# Patient Record
Sex: Female | Born: 1945 | Race: White | Hispanic: Yes | Marital: Married | Smoking: Former smoker
Health system: Southern US, Community
[De-identification: ages and names within clinical notes are randomized; demographics above are authoritative.]

## PROBLEM LIST (undated history)

## (undated) DIAGNOSIS — S92902A Unspecified fracture of left foot, initial encounter for closed fracture: Secondary | ICD-10-CM

## (undated) DIAGNOSIS — E785 Hyperlipidemia, unspecified: Secondary | ICD-10-CM

## (undated) DIAGNOSIS — M1990 Unspecified osteoarthritis, unspecified site: Secondary | ICD-10-CM

## (undated) DIAGNOSIS — K900 Celiac disease: Secondary | ICD-10-CM

## (undated) DIAGNOSIS — M8580 Other specified disorders of bone density and structure, unspecified site: Secondary | ICD-10-CM

## (undated) DIAGNOSIS — K219 Gastro-esophageal reflux disease without esophagitis: Secondary | ICD-10-CM

## (undated) DIAGNOSIS — I739 Peripheral vascular disease, unspecified: Secondary | ICD-10-CM

## (undated) DIAGNOSIS — B009 Herpesviral infection, unspecified: Secondary | ICD-10-CM

## (undated) DIAGNOSIS — I10 Essential (primary) hypertension: Secondary | ICD-10-CM

## (undated) DIAGNOSIS — R7303 Prediabetes: Secondary | ICD-10-CM

## (undated) DIAGNOSIS — F419 Anxiety disorder, unspecified: Secondary | ICD-10-CM

## (undated) DIAGNOSIS — I2510 Atherosclerotic heart disease of native coronary artery without angina pectoris: Secondary | ICD-10-CM

## (undated) DIAGNOSIS — B029 Zoster without complications: Secondary | ICD-10-CM

## (undated) HISTORY — DX: Gastro-esophageal reflux disease without esophagitis: K21.9

## (undated) HISTORY — DX: Celiac disease: K90.0

## (undated) HISTORY — DX: Essential (primary) hypertension: I10

## (undated) HISTORY — PX: ESOPHAGOGASTRODUODENOSCOPY: SHX1529

## (undated) HISTORY — DX: Unspecified fracture of left foot, initial encounter for closed fracture: S92.902A

## (undated) HISTORY — DX: Hyperlipidemia, unspecified: E78.5

## (undated) HISTORY — PX: BREAST SURGERY: SHX581

## (undated) HISTORY — DX: Anxiety disorder, unspecified: F41.9

## (undated) HISTORY — DX: Atherosclerotic heart disease of native coronary artery without angina pectoris: I25.10

## (undated) HISTORY — PX: ROTATOR CUFF REPAIR: SHX139

## (undated) HISTORY — PX: APPENDECTOMY: SHX54

## (undated) HISTORY — DX: Herpesviral infection, unspecified: B00.9

## (undated) HISTORY — DX: Peripheral vascular disease, unspecified: I73.9

## (undated) HISTORY — DX: Other specified disorders of bone density and structure, unspecified site: M85.80

## (undated) HISTORY — DX: Zoster without complications: B02.9

---

## 1991-08-30 HISTORY — PX: VAGINAL HYSTERECTOMY: SUR661

## 1998-11-17 ENCOUNTER — Other Ambulatory Visit: Admission: RE | Admit: 1998-11-17 | Discharge: 1998-11-17 | Payer: Self-pay | Admitting: Obstetrics and Gynecology

## 2000-04-18 HISTORY — PX: OTHER SURGICAL HISTORY: SHX169

## 2001-01-23 ENCOUNTER — Encounter: Admission: RE | Admit: 2001-01-23 | Discharge: 2001-01-23 | Payer: Self-pay | Admitting: Orthopedic Surgery

## 2001-01-23 ENCOUNTER — Encounter: Payer: Self-pay | Admitting: Orthopedic Surgery

## 2001-05-21 ENCOUNTER — Ambulatory Visit (HOSPITAL_COMMUNITY): Admission: RE | Admit: 2001-05-21 | Discharge: 2001-05-21 | Payer: Self-pay | Admitting: Internal Medicine

## 2001-07-13 ENCOUNTER — Encounter: Payer: Self-pay | Admitting: Orthopedic Surgery

## 2001-07-20 ENCOUNTER — Observation Stay (HOSPITAL_COMMUNITY): Admission: RE | Admit: 2001-07-20 | Discharge: 2001-07-21 | Payer: Self-pay | Admitting: Orthopedic Surgery

## 2001-11-28 ENCOUNTER — Other Ambulatory Visit: Admission: RE | Admit: 2001-11-28 | Discharge: 2001-11-28 | Payer: Self-pay | Admitting: Obstetrics and Gynecology

## 2002-10-04 ENCOUNTER — Emergency Department (HOSPITAL_COMMUNITY): Admission: EM | Admit: 2002-10-04 | Discharge: 2002-10-04 | Payer: Self-pay | Admitting: Emergency Medicine

## 2002-12-11 ENCOUNTER — Other Ambulatory Visit: Admission: RE | Admit: 2002-12-11 | Discharge: 2002-12-11 | Payer: Self-pay | Admitting: Obstetrics and Gynecology

## 2003-11-03 ENCOUNTER — Other Ambulatory Visit: Admission: RE | Admit: 2003-11-03 | Discharge: 2003-11-03 | Payer: Self-pay | Admitting: Obstetrics and Gynecology

## 2003-12-15 ENCOUNTER — Ambulatory Visit (HOSPITAL_COMMUNITY): Admission: RE | Admit: 2003-12-15 | Discharge: 2003-12-15 | Payer: Self-pay | Admitting: Internal Medicine

## 2004-07-13 ENCOUNTER — Ambulatory Visit: Payer: Self-pay

## 2004-07-19 ENCOUNTER — Ambulatory Visit: Payer: Self-pay | Admitting: Internal Medicine

## 2004-07-26 ENCOUNTER — Ambulatory Visit: Payer: Self-pay | Admitting: Internal Medicine

## 2004-07-30 ENCOUNTER — Ambulatory Visit: Payer: Self-pay | Admitting: Internal Medicine

## 2004-09-01 ENCOUNTER — Ambulatory Visit: Payer: Self-pay | Admitting: Internal Medicine

## 2004-09-22 ENCOUNTER — Ambulatory Visit: Payer: Self-pay | Admitting: Internal Medicine

## 2004-09-23 ENCOUNTER — Ambulatory Visit: Payer: Self-pay | Admitting: Internal Medicine

## 2004-09-27 ENCOUNTER — Ambulatory Visit: Payer: Self-pay | Admitting: Psychology

## 2004-10-07 ENCOUNTER — Ambulatory Visit: Payer: Self-pay | Admitting: Psychology

## 2004-10-14 ENCOUNTER — Ambulatory Visit: Payer: Self-pay | Admitting: Psychology

## 2004-10-28 ENCOUNTER — Ambulatory Visit: Payer: Self-pay | Admitting: Internal Medicine

## 2004-11-04 ENCOUNTER — Ambulatory Visit: Payer: Self-pay | Admitting: Psychology

## 2004-11-09 ENCOUNTER — Other Ambulatory Visit: Admission: RE | Admit: 2004-11-09 | Discharge: 2004-11-09 | Payer: Self-pay | Admitting: Obstetrics and Gynecology

## 2004-11-15 ENCOUNTER — Ambulatory Visit: Payer: Self-pay | Admitting: Psychology

## 2004-11-17 ENCOUNTER — Ambulatory Visit: Payer: Self-pay | Admitting: Internal Medicine

## 2004-11-26 ENCOUNTER — Ambulatory Visit: Payer: Self-pay | Admitting: Internal Medicine

## 2004-12-20 ENCOUNTER — Ambulatory Visit: Payer: Self-pay | Admitting: Psychology

## 2004-12-22 ENCOUNTER — Ambulatory Visit: Payer: Self-pay | Admitting: Internal Medicine

## 2005-01-10 ENCOUNTER — Ambulatory Visit: Payer: Self-pay

## 2005-01-20 ENCOUNTER — Ambulatory Visit: Payer: Self-pay | Admitting: Internal Medicine

## 2005-02-25 ENCOUNTER — Ambulatory Visit: Payer: Self-pay | Admitting: Internal Medicine

## 2005-03-07 ENCOUNTER — Ambulatory Visit: Payer: Self-pay | Admitting: Psychology

## 2005-03-30 ENCOUNTER — Ambulatory Visit: Payer: Self-pay | Admitting: Internal Medicine

## 2005-05-30 ENCOUNTER — Ambulatory Visit: Payer: Self-pay | Admitting: Internal Medicine

## 2005-07-04 ENCOUNTER — Ambulatory Visit: Payer: Self-pay

## 2005-07-26 ENCOUNTER — Ambulatory Visit: Payer: Self-pay | Admitting: Internal Medicine

## 2005-07-28 ENCOUNTER — Ambulatory Visit: Payer: Self-pay | Admitting: Internal Medicine

## 2005-08-01 ENCOUNTER — Ambulatory Visit: Payer: Self-pay | Admitting: Internal Medicine

## 2005-08-29 DIAGNOSIS — K900 Celiac disease: Secondary | ICD-10-CM

## 2005-08-29 HISTORY — DX: Celiac disease: K90.0

## 2005-10-31 ENCOUNTER — Ambulatory Visit: Payer: Self-pay | Admitting: Internal Medicine

## 2005-11-17 ENCOUNTER — Other Ambulatory Visit: Admission: RE | Admit: 2005-11-17 | Discharge: 2005-11-17 | Payer: Self-pay | Admitting: Obstetrics and Gynecology

## 2005-12-08 ENCOUNTER — Ambulatory Visit: Payer: Self-pay | Admitting: Endocrinology

## 2006-01-09 ENCOUNTER — Ambulatory Visit: Payer: Self-pay | Admitting: Internal Medicine

## 2006-01-13 ENCOUNTER — Ambulatory Visit: Payer: Self-pay | Admitting: Internal Medicine

## 2006-01-24 ENCOUNTER — Ambulatory Visit: Payer: Self-pay | Admitting: Internal Medicine

## 2006-01-25 ENCOUNTER — Ambulatory Visit (HOSPITAL_COMMUNITY): Admission: RE | Admit: 2006-01-25 | Discharge: 2006-01-25 | Payer: Self-pay | Admitting: Internal Medicine

## 2006-01-25 ENCOUNTER — Encounter: Payer: Self-pay | Admitting: Internal Medicine

## 2006-01-31 ENCOUNTER — Ambulatory Visit: Payer: Self-pay | Admitting: Internal Medicine

## 2006-02-22 ENCOUNTER — Ambulatory Visit: Payer: Self-pay | Admitting: Internal Medicine

## 2006-03-03 ENCOUNTER — Ambulatory Visit: Payer: Self-pay | Admitting: Endocrinology

## 2006-04-13 ENCOUNTER — Ambulatory Visit: Payer: Self-pay | Admitting: Internal Medicine

## 2006-04-19 ENCOUNTER — Ambulatory Visit: Payer: Self-pay | Admitting: Internal Medicine

## 2006-04-21 ENCOUNTER — Encounter: Payer: Self-pay | Admitting: Internal Medicine

## 2006-04-21 ENCOUNTER — Ambulatory Visit (HOSPITAL_COMMUNITY): Admission: RE | Admit: 2006-04-21 | Discharge: 2006-04-21 | Payer: Self-pay | Admitting: Internal Medicine

## 2006-05-10 ENCOUNTER — Ambulatory Visit: Payer: Self-pay | Admitting: Internal Medicine

## 2006-05-15 ENCOUNTER — Ambulatory Visit: Payer: Self-pay | Admitting: Internal Medicine

## 2006-05-29 ENCOUNTER — Ambulatory Visit: Payer: Self-pay | Admitting: Internal Medicine

## 2006-06-13 ENCOUNTER — Ambulatory Visit: Payer: Self-pay

## 2006-08-02 ENCOUNTER — Ambulatory Visit: Payer: Self-pay | Admitting: Internal Medicine

## 2006-08-02 LAB — CONVERTED CEMR LAB
Alkaline Phosphatase: 79 units/L (ref 39–117)
Amylase: 134 units/L — ABNORMAL HIGH (ref 27–131)
BUN: 13 mg/dL (ref 6–23)
CO2: 30 meq/L (ref 19–32)
Calcium: 9.7 mg/dL (ref 8.4–10.5)
Chloride: 105 meq/L (ref 96–112)
Cholesterol: 253 mg/dL (ref 0–200)
Creatinine, Ser: 0.9 mg/dL (ref 0.4–1.2)
GFR calc non Af Amer: 68 mL/min
HDL: 57.3 mg/dL (ref >39.0)
Hgb A1c MFr Bld: 5.7 % (ref 4.6–6.0)
TSH: 1.48 microintl units/mL (ref 0.35–5.50)
Total Protein: 7 g/dL (ref 6.0–8.3)
Triglyceride fasting, serum: 131 mg/dL (ref 0–149)

## 2006-08-07 ENCOUNTER — Ambulatory Visit: Payer: Self-pay | Admitting: Internal Medicine

## 2006-09-15 ENCOUNTER — Ambulatory Visit: Payer: Self-pay | Admitting: Internal Medicine

## 2006-10-27 ENCOUNTER — Ambulatory Visit: Payer: Self-pay | Admitting: Internal Medicine

## 2006-10-27 LAB — CONVERTED CEMR LAB
BUN: 13 mg/dL (ref 6–23)
Creatinine, Ser: 0.7 mg/dL (ref 0.4–1.2)
Glucose, Bld: 94 mg/dL (ref 70–99)
Potassium: 4.4 meq/L (ref 3.5–5.1)
Total CHOL/HDL Ratio: 3.6

## 2006-11-06 ENCOUNTER — Ambulatory Visit: Payer: Self-pay | Admitting: Internal Medicine

## 2006-11-20 ENCOUNTER — Other Ambulatory Visit: Admission: RE | Admit: 2006-11-20 | Discharge: 2006-11-20 | Payer: Self-pay | Admitting: Obstetrics and Gynecology

## 2006-11-24 ENCOUNTER — Ambulatory Visit: Payer: Self-pay | Admitting: Internal Medicine

## 2006-12-13 ENCOUNTER — Ambulatory Visit: Payer: Self-pay | Admitting: Internal Medicine

## 2007-01-24 ENCOUNTER — Ambulatory Visit: Payer: Self-pay | Admitting: Internal Medicine

## 2007-02-01 ENCOUNTER — Ambulatory Visit: Payer: Self-pay | Admitting: Internal Medicine

## 2007-02-01 LAB — CONVERTED CEMR LAB
AST: 35 units/L (ref 0–37)
Albumin: 3.6 g/dL (ref 3.5–5.2)
Amylase: 128 units/L (ref 27–131)
Glucose, Bld: 97 mg/dL (ref 70–99)
Lipase: 26 units/L (ref 11.0–59.0)
Total CHOL/HDL Ratio: 4.8
Triglycerides: 124 mg/dL (ref 0–149)
VLDL: 25 mg/dL (ref 0–40)
Vit D, 1,25-Dihydroxy: 50 (ref 20–57)

## 2007-02-05 ENCOUNTER — Ambulatory Visit: Payer: Self-pay | Admitting: Internal Medicine

## 2007-02-06 ENCOUNTER — Ambulatory Visit: Payer: Self-pay | Admitting: Internal Medicine

## 2007-02-12 ENCOUNTER — Encounter: Payer: Self-pay | Admitting: Internal Medicine

## 2007-02-12 ENCOUNTER — Ambulatory Visit (HOSPITAL_COMMUNITY): Admission: RE | Admit: 2007-02-12 | Discharge: 2007-02-12 | Payer: Self-pay | Admitting: Internal Medicine

## 2007-02-12 LAB — HM COLONOSCOPY

## 2007-02-19 ENCOUNTER — Ambulatory Visit: Payer: Self-pay | Admitting: Internal Medicine

## 2007-02-23 ENCOUNTER — Ambulatory Visit: Payer: Self-pay | Admitting: Internal Medicine

## 2007-03-24 ENCOUNTER — Encounter: Payer: Self-pay | Admitting: Internal Medicine

## 2007-03-24 DIAGNOSIS — I10 Essential (primary) hypertension: Secondary | ICD-10-CM

## 2007-04-15 ENCOUNTER — Emergency Department (HOSPITAL_COMMUNITY): Admission: EM | Admit: 2007-04-15 | Discharge: 2007-04-15 | Payer: Self-pay | Admitting: Emergency Medicine

## 2007-04-21 ENCOUNTER — Ambulatory Visit: Payer: Self-pay | Admitting: Internal Medicine

## 2007-05-11 ENCOUNTER — Ambulatory Visit: Payer: Self-pay | Admitting: Internal Medicine

## 2007-05-11 LAB — CONVERTED CEMR LAB
ALT: 42 units/L — ABNORMAL HIGH (ref 0–35)
Alkaline Phosphatase: 104 units/L (ref 39–117)
Total Bilirubin: 0.7 mg/dL (ref 0.3–1.2)
Total Protein: 6.9 g/dL (ref 6.0–8.3)

## 2007-05-16 ENCOUNTER — Ambulatory Visit: Payer: Self-pay | Admitting: Internal Medicine

## 2007-07-20 ENCOUNTER — Telehealth: Payer: Self-pay | Admitting: Internal Medicine

## 2007-08-08 ENCOUNTER — Ambulatory Visit: Payer: Self-pay | Admitting: Internal Medicine

## 2007-08-08 LAB — CONVERTED CEMR LAB
ALT: 21 units/L (ref 0–35)
Direct LDL: 136 mg/dL
VLDL: 23 mg/dL (ref 0–40)

## 2007-08-15 ENCOUNTER — Ambulatory Visit: Payer: Self-pay | Admitting: Internal Medicine

## 2007-08-15 DIAGNOSIS — I739 Peripheral vascular disease, unspecified: Secondary | ICD-10-CM

## 2007-08-15 DIAGNOSIS — E559 Vitamin D deficiency, unspecified: Secondary | ICD-10-CM

## 2007-09-26 ENCOUNTER — Encounter: Payer: Self-pay | Admitting: Internal Medicine

## 2007-11-21 ENCOUNTER — Other Ambulatory Visit: Admission: RE | Admit: 2007-11-21 | Discharge: 2007-11-21 | Payer: Self-pay | Admitting: Obstetrics and Gynecology

## 2007-12-06 ENCOUNTER — Ambulatory Visit: Payer: Self-pay | Admitting: Internal Medicine

## 2007-12-06 LAB — CONVERTED CEMR LAB
BUN: 14 mg/dL (ref 6–23)
Chloride: 108 meq/L (ref 96–112)
Cholesterol: 163 mg/dL (ref 0–200)
GFR calc non Af Amer: 78 mL/min
Glucose, Bld: 105 mg/dL — ABNORMAL HIGH (ref 70–99)
LDL Cholesterol: 92 mg/dL (ref 0–99)
Potassium: 4.2 meq/L (ref 3.5–5.1)

## 2007-12-11 ENCOUNTER — Ambulatory Visit: Payer: Self-pay | Admitting: Internal Medicine

## 2007-12-11 DIAGNOSIS — J309 Allergic rhinitis, unspecified: Secondary | ICD-10-CM

## 2008-02-12 ENCOUNTER — Telehealth: Payer: Self-pay | Admitting: Internal Medicine

## 2008-03-28 ENCOUNTER — Ambulatory Visit: Payer: Self-pay | Admitting: Internal Medicine

## 2008-03-28 LAB — CONVERTED CEMR LAB
Albumin: 4.1 g/dL (ref 3.5–5.2)
Basophils Absolute: 0 10*3/uL (ref 0.0–0.1)
Bilirubin Urine: NEGATIVE
Bilirubin, Direct: 0.1 mg/dL (ref 0.0–0.3)
Calcium: 9.8 mg/dL (ref 8.4–10.5)
Creatinine, Ser: 0.9 mg/dL (ref 0.4–1.2)
Eosinophils Absolute: 0.1 10*3/uL (ref 0.0–0.7)
GFR calc Af Amer: 82 mL/min
GFR calc non Af Amer: 68 mL/min
HCT: 40.9 % (ref 36.0–46.0)
HDL: 53.5 mg/dL (ref >39.0)
Hemoglobin: 14 g/dL (ref 12.0–15.0)
Ketones, ur: NEGATIVE mg/dL
Leukocytes, UA: NEGATIVE
Lymphocytes Relative: 42.1 % (ref 12.0–46.0)
MCHC: 34.2 g/dL (ref 30.0–36.0)
MCV: 89.7 fL (ref 78.0–100.0)
Monocytes Absolute: 0.3 10*3/uL (ref 0.1–1.0)
Neutro Abs: 2 10*3/uL (ref 1.4–7.7)
RDW: 12.4 % (ref 11.5–14.6)
Sodium: 141 meq/L (ref 135–145)
Total Bilirubin: 0.8 mg/dL (ref 0.3–1.2)
Total CHOL/HDL Ratio: 3.3
Total Protein: 6.9 g/dL (ref 6.0–8.3)
Triglycerides: 108 mg/dL (ref 0–149)
Urine Glucose: NEGATIVE mg/dL
pH: 7 (ref 5.0–8.0)

## 2008-04-02 ENCOUNTER — Ambulatory Visit: Payer: Self-pay | Admitting: Internal Medicine

## 2008-04-02 DIAGNOSIS — K219 Gastro-esophageal reflux disease without esophagitis: Secondary | ICD-10-CM

## 2008-04-23 ENCOUNTER — Ambulatory Visit: Payer: Self-pay | Admitting: Internal Medicine

## 2008-04-23 DIAGNOSIS — E785 Hyperlipidemia, unspecified: Secondary | ICD-10-CM | POA: Insufficient documentation

## 2008-04-23 DIAGNOSIS — Z860100 Personal history of colon polyps, unspecified: Secondary | ICD-10-CM | POA: Insufficient documentation

## 2008-04-23 DIAGNOSIS — K5730 Diverticulosis of large intestine without perforation or abscess without bleeding: Secondary | ICD-10-CM | POA: Insufficient documentation

## 2008-04-23 DIAGNOSIS — F411 Generalized anxiety disorder: Secondary | ICD-10-CM

## 2008-05-13 ENCOUNTER — Ambulatory Visit: Payer: Self-pay | Admitting: Obstetrics and Gynecology

## 2008-06-02 ENCOUNTER — Ambulatory Visit: Payer: Self-pay | Admitting: Internal Medicine

## 2008-06-02 DIAGNOSIS — R21 Rash and other nonspecific skin eruption: Secondary | ICD-10-CM

## 2008-06-03 ENCOUNTER — Encounter: Payer: Self-pay | Admitting: Internal Medicine

## 2008-06-09 ENCOUNTER — Telehealth: Payer: Self-pay | Admitting: Internal Medicine

## 2008-06-27 DIAGNOSIS — Z8719 Personal history of other diseases of the digestive system: Secondary | ICD-10-CM

## 2008-06-27 DIAGNOSIS — K589 Irritable bowel syndrome without diarrhea: Secondary | ICD-10-CM | POA: Insufficient documentation

## 2008-07-02 ENCOUNTER — Ambulatory Visit: Payer: Self-pay | Admitting: Internal Medicine

## 2008-07-09 ENCOUNTER — Ambulatory Visit: Payer: Self-pay | Admitting: Internal Medicine

## 2008-09-01 ENCOUNTER — Telehealth: Payer: Self-pay | Admitting: Internal Medicine

## 2008-09-11 ENCOUNTER — Telehealth: Payer: Self-pay | Admitting: Internal Medicine

## 2008-09-26 ENCOUNTER — Telehealth: Payer: Self-pay | Admitting: Internal Medicine

## 2008-10-03 ENCOUNTER — Telehealth: Payer: Self-pay | Admitting: Internal Medicine

## 2008-10-14 ENCOUNTER — Encounter: Payer: Self-pay | Admitting: Internal Medicine

## 2008-11-05 ENCOUNTER — Ambulatory Visit: Payer: Self-pay | Admitting: Internal Medicine

## 2008-11-05 DIAGNOSIS — M545 Low back pain: Secondary | ICD-10-CM

## 2008-11-05 LAB — CONVERTED CEMR LAB
Albumin: 4.2 g/dL (ref 3.5–5.2)
BUN: 11 mg/dL (ref 6–23)
Basophils Absolute: 0 10*3/uL (ref 0.0–0.1)
Basophils Relative: 0.7 % (ref 0.0–3.0)
Bilirubin Urine: NEGATIVE
Creatinine, Ser: 0.7 mg/dL (ref 0.4–1.2)
Crystals: NEGATIVE
Eosinophils Absolute: 0.1 10*3/uL (ref 0.0–0.7)
GFR calc Af Amer: 109 mL/min
GFR calc non Af Amer: 90 mL/min
Ketones, ur: NEGATIVE mg/dL
Ketones, urine, test strip: NEGATIVE
Leukocytes, UA: NEGATIVE
MCHC: 34.6 g/dL (ref 30.0–36.0)
MCV: 89 fL (ref 78.0–100.0)
Neutro Abs: 2.4 10*3/uL (ref 1.4–7.7)
Neutrophils Relative %: 47.1 % (ref 43.0–77.0)
Nitrite: NEGATIVE
RBC: 4.51 M/uL (ref 3.87–5.11)
Sed Rate: 11 mm/hr (ref 0–22)
Specific Gravity, Urine: <=1.005 (ref 1.000–1.035)
Urobilinogen, UA: 0.2
Urobilinogen, UA: 0.2 (ref 0.0–1.0)
WBC Urine, dipstick: NEGATIVE

## 2008-11-06 ENCOUNTER — Encounter: Payer: Self-pay | Admitting: Internal Medicine

## 2008-11-11 ENCOUNTER — Telehealth: Payer: Self-pay | Admitting: Internal Medicine

## 2008-11-27 ENCOUNTER — Ambulatory Visit: Payer: Self-pay | Admitting: Internal Medicine

## 2008-12-23 ENCOUNTER — Ambulatory Visit: Payer: Self-pay | Admitting: Obstetrics and Gynecology

## 2008-12-23 ENCOUNTER — Encounter: Payer: Self-pay | Admitting: Obstetrics and Gynecology

## 2008-12-23 ENCOUNTER — Other Ambulatory Visit: Admission: RE | Admit: 2008-12-23 | Discharge: 2008-12-23 | Payer: Self-pay | Admitting: Obstetrics and Gynecology

## 2009-01-14 ENCOUNTER — Telehealth: Payer: Self-pay | Admitting: Internal Medicine

## 2009-01-15 ENCOUNTER — Ambulatory Visit: Payer: Self-pay | Admitting: Endocrinology

## 2009-03-06 ENCOUNTER — Ambulatory Visit: Payer: Self-pay | Admitting: Internal Medicine

## 2009-03-06 LAB — CONVERTED CEMR LAB
Albumin: 4.1 g/dL (ref 3.5–5.2)
BUN: 14 mg/dL (ref 6–23)
Basophils Relative: 0.1 % (ref 0.0–3.0)
Bilirubin Urine: NEGATIVE
Bilirubin, Direct: 0.1 mg/dL (ref 0.0–0.3)
Cholesterol: 170 mg/dL (ref 0–200)
Eosinophils Absolute: 0.1 10*3/uL (ref 0.0–0.7)
GFR calc non Af Amer: 67.35 mL/min (ref >60)
HCT: 40.1 % (ref 36.0–46.0)
HDL: 55.1 mg/dL (ref >39.00)
Leukocytes, UA: NEGATIVE
Lymphs Abs: 2 10*3/uL (ref 0.7–4.0)
MCHC: 33.9 g/dL (ref 30.0–36.0)
MCV: 90.9 fL (ref 78.0–100.0)
Monocytes Absolute: 0.4 10*3/uL (ref 0.1–1.0)
Neutro Abs: 1.9 10*3/uL (ref 1.4–7.7)
Neutrophils Relative %: 43.3 % (ref 43.0–77.0)
Nitrite: NEGATIVE
Potassium: 4.3 meq/L (ref 3.5–5.1)
RBC: 4.41 M/uL (ref 3.87–5.11)
Sodium: 144 meq/L (ref 135–145)
TSH: 1.65 microintl units/mL (ref 0.35–5.50)
Total Protein: 7 g/dL (ref 6.0–8.3)
Urobilinogen, UA: 0.2 (ref 0.0–1.0)
VLDL: 25 mg/dL (ref 0.0–40.0)

## 2009-03-12 ENCOUNTER — Ambulatory Visit: Payer: Self-pay | Admitting: Internal Medicine

## 2009-04-13 ENCOUNTER — Telehealth: Payer: Self-pay | Admitting: Internal Medicine

## 2009-04-27 ENCOUNTER — Telehealth: Payer: Self-pay | Admitting: Internal Medicine

## 2009-04-28 ENCOUNTER — Ambulatory Visit: Payer: Self-pay | Admitting: Internal Medicine

## 2009-05-26 ENCOUNTER — Ambulatory Visit: Payer: Self-pay | Admitting: Internal Medicine

## 2009-05-26 DIAGNOSIS — M2550 Pain in unspecified joint: Secondary | ICD-10-CM

## 2009-07-28 ENCOUNTER — Ambulatory Visit: Payer: Self-pay | Admitting: Internal Medicine

## 2009-07-28 DIAGNOSIS — Z87891 Personal history of nicotine dependence: Secondary | ICD-10-CM

## 2009-07-28 DIAGNOSIS — R42 Dizziness and giddiness: Secondary | ICD-10-CM

## 2009-08-29 DIAGNOSIS — S92902A Unspecified fracture of left foot, initial encounter for closed fracture: Secondary | ICD-10-CM

## 2009-08-29 HISTORY — DX: Unspecified fracture of left foot, initial encounter for closed fracture: S92.902A

## 2009-09-15 ENCOUNTER — Ambulatory Visit: Payer: Self-pay | Admitting: Internal Medicine

## 2009-09-15 LAB — CONVERTED CEMR LAB
Alkaline Phosphatase: 65 units/L (ref 39–117)
Basophils Absolute: 0 10*3/uL (ref 0.0–0.1)
Bilirubin, Direct: 0.1 mg/dL (ref 0.0–0.3)
Calcium: 9.6 mg/dL (ref 8.4–10.5)
Creatinine, Ser: 0.8 mg/dL (ref 0.4–1.2)
Eosinophils Relative: 1.4 % (ref 0.0–5.0)
GFR calc non Af Amer: 77.02 mL/min (ref >60)
HCT: 41.1 % (ref 36.0–46.0)
HDL: 56.4 mg/dL (ref >39.00)
Lymphs Abs: 1.8 10*3/uL (ref 0.7–4.0)
MCV: 91.8 fL (ref 78.0–100.0)
Monocytes Absolute: 0.4 10*3/uL (ref 0.1–1.0)
Monocytes Relative: 9.3 % (ref 3.0–12.0)
Neutrophils Relative %: 41.5 % — ABNORMAL LOW (ref 43.0–77.0)
Platelets: 244 10*3/uL (ref 150.0–400.0)
RDW: 12.2 % (ref 11.5–14.6)
Sodium: 143 meq/L (ref 135–145)
Total CHOL/HDL Ratio: 3
Triglycerides: 125 mg/dL (ref 0.0–149.0)
VLDL: 25 mg/dL (ref 0.0–40.0)
WBC: 3.9 10*3/uL — ABNORMAL LOW (ref 4.5–10.5)

## 2009-09-21 ENCOUNTER — Ambulatory Visit: Payer: Self-pay | Admitting: Internal Medicine

## 2009-09-22 ENCOUNTER — Telehealth: Payer: Self-pay | Admitting: Internal Medicine

## 2009-10-19 ENCOUNTER — Telehealth: Payer: Self-pay | Admitting: Internal Medicine

## 2009-10-21 ENCOUNTER — Encounter: Payer: Self-pay | Admitting: Internal Medicine

## 2009-10-23 ENCOUNTER — Telehealth: Payer: Self-pay | Admitting: Internal Medicine

## 2009-10-26 ENCOUNTER — Telehealth: Payer: Self-pay | Admitting: Internal Medicine

## 2009-10-27 ENCOUNTER — Ambulatory Visit: Payer: Self-pay | Admitting: Internal Medicine

## 2009-10-27 DIAGNOSIS — R197 Diarrhea, unspecified: Secondary | ICD-10-CM

## 2009-10-27 LAB — CONVERTED CEMR LAB
ALT: 22 units/L (ref 0–35)
AST: 23 units/L (ref 0–37)
Albumin: 4.2 g/dL (ref 3.5–5.2)
Alkaline Phosphatase: 74 units/L (ref 39–117)
Basophils Absolute: 0 10*3/uL (ref 0.0–0.1)
Basophils Relative: 0.3 % (ref 0.0–3.0)
Calcium: 9.5 mg/dL (ref 8.4–10.5)
Eosinophils Relative: 0.9 % (ref 0.0–5.0)
GFR calc non Af Amer: 89.82 mL/min (ref >60)
Glucose, Bld: 100 mg/dL — ABNORMAL HIGH (ref 70–99)
HCT: 41 % (ref 36.0–46.0)
Hemoglobin: 13.4 g/dL (ref 12.0–15.0)
Lymphocytes Relative: 29.7 % (ref 12.0–46.0)
Lymphs Abs: 1.2 10*3/uL (ref 0.7–4.0)
Monocytes Relative: 9.9 % (ref 3.0–12.0)
Neutro Abs: 2.6 10*3/uL (ref 1.4–7.7)
Potassium: 3.5 meq/L (ref 3.5–5.1)
RBC: 4.46 M/uL (ref 3.87–5.11)
Sed Rate: 12 mm/hr (ref 0–22)
Sodium: 138 meq/L (ref 135–145)
Total Protein: 7.7 g/dL (ref 6.0–8.3)

## 2009-10-28 ENCOUNTER — Telehealth: Payer: Self-pay | Admitting: Internal Medicine

## 2009-11-20 ENCOUNTER — Telehealth: Payer: Self-pay | Admitting: Internal Medicine

## 2009-11-23 ENCOUNTER — Ambulatory Visit: Payer: Self-pay | Admitting: Internal Medicine

## 2009-12-09 ENCOUNTER — Encounter: Payer: Self-pay | Admitting: Internal Medicine

## 2009-12-16 ENCOUNTER — Telehealth: Payer: Self-pay | Admitting: Internal Medicine

## 2009-12-24 ENCOUNTER — Other Ambulatory Visit: Admission: RE | Admit: 2009-12-24 | Discharge: 2009-12-24 | Payer: Self-pay | Admitting: Obstetrics and Gynecology

## 2009-12-24 ENCOUNTER — Ambulatory Visit: Payer: Self-pay | Admitting: Obstetrics and Gynecology

## 2010-03-16 ENCOUNTER — Ambulatory Visit: Payer: Self-pay | Admitting: Internal Medicine

## 2010-03-16 LAB — CONVERTED CEMR LAB
ALT: 23 units/L (ref 0–35)
Alkaline Phosphatase: 81 units/L (ref 39–117)
BUN: 16 mg/dL (ref 6–23)
Bilirubin, Direct: 0.1 mg/dL (ref 0.0–0.3)
Creatinine, Ser: 0.7 mg/dL (ref 0.4–1.2)
GFR calc non Af Amer: 88.25 mL/min (ref >60)
Glucose, Bld: 96 mg/dL (ref 70–99)
Potassium: 3.8 meq/L (ref 3.5–5.1)
Total Bilirubin: 0.5 mg/dL (ref 0.3–1.2)
VLDL: 26.4 mg/dL (ref 0.0–40.0)

## 2010-03-23 ENCOUNTER — Ambulatory Visit: Payer: Self-pay | Admitting: Internal Medicine

## 2010-03-23 DIAGNOSIS — M79609 Pain in unspecified limb: Secondary | ICD-10-CM | POA: Insufficient documentation

## 2010-04-05 ENCOUNTER — Telehealth: Payer: Self-pay | Admitting: Internal Medicine

## 2010-04-07 ENCOUNTER — Telehealth: Payer: Self-pay | Admitting: Internal Medicine

## 2010-04-08 ENCOUNTER — Encounter: Payer: Self-pay | Admitting: Internal Medicine

## 2010-04-13 ENCOUNTER — Telehealth: Payer: Self-pay | Admitting: Internal Medicine

## 2010-04-28 ENCOUNTER — Telehealth: Payer: Self-pay | Admitting: Internal Medicine

## 2010-05-05 ENCOUNTER — Telehealth: Payer: Self-pay | Admitting: Internal Medicine

## 2010-05-07 ENCOUNTER — Ambulatory Visit: Payer: Self-pay | Admitting: Internal Medicine

## 2010-06-18 ENCOUNTER — Ambulatory Visit: Payer: Self-pay | Admitting: Internal Medicine

## 2010-06-18 LAB — CONVERTED CEMR LAB
BUN: 16 mg/dL (ref 6–23)
Chloride: 107 meq/L (ref 96–112)
Creatinine, Ser: 0.7 mg/dL (ref 0.4–1.2)
GFR calc non Af Amer: 92.69 mL/min (ref >60)
Glucose, Bld: 100 mg/dL — ABNORMAL HIGH (ref 70–99)
LDL Cholesterol: 94 mg/dL (ref 0–99)
Potassium: 4.8 meq/L (ref 3.5–5.1)
VLDL: 22.6 mg/dL (ref 0.0–40.0)

## 2010-06-25 ENCOUNTER — Ambulatory Visit: Payer: Self-pay | Admitting: Internal Medicine

## 2010-06-25 ENCOUNTER — Telehealth: Payer: Self-pay | Admitting: Internal Medicine

## 2010-06-25 DIAGNOSIS — M25539 Pain in unspecified wrist: Secondary | ICD-10-CM

## 2010-08-09 ENCOUNTER — Ambulatory Visit: Payer: Self-pay | Admitting: Internal Medicine

## 2010-08-16 ENCOUNTER — Telehealth: Payer: Self-pay | Admitting: Internal Medicine

## 2010-09-03 ENCOUNTER — Telehealth: Payer: Self-pay | Admitting: Internal Medicine

## 2010-09-07 ENCOUNTER — Encounter: Payer: Self-pay | Admitting: Internal Medicine

## 2010-09-07 DIAGNOSIS — I779 Disorder of arteries and arterioles, unspecified: Secondary | ICD-10-CM | POA: Insufficient documentation

## 2010-09-07 DIAGNOSIS — I6529 Occlusion and stenosis of unspecified carotid artery: Secondary | ICD-10-CM | POA: Insufficient documentation

## 2010-09-08 ENCOUNTER — Ambulatory Visit: Admission: RE | Admit: 2010-09-08 | Discharge: 2010-09-08 | Payer: Self-pay | Source: Home / Self Care

## 2010-09-08 ENCOUNTER — Encounter: Payer: Self-pay | Admitting: Internal Medicine

## 2010-09-21 ENCOUNTER — Ambulatory Visit
Admission: RE | Admit: 2010-09-21 | Discharge: 2010-09-21 | Payer: Self-pay | Source: Home / Self Care | Attending: Internal Medicine | Admitting: Internal Medicine

## 2010-09-21 DIAGNOSIS — R5381 Other malaise: Secondary | ICD-10-CM | POA: Insufficient documentation

## 2010-09-21 DIAGNOSIS — R5383 Other fatigue: Secondary | ICD-10-CM

## 2010-09-24 ENCOUNTER — Telehealth: Payer: Self-pay | Admitting: Internal Medicine

## 2010-09-28 NOTE — Assessment & Plan Note (Signed)
Summary: 3 mo rov /nws  #   Vital Signs:  Patient profile:   65 year old female Height:      59 inches Weight:      132 pounds BMI:     26.76 Temp:     98.2 degrees F oral Pulse rate:   76 / minute Pulse rhythm:   regular Resp:     16 per minute BP sitting:   130 / 76  (left arm) Cuff size:   regular  Vitals Entered By: Lanier Prude, CMA(AAMA) (June 25, 2010 9:08 AM) CC: 3 mo f/u Is Patient Diabetic? No Comments pt is not taking Protonix   Primary Care Provider:  Sula Soda, MD  CC:  3 mo f/u.  History of Present Illness: The patient presents for a follow up of hypertension, hyperlipidemia She fell and broke her R wrist   Current Medications (verified): 1)  Climara 0.025 Mg/24hr  Ptwk (Estradiol) .... Take 1 By Mouth Qd 2)  Levsin/sl 0.125 Mg Subl (Hyoscyamine Sulfate) .... Place 1 Tablet Under Tongue Every Four  Hours 3)  Gnp Safety Coated Aspirin 81 Mg  Tbec (Aspirin) .... Once Daily 4)  Clidinium-Chlordiazepoxide 2.5-5 Mg  Caps (Clidinium-Chlordiazepoxide) .... Take 1 Tablet 3 Times Per Day As Needed. 5)  Vitamin D3 1000 Unit  Tabs (Cholecalciferol) .[name].. 1  By Mouth A Week 6)  Loratadine 10 Mg  Tabs (Loratadine) .... Once Daily As Needed Allergies 7)  Fluticasone Propionate 50 Mcg/act Susp (Fluticasone Propionate) .... Use As Directed 8)  Lomotil 2.5-0.025 Mg Tabs (Diphenoxylate-Atropine) .... As Needed For Diarrhea 9)  Lorazepam 0.5 Mg Tabs (Lorazepam) .[name].. 1 By Mouth Two Times A Day As Needed Anxiety 10)  Coricidin Hbp Congestion/cough 10-200 Mg Caps (Dextromethorphan-Guaifenesin) .[name].. 1 Tab Q 4 Hrs 11)  Meclizine Hcl 12.5 Mg Tabs (Meclizine Hcl) .[name].. 1 By Mouth Two Times A Day As Needed Dizziness 12)  Creon 12000 Unit Cpep (Pancrelipase (Lip-Prot-Amyl)) .... Take 1 Tablet By Mouth Two-Three Times Daily (With Meals) 13)  Triamcinolone Acetonide 0.5 % Crea (Triamcinolone Acetonide) .... Use Two Times A Day Prn 14)  Fexofenadine Hcl 60 Mg Tabs (Fexofenadine  Hcl) .[name].. 1 By Mouth Two Times A Day As Needed For Allergies 15)  Crestor 5 Mg Tabs (Rosuvastatin Calcium) 16)  Voltaren 1 % Gel (Diclofenac Sodium) .... Use Two Times A Day On A Joint(S) 17)  Flector 1.3 % Ptch (Diclofenac Epolamine) .[name].. 1-2 Patches Once Daily For Pain 18)  Benicar 20 Mg Tabs (Olmesartan Medoxomil) .[name].. 1 Once Daily 19)  Protonix 40 Mg Tbec (Pantoprazole Sodium) .... One Tablet By Mouth Once Daily 20)  Benicar 20 Mg Tabs (Olmesartan Medoxomil) .... One Tablet By Mouth Once Daily  Allergies (verified): 1)  ! * Asprin 2)  ! Codeine 3)  ! * Altace 4)  ! * Actonel 5)  ! * Ivp Dye 6)  ! Kapidex 7)  Biaxin 8)  Protonix (Pantoprazole Sodium) 9)  Nexium 10)  Losartan Potassium (Losartan Potassium)  Past History:  Past Medical History: Last updated: 03/23/2010 Hypertension CVD Vitamin D def. 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    Physical Exam  General:  alert, well-developed, well-nourished, well-hydrated, appropriate dress, normal appearance, healthy-appearing, cooperative to examination, and good hygiene.   Nose:  External nasal examination shows inflammation. White mucus Mouth:  Oral mucosa and oropharynx without lesions or exudates.  Teeth in good repair. Lungs:  Normal respiratory effort, chest expands symmetrically. Lungs are clear to auscultation, no crackles or wheezes. Heart:   Normal rate and regular rhythm. S1 and S2 normal without gallop, murmur, click, rub or other extra sounds. Abdomen:  soft, non-tender, normal bowel sounds, no distention, no masses, no guarding, no rigidity, no rebound tenderness, no abdominal hernia, no hepatomegaly, and no splenomegaly.   Msk:  R wrist is in a splint Extremities:  No clubbing, cyanosis, edema, or deformity noted with normal full range of motion of all joints.   Neurologic:  No cranial nerve deficits noted. Station and gait are normal. Plantar reflexes are down-going bilaterally. DTRs are symmetrical throughout. Sensory, motor and coordinative functions appear intact. Skin:  Intact without suspicious lesions or rashes Psych:  Cognition and judgment appear intact. Alert and cooperative with normal attention span and concentration. No apparent delusions, illusions, hallucinations   Impression & Recommendations:  Problem # 1:  WRIST PAIN (ICD-719.43) R fx Assessment New BDS is pending w/Dr Eda Paschal  Problem # 2:  HYPERTENSION (ICD-401.9) Assessment: Unchanged  Her updated medication list for this problem includes:    Benicar 20 Mg Tabs (Olmesartan medoxomil) .[name]... 1 once daily    Benicar 20 Mg Tabs (Olmesartan medoxomil) ..... One tablet by mouth once daily  BP today: 130/76 Prior BP: 124/72 (05/07/2010)  Prior 10 Yr Risk Heart Disease: 15 % (11/05/2008)  Labs Reviewed: K+: 4.8 (06/18/2010) Creat: : 0.7 (06/18/2010)   Chol: 171 (06/18/2010)   HDL: 54.90 (06/18/2010)   LDL: 94 (06/18/2010)   TG: 113.0 (06/18/2010)  Problem # 3:  ANXIETY (ICD-300.00) Assessment: Unchanged  Her updated medication list for this problem includes:    Lorazepam 0.5 Mg Tabs (Lorazepam) .[name]... 1 by mouth two times a day as needed anxiety  Problem # 4:  HYPERLIPIDEMIA (ICD-272.4) Assessment: Unchanged  Her updated medication list for this problem includes:    Crestor 5 Mg Tabs (Rosuvastatin calcium)  Complete Medication List: 1)   Climara 0.025 Mg/24hr Ptwk (Estradiol) .... Take 1 by mouth qd 2)  Levsin/sl 0.125 Mg Subl (Hyoscyamine sulfate) .... Place 1 tablet under tongue every four  hours 3)  Gnp Safety Coated Aspirin 81 Mg Tbec (Aspirin) .... Once daily 4)  Clidinium-chlordiazepoxide 2.5-5 Mg Caps (Clidinium-chlordiazepoxide) .... Take 1 tablet 3 times per day as needed. 5)  Vitamin D3 1000 Unit Tabs (Cholecalciferol) .[name].. 1  by mouth a week 6)  Loratadine 10 Mg Tabs (Loratadine) .... Once daily as needed allergies 7)  Fluticasone Propionate 50 Mcg/act Susp (Fluticasone propionate) .... Use as directed 8)  Lomotil 2.5-0.025 Mg Tabs (Diphenoxylate-atropine) .... As needed for diarrhea 9)  Lorazepam 0.5 Mg Tabs (Lorazepam) .[name].. 1 by mouth two times a day as needed anxiety 10)  Coricidin Hbp Congestion/cough 10-200 Mg Caps (Dextromethorphan-guaifenesin) .[name].. 1 tab q 4 hrs 11)  Meclizine Hcl 12.5 Mg Tabs (Meclizine hcl) .[name].. 1 by mouth two times a day as needed dizziness 12)  Creon 12000 Unit Cpep (Pancrelipase (lip-prot-amyl)) .... Take 1 tablet by mouth two-three times daily (with meals) 13)  Triamcinolone Acetonide 0.5 % Crea (Triamcinolone acetonide) .Marland KitchenMarland KitchenMarland Kitchen  Use two times a day prn 14)  Fexofenadine Hcl 60 Mg Tabs (Fexofenadine hcl) .[name].. 1 by mouth two times a day as needed for allergies 15)  Crestor 5 Mg Tabs (Rosuvastatin calcium) 16)  Voltaren 1 % Gel (Diclofenac sodium) .... Use two times a day on a joint(s) 17)  Flector 1.3 % Ptch (Diclofenac epolamine) .[name].. 1-2 patches once daily for pain 18)  Benicar 20 Mg Tabs (Olmesartan medoxomil) .[name].. 1 once daily 19)  Protonix 40 Mg Tbec (Pantoprazole sodium) .... One tablet by mouth once daily 20)  Benicar 20 Mg Tabs (Olmesartan medoxomil) .... One tablet by mouth once daily  Other Orders: Doppler Referral (Doppler)  Patient Instructions: 1)  Please schedule a follow-up appointment in 4 months well w/labs. Prescriptions: BENICAR 20 MG TABS (OLMESARTAN MEDOXOMIL) 1 once  daily  #90 x 3   Entered and Authorized by:   Tresa Garter MD   Signed by:   Tresa Garter MD on 06/25/2010   Method used:   Print then Give to Patient   RxID:   [id number]    Orders Added: 1)  Doppler Referral [Doppler] 2)  Est. Patient Level IV [42595]    Influenza Vaccine (to be given today)

## 2010-09-28 NOTE — Progress Notes (Signed)
Summary: CALL   Phone Note Call from Patient Call back at Work Phone [phone] Call back at [phone]   Summary of Call: Patient is requesting a call regarding recent BP medciation changes.  Initial call taken by: Lamar Sprinkles, CMA,  April 07, 2010 3:17 PM  Follow-up for Phone Call        Patient is on new generic medication. She c/o h/a that woke her up in middle of night. C/o some dizzyness first thing in the am. H/a and dizzyness are gone now. BP this am 150/75. Should she increase med? Or change? Advised patient to check BP again tonight when she returns to work and call tomorrow w/readings.  Follow-up by: Lamar Sprinkles, CMA,  April 07, 2010 5:36 PM  Additional Follow-up for Phone Call Additional follow up Details #1::        If she is taking 1/2 ok to try whol tab. If on one now - d/c and try Benicar 20 mg once daily samples Additional Follow-up by: Tresa Garter MD,  April 08, 2010 12:53 PM   New Allergies: LOSARTAN POTASSIUM (LOSARTAN POTASSIUM) Additional Follow-up for Phone Call Additional follow up Details #2::    Pt was taking one whole tab. She will pick up samples of benicar and continue to monitor her BP Follow-up by: Lamar Sprinkles, CMA,  April 08, 2010 6:49 PM  New/Updated Medications: BENICAR 20 MG TABS (OLMESARTAN MEDOXOMIL) 1 once daily New Allergies: LOSARTAN POTASSIUM (LOSARTAN POTASSIUM)Prescriptions: BENICAR 20 MG TABS (OLMESARTAN MEDOXOMIL) 1 once daily  #30 x 0   Entered by:   Lamar Sprinkles, CMA   Authorized by:   Tresa Garter MD   Signed by:   Lamar Sprinkles, CMA on 04/08/2010   Method used:   Samples Given   RxID:   [id number]

## 2010-09-28 NOTE — Assessment & Plan Note (Signed)
Summary: follow up to discuss bp rx-lb   Vital Signs:  Patient profile:   65 year old female Weight:      130 pounds Temp:     98.2 degrees F oral Pulse rate:   70 / minute BP sitting:   112 / 66  (left arm)  Vitals Entered By: Tora Perches (November 23, 2009 4:06 PM) CC: f/u Is Patient Diabetic? No   Primary Care Provider:  Sula Soda, MD  CC:  f/u.  History of Present Illness: The patient presents for a follow up of hypertension, hyperlipidemia, GERD   Preventive Screening-Counseling & Management  Alcohol-Tobacco     Smoking Status: quit  Current Medications (verified): 1)  Climara 0.025 Mg/24hr  Ptwk (Estradiol) .... Take 1 By Mouth Qd 2)  Levsin/sl 0.125 Mg Subl (Hyoscyamine Sulfate) .... Place 1 Tablet Under Tongue Every Four  Hours 3)  Gnp Safety Coated Aspirin 81 Mg  Tbec (Aspirin) .... Once Daily 4)  Clidinium-Chlordiazepoxide 2.5-5 Mg  Caps (Clidinium-Chlordiazepoxide) .... Take 1 Tablet 3 Times Per Day As Needed. 5)  Crestor 10 Mg Tabs (Rosuvastatin Calcium) .[name].. 1 Tablet By Mouth Daily 6)  Vitamin D3 1000 Unit  Tabs (Cholecalciferol) .[name].. 1  By Mouth A Week 7)  Loratadine 10 Mg  Tabs (Loratadine) .... Once Daily As Needed Allergies 8)  Fluticasone Propionate 50 Mcg/act Susp (Fluticasone Propionate) .... Use As Directed 9)  Lomotil 2.5-0.025 Mg Tabs (Diphenoxylate-Atropine) .... As Needed For Diarrhea 10)  Prilosec Otc 20 Mg Tbec (Omeprazole Magnesium) .... Use Once Daily As Needed For Break- Through Heartburn. 11)  Avapro 300 Mg Tabs (Irbesartan) .... Take 0.5 Tab Daily 12)  Lorazepam 0.5 Mg Tabs (Lorazepam) .[name].. 1 By Mouth Two Times A Day As Needed Anxiety 13)  Coricidin Hbp Congestion/cough 10-200 Mg Caps (Dextromethorphan-Guaifenesin) .[name].. 1 Tab Q 4 Hrs 14)  Meclizine Hcl 12.5 Mg Tabs (Meclizine Hcl) .[name].. 1 By Mouth Two Times A Day As Needed Dizziness 15)  Creon 12000 Unit Cpep (Pancrelipase (Lip-Prot-Amyl)) .... Take 1 Tablet By Mouth Two-Three Times Daily  (With Meals) 16)  Ranitidine Hcl 150 Mg Caps (Ranitidine Hcl) .[name].. 1 Po Bid 17)  Omeprazole 20 Mg Cpdr (Omeprazole) .... One By Mouth Daily 18)  Triamcinolone Acetonide 0.5 % Crea (Triamcinolone Acetonide) .... Use Two Times A Day Prn 19)  Hydrochlorothiazide 25 Mg Tabs (Hydrochlorothiazide) .... 1/2 Tab Daily With Avapro  Allergies: 1)  ! * Asprin 2)  ! Codeine 3)  ! * Altace 4)  ! * Actonel 5)  ! * Ivp Dye 6)  ! Kapidex 7)  Biaxin 8)  Protonix (Pantoprazole Sodium) 9)  Nexium  Past History:  Past Medical History: Last updated: 07/28/2009 Hypertension CVD Vitamin D def. 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   Nose:  External nasal examination shows inflammation. White mucus Mouth:  Oral mucosa and oropharynx without lesions or exudates.  Teeth in good repair. Lungs:  Normal respiratory effort, chest expands symmetrically. Lungs are clear to auscultation, no crackles or wheezes. Heart:  Normal rate and regular rhythm. S1 and S2 normal without gallop, murmur, click, rub or other extra sounds. Abdomen:  soft, non-tender, normal bowel sounds, no distention, no masses, no guarding, no rigidity, no rebound tenderness, no abdominal hernia, no hepatomegaly, and no splenomegaly.   Msk:  No deformity or scoliosis noted of thoracic or lumbar spine.   Extremities:  No clubbing, cyanosis, edema, or deformity noted with normal full range of motion of all joints.   Neurologic:  No cranial nerve deficits noted. Station  and gait are normal. Plantar reflexes are down-going bilaterally. DTRs are symmetrical throughout. Sensory, motor and coordinative functions appear intact.   Impression & Recommendations:  Problem # 1:  HYPERTENSION (ICD-401.9) Assessment Unchanged  The following medications were removed from the medication list:    Avapro 300 Mg Tabs (Irbesartan) .[name]... Take 0.5 tab daily Her updated medication list for this problem includes:    Hydrochlorothiazide 25 Mg Tabs (Hydrochlorothiazide) .[name]... 1/2 or 1 tab daily with avapro    Avapro 150 Mg Tabs (Irbesartan) .[name]... 1 by mouth qd  Problem # 2:  ANXIETY (ICD-300.00) Assessment: Improved  Her updated medication list for this problem includes:    Lorazepam 0.5 Mg Tabs (Lorazepam) .[name]... 1 by mouth two times a day as needed anxiety  Problem # 3:  HYPERLIPIDEMIA (ICD-272.4) Assessment: Improved  Her updated medication list for this problem includes:    Crestor 10 Mg Tabs (Rosuvastatin calcium) .[name]... 1 tablet by mouth daily  Problem # 4:  GERD (ICD-530.81) Assessment: Improved  Her updated medication list for this problem includes:    Levsin/sl 0.125 Mg Subl (Hyoscyamine sulfate) .[name]... Place 1 tablet under tongue every four  hours    Clidinium-chlordiazepoxide 2.5-5 Mg Caps (Clidinium-chlordiazepoxide) .[name]... Take 1 tablet 3 times per day as needed.    Prilosec Otc 20 Mg Tbec (Omeprazole magnesium) ..... Use once daily as needed for break- through heartburn.    Ranitidine Hcl 150 Mg Caps (Ranitidine hcl) .[name]... 1 po bid    Omeprazole 20 Mg Cpdr (Omeprazole) ..... One by mouth daily  Complete Medication List: 1)  Climara 0.025 Mg/24hr Ptwk (Estradiol) .... Take 1 by mouth qd 2)  Levsin/sl 0.125 Mg Subl (Hyoscyamine sulfate) .... Place 1 tablet under tongue every four  hours 3)  Gnp Safety Coated Aspirin 81 Mg Tbec (Aspirin) .... Once daily 4)  Clidinium-chlordiazepoxide 2.5-5 Mg Caps (Clidinium-chlordiazepoxide) .... Take 1 tablet 3 times per  day as needed. 5)  Crestor 10 Mg Tabs (Rosuvastatin calcium) .[name].. 1 tablet by mouth daily 6)  Vitamin D3 1000 Unit Tabs (Cholecalciferol) .[name].. 1  by mouth a week 7)  Loratadine 10 Mg Tabs (Loratadine) .... Once daily as needed allergies 8)  Fluticasone Propionate 50 Mcg/act Susp (Fluticasone propionate) .... Use as directed 9)  Lomotil 2.5-0.025 Mg Tabs (Diphenoxylate-atropine) .... As needed for diarrhea 10)  Prilosec Otc 20 Mg Tbec (Omeprazole magnesium) .... Use once daily as needed for break- through heartburn. 11)  Lorazepam 0.5 Mg Tabs (Lorazepam) .[name].. 1 by mouth two times a day as needed anxiety 12)  Coricidin Hbp Congestion/cough 10-200 Mg Caps (Dextromethorphan-guaifenesin) .[name].. 1 tab q 4 hrs 13)  Meclizine Hcl 12.5 Mg Tabs (Meclizine hcl) .[name].. 1 by mouth two times a day as needed dizziness 14)  Creon 12000 Unit Cpep (Pancrelipase (lip-prot-amyl)) .... Take 1 tablet by mouth two-three times daily (with meals) 15)  Ranitidine Hcl 150 Mg Caps (Ranitidine hcl) .[name].. 1 po bid 16)  Omeprazole 20 Mg Cpdr (Omeprazole) .... One by mouth daily 17)  Triamcinolone Acetonide 0.5 % Crea (Triamcinolone acetonide) .... Use two times a day prn  18)  Hydrochlorothiazide 25 Mg Tabs (Hydrochlorothiazide) .... 1/2 or 1 tab daily with avapro 19)  Avapro 150 Mg Tabs (Irbesartan) .[name].. 1 by mouth qd  Patient Instructions: 1)  Normal BP <135/85 2)  Take an extra 1/2 of HCTZ if BP is up 3)  Keep return office visit  4)  Try to eat more raw plant food, fresh and dry fruit, raw almonds, leafy vegetables, whole foods and less red meat, less animal fat. Poultry and fish is better for you than pork and beef. Avoid processed foods (canned soups, hot dogs, sausage, bacon , frozen dinners). Avoid corn syrup, high fructose syrup or aspartam and Splenda  containing drinks. Honey, Agave and Stevia are better sweeteners. Make your own  dressing with olive oil, wine vinegar, lemon juce, garlic etc. for your salads.    Prescriptions: AVAPRO 150 MG TABS (IRBESARTAN) 1 by mouth qd  #90 x 3   Entered and Authorized by:   Tresa Garter MD   Signed by:   Tresa Garter MD on 11/23/2009   Method used:   Print then Give to Patient   RxID:   [id number] HYDROCHLOROTHIAZIDE 25 MG TABS (HYDROCHLOROTHIAZIDE) 1/2 or 1 tab daily with avapro  #90 x 3   Entered and Authorized by:   Tresa Garter MD   Signed by:   Tresa Garter MD on 11/23/2009   Method used:   Print then Give to Patient   RxID:   [id number]

## 2010-09-28 NOTE — Letter (Signed)
Summary: [facility]  [facility]   Imported By: Sherian Rein 01/08/2010 08:42:34  _____________________________________________________________________  External Attachment:    Type:   Image     Comment:   External Document

## 2010-09-28 NOTE — Progress Notes (Signed)
Summary: Inj Reaction  Phone Note Call from Patient Call back at Work Phone [phone]   Summary of Call: Pt c/o redness, discomfort and swelling (approx 50 cent piece size) in the area that she had the shingles vaccine. She took advil per pharmacist advice. I advised using a cool compress and alert office if severe changes such as severe pain or swelling, drainage, or fever. She agreed. Any further advice?   Initial call taken by: Lamar Sprinkles, CMA,  September 22, 2009 3:59 PM  Follow-up for Phone Call        Use triamc cream qid Follow-up by: Tresa Garter MD,  September 22, 2009 5:36 PM  Additional Follow-up for Phone Call Additional follow up Details #1::        left mess to call office back....................Marland KitchenLamar Sprinkles, CMA  September 22, 2009 5:55 PM     Additional Follow-up for Phone Call Additional follow up Details #2::    Patient notified, she has some at home and will use that before buying the new prescription. Follow-up by: Lucious Groves,  September 23, 2009 10:11 AM  New/Updated Medications: TRIAMCINOLONE ACETONIDE 0.5 % CREA (TRIAMCINOLONE ACETONIDE) use two times a day prn Prescriptions: TRIAMCINOLONE ACETONIDE 0.5 % CREA (TRIAMCINOLONE ACETONIDE) use two times a day prn  #60 g x 3   Entered and Authorized by:   Tresa Garter MD   Signed by:   Lamar Sprinkles, CMA on 09/22/2009   Method used:   Electronically to        [facility]  [address] [id number] (retail)       [address]       [place], [place]  [address]       Ph: [phone] or [phone]       Fax: [phone]   RxID:   [id number]

## 2010-09-28 NOTE — Medication Information (Signed)
Summary: Clarification for Crestor/[facility]  Clarification for Crestor/[facility]   Imported By: Sherian Rein 04/12/2010 11:38:23  _____________________________________________________________________  External Attachment:    Type:   Image     Comment:   External Document

## 2010-09-28 NOTE — Assessment & Plan Note (Signed)
Summary: 6 MTH FU  $50--STC   Vital Signs:  Patient profile:   65 year old female Weight:      128 pounds Temp:     98.1 degrees F oral Pulse rate:   78 / minute BP sitting:   134 / 70  (left arm)  Vitals Entered By: Tora Perches (September 21, 2009 9:30 AM) CC: f/u Is Patient Diabetic? No   Primary Care Provider:  Sula Soda, MD  CC:  f/u.  History of Present Illness: The patient presents for a follow up of IBS, anxiety, depression and HTN.   Preventive Screening-Counseling & Management  Alcohol-Tobacco     Smoking Status: quit  Current Medications (verified): 1)  Climara 0.025 Mg/24hr  Ptwk (Estradiol) .... Take 1 By Mouth Qd 2)  Levsin/sl 0.125 Mg Subl (Hyoscyamine Sulfate) .... Place 1 Tablet Under Tongue Every Four  Hours 3)  Gnp Safety Coated Aspirin 81 Mg  Tbec (Aspirin) .... Once Daily 4)  Clidinium-Chlordiazepoxide 2.5-5 Mg  Caps (Clidinium-Chlordiazepoxide) .... Take 1 Tablet 3 Times Per Day As Needed. 5)  Crestor 10 Mg Tabs (Rosuvastatin Calcium) .[name].. 1 Tablet By Mouth Daily 6)  Vitamin D3 1000 Unit  Tabs (Cholecalciferol) .[name].. 1  By Mouth A Week 7)  Loratadine 10 Mg  Tabs (Loratadine) .... Once Daily As Needed Allergies 8)  Fluticasone Propionate 50 Mcg/act Susp (Fluticasone Propionate) .... Use As Directed 9)  Lomotil 2.5-0.025 Mg Tabs (Diphenoxylate-Atropine) .... As Needed For Diarrhea 10)  Prilosec Otc 20 Mg Tbec (Omeprazole Magnesium) .... Use Once Daily As Needed For Break- Through Heartburn. 11)  Avalide 150-12.5 Mg Tabs (Irbesartan-Hydrochlorothiazide) .[name].. 1 By Mouth Daily 12)  Lorazepam 0.5 Mg Tabs (Lorazepam) .[name].. 1 By Mouth Two Times A Day As Needed Anxiety 13)  Coricidin Hbp Congestion/cough 10-200 Mg Caps (Dextromethorphan-Guaifenesin) .[name].. 1 Tab Q 4 Hrs 14)  Meclizine Hcl 12.5 Mg Tabs (Meclizine Hcl) .[name].. 1 By Mouth Two Times A Day As Needed Dizziness 15)  Creon 12000 Unit Cpep (Pancrelipase (Lip-Prot-Amyl)) .... Take 1 Tablet By Mouth  Two-Three Times Daily (With Meals) 16)  Ranitidine Hcl 150 Mg Caps (Ranitidine Hcl) .[name].. 1 Po Bid 17)  Omeprazole 20 Mg Cpdr (Omeprazole) .... One By Mouth Daily  Allergies: 1)  ! * Asprin 2)  ! Codeine 3)  ! * Altace 4)  ! * Actonel 5)  ! * Ivp Dye 6)  ! Kapidex 7)  Biaxin 8)  Protonix (Pantoprazole Sodium) 9)  Nexium  Past History:  Past Medical History: Last updated: 07/28/2009 Hypertension CVD Vitamin D def. 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 dysplasia Hyperlipidemia Anxiety Celiac sprue 2007 Colonic polyps, hx of Diverticulosis, colon  Past Surgical History: Last updated: 04/23/2008 EGD (12/26/2002) Stess Cardiolite (04/18/2000) R breast Bx Hysterectomy, partial R rot cuff Appendectomy  Family History: Last updated: 05/26/2009 Family History Hypertension mother with glaucoma No FH of Colon Cancer: Family History of Heart Disease: Mother, Father M died 6 yo  Social History: Last updated: 06/27/2008 Married Former Smoker Alcohol Use - yes-occasional wine Illicit Drug Use - no  Physical Exam  General:  normal appearance.   Head:  head: no deformity eyes: no periorbital swelling, no proptosis external nose and ears are normal mouth: no lesion seen  Nose:  External nasal examination shows inflammation. White mucus Mouth:  Oral mucosa and oropharynx without lesions or exudates.  Teeth in good repair. Lungs:  Clear to auscultation bilaterally. Normal respiratory effort.  Heart:  Normal rate and regular rhythm. S1 and S2 normal without gallop, murmur, click, rub or other extra sounds. Abdomen:  soft, non-tender, normal bowel sounds, no distention, no masses, no guarding, no hepatomegaly, and no splenomegaly.   Msk:  No deformity or scoliosis noted of thoracic or lumbar spine.   Extremities:  no edema, no ulcers  Neurologic:  No cranial nerve deficits noted. Station and gait are  normal. Plantar reflexes are down-going bilaterally. DTRs are symmetrical throughout. Sensory, motor and coordinative functions appear intact. H-P (+) on R Skin:  Extensive mac-pap rash on trunk, and all 4 extr Psych:  Oriented X3, normally interactive, and good eye contact.  Sad   Impression & Recommendations:  Problem # 1:  HYPERLIPIDEMIA (ICD-272.4) Assessment Improved  Her updated medication list for this problem includes:    Crestor 10 Mg Tabs (Rosuvastatin calcium) .[name]... 1 tablet by mouth daily  Problem # 2:  HYPERLIPIDEMIA (ICD-272.4) Assessment: Comment Only  Her updated medication list for this problem includes:    Crestor 10 Mg Tabs (Rosuvastatin calcium) .[name]... 1 tablet by mouth daily  Problem # 3:  GERD (ICD-530.81) Assessment: Comment Only  Her updated medication list for this problem includes:    Levsin/sl 0.125 Mg Subl (Hyoscyamine sulfate) .[name]... Place 1 tablet under tongue every four  hours    Clidinium-chlordiazepoxide 2.5-5 Mg Caps (Clidinium-chlordiazepoxide) .[name]... Take 1 tablet 3 times per day as needed.    Prilosec Otc 20 Mg Tbec (Omeprazole magnesium) ..... Use once daily as needed for break- through heartburn.    Ranitidine Hcl 150 Mg Caps (Ranitidine hcl) .[name]... 1 po bid    Omeprazole 20 Mg Cpdr (Omeprazole) ..... One by mouth daily  Problem # 4:  ANXIETY (ICD-300.00) Assessment: Comment Only  Her updated medication list for this problem includes:    Lorazepam 0.5 Mg Tabs (Lorazepam) .[name]... 1 by mouth two times a day as needed anxiety  Complete Medication List: 1)  Climara 0.025 Mg/24hr Ptwk (Estradiol) .... Take 1 by mouth qd 2)  Levsin/sl 0.125 Mg Subl (Hyoscyamine sulfate) .... Place 1 tablet under tongue every four  hours 3)  Gnp Safety Coated Aspirin 81 Mg Tbec (Aspirin) .... Once daily 4)  Clidinium-chlordiazepoxide 2.5-5 Mg Caps (Clidinium-chlordiazepoxide) .... Take 1 tablet 3 times per day as needed. 5)  Crestor 10 Mg Tabs (Rosuvastatin calcium)  .[name].. 1 tablet by mouth daily 6)  Vitamin D3 1000 Unit Tabs (Cholecalciferol) .[name].. 1  by mouth a week 7)  Loratadine 10 Mg Tabs (Loratadine) .... Once daily as needed allergies 8)  Fluticasone Propionate 50 Mcg/act Susp (Fluticasone propionate) .... Use as directed 9)  Lomotil 2.5-0.025 Mg Tabs (Diphenoxylate-atropine) .... As needed for diarrhea 10)  Prilosec Otc 20 Mg Tbec (Omeprazole magnesium) .... Use once daily as needed for break- through heartburn. 11)  Avalide 150-12.5 Mg Tabs (Irbesartan-hydrochlorothiazide) .[name].. 1 by mouth daily 12)  Lorazepam 0.5 Mg Tabs (Lorazepam) .[name].. 1 by mouth two times a day as needed anxiety 13)  Coricidin Hbp Congestion/cough 10-200 Mg Caps (Dextromethorphan-guaifenesin) .[name].. 1 tab q 4 hrs 14)  Meclizine Hcl 12.5 Mg Tabs (Meclizine hcl) .[name].. 1 by mouth two times a day as needed dizziness 15)  Creon 12000 Unit Cpep (Pancrelipase (lip-prot-amyl)) .... Take 1 tablet by mouth two-three times daily (with meals) 16)  Ranitidine Hcl 150 Mg Caps (Ranitidine hcl) .[name].. 1 po bid 17)  Omeprazole 20 Mg Cpdr (Omeprazole) .... One by mouth daily 18)  Ciprofloxacin Hcl 500 Mg Tabs (Ciprofloxacin hcl) .Marland KitchenMarland KitchenMarland Kitchen  1 by mouth bid  Other Orders: Zoster (Shingles) Vaccine Live [phone]) Admin 1st Vaccine (60454) Admin 1st Vaccine [facility]) [phone])  Patient Instructions: 1)  Please schedule a follow-up appointment in 6 months. 2)  BMP prior to visit, ICD-9: 3)  Lipid Panel prior to visit, ICD-9: 4)  Hepatic Panel prior to visit, ICD-9:272.0 Prescriptions: CRESTOR 10 MG TABS (ROSUVASTATIN CALCIUM) 1 tablet by mouth daily  #90 Tablet x 2   Entered and Authorized by:   Tresa Garter MD   Signed by:   Tresa Garter MD on 09/21/2009   Method used:   Print then Give to Patient   RxID:   [id number] CIPROFLOXACIN HCL 500 MG TABS (CIPROFLOXACIN HCL) 1 by mouth bid  #20 x 1   Entered and Authorized by:   Tresa Garter MD   Signed by:   Tresa Garter MD on  09/21/2009   Method used:   Print then Give to Patient   RxID:   [id number]    Zostavax # 1    Vaccine Type: Zostavax    Site: right deltoid    Mfr: Merck    Dose: 0.5 ml    Route: [place]    Given by: Tora Perches    Exp. Date: 08/26/2011    Lot #: [id number]    VIS given: 06/10/05 given September 21, 2009.

## 2010-09-28 NOTE — Progress Notes (Signed)
Summary: Inflamation of stomach   Phone Note Call from Patient Call back at Work Phone [phone] Call back at OR CELL [phone]   Call For: DR.Shawan Tosh Summary of Call: Inflamation in her stomach & diarrhea for 1wk. Feels like her food is not digesting properly with a lot of gas very uncomfortable. Initial call taken by: Leanor Kail [facility],  May 05, 2010 10:22 AM  Follow-up for Phone Call        patient scheduled to see Dr Juanda Chance 05/07/10 11:30.  She is advised to change to a bland diet until her appointment. Follow-up by: Darcey Nora RN, CGRN,  May 05, 2010 11:00 AM

## 2010-09-28 NOTE — Progress Notes (Signed)
Summary: Test results  Phone Note Call from Patient   Summary of Call: Patient called in regards to diarrhea and test results. Patient is aware that she can take her Lomotil or Imodium to help with the diarrhea. Patient also requesting test results. Please advise. Initial call taken by: Lucious Groves,  October 28, 2009 9:35 AM  Follow-up for Phone Call        labs normal, stool test negative Follow-up by: Etta Grandchild MD,  October 28, 2009 9:40 AM  Additional Follow-up for Phone Call Additional follow up Details #1::        Patient notified. Additional Follow-up by: Lucious Groves,  October 28, 2009 9:47 AM

## 2010-09-28 NOTE — Progress Notes (Signed)
Summary: Rf Lorazepam  Phone Note Refill Request Message from:  Fax from Pharmacy  Refills Requested: Medication #1:  LORAZEPAM 0.5 MG TABS 1 by mouth two times a day as needed anxiety   Dosage confirmed as above?Dosage Confirmed   Supply Requested: 60   Last Refilled: 11/2008  Method Requested: Telephone to Pharmacy Next Appointment Scheduled: 06-25-10 Initial call taken by: Lanier Prude, [facility]),  April 05, 2010 1:40 PM  Follow-up for Phone Call        ok x 3  Follow-up by: Tresa Garter MD,  April 06, 2010 1:30 PM  Additional Follow-up for Phone Call Additional follow up Details #1::        Rx called to pharmacy Additional Follow-up by: Lanier Prude, [facility]),  April 06, 2010 1:38 PM    Prescriptions: LORAZEPAM 0.5 MG TABS (LORAZEPAM) 1 by mouth two times a day as needed anxiety  #60 x 3   Entered by:   Lanier Prude, [facility])   Authorized by:   Tresa Garter MD   Signed by:   Lanier Prude, CMA(AAMA) on 04/06/2010   Method used:   [facility]phoned to ...       [facility]  [address] [id number]       [address]       [place], [place]  [address]       Ph: [phone] or [phone]       Fax: [phone]   RxID:   [id number]

## 2010-09-28 NOTE — Assessment & Plan Note (Signed)
Summary: 6 MTH FU  STC   Vital Signs:  Patient profile:   65 year old female Height:      59 inches Weight:      133 pounds BMI:     26.96 O2 Sat:      98 % on Room air Temp:     98.5 degrees F oral Pulse rate:   86 / minute Pulse rhythm:   regular Resp:     16 per minute BP sitting:   140 / 88  (left arm) Cuff size:   regular  Vitals Entered By: Lanier Prude, CMA(AAMA) (March 23, 2010 9:24 AM)  O2 Flow:  Room air CC: 6 mo f/u Is Patient Diabetic? No Comments pt recently broke left foot.  C/O lingering pain    Primary Care Provider:  Sula Soda, MD  CC:  6 mo f/u.  History of Present Illness: The patient presents for a follow up of hypertension, GERD, hyperlipidemia   Current Medications (verified): 1)  Climara 0.025 Mg/24hr  Ptwk (Estradiol) .... Take 1 By Mouth Qd 2)  Levsin/sl 0.125 Mg Subl (Hyoscyamine Sulfate) .... Place 1 Tablet Under Tongue Every Four  Hours 3)  Gnp Safety Coated Aspirin 81 Mg  Tbec (Aspirin) .... Once Daily 4)  Clidinium-Chlordiazepoxide 2.5-5 Mg  Caps (Clidinium-Chlordiazepoxide) .... Take 1 Tablet 3 Times Per Day As Needed. 5)  Crestor 10 Mg Tabs (Rosuvastatin Calcium) .[name].. 1 Tablet By Mouth Daily 6)  Vitamin D3 1000 Unit  Tabs (Cholecalciferol) .[name].. 1  By Mouth A Week 7)  Loratadine 10 Mg  Tabs (Loratadine) .... Once Daily As Needed Allergies 8)  Fluticasone Propionate 50 Mcg/act Susp (Fluticasone Propionate) .... Use As Directed 9)  Lomotil 2.5-0.025 Mg Tabs (Diphenoxylate-Atropine) .... As Needed For Diarrhea 10)  Prilosec Otc 20 Mg Tbec (Omeprazole Magnesium) .... Use Once Daily As Needed For Break- Through Heartburn. 11)  Lorazepam 0.5 Mg Tabs (Lorazepam) .[name].. 1 By Mouth Two Times A Day As Needed Anxiety 12)  Coricidin Hbp Congestion/cough 10-200 Mg Caps (Dextromethorphan-Guaifenesin) .[name].. 1 Tab Q 4 Hrs 13)  Meclizine Hcl 12.5 Mg Tabs (Meclizine Hcl) .[name].. 1 By Mouth Two Times A Day As Needed Dizziness 14)  Creon 12000 Unit Cpep  (Pancrelipase (Lip-Prot-Amyl)) .... Take 1 Tablet By Mouth Two-Three Times Daily (With Meals) 15)  Ranitidine Hcl 150 Mg Caps (Ranitidine Hcl) .[name].. 1 Po Bid 16)  Omeprazole 20 Mg Cpdr (Omeprazole) .... One By Mouth Daily 17)  Triamcinolone Acetonide 0.5 % Crea (Triamcinolone Acetonide) .... Use Two Times A Day Prn 18)  Hydrochlorothiazide 25 Mg Tabs (Hydrochlorothiazide) .... 1/2 or 1 Tab Daily With Avapro 19)  Avapro 150 Mg Tabs (Irbesartan) .[name].. 1 By Mouth Qd 20)  Fexofenadine Hcl 60 Mg Tabs (Fexofenadine Hcl) .[name].. 1 By Mouth Two Times A Day As Needed For Allergies  Allergies (verified): 1)  ! * Asprin 2)  ! Codeine 3)  ! * Altace 4)  ! * Actonel 5)  ! * Ivp Dye 6)  ! Kapidex 7)  Biaxin 8)  Protonix (Pantoprazole Sodium) 9)  Nexium  Past History:  Past Surgical History: Last updated: 04/23/2008 EGD (12/26/2002) Stess Cardiolite (04/18/2000) R breast Bx Hysterectomy, partial R rot cuff Appendectomy  Social History: Last updated: 10/27/2009 Married Former Smoker Alcohol Use - no Illicit Drug Use - no Regular exercise-yes  Past Medical History: Hypertension CVD Vitamin D def. 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 dysplasia Hyperlipidemia Anxiety Celiac sprue  2007 Colonic polyps, hx of Diverticulosis, colon L foot fracture 2011  Review of Systems  The patient denies fever, chest pain, prolonged cough, and abdominal pain.         L foot pain  Physical Exam  General:  alert, well-developed, well-nourished, well-hydrated, appropriate dress, normal appearance, healthy-appearing, cooperative to examination, and good hygiene.   Nose:  External nasal examination shows inflammation. White mucus Mouth:  Oral mucosa and oropharynx without lesions or exudates.  Teeth in good repair. Neck:  supple, full ROM, no masses, no thyromegaly, no JVD, normal carotid upstroke, no carotid bruits, no cervical  lymphadenopathy, and no neck tenderness.   Lungs:  Normal respiratory effort, chest expands symmetrically. Lungs are clear to auscultation, no crackles or wheezes. Heart:  Normal rate and regular rhythm. S1 and S2 normal without gallop, murmur, click, rub or other extra sounds. Abdomen:  soft, non-tender, normal bowel sounds, no distention, no masses, no guarding, no rigidity, no rebound tenderness, no abdominal hernia, no hepatomegaly, and no splenomegaly.   Msk:  L foot is tender to palp Extremities:  No clubbing, cyanosis, edema, or deformity noted with normal full range of motion of all joints.   Neurologic:  No cranial nerve deficits noted. Station and gait are normal. Plantar reflexes are down-going bilaterally. DTRs are symmetrical throughout. Sensory, motor and coordinative functions appear intact. Skin:  Intact without suspicious lesions or rashes   Impression & Recommendations:  Problem # 1:  FOOT PAIN (ICD-729.5) L s/p fx  Assessment New  Problem # 2:  HYPERLIPIDEMIA (ICD-272.4) Assessment: Improved  The following medications were removed from the medication list:    Crestor 10 Mg Tabs (Rosuvastatin calcium) .[name]... 1 tablet by mouth daily Her updated medication list for this problem includes:    Crestor 5 Mg Tabs (Rosuvastatin calcium)  Labs Reviewed: SGOT: 22 (03/16/2010)   SGPT: 23 (03/16/2010)  Prior 10 Yr Risk Heart Disease: 15 % (11/05/2008)   HDL:59.40 (03/16/2010), 56.40 (09/15/2009)  LDL:101 (03/16/2010), 96 (16/05/9603)  Chol:187 (03/16/2010), 177 (09/15/2009)  Trig:132.0 (03/16/2010), 125.0 (09/15/2009)  Problem # 3:  ANXIETY (ICD-300.00) Assessment: Unchanged  Her updated medication list for this problem includes:    Lorazepam 0.5 Mg Tabs (Lorazepam) .[name]... 1 by mouth two times a day as needed anxiety  Problem # 4:  HYPERTENSION (ICD-401.9) Assessment: Deteriorated  Her updated medication list for this problem includes:    Hydrochlorothiazide 25 Mg Tabs  (Hydrochlorothiazide) .[name]... 1/2 or 1 tab daily with avapro    Avapro 150 Mg Tabs (Irbesartan) .[name]... 1 by mouth qd    Losartan Potassium 100 Mg Tabs (Losartan potassium) .[name]... 1 by mouth once daily for blood pressure  BP today: 140/88 Prior BP: 112/66 (11/23/2009)  Prior 10 Yr Risk Heart Disease: 15 % (11/05/2008)  Labs Reviewed: K+: 3.8 (03/16/2010) Creat: : 0.7 (03/16/2010)   Chol: 187 (03/16/2010)   HDL: 59.40 (03/16/2010)   LDL: 101 (03/16/2010)   TG: 132.0 (03/16/2010)  Complete Medication List: 1)  Climara 0.025 Mg/24hr Ptwk (Estradiol) .... Take 1 by mouth qd 2)  Levsin/sl 0.125 Mg Subl (Hyoscyamine sulfate) .... Place 1 tablet under tongue every four  hours 3)  Gnp Safety Coated Aspirin 81 Mg Tbec (Aspirin) .... Once daily 4)  Clidinium-chlordiazepoxide 2.5-5 Mg Caps (Clidinium-chlordiazepoxide) .... Take 1 tablet 3 times per day as needed. 5)  Vitamin D3 1000 Unit Tabs (Cholecalciferol) .[name].. 1  by mouth a week 6)  Loratadine 10 Mg Tabs (Loratadine) .... Once daily as needed allergies 7)  Fluticasone Propionate 50 Mcg/act Susp (Fluticasone propionate) .... Use as directed 8)  Lomotil 2.5-0.025 Mg Tabs (Diphenoxylate-atropine) .... As needed for diarrhea 9)  Prilosec Otc 20 Mg Tbec (Omeprazole magnesium) .... Use once daily as needed for break- through heartburn. 10)  Lorazepam 0.5 Mg Tabs (Lorazepam) .[name].. 1 by mouth two times a day as needed anxiety 11)  Coricidin Hbp Congestion/cough 10-200 Mg Caps (Dextromethorphan-guaifenesin) .[name].. 1 tab q 4 hrs 12)  Meclizine Hcl 12.5 Mg Tabs (Meclizine hcl) .[name].. 1 by mouth two times a day as needed dizziness 13)  Creon 12000 Unit Cpep (Pancrelipase (lip-prot-amyl)) .... Take 1 tablet by mouth two-three times daily (with meals) 14)  Ranitidine Hcl 150 Mg Caps (Ranitidine hcl) .[name].. 1 po bid 15)  Omeprazole 20 Mg Cpdr (Omeprazole) .... One by mouth daily 16)  Triamcinolone Acetonide 0.5 % Crea (Triamcinolone acetonide) .... Use two times a day  prn 17)  Hydrochlorothiazide 25 Mg Tabs (Hydrochlorothiazide) .... 1/2 or 1 tab daily with avapro 18)  Avapro 150 Mg Tabs (Irbesartan) .[name].. 1 by mouth qd 19)  Fexofenadine Hcl 60 Mg Tabs (Fexofenadine hcl) .[name].. 1 by mouth two times a day as needed for allergies 20)  Crestor 5 Mg Tabs (Rosuvastatin calcium) 21)  Voltaren 1 % Gel (Diclofenac sodium) .... Use two times a day on a joint(s) 22)  Flector 1.3 % Ptch (Diclofenac epolamine) .[name].. 1-2 patches once daily for pain 23)  Losartan Potassium 100 Mg Tabs (Losartan potassium) .[name].. 1 by mouth once daily for blood pressure  Patient Instructions: 1)  Please schedule a follow-up appointment in 3 months. 2)  BMP prior to visit, ICD-9: 3)  Lipid Panel prior to visit, ICD-9:401.1  272.0 4)  You can try Losartan in place of Avapro Prescriptions: LOSARTAN POTASSIUM 100 MG TABS (LOSARTAN POTASSIUM) 1 by mouth once daily for blood pressure  #30 x 12   Entered and Authorized by:   Tresa Garter MD   Signed by:   Tresa Garter MD on 03/23/2010   Method used:   Print then Give to Patient   RxID:   [id number] CRESTOR 5 MG TABS (ROSUVASTATIN CALCIUM)   #30 x 0   Entered and Authorized by:   Tresa Garter MD   Signed by:   Tresa Garter MD on 03/23/2010   Method used:   Print then Give to Patient   RxID:   [id number] FLECTOR 1.3 % PTCH (DICLOFENAC EPOLAMINE) 1-2 patches once daily for pain  #180 x 0   Entered and Authorized by:   Tresa Garter MD   Signed by:   Tresa Garter MD on 03/23/2010   Method used:   Print then Give to Patient   RxID:   [id number] VOLTAREN 1 % GEL (DICLOFENAC SODIUM) use two times a day on a joint(s)  #90 g x 3   Entered and Authorized by:   Tresa Garter MD   Signed by:   Tresa Garter MD on 03/23/2010   Method used:   Print then Give to Patient   RxID:   [id number] CRESTOR 5 MG TABS (ROSUVASTATIN CALCIUM)   #90 x 3   Entered and Authorized by:    Tresa Garter MD   Signed by:   Tresa Garter MD on 03/23/2010   Method used:   Print then Give to Patient   RxID:   [id number]

## 2010-09-28 NOTE — Progress Notes (Signed)
Summary: Benicar RX  Phone Note Call from Patient Call back at Work Phone [phone] Call back at [phone]   Summary of Call: Patient called for Benicar prescription. Patient notes that her BP has been going down while she is on the samples of Benicar. Patient advised that we will fax it to her pharmacy. Initial call taken by: Lucious Groves CMA,  April 28, 2010 10:04 AM  Follow-up for Phone Call        ok Follow-up by: Tresa Garter MD,  April 28, 2010 1:02 PM    Prescriptions: BENICAR 20 MG TABS (OLMESARTAN MEDOXOMIL) 1 once daily  #30 x 12   Entered and Authorized by:   Tresa Garter MD   Signed by:   Tresa Garter MD on 04/28/2010   Method used:   Electronically to        [facility] [address] (retail)       [address]       [place], [place]  [address]       Ph: [phone]       Fax: [phone]   RxID:   [id number] BENICAR 20 MG TABS (OLMESARTAN MEDOXOMIL) 1 once daily  #30 x 6   Entered by:   Lucious Groves CMA   Authorized by:   Tresa Garter MD   Signed by:   Lucious Groves CMA on 04/28/2010   Method used:   Electronically to        [facility] [address]       [address]       [place], [place]  [address]       Ph: [phone]       Fax: [phone]   RxID:   [id number]

## 2010-09-28 NOTE — Progress Notes (Signed)
Summary: BP UPDATE  Phone Note Other Incoming   Caller: pt Summary of Call: pt called reg. Avapro. I called pt and LAMOAM for pt to call back Initial call taken by: Ami Bullins CMA,  April 13, 2010 10:17 AM  Follow-up for Phone Call        Spoke w/pt. She is taking Benicar and was concerned about BP. Last BP reading was 134/74. She also spoke w/her pharmacist. They told her to give the medication more time for her body to adjust to it. She says today she feels much better and will continue to monitor BP and call office back w/any further concerns.  Follow-up by: Lamar Sprinkles, CMA,  April 14, 2010 4:08 PM  Additional Follow-up for Phone Call Additional follow up Details #1::        134/74 is good. Cont Rx  Additional Follow-up by: Tresa Garter MD,  April 15, 2010 1:40 PM

## 2010-09-28 NOTE — Progress Notes (Signed)
Summary: REFILL  Phone Note Call from Patient   Summary of Call: Patient is requesting rx for fexofenadine 60mg .  Initial call taken by: Lamar Sprinkles, CMA,  December 16, 2009 9:37 AM  Follow-up for Phone Call        OK Follow-up by: Tresa Garter MD,  December 16, 2009 1:00 PM    New/Updated Medications: FEXOFENADINE HCL 60 MG TABS (FEXOFENADINE HCL) 1 by mouth two times a day as needed for allergies Prescriptions: FEXOFENADINE HCL 60 MG TABS (FEXOFENADINE HCL) 1 by mouth two times a day as needed for allergies  #60 x 6   Entered and Authorized by:   Tresa Garter MD   Signed by:   Lamar Sprinkles, CMA on 12/16/2009   Method used:   [facility]  [address] [id number]       [address]       [place], [place]  [address]       Ph: [phone] or [phone]       Fax: [phone]   RxID:   [id number]

## 2010-09-28 NOTE — Assessment & Plan Note (Signed)
Summary: gas and bloating/sheri    History of Present Illness Primary GI MD: [name] MD Primary Provider: Sula Soda, MD Requesting Provider: n/a Chief Complaint: Diarrhea, bloating, gas, indigestion x 2 weeks. Pt did have some gluten products and this did bring on these symptoms. Pt has been on the wrong diet and states that now when she eats the food sits on her gut  and is very uncomfortable.  History of Present Illness:   This is a 65 year old, Hispanic female with irritable bowel syndrome with predominant diarrhea who is on a self-imposed gluten-free diet. Her last office visit was in January 2008. She had a flare up of her diarrhea and bloating after she went off her gluten-free diet and started eating regular carbohydrates. She put herself back on a gluten-free diet and Creon as well as Protonix with marked improvement of her symptoms. Her last upper endoscopy in June 2008 showed pylorospasm and normal small bowel biopsies without evidence of villous atrophy. Her tissue transglutaminase level was normal in March 2011. Her last colonoscopy in June 2008 showed a right colon polyp which was adenomatous. A recall colonoscopy will be due in 5 years.   GI Review of Systems    Reports abdominal pain, belching, and  bloating.     Location of  Abdominal pain: upper abdomen.    Denies acid reflux, chest pain, dysphagia with liquids, dysphagia with solids, heartburn, loss of appetite, nausea, vomiting, vomiting blood, weight loss, and  weight gain.      Reports diarrhea.     Denies anal fissure, black tarry stools, change in bowel habit, constipation, diverticulosis, fecal incontinence, heme positive stool, hemorrhoids, irritable bowel syndrome, jaundice, light color stool, liver problems, rectal bleeding, and  rectal pain.    Current Medications (verified): 1)  Climara 0.025 Mg/24hr  Ptwk (Estradiol) .... Take 1 By Mouth Qd 2)  Levsin/sl 0.125 Mg Subl (Hyoscyamine Sulfate) .... Place 1  Tablet Under Tongue Every Four  Hours 3)  Gnp Safety Coated Aspirin 81 Mg  Tbec (Aspirin) .... Once Daily 4)  Clidinium-Chlordiazepoxide 2.5-5 Mg  Caps (Clidinium-Chlordiazepoxide) .... Take 1 Tablet 3 Times Per Day As Needed. 5)  Vitamin D3 1000 Unit  Tabs (Cholecalciferol) .[name].. 1  By Mouth A Week 6)  Loratadine 10 Mg  Tabs (Loratadine) .... Once Daily As Needed Allergies 7)  Fluticasone Propionate 50 Mcg/act Susp (Fluticasone Propionate) .... Use As Directed 8)  Lomotil 2.5-0.025 Mg Tabs (Diphenoxylate-Atropine) .... As Needed For Diarrhea 9)  Lorazepam 0.5 Mg Tabs (Lorazepam) .[name].. 1 By Mouth Two Times A Day As Needed Anxiety 10)  Coricidin Hbp Congestion/cough 10-200 Mg Caps (Dextromethorphan-Guaifenesin) .[name].. 1 Tab Q 4 Hrs 11)  Meclizine Hcl 12.5 Mg Tabs (Meclizine Hcl) .[name].. 1 By Mouth Two Times A Day As Needed Dizziness 12)  Creon 12000 Unit Cpep (Pancrelipase (Lip-Prot-Amyl)) .... Take 1 Tablet By Mouth Two-Three Times Daily (With Meals) 13)  Triamcinolone Acetonide 0.5 % Crea (Triamcinolone Acetonide) .... Use Two Times A Day Prn 14)  Fexofenadine Hcl 60 Mg Tabs (Fexofenadine Hcl) .[name].. 1 By Mouth Two Times A Day As Needed For Allergies 15)  Crestor 5 Mg Tabs (Rosuvastatin Calcium) 16)  Voltaren 1 % Gel (Diclofenac Sodium) .... Use Two Times A Day On A Joint(S) 17)  Flector 1.3 % Ptch (Diclofenac Epolamine) .[name].. 1-2 Patches Once Daily For Pain 18)  Benicar 20 Mg Tabs (Olmesartan Medoxomil) .[name].. 1 Once Daily 19)  Protonix 40 Mg Tbec (Pantoprazole Sodium) .... One Tablet By Mouth Once  Daily 20)  Benicar 20 Mg Tabs (Olmesartan Medoxomil) .... One Tablet By Mouth Once Daily  Allergies (verified): 1)  ! * Asprin 2)  ! Codeine 3)  ! * Altace 4)  ! * Actonel 5)  ! * Ivp Dye 6)  ! Kapidex 7)  Biaxin 8)  Protonix (Pantoprazole Sodium) 9)  Nexium 10)  Losartan Potassium (Losartan Potassium)  Past History:  Past Medical History: Reviewed history from 03/23/2010 and no changes  required. Hypertension CVD Vitamin D def. 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 EGD (12/26/2002) Stess Cardiolite (04/18/2000) R breast Bx Hysterectomy, partial R rot cuff Appendectomy  Family History: Reviewed history from 05/26/2009 and no changes required. Family History Hypertension mother with glaucoma No FH of Colon Cancer: Family History of Heart Disease: Mother, Father M died 37 yo  Social History: Reviewed history from 10/27/2009 and no changes required. Married Former Smoker Alcohol Use - no Illicit Drug Use - no Regular exercise-yes  Review of Systems  The patient denies allergy/sinus, anemia, anxiety-new, arthritis/joint pain, back pain, blood in urine, breast changes/lumps, change in vision, confusion, cough, coughing up blood, depression-new, fainting, fatigue, fever, headaches-new, hearing problems, heart murmur, heart rhythm changes, itching, menstrual pain, muscle pains/cramps, night sweats, nosebleeds, pregnancy symptoms, shortness of breath, skin rash, sleeping problems, sore throat, swelling of feet/legs, swollen lymph glands, thirst - excessive , urination - excessive , urination changes/pain, urine leakage, vision changes, and voice change.         Pertinent positive and negative review of systems were noted in the above HPI. All other ROS was otherwise negative.   Vital Signs:  Patient profile:   65 year old female Height:      59 inches Weight:      133 pounds BMI:     26.96 Pulse rate:   90 / minute Pulse rhythm:   regular BP sitting:   124 / 72  (left arm) Cuff size:   regular  Vitals Entered By: Christie Nottingham CMA Duncan Dull) (May 07, 2010 11:13 AM)  Physical  Exam  General:  Well developed, well nourished, no acute distress. Eyes:  PERRLA, no icterus. Mouth:  No deformity or lesions, dentition normal. Neck:  Supple; no masses or thyromegaly. Lungs:  Clear throughout to auscultation. Heart:  Regular rate and rhythm; no murmurs, rubs,  or bruits. Abdomen:  soft abdomen with normoactive bowel sounds. Nontender. Liver edge at costal margin. Lower abdomen unremarkable. Extremities:  No clubbing, cyanosis, edema or deformities noted. Skin:  Intact without significant lesions or rashes. Psych:  Alert and cooperative. Normal mood and affect.   Impression & Recommendations:  Problem # 1:  IRRITABLE BOWEL SYNDROME (ICD-564.1) Patient is status post flareup of IBS. Her self-imposed gluten-free diet seemed to be working . I have given her samples of Creon to continue on p.r.n. basis. I have also given her samples of Align to take daily to maintain her bacteria flora. She needs a refill on her Librax and Protonix.  Problem # 2:  COLONIC POLYPS, HX OF (ICD-V12.72) A recall colonoscopy will be due in June 2013.  Patient Instructions: 1)  Creon 1-2 with meals .p.r.n. dyspepsia. 2)  Refill Librax 3)  Samples of Align to take p.r.n. 4)  Recall colonoscopy June 2013. 5)  Refill Protonix  40 mg daily. 6)  Copy sent to : Dr Posey Rea 7)  The medication list was reviewed and reconciled.  All changed / newly prescribed medications were explained.  A complete medication list was provided to the patient / caregiver. Prescriptions: CLIDINIUM-CHLORDIAZEPOXIDE 2.5-5 MG  CAPS (CLIDINIUM-CHLORDIAZEPOXIDE) Take 1 tablet 3 times per day as needed.  #90 x 3   Entered by:   Lamona Curl CMA (AAMA)   Authorized by:   Hart Carwin MD   Signed by:   Lamona Curl CMA (AAMA) on 05/07/2010   Method used:   [facility]  [address] [id number]       [address]       [place], [place]  [address]       Ph:  [phone] or [phone]       Fax: [phone]   RxID:   [id number] PROTONIX 40 MG TBEC (PANTOPRAZOLE SODIUM) one tablet by mouth once daily  #30 x 3   Entered by:   Lamona Curl CMA (AAMA)   Authorized by:   Hart Carwin MD   Signed by:   Lamona Curl CMA (AAMA) on 05/07/2010   Method used:   [facility]  [address] [id number]       [address]       [place], [place]  [address]       Ph: [phone] or [phone]       Fax: [phone]   RxID:   [id number]

## 2010-09-28 NOTE — Progress Notes (Signed)
Summary: SAMPLEs  Phone Note Call from Patient Call back at Work Phone [phone] Call back at  OR [phone]   Summary of Call: Pt's insurance will not cover avapro w/o getting it thru [facility]. Rx sent in. Patient is requesting samples.  Initial call taken by: Lamar Sprinkles, CMA,  October 23, 2009 11:23 AM  Follow-up for Phone Call        NO samples available, gave patient discount card  Follow-up by: Lamar Sprinkles, CMA,  October 23, 2009 1:41 PM

## 2010-09-28 NOTE — Progress Notes (Signed)
Summary: Carotid U/S  Phone Note Call from Patient Call back at Home Phone [phone]   Caller: Patient Summary of Call: pt states you and her discussed having a carotid U/S.  She wants to know if she can/should have it done now? Please advise  Initial call taken by: Lanier Prude, [facility]),  June 25, 2010 9:45 AM  Follow-up for Phone Call        ok Follow-up by: Tresa Garter MD,  June 25, 2010 4:58 PM

## 2010-09-28 NOTE — Progress Notes (Signed)
Summary: Avapro  Phone Note Call from Patient Call back at Work Phone [phone] Call back at [phone]   Summary of Call: Patient called in regards to Avapro being changed to 300mg . I made patient aware that this was done due to her insurance not paying for the 150mg , but made our office aware they will cover the 300mg  and patient take 0.5 per day. Patient expressed understanding and will pick up prescription this afternoon. Initial call taken by: Lucious Groves,  November 20, 2009 8:48 AM

## 2010-09-28 NOTE — Progress Notes (Signed)
Summary: Avapro  Phone Note Refill Request Message from:  Fax from Pharmacy on October 26, 2009 11:31 AM  Refills Requested: Medication #1:  AVAPRO 150 MG TABS 1 once daily Fax states that patient plan preferred alternatives are Lisinopril and Ramipril. Please advise.  Initial call taken by: Lucious Groves,  October 26, 2009 11:31 AM  Follow-up for Phone Call        she is allergic to them. Thx Follow-up by: Tresa Garter MD,  October 26, 2009 5:14 PM  Additional Follow-up for Phone Call Additional follow up Details #1::        Left message on voicemail at pharmacy notifying and to fax rejected claim if prior authorization is needed. Additional Follow-up by: Lucious Groves,  October 28, 2009 9:52 AM    Additional Follow-up for Phone Call Additional follow up Details #2::    Spoke with [facility] and notified that the patient is allergic to the preferred meds. Patient can get Avapro 300mg  and take half a tab for a cheaper price and no PA needed. Updated med list and sent prescription to pharmacy. Follow-up by: Lucious Groves,  October 30, 2009 10:24 AM  New/Updated Medications: AVAPRO 300 MG TABS (IRBESARTAN) take 0.5 tab daily Prescriptions: AVAPRO 300 MG TABS (IRBESARTAN) take 0.5 tab daily  #15 x 3   Entered by:   Lucious Groves   Authorized by:   Tresa Garter MD   Signed by:   Lucious Groves on 10/30/2009   Method used:   [facility]  [address] [id number]       [address]       [place], [place]  [address]       Ph: [phone] or [phone]       Fax: [phone]   RxID:   [id number]

## 2010-09-28 NOTE — Assessment & Plan Note (Signed)
Summary: DR AVP PT--LITTLE DIZZY-UPSET STOMACH-FEEL WEIRD ALL OVER  STC   Vital Signs:  Patient profile:   65 year old female Height:      59 inches Weight:      131 pounds BMI:     26.55 O2 Sat:      98 % on Room air Temp:     97.7 degrees F oral Pulse rate:   83 / minute Pulse rhythm:   regular Resp:     16 per minute BP sitting:   150 / 84  (left arm) Cuff size:   large  Vitals Entered By: Rock Nephew CMA (October 27, 2009 9:45 AM)  Nutrition Counseling: Patient's BMI is greater than 25 and therefore counseled on weight management options.  O2 Flow:  Room air CC: abdominal cramps, diarrhea, chills x this am, Diarrhea Is Patient Diabetic? No Pain Assessment Patient in pain? yes     Location: abdomen   Primary Care Provider:  Sula Soda, MD  CC:  abdominal cramps, diarrhea, chills x this am, and Diarrhea.  History of Present Illness:  Diarrhea      This is a 65 year old woman who presents with Diarrhea.  The symptoms began 5 days ago.  The severity is described as moderate.  The patient reports 3 stools or less per day, semiformed/loose stools, alternating diarrhea/constipation, and gradual onset of symptoms, but denies watery/unformed stools, voluminous stools, blood in stool, mucus in stool, greasy stools, malodorous stools, fecal urgency, fecal soiling, nocturnal diarrhea, fasting diarrhea, bloating, gassiness, and abrupt onset of symptoms.  Associated symptoms include abdominal cramps, lightheadedness, and increased thirst.  The patient denies fever, abdominal pain, nausea, vomiting, weight loss, joint pains, mouth ulcers, and eye redness.  The symptoms are worse with any food.  The symptoms are better with hypomotility agents.  Patient's risk factors for diarrhea include recent antibiotic use and day-care center exposure.  Patient has a  history of irritable bowel syndrome and lactose intolerance.    Preventive Screening-Counseling &  Management  Caffeine-Diet-Exercise     Does Patient Exercise: yes  Current Medications (verified): 1)  Climara 0.025 Mg/24hr  Ptwk (Estradiol) .... Take 1 By Mouth Qd 2)  Levsin/sl 0.125 Mg Subl (Hyoscyamine Sulfate) .... Place 1 Tablet Under Tongue Every Four  Hours 3)  Gnp Safety Coated Aspirin 81 Mg  Tbec (Aspirin) .... Once Daily 4)  Clidinium-Chlordiazepoxide 2.5-5 Mg  Caps (Clidinium-Chlordiazepoxide) .... Take 1 Tablet 3 Times Per Day As Needed. 5)  Crestor 10 Mg Tabs (Rosuvastatin Calcium) .[name].. 1 Tablet By Mouth Daily 6)  Vitamin D3 1000 Unit  Tabs (Cholecalciferol) .[name].. 1  By Mouth A Week 7)  Loratadine 10 Mg  Tabs (Loratadine) .... Once Daily As Needed Allergies 8)  Fluticasone Propionate 50 Mcg/act Susp (Fluticasone Propionate) .... Use As Directed 9)  Lomotil 2.5-0.025 Mg Tabs (Diphenoxylate-Atropine) .... As Needed For Diarrhea 10)  Prilosec Otc 20 Mg Tbec (Omeprazole Magnesium) .... Use Once Daily As Needed For Break- Through Heartburn. 11)  Avapro 150 Mg Tabs (Irbesartan) .[name].. 1 Once Daily 12)  Lorazepam 0.5 Mg Tabs (Lorazepam) .[name].. 1 By Mouth Two Times A Day As Needed Anxiety 13)  Coricidin Hbp Congestion/cough 10-200 Mg Caps (Dextromethorphan-Guaifenesin) .[name].. 1 Tab Q 4 Hrs 14)  Meclizine Hcl 12.5 Mg Tabs (Meclizine Hcl) .[name].. 1 By Mouth Two Times A Day As Needed Dizziness 15)  Creon 12000 Unit Cpep (Pancrelipase (Lip-Prot-Amyl)) .... Take 1 Tablet By Mouth Two-Three Times Daily (With Meals) 16)  Ranitidine Hcl 150 Mg Caps (Ranitidine Hcl) .[name].. 1 Po Bid 17)  Omeprazole 20 Mg Cpdr (Omeprazole) .... One By Mouth Daily 18)  Triamcinolone Acetonide 0.5 % Crea (Triamcinolone Acetonide) .... Use Two Times A Day Prn 19)  Hydrochlorothiazide 25 Mg Tabs (Hydrochlorothiazide) .... 1/2 Tab Daily With Avapro  Allergies (verified): 1)  ! * Asprin 2)  ! Codeine 3)  ! * Altace 4)  ! * Actonel 5)  ! * Ivp Dye 6)  ! Kapidex 7)  Biaxin 8)  Protonix (Pantoprazole Sodium) 9)   Nexium  Past History:  Past Medical History: Reviewed history from 07/28/2009 and no changes required. Hypertension CVD Vitamin D def. 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 EGD (12/26/2002) Stess Cardiolite (04/18/2000) R breast Bx Hysterectomy, partial R rot cuff Appendectomy  Family History: Reviewed history from 05/26/2009 and no changes required. Family History Hypertension mother with glaucoma No FH of Colon Cancer: Family History of Heart Disease: Mother, Father M died 59 yo  Social History: Reviewed history from 06/27/2008 and no changes required. Married Former Smoker Alcohol Use - no Illicit Drug Use - no Regular exercise-yes Does Patient Exercise:  yes  Review of Systems  The patient denies anorexia, fever, weight loss, chest pain, prolonged cough, headaches, hemoptysis, abdominal pain, melena, hematochezia, severe indigestion/heartburn, hematuria, suspicious skin lesions, enlarged lymph nodes, and angioedema.    Physical Exam  General:  alert, well-developed, well-nourished, well-hydrated, appropriate dress, normal appearance, healthy-appearing, cooperative to examination, and good hygiene.   Head:  normocephalic, atraumatic, no abnormalities observed, and no abnormalities palpated.   Eyes:  pink, moist mm., Mouth:  Oral mucosa and oropharynx without lesions or exudates.  Teeth in good repair. Neck:  supple, full ROM, no masses, no thyromegaly, no JVD, normal carotid upstroke, no carotid bruits, no cervical lymphadenopathy, and no neck tenderness.   Lungs:  Normal respiratory effort, chest expands symmetrically. Lungs are clear to auscultation, no crackles or wheezes. Heart:  Normal rate and regular rhythm. S1  and S2 normal without gallop, murmur, click, rub or other extra sounds. Abdomen:  soft, non-tender, normal bowel sounds, no distention, no masses, no guarding, no rigidity, no rebound tenderness, no abdominal hernia, no hepatomegaly, and no splenomegaly.   Msk:  No deformity or scoliosis noted of thoracic or lumbar spine.   Pulses:  R and L carotid,radial,femoral,dorsalis pedis and posterior tibial pulses are full and equal bilaterally Extremities:  No clubbing, cyanosis, edema, or deformity noted with normal full range of motion of all joints.   Neurologic:  No cranial nerve deficits noted. Station and gait are normal. Plantar reflexes are down-going bilaterally. DTRs are symmetrical throughout. Sensory, motor and coordinative functions appear intact. Skin:  Intact without suspicious lesions or rashes Cervical Nodes:  no anterior cervical adenopathy and no posterior cervical adenopathy.   Axillary Nodes:  no R axillary adenopathy and no L axillary adenopathy.   Psych:  Cognition and judgment appear intact. Alert and cooperative with normal attention span and concentration. No apparent delusions, illusions, hallucinations   Impression & Recommendations:  Problem # 1:  DIARRHEA OF PRESUMED INFECTIOUS ORIGIN (ICD-009.3) Assessment New start metronidazole in light of recent cipro treatment and risk for C. diff. infection, will check for inflammmatory process, sprue, and dehydration/abnormal electrolytes. Her updated medication list for this problem includes:  Lomotil 2.5-0.025 Mg Tabs (Diphenoxylate-atropine) .[name]... As needed for diarrhea  Orders: Venipuncture (14782) T-Culture, C-Diff Toxin A/B (95621-30865) T-Sprue Panel (Celiac Disease Aby Eval) (83516x3/[address]) TLB-BMP (Basic Metabolic Panel-BMET) (80048-METABOL) TLB-CBC Platelet - w/Differential (85025-CBCD) TLB-Hepatic/Liver Function Pnl (80076-HEPATIC) TLB-Sedimentation Rate (ESR) (85652-ESR)  Problem # 2:  DIZZINESS  (ICD-780.4) Assessment: Unchanged  Her updated medication list for this problem includes:    Loratadine 10 Mg Tabs (Loratadine) ..... Once daily as needed allergies    Meclizine Hcl 12.5 Mg Tabs (Meclizine hcl) .[name]... 1 by mouth two times a day as needed dizziness  Orders: Venipuncture (78469) T-Culture, C-Diff Toxin A/B (62952-84132) TLB-BMP (Basic Metabolic Panel-BMET) (80048-METABOL) TLB-CBC Platelet - w/Differential (85025-CBCD) TLB-Hepatic/Liver Function Pnl (80076-HEPATIC) TLB-Sedimentation Rate (ESR) (85652-ESR)  Problem # 3:  HYPERTENSION (ICD-401.9) Assessment: Unchanged  Her updated medication list for this problem includes:    Avapro 150 Mg Tabs (Irbesartan) .[name]... 1 once daily    Hydrochlorothiazide 25 Mg Tabs (Hydrochlorothiazide) .[name]... 1/2 tab daily with avapro  Orders: Venipuncture (44010) T-Culture, C-Diff Toxin A/B (27253-66440) TLB-BMP (Basic Metabolic Panel-BMET) (80048-METABOL) TLB-CBC Platelet - w/Differential (85025-CBCD) TLB-Hepatic/Liver Function Pnl (80076-HEPATIC) TLB-Sedimentation Rate (ESR) (85652-ESR)  BP today: 150/84 Prior BP: 134/70 (09/21/2009)  Prior 10 Yr Risk Heart Disease: 15 % (11/05/2008)  Labs Reviewed: K+: 4.0 (09/15/2009) Creat: : 0.8 (09/15/2009)   Chol: 177 (09/15/2009)   HDL: 56.40 (09/15/2009)   LDL: 96 (09/15/2009)   TG: 125.0 (09/15/2009)  Complete Medication List: 1)  Climara 0.025 Mg/24hr Ptwk (Estradiol) .... Take 1 by mouth qd 2)  Levsin/sl 0.125 Mg Subl (Hyoscyamine sulfate) .... Place 1 tablet under tongue every four  hours 3)  Gnp Safety Coated Aspirin 81 Mg Tbec (Aspirin) .... Once daily 4)  Clidinium-chlordiazepoxide 2.5-5 Mg Caps (Clidinium-chlordiazepoxide) .... Take 1 tablet 3 times per day as needed. 5)  Crestor 10 Mg Tabs (Rosuvastatin calcium) .[name].. 1 tablet by mouth daily 6)  Vitamin D3 1000 Unit Tabs (Cholecalciferol) .[name].. 1  by mouth a week 7)  Loratadine 10 Mg Tabs (Loratadine) .... Once daily as needed  allergies 8)  Fluticasone Propionate 50 Mcg/act Susp (Fluticasone propionate) .... Use as directed 9)  Lomotil 2.5-0.025 Mg Tabs (Diphenoxylate-atropine) .... As needed for diarrhea 10)  Prilosec Otc 20 Mg Tbec (Omeprazole magnesium) .... Use once daily as needed for break- through heartburn. 11)  Avapro 150 Mg Tabs (Irbesartan) .[name].. 1 once daily 12)  Lorazepam 0.5 Mg Tabs (Lorazepam) .[name].. 1 by mouth two times a day as needed anxiety 13)  Coricidin Hbp Congestion/cough 10-200 Mg Caps (Dextromethorphan-guaifenesin) .[name].. 1 tab q 4 hrs 14)  Meclizine Hcl 12.5 Mg Tabs (Meclizine hcl) .[name].. 1 by mouth two times a day as needed dizziness 15)  Creon 12000 Unit Cpep (Pancrelipase (lip-prot-amyl)) .... Take 1 tablet by mouth two-three times daily (with meals) 16)  Ranitidine Hcl 150 Mg Caps (Ranitidine hcl) .[name].. 1 po bid 17)  Omeprazole 20 Mg Cpdr (Omeprazole) .... One by mouth daily 18)  Triamcinolone Acetonide 0.5 % Crea (Triamcinolone acetonide) .... Use two times a day prn 19)  Hydrochlorothiazide 25 Mg Tabs (Hydrochlorothiazide) .... 1/2 tab daily with avapro 20)  Metronidazole 250 Mg Tabs (Metronidazole) .... One by mouth tid for 7 days  Patient Instructions: 1)  Please schedule a follow-up appointment in 2 weeks. 2)  Drink as much fluid as you can tolerate for the next few days. 3)  Check your Blood Pressure regularly. If it is above 140/90: you should make an appointment. 4)  teh main problem with gastroenteritis  is dehydration. Drink plenty of fluids and take solids as you feel better. If you are unable to keep anything down and/or you show signs of dehydration(dry/cracked lips, lack of tears, not urinating, very sleepy), call our office. 5)  Take your antibiotic as prescribed until ALL of it is gone, but stop if you develop a rash or swelling and contact our office as soon as possible. Prescriptions: METRONIDAZOLE 250 MG TABS (METRONIDAZOLE) One by mouth TID for 7 days  #21 x 1   Entered and  Authorized by:   Etta Grandchild MD   Signed by:   Etta Grandchild MD on 10/27/2009   Method used:   [facility]  [address] [id number]       [address]       [place], [place]  [address]       Ph: [phone] or [phone]       Fax: [phone]   RxID:   [id number]

## 2010-09-28 NOTE — Progress Notes (Signed)
Summary: AVALIDE  Phone Note Call from Patient   Summary of Call: Patient recieved letter from insurance regarding recall of avalide. Rx seperated, Pt informed  Initial call taken by: Lamar Sprinkles, CMA,  October 19, 2009 10:54 AM  Follow-up for Phone Call        Pls change to Avapro and hctz Follow-up by: Tresa Garter MD,  October 19, 2009 1:26 PM    New/Updated Medications: AVAPRO 150 MG TABS (IRBESARTAN) 1 once daily HYDROCHLOROTHIAZIDE 25 MG TABS (HYDROCHLOROTHIAZIDE) 1/2 tab daily with avapro Prescriptions: HYDROCHLOROTHIAZIDE 25 MG TABS (HYDROCHLOROTHIAZIDE) 1/2 tab daily with avapro  #15 x 1   Entered by:   Lamar Sprinkles, CMA   Authorized by:   Tresa Garter MD   Signed by:   Lamar Sprinkles, CMA on 10/19/2009   Method used:   [facility]  [address] [id number]       [address]       [place], [place]  [address]       Ph: [phone] or [phone]       Fax: [phone]   RxID:   [id number] AVAPRO 150 MG TABS (IRBESARTAN) 1 once daily  #30 x 1   Entered by:   Lamar Sprinkles, CMA   Authorized by:   Tresa Garter MD   Signed by:   Lamar Sprinkles, CMA on 10/19/2009   Method used:   [facility]        [facility]  [address] [id number]       [address]       [place], [place]  [address]       Ph: [phone] or [phone]       Fax: [phone]   RxID:   [id number]

## 2010-09-29 ENCOUNTER — Telehealth: Payer: Self-pay | Admitting: Internal Medicine

## 2010-09-30 NOTE — Assessment & Plan Note (Signed)
Summary: BP PROBLEMS/NWS   Vital Signs:  Patient profile:   65 year old female Height:      59 inches Weight:      132 pounds BMI:     26.76 Temp:     98.6 degrees F oral Pulse rate:   92 / minute Pulse rhythm:   regular Resp:     16 per minute BP sitting:   148 / 90  (left arm) Cuff size:   regular  Vitals Entered By: Lanier Prude, CMA(AAMA) (September 21, 2010 1:20 PM) CC: elevated BP, HA and dizziness Is Patient Diabetic? No   Primary Care Provider:  Sula Soda, MD  CC:  elevated BP and HA and dizziness.  History of Present Illness: C/o side effects with Benicar HCT and with Benicar - weak legs The patient presents for a follow up of hypertension, PVD, hyperlipidemia   Current Medications (verified): 1)  Climara 0.025 Mg/24hr  Ptwk (Estradiol) .... Take 1 By Mouth Qd 2)  Levsin/sl 0.125 Mg Subl (Hyoscyamine Sulfate) .... Place 1 Tablet Under Tongue Every Four  Hours 3)  Gnp Safety Coated Aspirin 81 Mg  Tbec (Aspirin) .... Once Daily 4)  Clidinium-Chlordiazepoxide 2.5-5 Mg  Caps (Clidinium-Chlordiazepoxide) .... Take 1 Tablet 3 Times Per Day As Needed. 5)  Vitamin D3 1000 Unit  Tabs (Cholecalciferol) .[name].. 1  By Mouth A Week 6)  Loratadine 10 Mg  Tabs (Loratadine) .... Once Daily As Needed Allergies 7)  Fluticasone Propionate 50 Mcg/act Susp (Fluticasone Propionate) .... Use As Directed 8)  Lomotil 2.5-0.025 Mg Tabs (Diphenoxylate-Atropine) .... As Needed For Diarrhea 9)  Lorazepam 0.5 Mg Tabs (Lorazepam) .[name].. 1 By Mouth Two Times A Day As Needed Anxiety 10)  Coricidin Hbp Congestion/cough 10-200 Mg Caps (Dextromethorphan-Guaifenesin) .[name].. 1 Tab Q 4 Hrs 11)  Meclizine Hcl 12.5 Mg Tabs (Meclizine Hcl) .[name].. 1 By Mouth Two Times A Day As Needed Dizziness 12)  Creon 12000 Unit Cpep (Pancrelipase (Lip-Prot-Amyl)) .... Take 1 Tablet By Mouth Two-Three Times Daily (With Meals) 13)  Triamcinolone Acetonide 0.5 % Crea (Triamcinolone Acetonide) .... Use Two Times A Day Prn 14)   Fexofenadine Hcl 60 Mg Tabs (Fexofenadine Hcl) .[name].. 1 By Mouth Two Times A Day As Needed For Allergies 15)  Crestor 5 Mg Tabs (Rosuvastatin Calcium) 16)  Voltaren 1 % Gel (Diclofenac Sodium) .... Use Two Times A Day On A Joint(S) 17)  Flector 1.3 % Ptch (Diclofenac Epolamine) .[name].. 1-2 Patches Once Daily For Pain 18)  Protonix 40 Mg Tbec (Pantoprazole Sodium) .... One Tablet By Mouth Once Daily 19)  Benicar Hct 20-12.5 Mg Tabs (Olmesartan Medoxomil-Hctz) .[name].. 1 By Mouth Qam  Allergies (verified): 1)  ! * Asprin 2)  ! Codeine 3)  ! * Altace 4)  ! * Actonel 5)  ! * Ivp Dye 6)  ! Kapidex 7)  Biaxin 8)  Protonix (Pantoprazole Sodium) 9)  Nexium 10)  Losartan Potassium (Losartan Potassium) 11)  * Benicar 12)  Hydrochlorothiazide  Past History:  Past Medical History: Last updated: 03/23/2010 Hypertension CVD Vitamin D def. 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 dysplasia Hyperlipidemia Anxiety Celiac sprue 2007 Colonic polyps, hx of Diverticulosis, colon L foot fracture 2011  Past Surgical History: Last updated: 04/23/2008 EGD (12/26/2002) Stess Cardiolite (04/18/2000) R breast Bx Hysterectomy, partial R rot cuff Appendectomy  Social History: Last updated: 10/27/2009 Married Former Smoker Alcohol Use - no Illicit Drug Use - no Regular exercise-yes  Review of Systems  The patient denies fever, chest pain, syncope, and dyspnea on exertion.    Physical Exam  General:  alert, well-developed, well-nourished, well-hydrated, appropriate dress, normal appearance, healthy-appearing, cooperative to examination, and good hygiene.   Nose:  WNL Mouth:  Oral mucosa and oropharynx without lesions or exudates.  Teeth in good repair. Lungs:  Normal respiratory effort, chest expands symmetrically. Lungs are clear to auscultation, no crackles or wheezes. Heart:  Normal rate and regular rhythm. S1 and S2 normal  without gallop, murmur, click, rub or other extra sounds. Abdomen:  soft, non-tender, normal bowel sounds, no distention, no masses, no guarding, no rigidity, no rebound tenderness, no abdominal hernia, no hepatomegaly, and no splenomegaly.   Msk:  NT Neurologic:  No cranial nerve deficits noted. Station and gait are normal. Plantar reflexes are down-going bilaterally. DTRs are symmetrical throughout. Sensory, motor and coordinative functions appear intact. Skin:  WNL Psych:  Cognition and judgment appear intact. Alert and cooperative with normal attention span and concentration. No apparent delusions, illusions, hallucinations   Impression & Recommendations:  Problem # 1:  HYPERTENSION (ICD-401.9) Assessment Deteriorated  seems to be the only one she tolerates The following medications were removed from the medication list:    Benicar Hct 20-12.5 Mg Tabs (Olmesartan medoxomil-hctz) .[name]... 1 by mouth qam Her updated medication list for this problem includes:    Avapro 150 Mg Tabs (Irbesartan) .[name]... 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 Reviewed Doppler w/pt Her updated medication list for this problem includes:    Gnp Safety Coated Aspirin 81 Mg Tbec (Aspirin) ..... Once daily  Problem # 3:  WEAKNESS (ICD-780.79) due to Benicar Assessment: New Med was stopped  Problem # 4:  ANXIETY (ICD-300.00) Assessment: Deteriorated  Her updated medication list for this problem includes:    Lorazepam 0.5 Mg Tabs (Lorazepam) .[name]... 1 by mouth two times a day as needed anxiety    Fluoxetine Hcl 10 Mg Tabs (Fluoxetine hcl) .[name]... 1 by mouth once daily  Complete Medication List: 1)  Climara 0.025 Mg/24hr Ptwk (Estradiol) .... Take 1 by mouth qd 2)  Levsin/sl 0.125 Mg Subl (Hyoscyamine sulfate) .... Place 1 tablet under tongue every four  hours 3)  Gnp Safety Coated Aspirin 81 Mg Tbec (Aspirin) .... Once daily 4)   Clidinium-chlordiazepoxide 2.5-5 Mg Caps (Clidinium-chlordiazepoxide) .... Take 1 tablet 3 times per day as needed. 5)  Vitamin D3 1000 Unit Tabs (Cholecalciferol) .[name].. 1  by mouth a week 6)  Loratadine 10 Mg Tabs (Loratadine) .... Once daily as needed allergies 7)  Fluticasone Propionate 50 Mcg/act Susp (Fluticasone propionate) .... Use as directed 8)  Lomotil 2.5-0.025 Mg Tabs (Diphenoxylate-atropine) .... As needed for diarrhea 9)  Lorazepam 0.5 Mg Tabs (Lorazepam) .[name].. 1 by mouth two times a day as needed anxiety 10)  Coricidin Hbp Congestion/cough 10-200 Mg Caps (Dextromethorphan-guaifenesin) .[name].. 1 tab q 4 hrs 11)  Meclizine Hcl 12.5 Mg Tabs (Meclizine hcl) .[name].. 1 by mouth two times a day as needed dizziness 12)  Creon 12000 Unit Cpep (Pancrelipase (lip-prot-amyl)) .... Take 1 tablet by mouth two-three times daily (with meals) 13)  Triamcinolone Acetonide 0.5 % Crea (Triamcinolone acetonide) .... Use two times a day prn 14)  Fexofenadine Hcl 60 Mg Tabs (Fexofenadine hcl) .[name].. 1 by mouth two times a day as needed for allergies 15)  Crestor 5 Mg Tabs (Rosuvastatin calcium) 16)  Voltaren 1 % Gel (Diclofenac sodium) .... Use two times a day on  a joint(s) 17)  Flector 1.3 % Ptch (Diclofenac epolamine) .[name].. 1-2 patches once daily for pain 18)  Protonix 40 Mg Tbec (Pantoprazole sodium) .... One tablet by mouth once daily 19)  Avapro 150 Mg Tabs (Irbesartan) .[name].. 1 by mouth qd 20)  Fluoxetine Hcl 10 Mg Tabs (Fluoxetine hcl) .[name].. 1 by mouth once daily  Patient Instructions: 1)  Please schedule a follow-up appointment in 3 months. Prescriptions: FLUOXETINE HCL 10 MG TABS (FLUOXETINE HCL) 1 by mouth once daily  #30 x 6   Entered and Authorized by:   Tresa Garter MD   Signed by:   Tresa Garter MD on 09/21/2010   Method used:   Print then Give to Patient   RxID:   [id number] AVAPRO 150 MG TABS (IRBESARTAN) 1 by mouth qd  #30 x 11   Entered and Authorized by:   Tresa Garter MD   Signed by:   Tresa Garter MD on 09/21/2010   Method used:   Print then Give to Patient   RxID:   [id number]    Orders Added: 1)  Est. Patient Level IV [56387]

## 2010-09-30 NOTE — Progress Notes (Signed)
Summary: question  Phone Note Call from Patient Call back at Work Phone [phone] Call back at [phone]   Caller: Patient Summary of Call: Patient called [facility] requesting a call back to discuss BP medication Initial call taken by: Rock Nephew CMA,  September 03, 2010 10:58 AM  Follow-up for Phone Call        pt states that since taking Benicar-HCTZ her legs have been achy and weak and she has fatigue. Pt wants to know if she can go back to regular Benicar without the diuretic and also take a diuretic that was prev. rx'd (1/4 tablet) along with BP med if her BP is elevated-please advise Follow-up by: Brenton Grills CMA Duncan Dull),  September 03, 2010 11:29 AM  Additional Follow-up for Phone Call Additional follow up Details #1::        ok Additional Follow-up by: Tresa Garter MD,  September 03, 2010 1:25 PM    Additional Follow-up for Phone Call Additional follow up Details #2::    Pt advised in detail on "other" number Follow-up by: Margaret Pyle, CMA,  September 03, 2010 2:11 PM

## 2010-09-30 NOTE — Miscellaneous (Signed)
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

## 2010-09-30 NOTE — Progress Notes (Signed)
Summary: Fluoxetine  Phone Note Call from Patient Call back at Work Phone [phone] Call back at [phone]   Summary of Call: Pt wants to know when she should start fluoxetine? Initial call taken by: Lamar Sprinkles, CMA,  September 24, 2010 10:07 AM  Follow-up for Phone Call        This wknd Follow-up by: Tresa Garter MD,  September 24, 2010 1:35 PM  Additional Follow-up for Phone Call Additional follow up Details #1::        left mess for pt  Additional Follow-up by: Lamar Sprinkles, CMA,  September 24, 2010 2:17 PM

## 2010-09-30 NOTE — Assessment & Plan Note (Signed)
Summary: elev bp/#/cd   Vital Signs:  Patient profile:   65 year old female Height:      59 inches Weight:      131 pounds BMI:     26.55 Temp:     97.7 degrees F oral Pulse rate:   84 / minute Pulse rhythm:   regular Resp:     16 per minute BP sitting:   150 / 90  (left arm) Cuff size:   regular  Vitals Entered By: Lanier Prude, Beverly Gust) (August 09, 2010 3:38 PM) CC: elevated BP, HA x 2 wks Is Patient Diabetic? No   Primary Care Provider:  Sula Soda, MD  CC:  elevated BP and HA x 2 wks.  History of Present Illness: F/u HTN C/o elev BP on 20 mg of Benicar (she has not been taking HCTZ); had a HA  Current Medications (verified): 1)  Climara 0.025 Mg/24hr  Ptwk (Estradiol) .... Take 1 By Mouth Qd 2)  Levsin/sl 0.125 Mg Subl (Hyoscyamine Sulfate) .... Place 1 Tablet Under Tongue Every Four  Hours 3)  Gnp Safety Coated Aspirin 81 Mg  Tbec (Aspirin) .... Once Daily 4)  Clidinium-Chlordiazepoxide 2.5-5 Mg  Caps (Clidinium-Chlordiazepoxide) .... Take 1 Tablet 3 Times Per Day As Needed. 5)  Vitamin D3 1000 Unit  Tabs (Cholecalciferol) .[name].. 1  By Mouth A Week 6)  Loratadine 10 Mg  Tabs (Loratadine) .... Once Daily As Needed Allergies 7)  Fluticasone Propionate 50 Mcg/act Susp (Fluticasone Propionate) .... Use As Directed 8)  Lomotil 2.5-0.025 Mg Tabs (Diphenoxylate-Atropine) .... As Needed For Diarrhea 9)  Lorazepam 0.5 Mg Tabs (Lorazepam) .[name].. 1 By Mouth Two Times A Day As Needed Anxiety 10)  Coricidin Hbp Congestion/cough 10-200 Mg Caps (Dextromethorphan-Guaifenesin) .[name].. 1 Tab Q 4 Hrs 11)  Meclizine Hcl 12.5 Mg Tabs (Meclizine Hcl) .[name].. 1 By Mouth Two Times A Day As Needed Dizziness 12)  Creon 12000 Unit Cpep (Pancrelipase (Lip-Prot-Amyl)) .... Take 1 Tablet By Mouth Two-Three Times Daily (With Meals) 13)  Triamcinolone Acetonide 0.5 % Crea (Triamcinolone Acetonide) .... Use Two Times A Day Prn 14)  Fexofenadine Hcl 60 Mg Tabs (Fexofenadine Hcl) .[name].. 1 By Mouth Two  Times A Day As Needed For Allergies 15)  Crestor 5 Mg Tabs (Rosuvastatin Calcium) 16)  Voltaren 1 % Gel (Diclofenac Sodium) .... Use Two Times A Day On A Joint(S) 17)  Flector 1.3 % Ptch (Diclofenac Epolamine) .[name].. 1-2 Patches Once Daily For Pain 18)  Benicar 20 Mg Tabs (Olmesartan Medoxomil) .[name].. 1 Once Daily 19)  Protonix 40 Mg Tbec (Pantoprazole Sodium) .... One Tablet By Mouth Once Daily 20)  Benicar 20 Mg Tabs (Olmesartan Medoxomil) .... One Tablet By Mouth Once Daily  Allergies (verified): 1)  ! * Asprin 2)  ! Codeine 3)  ! * Altace 4)  ! * Actonel 5)  ! * Ivp Dye 6)  ! Kapidex 7)  Biaxin 8)  Protonix (Pantoprazole Sodium) 9)  Nexium 10)  Losartan Potassium (Losartan Potassium)  Past History:  Past Medical History: Last updated: 03/23/2010 Hypertension CVD Vitamin D def. 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 dysplasia Hyperlipidemia Anxiety Celiac sprue 2007 Colonic polyps, hx of Diverticulosis, colon L foot fracture 2011  Social History: Last updated: 10/27/2009 Married Former Smoker Alcohol Use - no Illicit Drug Use - no Regular exercise-yes  Physical Exam  General:  alert, well-developed, well-nourished, well-hydrated, appropriate dress, normal appearance, healthy-appearing, cooperative to examination, and good hygiene.  Mouth:  Oral mucosa and oropharynx without lesions or exudates.  Teeth in good repair. Abdomen:  soft, non-tender, normal bowel sounds, no distention, no masses, no guarding, no rigidity, no rebound tenderness, no abdominal hernia, no hepatomegaly, and no splenomegaly.   Msk:  R wrist is in a splint Extremities:  No clubbing, cyanosis, edema, or deformity noted with normal full range of motion of all joints.   Neurologic:  No cranial nerve deficits noted. Station and gait are normal. Plantar reflexes are down-going bilaterally. DTRs are symmetrical throughout. Sensory,  motor and coordinative functions appear intact. Skin:  Intact without suspicious lesions or rashes Psych:  Cognition and judgment appear intact. Alert and cooperative with normal attention span and concentration. No apparent delusions, illusions, hallucinations   Impression & Recommendations:  Problem # 1:  HYPERTENSION (ICD-401.9) Assessment Deteriorated  The following medications were removed from the medication list:    Benicar 20 Mg Tabs (Olmesartan medoxomil) .[name]... 1 once daily    Benicar 20 Mg Tabs (Olmesartan medoxomil) ..... One tablet by mouth once daily Her updated medication list for this problem includes:    Benicar Hct 20-12.5 Mg Tabs (Olmesartan medoxomil-hctz) .[name]... 1 by mouth qam  Problem # 2:  PERIPHERAL VASCULAR DISEASE (ICD-443.9) Assessment: Unchanged  Complete Medication List: 1)  Climara 0.025 Mg/24hr Ptwk (Estradiol) .... Take 1 by mouth qd 2)  Levsin/sl 0.125 Mg Subl (Hyoscyamine sulfate) .... Place 1 tablet under tongue every four  hours 3)  Gnp Safety Coated Aspirin 81 Mg Tbec (Aspirin) .... Once daily 4)  Clidinium-chlordiazepoxide 2.5-5 Mg Caps (Clidinium-chlordiazepoxide) .... Take 1 tablet 3 times per day as needed. 5)  Vitamin D3 1000 Unit Tabs (Cholecalciferol) .[name].. 1  by mouth a week 6)  Loratadine 10 Mg Tabs (Loratadine) .... Once daily as needed allergies 7)  Fluticasone Propionate 50 Mcg/act Susp (Fluticasone propionate) .... Use as directed 8)  Lomotil 2.5-0.025 Mg Tabs (Diphenoxylate-atropine) .... As needed for diarrhea 9)  Lorazepam 0.5 Mg Tabs (Lorazepam) .[name].. 1 by mouth two times a day as needed anxiety 10)  Coricidin Hbp Congestion/cough 10-200 Mg Caps (Dextromethorphan-guaifenesin) .[name].. 1 tab q 4 hrs 11)  Meclizine Hcl 12.5 Mg Tabs (Meclizine hcl) .[name].. 1 by mouth two times a day as needed dizziness 12)  Creon 12000 Unit Cpep (Pancrelipase (lip-prot-amyl)) .... Take 1 tablet by mouth two-three times daily (with meals) 13)  Triamcinolone  Acetonide 0.5 % Crea (Triamcinolone acetonide) .... Use two times a day prn 14)  Fexofenadine Hcl 60 Mg Tabs (Fexofenadine hcl) .[name].. 1 by mouth two times a day as needed for allergies 15)  Crestor 5 Mg Tabs (Rosuvastatin calcium) 16)  Voltaren 1 % Gel (Diclofenac sodium) .... Use two times a day on a joint(s) 17)  Flector 1.3 % Ptch (Diclofenac epolamine) .[name].. 1-2 patches once daily for pain 18)  Protonix 40 Mg Tbec (Pantoprazole sodium) .... One tablet by mouth once daily 19)  Benicar Hct 20-12.5 Mg Tabs (Olmesartan medoxomil-hctz) .[name].. 1 by mouth qam  Patient Instructions: 1)  Please schedule a follow-up appointment in 3 months. 2)  BMP prior to visit, ICD-9: 3)  Hepatic Panel prior to visit, ICD-9:401.1 4)  Lipid Panel prior to visit, ICD-9: Prescriptions: BENICAR HCT 20-12.5 MG TABS (OLMESARTAN MEDOXOMIL-HCTZ) 1 by mouth qam  #30 x 11   Entered and Authorized by:   Tresa Garter MD   Signed by:   Tresa Garter MD on 08/09/2010   Method used:   Print then Give to Patient  RxID:   [id number]    Orders Added: 1)  Est. Patient Level III [13086]   Immunization History:  Influenza Immunization History:    Influenza:  historical (06/01/2010)   Immunization History:  Influenza Immunization History:    Influenza:  Historical (06/01/2010)

## 2010-09-30 NOTE — Progress Notes (Signed)
Summary: REQ FOR RX  Phone Note Call from Patient Call back at [facility] Phone [phone] Call back at [facility] # or [phone]   Summary of Call: Pt c/o "head cold" & allergies w/sinus congestion. She is req rx for flonase & reccomendation of what else to help. She has tried claritin w/no relief.  Initial call taken by: Lamar Sprinkles, CMA,  August 16, 2010 9:39 AM  Follow-up for Phone Call        ok Flonase Use over-the-counter medicines for "cold": Tylenol  650mg  or Advil 400mg  every 6 hours  for fever; Delsym or Robutussin for cough. Mucinex for congestion. Ricola or Halls for sore throat. Office visit if not better or if worse.  Follow-up by: Tresa Garter MD,  August 16, 2010 5:27 PM  Additional Follow-up for Phone Call Additional follow up Details #1::        Pt informed  Additional Follow-up by: Lamar Sprinkles, CMA,  August 16, 2010 5:48 PM    Additional Follow-up for Phone Call Additional follow up Details #2::    Pt informed  Follow-up by: Lamar Sprinkles, CMA,  August 16, 2010 5:48 PM

## 2010-10-06 NOTE — Progress Notes (Signed)
  Phone Note Other Incoming Call back at [phone]   Caller: Pt  Summary of Call: Pt called and states she caught a bug from her husband she is having chest congestion with slight cough and sinus congestion. [place] and would like a zpak called in for her. Please Advise Initial call taken by: Ami Bullins CMA,  September 29, 2010 8:44 AM  Follow-up for Phone Call        ok zpac Follow-up by: Tresa Garter MD,  September 29, 2010 3:15 PM  Additional Follow-up for Phone Call Additional follow up Details #1::        Patient notified.Alvy Beal Archie CMA  September 29, 2010 4:59 PM     New/Updated Medications: ZITHROMAX Z-PAK 250 MG TABS (AZITHROMYCIN) as dirrected Prescriptions: ZITHROMAX Z-PAK 250 MG TABS (AZITHROMYCIN) as dirrected  #1 x 0   Entered and Authorized by:   Tresa Garter MD   Signed by:   Lamar Sprinkles, CMA on 09/29/2010   Method used:   [facility]  [address] [id number]       [address]       [place], [place]  [address]       Ph: [phone] or [phone]       Fax: [phone]   RxID:   [id number]

## 2010-10-22 ENCOUNTER — Other Ambulatory Visit: Payer: Self-pay | Admitting: Internal Medicine

## 2010-10-22 ENCOUNTER — Other Ambulatory Visit: Payer: 59

## 2010-10-22 ENCOUNTER — Encounter (INDEPENDENT_AMBULATORY_CARE_PROVIDER_SITE_OTHER): Payer: Self-pay | Admitting: *Deleted

## 2010-10-22 DIAGNOSIS — E785 Hyperlipidemia, unspecified: Secondary | ICD-10-CM

## 2010-10-22 DIAGNOSIS — Z0000 Encounter for general adult medical examination without abnormal findings: Secondary | ICD-10-CM

## 2010-10-22 LAB — HEPATIC FUNCTION PANEL
Albumin: 4.3 g/dL (ref 3.5–5.2)
Alkaline Phosphatase: 77 U/L (ref 39–117)
Bilirubin, Direct: 0.1 mg/dL (ref 0.0–0.3)
Total Bilirubin: 0.6 mg/dL (ref 0.3–1.2)

## 2010-10-22 LAB — CBC WITH DIFFERENTIAL/PLATELET
Basophils Relative: 0.6 % (ref 0.0–3.0)
Eosinophils Relative: 2.6 % (ref 0.0–5.0)
Hemoglobin: 13.6 g/dL (ref 12.0–15.0)
Lymphocytes Relative: 43.8 % (ref 12.0–46.0)
Neutrophils Relative %: 43.8 % (ref 43.0–77.0)
RBC: 4.41 Mil/uL (ref 3.87–5.11)
WBC: 4.5 10*3/uL (ref 4.5–10.5)

## 2010-10-22 LAB — URINALYSIS
Specific Gravity, Urine: 1.015 (ref 1.000–1.030)
Urine Glucose: NEGATIVE
Urobilinogen, UA: 0.2 (ref 0.0–1.0)

## 2010-10-22 LAB — BASIC METABOLIC PANEL
Calcium: 9.7 mg/dL (ref 8.4–10.5)
GFR: 89.54 mL/min (ref >60.00)
Sodium: 141 mEq/L (ref 135–145)

## 2010-10-22 LAB — LIPID PANEL
HDL: 57.2 mg/dL (ref >39.00)
Total CHOL/HDL Ratio: 4
Triglycerides: 160 mg/dL — ABNORMAL HIGH (ref 0.0–149.0)
VLDL: 32 mg/dL (ref 0.0–40.0)

## 2010-10-22 LAB — LDL CHOLESTEROL, DIRECT: Direct LDL: 138.2 mg/dL

## 2010-10-28 ENCOUNTER — Encounter: Payer: Self-pay | Admitting: Internal Medicine

## 2010-10-28 ENCOUNTER — Encounter (INDEPENDENT_AMBULATORY_CARE_PROVIDER_SITE_OTHER): Payer: 59 | Admitting: Internal Medicine

## 2010-10-28 DIAGNOSIS — Z0000 Encounter for general adult medical examination without abnormal findings: Secondary | ICD-10-CM

## 2010-11-09 NOTE — Assessment & Plan Note (Signed)
Summary: PHYSICAL / NWS #   Vital Signs:  Patient profile:   65 year old female Height:      59 inches Weight:      132 pounds BMI:     26.76 Temp:     98.4 degrees F oral Pulse rate:   72 / minute Pulse rhythm:   regular Resp:     16 per minute BP sitting:   160 / 86  (left arm) Cuff size:   regular  Vitals Entered By: Lanier Prude, CMA(AAMA) (October 28, 2010 8:51 AM) CC: CPE Is Patient Diabetic? No Comments pt is not taking Protonix   Primary Care Provider:  Sula Soda, MD  CC:  CPE.  History of Present Illness: The patient presents for a preventive health examination   Current Medications (verified): 1)  Climara 0.025 Mg/24hr  Ptwk (Estradiol) .... Take 1 By Mouth Qd 2)  Levsin/sl 0.125 Mg Subl (Hyoscyamine Sulfate) .... Place 1 Tablet Under Tongue Every Four  Hours 3)  Gnp Safety Coated Aspirin 81 Mg  Tbec (Aspirin) .... Once Daily 4)  Clidinium-Chlordiazepoxide 2.5-5 Mg  Caps (Clidinium-Chlordiazepoxide) .... Take 1 Tablet 3 Times Per Day As Needed. 5)  Vitamin D3 1000 Unit  Tabs (Cholecalciferol) .[name].. 1  By Mouth A Week 6)  Loratadine 10 Mg  Tabs (Loratadine) .... Once Daily As Needed Allergies 7)  Fluticasone Propionate 50 Mcg/act Susp (Fluticasone Propionate) .... Use As Directed 8)  Lomotil 2.5-0.025 Mg Tabs (Diphenoxylate-Atropine) .... As Needed For Diarrhea 9)  Lorazepam 0.5 Mg Tabs (Lorazepam) .[name].. 1 By Mouth Two Times A Day As Needed Anxiety 10)  Coricidin Hbp Congestion/cough 10-200 Mg Caps (Dextromethorphan-Guaifenesin) .[name].. 1 Tab Q 4 Hrs 11)  Meclizine Hcl 12.5 Mg Tabs (Meclizine Hcl) .[name].. 1 By Mouth Two Times A Day As Needed Dizziness 12)  Creon 12000 Unit Cpep (Pancrelipase (Lip-Prot-Amyl)) .... Take 1 Tablet By Mouth Two-Three Times Daily (With Meals) 13)  Triamcinolone Acetonide 0.5 % Crea (Triamcinolone Acetonide) .... Use Two Times A Day Prn 14)  Fexofenadine Hcl 60 Mg Tabs (Fexofenadine Hcl) .[name].. 1 By Mouth Two Times A Day As Needed For  Allergies 15)  Crestor 5 Mg Tabs (Rosuvastatin Calcium) 16)  Voltaren 1 % Gel (Diclofenac Sodium) .... Use Two Times A Day On A Joint(S) 17)  Flector 1.3 % Ptch (Diclofenac Epolamine) .[name].. 1-2 Patches Once Daily For Pain 18)  Protonix 40 Mg Tbec (Pantoprazole Sodium) .... One Tablet By Mouth Once Daily 19)  Avapro 150 Mg Tabs (Irbesartan) .[name].. 1 By Mouth Qd 20)  Fluoxetine Hcl 10 Mg Tabs (Fluoxetine Hcl) .[name].. 1 By Mouth Once Daily 21)  Prilosec 20 Mg Cpdr (Omeprazole) .[name].. 1 By Mouth Two Times A Day  Allergies (verified): 1)  ! * Asprin 2)  ! Codeine 3)  ! * Altace 4)  ! * Actonel 5)  ! * Ivp Dye 6)  ! Kapidex 7)  Biaxin 8)  Protonix (Pantoprazole Sodium) 9)  Nexium 10)  Losartan Potassium (Losartan Potassium) 11)  * Benicar 12)  Hydrochlorothiazide  Past History:  Past Medical History: Last updated: 03/23/2010 Hypertension CVD Vitamin D def. 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 dysplasia Hyperlipidemia Anxiety Celiac sprue 2007 Colonic polyps, hx of Diverticulosis, colon L foot fracture 2011  Family History: Last updated: 05/26/2009 Family History Hypertension mother with glaucoma No FH of Colon Cancer: Family History of Heart Disease: Mother, Father M died 76 yo  Social History: Last  updated: 10/28/2010 Married From [place] Former Smoker Alcohol Use - no Illicit Drug Use - no Regular exercise-yes  Social History: Married From [place] Former Smoker Alcohol Use - no Illicit Drug Use - no Regular exercise-yes  Review of Systems  The patient denies fever, chest pain, peripheral edema, abdominal pain, anorexia, weight loss, weight gain, vision loss, decreased hearing, hoarseness, syncope, dyspnea on exertion, prolonged cough, headaches, hemoptysis, melena, hematochezia, severe indigestion/heartburn, hematuria, incontinence, genital sores, muscle weakness, suspicious skin lesions,  transient blindness, difficulty walking, depression, unusual weight change, abnormal bleeding, enlarged lymph nodes, angioedema, and breast masses.         anxiety  Physical Exam  General:  alert, well-developed, well-nourished, well-hydrated, appropriate dress, normal appearance, healthy-appearing, cooperative to examination, and good hygiene.   Head:  normocephalic, atraumatic, no abnormalities observed, and no abnormalities palpated.   Eyes:  No corneal or conjunctival inflammation noted. EOMI. Perrla. Ears:  B TMs OK Nose:  WNL Mouth:  Oral mucosa and oropharynx without lesions or exudates.  Teeth in good repair. Neck:  supple, full ROM, no masses, no thyromegaly, no JVD, normal carotid upstroke, no carotid bruits, no cervical lymphadenopathy, and no neck tenderness.   Chest Wall:  No deformities, masses, or tenderness noted. Lungs:  Normal respiratory effort, chest expands symmetrically. Lungs are clear to auscultation, no crackles or wheezes. Heart:  Normal rate and regular rhythm. S1 and S2 normal without gallop, murmur, click, rub or other extra sounds. Abdomen:  soft, non-tender, normal bowel sounds, no distention, no masses, no guarding, no rigidity, no rebound tenderness, no abdominal hernia, no hepatomegaly, and no splenomegaly.   Msk:  NT LS Pulses:  R and L carotid,radial,femoral,dorsalis pedis and posterior tibial pulses are full and equal bilaterally Extremities:  No clubbing, cyanosis, edema, or deformity noted with normal full range of motion of all joints.   Neurologic:  No cranial nerve deficits noted. Station and gait are normal. Plantar reflexes are down-going bilaterally. DTRs are symmetrical throughout. Sensory, motor and coordinative functions appear intact. Skin:  WNL Cervical Nodes:  No lymphadenopathy noted Inguinal Nodes:  No significant adenopathy Psych:  Cognition and judgment appear intact. Alert and cooperative with normal attention span and concentration. No  apparent delusions, illusions, hallucinations   Impression & Recommendations:  Problem # 1:  HEALTH MAINTENANCE EXAM (ICD-V70.0) Assessment New Colon w/Dr Juanda Chance to sch mammo/PAP is pending  Discussed BDS Orders: EKG w/ Interpretation (93000)OK Vit D Health and age related issues were discussed. Available screening tests and vaccinations were discussed as well. Healthy life style including good diet and exercise was discussed.  The labs were reviewed with the patient.  PAP w/her GYN  Problem # 2:  ANXIETY (ICD-300.00) Assessment: Deteriorated  Her updated medication list for this problem includes:    Lorazepam 0.5 Mg Tabs (Lorazepam) .[name]... 1 by mouth two times a day as needed anxiety    Fluoxetine Hcl 10 Mg Tabs (Fluoxetine hcl) .[name]... 1 by mouth once daily START  Problem # 3:  HYPERTENSION (ICD-401.9)  Assessment: New Treat #2 Her updated medication list for this problem includes:    Avapro 150 Mg Tabs (Irbesartan) .[name]... 1 by mouth qd  Complete Medication List: 1)  Climara 0.025 Mg/24hr Ptwk (Estradiol) .... Take 1 by mouth qd 2)  Levsin/sl 0.125 Mg Subl (Hyoscyamine sulfate) .... Place 1 tablet under tongue every four  hours 3)  Gnp Safety Coated Aspirin 81 Mg Tbec (Aspirin) .... Once daily 4)  Clidinium-chlordiazepoxide 2.5-5 Mg Caps (Clidinium-chlordiazepoxide) .... Take  1 tablet 3 times per day as needed. 5)  Vitamin D3 1000 Unit Tabs (Cholecalciferol) .[name].. 1  by mouth a week 6)  Loratadine 10 Mg Tabs (Loratadine) .... Once daily as needed allergies 7)  Fluticasone Propionate 50 Mcg/act Susp (Fluticasone propionate) .... Use as directed 8)  Lomotil 2.5-0.025 Mg Tabs (Diphenoxylate-atropine) .... As needed for diarrhea 9)  Lorazepam 0.5 Mg Tabs (Lorazepam) .[name].. 1 by mouth two times a day as needed anxiety 10)  Coricidin Hbp Congestion/cough 10-200 Mg Caps (Dextromethorphan-guaifenesin) .[name].. 1 tab q 4 hrs 11)  Meclizine Hcl 12.5 Mg Tabs (Meclizine hcl) .[name].. 1 by mouth two  times a day as needed dizziness 12)  Creon 12000 Unit Cpep (Pancrelipase (lip-prot-amyl)) .... Take 1 tablet by mouth two-three times daily (with meals) 13)  Triamcinolone Acetonide 0.5 % Crea (Triamcinolone acetonide) .... Use two times a day prn 14)  Fexofenadine Hcl 60 Mg Tabs (Fexofenadine hcl) .[name].. 1 by mouth two times a day as needed for allergies 15)  Crestor 5 Mg Tabs (Rosuvastatin calcium) 16)  Voltaren 1 % Gel (Diclofenac sodium) .... Use two times a day on a joint(s) 17)  Flector 1.3 % Ptch (Diclofenac epolamine) .[name].. 1-2 patches once daily for pain 18)  Avapro 150 Mg Tabs (Irbesartan) .[name].. 1 by mouth qd 19)  Fluoxetine Hcl 10 Mg Tabs (Fluoxetine hcl) .[name].. 1 by mouth once daily 20)  Omeprazole 40 Mg Cpdr (Omeprazole) .[name].. 1 by mouth qam for indigestion  Patient Instructions: 1)  Please schedule a follow-up appointment in 3 months. 2)  BMP prior to visit, ICD-9:401.1 3)  Hepatic Panel prior to visit, ICD-9: Prescriptions: OMEPRAZOLE 40 MG CPDR (OMEPRAZOLE) 1 by mouth qam for indigestion  #90 x 3   Entered and Authorized by:   Tresa Garter MD   Signed by:   Tresa Garter MD on 10/28/2010   Method used:   Print then Give to Patient   RxID:   [id number]    Orders Added: 1)  EKG w/ Interpretation [93000] 2)  Est. Patient 40-64 years [99396]   Immunization History:  Tetanus/Td Immunization History:    Tetanus/Td:  td (08/02/2000)   Immunization History:  Tetanus/Td Immunization History:    Tetanus/Td:  Td (08/02/2000)

## 2010-11-17 ENCOUNTER — Ambulatory Visit (INDEPENDENT_AMBULATORY_CARE_PROVIDER_SITE_OTHER): Payer: 59 | Admitting: Obstetrics and Gynecology

## 2010-11-17 DIAGNOSIS — N952 Postmenopausal atrophic vaginitis: Secondary | ICD-10-CM

## 2010-11-17 DIAGNOSIS — N898 Other specified noninflammatory disorders of vagina: Secondary | ICD-10-CM

## 2010-11-17 DIAGNOSIS — B373 Candidiasis of vulva and vagina: Secondary | ICD-10-CM

## 2010-12-28 ENCOUNTER — Encounter: Payer: 59 | Admitting: Obstetrics and Gynecology

## 2010-12-28 ENCOUNTER — Encounter: Payer: Self-pay | Admitting: Internal Medicine

## 2010-12-29 ENCOUNTER — Other Ambulatory Visit: Payer: Self-pay | Admitting: Obstetrics and Gynecology

## 2010-12-29 ENCOUNTER — Other Ambulatory Visit (HOSPITAL_COMMUNITY)
Admission: RE | Admit: 2010-12-29 | Discharge: 2010-12-29 | Disposition: A | Discharge status: Home / Self Care | Payer: 59 | Source: Ambulatory Visit | Attending: Obstetrics and Gynecology | Admitting: Obstetrics and Gynecology

## 2010-12-29 ENCOUNTER — Encounter (INDEPENDENT_AMBULATORY_CARE_PROVIDER_SITE_OTHER): Payer: 59 | Admitting: Obstetrics and Gynecology

## 2010-12-29 DIAGNOSIS — Z01419 Encounter for gynecological examination (general) (routine) without abnormal findings: Secondary | ICD-10-CM

## 2010-12-29 DIAGNOSIS — Z124 Encounter for screening for malignant neoplasm of cervix: Secondary | ICD-10-CM | POA: Insufficient documentation

## 2011-01-11 ENCOUNTER — Encounter: Payer: Self-pay | Admitting: Internal Medicine
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
## 2011-01-11 NOTE — Assessment & Plan Note (Signed)
[facility]                           PRIMARY CARE OFFICE NOTE   NAME:Sharon Paul, Sharon Paul             MRN:          [id number]  DATE:02/23/2007                            DOB:          1946/08/26    The patient is a 65 year old female who presents with complaint of right  arm pain and numbness over the radial dorsal side that developed after  the IV was placed on February 12, 2007 for colonoscopy.  When the needle  went through the skin, it felt like electricity went through the hand.  Now when she touches the area, it feels like a pinch, and she gets  electric shooting pain up and down from the site of injection.  No  bruise or swelling.  It is not getting better.  Advil did not help.   PHYSICAL EXAMINATION:  Blood pressure 136/70.  Pulse 72.  Temperature  97.1.  Weight 125 pounds.  She is in no acute distress.  The examination of the right arm and hand reveals no swelling.  No  bruise.  No change in temperature.  No muscle atrophy.  The area is  quite tender over the radial side of the dorsal wrist.  The  hypersensitivity of the area is going to the proximal 3rd of the radial  forearm, and to the thumb, index, and ring fingers.   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  I think it should heal and resolve nicely with  time.  However, it might take several weeks.  We will start vitamin B  complex 1 daily.  I gave her Voltaren gel to use 1 inch twice a day on  the hand, and she can use Lidoderm patch at nighttime.  She will call me  if not better.  She will let me know if swelling, coldness, loss of  function, or other symptoms would develop.     Georgina Quint. Plotnikov, MD  Electronically Signed    AVP/MedQ  DD: 02/27/2007  DT: 02/28/2007  Job #: [id number]   cc:   Everardo Beals. Juanda Chance, MD, [facility]

## 2011-01-14 NOTE — Assessment & Plan Note (Signed)
[facility]                           GASTROENTEROLOGY OFFICE NOTE   NAME:Sharon Paul, Sharon Paul             MRN:          [id number]  DATE:04/13/2006                            DOB:          August 11, 1946    The patient is a 65 year old Hispanic female with chronic digestive  problems, suspected biliary dysfunction, irritable bowel syndrome, mild  diverticulosis of the left colon on last colonoscopy in 2002.  About a month  ago she developed left upper quadrant abdominal pain that radiates into the  left lower quadrant as well as laterally to the rib cage.  There was no  nausea or vomiting, but she had some diarrhea at the time.  She denies  rectal bleeding.  She saw Dr. Everardo All while I was out of town, and was given  Ceftin 250 mg 4 tabs a day, which she took with complete resolution of her  symptoms.  She is now about 90% better.  The discomfort is still  intermittently with some worsening after she exercises.  Her HIDA scan  recently was negative, with normal ejection fraction, but did reproduce her  symptoms of right upper quadrant abdominal pain.   PHYSICAL EXAMINATION:  VITAL SIGNS:  Blood pressure 148/88, pulse 86, weight  125 pounds.  ABDOMEN:  Exam today was remarkable only for tenderness in the left upper  quadrant and laterally, with normoactive bowel sounds, no mass or rebound.  Right upper quadrant was normal.   IMPRESSION:  65 year old female with intermittent abdominal pain and  diarrhea, consistent with irritable bowel syndrome, question of recent  diverticulitis which responded to antibiotics and bowel rest.   PLAN:  Since the patient is improved, no further treatment is necessary.  If  this occurs again, the patient will take Cipro 250 mg p.o. b.i.d. for 10  days.  I have given her a prescription, and at the same time will put  herself at bowel rest, mostly full liquids and clear liquids.  I also  advised her to take probiotics  for her irritable bowel syndrome.  She will  see [place] in the next few months.                                   Hedwig Morton. Juanda Chance, MD   DMB/MedQ  DD:  04/13/2006  DT:  04/14/2006  Job #:  [id number]   cc:   Sonda Primes, MD

## 2011-01-14 NOTE — Assessment & Plan Note (Signed)
[facility]                         GASTROENTEROLOGY OFFICE NOTE   NAME:Strickling, BASILIA STUCKERT              MRN:          [id number]  DATE:12/13/2006                            DOB:          [birth year]    Ms. Dorning is a 65 year old [place] female with irritable  bowel syndrome, dyspepsia, chronic intermittent left upper quadrant  discomfort, that is usually flared up by a visit to [place] or by  antibiotics or some stress situation.  She has had extensive workup in  the past.  She just returned from home in [place] did well for several  weeks, although she developed left upper quadrant abdominal pain.  The  pain is somewhat improved now, but still there.  She denies diarrhea.   MEDICATIONS:  1. Climara 0.25 mg p.o. daily.  2. Vitamin D 50,000 units daily.  3. Crestor 5 mg 1/2 tablets 3 times a week.  4. __________ 10, two to three times a day.  5. Nexium 40 mg p.o. daily.   PHYSICAL EXAMINATION:  Blood pressure 136/80, pulse 68, weight 126  pounds.  She was alert, oriented and in no distress.  LUNGS:  Were clear to auscultation.  ABDOMEN:  Was soft but tender in the left upper quadrant laterally. No  CVA tenderness.  RECTAL:  Not repeated.   IMPRESSION:  A 65 year old female with known ulcer dyspepsia, chronic  left upper quadrant discomfort.   PLAN:  1. Continue Librax nightly.  2. Add Levosin sublingually 0.125 mg during the day p.r.n.  left upper      quadrant abdominal pain.  3. Repeat colonoscopy. She is due for recall.  4. Take Gaviscon and Levosin p.r.n.  abdominal pain.     Hedwig Morton. Juanda Chance, MD  Electronically Signed    DMB/MedQ  DD: 12/13/2006  DT: 12/13/2006  Job #: [id number]   cc:   Georgina Quint. Plotnikov, MD

## 2011-01-14 NOTE — Op Note (Signed)
[facility]  Patient:    Sharon Paul, Sharon Paul Visit Number: [id number] MRN: [id number]          Service Type: SUR Location: 4W [id number] [id number] Attending Physician:  Marlowe Kays Page Proc. Date: 07/20/01 Admit Date:  07/20/2001 Discharge Date: 07/21/2001                             Operative Report  PREOPERATIVE DIAGNOSIS:  Torn rotator cuff right shoulder.  POSTOPERATIVE DIAGNOSIS:  Torn rotator cuff right shoulder.  PROCEDURE PERFORMED:  Anterior acromionectomy with repair of rotator cuff right shoulder.  SURGEON:  Illene Labrador. Aplington, M.D.  ASSISTANT:  Della Goo, P.A.  ANESTHESIA:  General.  INDICATION:  Chronic progressive pain right shoulder with MRI demonstrating full thickness rotator cuff tear with slight retraction.  This was confirmed at surgery with a 1 cm slightly retracted tear full thickness at the level of the greater tuberosity.  She also had evidence of impingement syndrome with abrasion of the anterior rotator cuff area.  DESCRIPTION OF PROCEDURE:  Satisfactory general anesthesia.  Prophylactic antibiotics.  Beach chair position on [employer] frame.  Right shoulder girdle was prepped with Duraprep and draped in a sterile field.  An [place] employed.  A vertical incision from the greater tuberosity to the [facility] joint. The fascia over the anterior acromion was opened in line with the skin incision, which was quite thin.  I dissected off the deltoid and periosteal layer from the anterior acromion back to the [facility] joint and undermined the anterior acromion with a small Cobb elevator.  Joint fluid came forth confirming the tear.  We identified the [facility] joint with a Keith needle and protecting it made my mark with cautery for the anterior acromionectomy.  This was performed protecting the underlying rotator cuff with the Cobb elevator. She had a fairly tight subacromial space, and I took off a good bit of additional bone  beveling the underneath surface of the anterior acromion with a microsaw and trimming down with a rongeur until the impingement prompt had been corrected.  The rotator cuff tear as mentioned above was immediately identifiable.  I inspected it.  The biceps tendon was intact and there was no retraction on the underneath surfaces of the rotator cuff.  I created a bony trough with a 1/4 inch curved osteotome and rongeur after opening the tear slightly anteriorly and posteriorly for better exposure.  I then placed two drill holes through the greater tuberosity and a single #1 horizontal mattress sutures of #1 Ethibond, which basically brought the torn rotator cuff back in its anatomic position.  I supplemented this with multiple interrupted #1 Vicryl anteriorly and posteriorly.  This gave a nice stable repair.  We once again checked for impingement to make sure that this was corrected.  The wound was irrigated well with sterile saline.  She had preoperative interscalene block, so I did not use any local Marcaine in this particular instance.  The deltoid muscle was reapproximated in two layers with interrupted #1 Vicryl and the fascia and deltoid reattached to the acromion because of the thin fascia with two drill holes of #1 Vicryl supplemented by the #1 Vicryl fascial sutures as well.  This gave a nice tight stable repair.  The subcutaneous tissue was brought together nicely with interrupted 2-0 Vicryl with Steri-Strips on the skin, dry sterile dressing, and shoulder immobilizer were applied.  She tolerated the procedure well and was  taken to the recovery room in satisfactory condition with no known complications. Attending Physician:  Joaquin Courts DD:  07/20/01 TD:  07/21/01 Job: [id number]

## 2011-01-14 NOTE — H&P (Signed)
[facility]  Patient:    Sharon Paul, Sharon Paul Visit Number: [id number] MRN: [id number]          Service Type: Attending:  Illene Labrador. Aplington, M.D. Dictated by:   Ralene Bathe, P.A.                           History and Physical  DATE OF BIRTH:  09/29/2045  CHIEF COMPLAINT:  Right shoulder pain.  HISTORY OF PRESENT ILLNESS:  This is a 65 year old female who has had some ongoing problems related to her right shoulder.  She has had pain off and on over the past few years, however, it has progressed to where she is having pain.  She was sent for MRI, which demonstrated a full-thickness tear of the supraspinatus.  With these findings, it is felt that open rotator cuff repair is warranted.  Due to her pain and dysfunction, she wishes to proceed.  PAST MEDICAL HISTORY: 1. Hypertension. 2. Hypercholesterolemia. 3. Seasonal allergies. 4. GERD.  PAST SURGICAL HISTORY: 1. Partial hysterectomy. 2. Appendectomy. 3. BTL. 4. Lumpectomy x 2.  ALLERGIES:  ASPIRIN, CODEINE, and MORPHINE cause nausea and vomiting.  MEDICATIONS: 1. Lipitor 10 mg q.d. 2. Premarin 0.625 mg q.d. 3. Avapro 150 mg q.d. 4. Claritin 10 mg q.d. 5. Bextra 20 mg q.d. 6. Prevacid p.r.n. 7. Alprazolam p.r.n.  SOCIAL HISTORY:  She does not smoke.  She drinks occasional wine.  She is right-hand dominant.  She is married and does have help.  FAMILY HISTORY:  Significant for hypertension.  REVIEW OF SYSTEMS:  She has had recent upper respiratory tract infection three weeks ago that was treated and has resolved.  Otherwise she has had no recent fevers, chills, night sweats, or bleeding tendencies.  CNS:  No blurred or double vision, seizures, or paralysis.  Respiratory:  No shortness of breath, productive cough, or hemoptysis.  Cardiovascular:  She does have occasional PVC-type flutters which have been worked up with a negative Holter monitor. GI:  No nausea, vomiting, constipation,  or blood in stools.  Genitourinary: No dysuria or hematuria.  Musculoskeletal:  As per the history of present illness.  PHYSICAL EXAMINATION:  A well-developed, well-nourished, 65 year old female.  VITAL SIGNS:  The blood pressure is 146/92.  HEENT:  Normocephalic and atraumatic.  Extraocular movements intact.  NECK:  Supple.  No lymphadenopathy or carotid bruits appreciated on exam.  CHEST:  Clear to auscultation bilaterally.  No rales or rhonchi.  HEART:  Regular rate and rhythm.  No murmurs, rubs, gallops, heaves, thrills, or thrills.  ABDOMEN:  Positive bowel sounds.  Soft and nontender.  The right upper extremity shows neurovascular intact.  She has weakness on rotator cuff testing.  IMPRESSION: 1. Right rotator cuff tear. 2. Hypertension. 3. Hypercholesterolemia. 4. Seasonal allergic rhinitis and sinusitis. 5. Gastroesophageal reflux disease.  PLAN:  The patient will be admitted for repair of right rotator cuff. Dictated by:   Ralene Bathe, P.A. Attending:  Illene Labrador. Aplington, M.D. DD:  07/10/01 TD:  07/10/01 Job: [id number]

## 2011-01-14 NOTE — Procedures (Signed)
[place]  Patient:    Sharon Paul, Sharon Paul Visit Number: [id number] MRN: [id number]          Service Type: END Location: ENDO Attending Physician:  Mervin Hack Dictated by:   Hedwig Morton. Juanda Chance, M.D. [facility] Proc. Date: 05/21/01 Admit Date:  05/21/2001   CC:         Reuel Boom L. Eda Paschal, M.D.  Sonda Primes, M.D. [facility]   Procedure Report  PREPROCEDURE DIAGNOSIS:  POST PROCEDURE DIAGNOSIS:  PROCEDURE:  Colonoscopy.  PHYSICIAN:  Hedwig Morton. Juanda Chance, M.D.  ANESTHESIA:  Versed 10 mg IV and Demerol 50 mg IV.  INDICATION FOR PROCEDURE:  This 65 year old Hispanic female has had several episodes of rectal bleeding which she has attributed to hemorrhoids. She responded to Sitz baths and over-the-counter steroid preparation. She has no family history of colon cancer. She is undergoing colonoscopy for evaluation of rectal bleeding.  ENDOSCOPE:  Fujinon single channel video endoscope.  FINDINGS:  The Fujinon single channel videoscope passed under direct vision into the rectum to the sigmoid colon.  The patient was monitored by pulse oximeter.  Oxygen saturations were normal.  Anal canal showed small internal hemorrhoids which did not prolapse.  On retroflexion, there were first grade hemorrhoids.  There was no bleeding and no fissure.  There were several scattered diverticula through the sigmoid colon but essentially the haustral folds were normal and lumen appeared normal. The descending colon, splenic flexure, transverse colon, hepatic flexure were normal with normal ascending colon and the cecum.  The cecal pouch and ileocecal valve were unremarkable. Her prep was excellent.  The colonoscope was then retracted and the colon decompressed.  The patient tolerated the procedure well.  IMPRESSION: 1. Mild diverticulosis of the left colon. 2. First grade hemorrhoids.  PLAN:  The patients rectal bleeding is likely related to presence of  small hemorrhoids. These may be treated conservatively with topical steroids and Anusol HC suppositories. The patient should stay on a high fiber diet. Because of no family history of colon cancer.  I would suggest repeat colonoscopy in 10 years. Dictated by:   Hedwig Morton. Juanda Chance, M.D. [facility] Attending Physician:  Mervin Hack DD:  05/21/01 TD:  05/21/01 Job: [id number]

## 2011-01-14 NOTE — Assessment & Plan Note (Signed)
[facility]                         GASTROENTEROLOGY OFFICE NOTE   NAME:Sharon Paul, Sharon Paul             MRN:          [id number]  DATE:09/15/2006                            DOB:          [birth year]    Ms. Germani is a 65 year old [place] female with irritable  bowel syndrome, intermittent diarrhea, abdominal pain, hyperlipidemia,  gastroesophageal reflux.  She has episodic abdominal pain, located in  the left upper quadrant.  She has been evaluated for symptomatic  gallbladder disease and has had CT scan of the abdomen several months  ago for evaluation of left upper quadrant abdominal pain, which showed  atherosclerotic calcifications without any aneurysm.  Her pancreas and  gallbladder were normal.  Last upper endoscopy, in 2004, showed mild  gastritis, which was Helicobacter pylori negative.  She has intermittent  diarrhea, which we usually treat with Cipro, Flagyl, after she returns  from [place], assuming there is an infectious cause, but on last  colonoscopy, September 2002, she had diverticulosis of the left colon  and first-grade hemorrhoids.   PHYSICAL EXAMINATION:  Blood pressure 120/70, pulse 80, weight 123  pounds.  She was alert and oriented, in no distress.  LUNGS:  Clear to auscultation.  COR:  With normal S1, normal S2.  ABDOMEN:  Soft with minimal tenderness in the left lower quadrant.  No  distention, no tympany, normoactive bowel sounds.  EXTREMITIES:  No edema.   IMPRESSION:  1. The patient is a 65 year old female with irritable bowel      syndrome/diarrhea.  2. Gastroesophageal reflux.   PLAN:  1. Patient wants to switch from Prilosec to Nexium 40 mg every other      day.  2. Prescription for Cipro 250 p.o. b.i.d. to take for diarrhea, if she      develops diarrhea while in [place].  3. Librax 1 t.i.d. a.c. for irritable bowel syndrome.  4. She will be due for colonoscopy when she returns from [place],  probably in March or April 2008.     Hedwig Morton. Juanda Chance, MD  Electronically Signed    DMB/MedQ  DD: 09/15/2006  DT: 09/15/2006  Job #: [id number]   cc:   Georgina Quint. Plotnikov, MD

## 2011-01-26 ENCOUNTER — Other Ambulatory Visit: Payer: 59

## 2011-01-26 ENCOUNTER — Other Ambulatory Visit: Payer: Self-pay | Admitting: Internal Medicine

## 2011-01-26 DIAGNOSIS — I10 Essential (primary) hypertension: Secondary | ICD-10-CM

## 2011-01-27 ENCOUNTER — Other Ambulatory Visit (INDEPENDENT_AMBULATORY_CARE_PROVIDER_SITE_OTHER): Payer: 59

## 2011-01-27 DIAGNOSIS — I10 Essential (primary) hypertension: Secondary | ICD-10-CM

## 2011-01-27 LAB — BASIC METABOLIC PANEL
BUN: 13 mg/dL (ref 6–23)
CO2: 30 mEq/L (ref 19–32)
Calcium: 10 mg/dL (ref 8.4–10.5)
Chloride: 102 mEq/L (ref 96–112)
Creatinine, Ser: 0.7 mg/dL (ref 0.4–1.2)

## 2011-01-27 LAB — HEPATIC FUNCTION PANEL
ALT: 22 U/L (ref 0–35)
Albumin: 4.4 g/dL (ref 3.5–5.2)
Total Protein: 7.3 g/dL (ref 6.0–8.3)

## 2011-01-31 ENCOUNTER — Encounter: Payer: Self-pay | Admitting: Internal Medicine

## 2011-02-01 ENCOUNTER — Ambulatory Visit (INDEPENDENT_AMBULATORY_CARE_PROVIDER_SITE_OTHER): Payer: 59 | Admitting: Internal Medicine

## 2011-02-01 ENCOUNTER — Encounter: Payer: Self-pay | Admitting: Internal Medicine

## 2011-02-01 DIAGNOSIS — R739 Hyperglycemia, unspecified: Secondary | ICD-10-CM

## 2011-02-01 DIAGNOSIS — M2550 Pain in unspecified joint: Secondary | ICD-10-CM

## 2011-02-01 DIAGNOSIS — K219 Gastro-esophageal reflux disease without esophagitis: Secondary | ICD-10-CM

## 2011-02-01 DIAGNOSIS — R7309 Other abnormal glucose: Secondary | ICD-10-CM

## 2011-02-01 DIAGNOSIS — I10 Essential (primary) hypertension: Secondary | ICD-10-CM

## 2011-02-01 DIAGNOSIS — E785 Hyperlipidemia, unspecified: Secondary | ICD-10-CM

## 2011-02-01 MED ORDER — ALIGN 4 MG PO CAPS: 1.0000 | ORAL_CAPSULE | Freq: Every day | ORAL | Status: DC

## 2011-02-01 MED ORDER — RANITIDINE HCL 150 MG PO TABS: 150.0000 mg | ORAL_TABLET | Freq: Two times a day (BID) | ORAL | Status: DC

## 2011-02-01 NOTE — Assessment & Plan Note (Signed)
Better  

## 2011-02-01 NOTE — Progress Notes (Signed)
  Subjective:    Patient ID: Sharon Paul, female    DOB: [birth year], 65 y.o.   MRN: [id number]  HPI The patient presents for a follow-up of  chronic hypertension, chronic dyslipidemia, PVD controlled with medicines C/o abd bloating after meals off and on x days. Omeprazole helped but it made her bones hurt -- she stopped     Review of Systems  Constitutional: Negative for chills, activity change, appetite change, fatigue and unexpected weight change.  HENT: Negative for congestion, mouth sores and sinus pressure.   Eyes: Negative for visual disturbance.  Respiratory: Negative for cough and chest tightness.   Gastrointestinal: Positive for abdominal distention. Negative for nausea and abdominal pain.  Genitourinary: Negative for frequency, difficulty urinating and vaginal pain.  Musculoskeletal: Negative for back pain and gait problem.  Skin: Negative for pallor and rash.  Neurological: Negative for dizziness, tremors, weakness, numbness and headaches.  Psychiatric/Behavioral: Negative for confusion and sleep disturbance.       Objective:   Physical Exam  Constitutional: She appears well-developed and well-nourished. No distress.  HENT:  Head: Normocephalic.  Right Ear: External ear normal.  Left Ear: External ear normal.  Nose: Nose normal.  Mouth/Throat: Oropharynx is clear and moist.  Eyes: Conjunctivae are normal. Pupils are equal, round, and reactive to light. Right eye exhibits no discharge. Left eye exhibits no discharge.  Neck: Normal range of motion. Neck supple. No JVD present. No tracheal deviation present. No thyromegaly present.  Cardiovascular: Normal rate, regular rhythm and normal heart sounds.   Pulmonary/Chest: No stridor. No respiratory distress. She has no wheezes.  Abdominal: Soft. Bowel sounds are normal. She exhibits no distension and no mass. There is no tenderness. There is no rebound and no guarding.  Musculoskeletal: She exhibits no edema and  no tenderness.  Lymphadenopathy:    She has no cervical adenopathy.  Neurological: She displays normal reflexes. No cranial nerve deficit. She exhibits normal muscle tone. Coordination normal.  Skin: No rash noted. No erythema.  Psychiatric: She has a normal mood and affect. Her behavior is normal. Judgment and thought content normal.          Assessment & Plan:

## 2011-02-01 NOTE — Assessment & Plan Note (Signed)
On Crestor at low dose

## 2011-02-01 NOTE — Assessment & Plan Note (Signed)
Improved on Rx

## 2011-02-01 NOTE — Assessment & Plan Note (Signed)
Worse - start Zantac

## 2011-02-02 ENCOUNTER — Telehealth: Payer: Self-pay | Admitting: *Deleted

## 2011-02-02 NOTE — Telephone Encounter (Signed)
Pt calling back to inform you of the drug allergy that was discussed at her OV--name of drug: Kapidex Pt would like to know if it is [facility] for her to start the Zantac. Please advise.

## 2011-02-03 NOTE — Telephone Encounter (Signed)
OK zantac Thx

## 2011-02-03 NOTE — Telephone Encounter (Signed)
Left pt. vm

## 2011-02-08 ENCOUNTER — Telehealth: Payer: Self-pay | Admitting: *Deleted

## 2011-02-08 MED ORDER — IRBESARTAN 150 MG PO TABS: 150.0000 mg | ORAL_TABLET | Freq: Every day | ORAL | Status: DC

## 2011-02-08 NOTE — Telephone Encounter (Signed)
Pt states that her Rx for Avapro has expired and she needs a script faxed to [facility] for #[id number] and she states that there has to be a note on the fax stating that Rx be given as 'Brand Name Only'. Pt states that she cannot take anything but the brand name medication. Then phone number given by the Pt for the [facility] fax is: [phone] [option 2 for a Rep; option 3 for a fax]

## 2011-02-08 NOTE — Telephone Encounter (Signed)
Ok pls do thx

## 2011-02-08 NOTE — Telephone Encounter (Signed)
Done

## 2011-02-09 ENCOUNTER — Encounter (INDEPENDENT_AMBULATORY_CARE_PROVIDER_SITE_OTHER): Payer: 59

## 2011-02-09 DIAGNOSIS — M899 Disorder of bone, unspecified: Secondary | ICD-10-CM

## 2011-02-25 ENCOUNTER — Telehealth: Payer: Self-pay | Admitting: Internal Medicine

## 2011-02-25 NOTE — Telephone Encounter (Signed)
lmom for pt to call back. Last OV 05/07/10. Hx of IBS with predominant diarrhea, self imposed gluten free diet. Last Colon 02/12/2007 with adenamatous polyp, diverticulosis; Egd 02/12/11- normal.

## 2011-02-25 NOTE — Telephone Encounter (Signed)
Pt reports problems with her stomach since her trip to [place] in February. She states she usually gets a bacterial infection when she goes there. She reports bloating and foul smelling gas x 3 weeks; denies constipation or diarrhea. She takes either Prevacid or Omeprazole for her stomach. Pt given an appt with Dr Juanda Chance on 02/28/11 at 2pm.

## 2011-02-28 ENCOUNTER — Encounter: Payer: Self-pay | Admitting: Internal Medicine

## 2011-02-28 ENCOUNTER — Ambulatory Visit (INDEPENDENT_AMBULATORY_CARE_PROVIDER_SITE_OTHER): Payer: 59 | Admitting: Internal Medicine

## 2011-02-28 VITALS — BP 134/80 | HR 68 | Ht 59.0 in | Wt 133.4 lb

## 2011-02-28 DIAGNOSIS — K589 Irritable bowel syndrome without diarrhea: Secondary | ICD-10-CM

## 2011-02-28 DIAGNOSIS — R1013 Epigastric pain: Secondary | ICD-10-CM

## 2011-02-28 DIAGNOSIS — K3189 Other diseases of stomach and duodenum: Secondary | ICD-10-CM

## 2011-02-28 MED ORDER — PANCRELIPASE (LIP-PROT-AMYL) 12000-38000 UNITS PO CPEP: 1.0000 | ORAL_CAPSULE | Freq: Three times a day (TID) | ORAL | Status: DC

## 2011-02-28 MED ORDER — HYOSCYAMINE SULFATE 0.125 MG SL SUBL: 0.1250 mg | SUBLINGUAL_TABLET | SUBLINGUAL | Status: DC | PRN

## 2011-02-28 NOTE — Progress Notes (Signed)
Sharon Paul 07/24/1946 MRN [id number]        History of Present Illness:  This is a 65 year old, Hispanic female from [place]. She has exacerbation of her irritable bowel syndrome with predominant diarrhea. Also dyspepsia, belching and bloating. She has been on self-imposed gluten-free diet although her small bowel biopsies in June 2008 shown no evidence of villous atrophy. Colonoscopy at the same year showed right colon polyp which was a tubular adenoma. She's had normal liver function tests and normal gallbladder. CT scan of the abdomen in August 2007 showed calcification in the aorta and  status post hysterectomy. She has returned from [place] 4 months ago but has never regained normal GI function. She had some improvement with probiotic .She has continued to take all omeprazole 20 mg daily   Past Medical History  Diagnosis Date  . Hypertension   . CVD (cardiovascular disease)   . Vitamin D deficiency   . Allergic rhinitis   . GERD (gastroesophageal reflux disease)   . IBS (irritable bowel syndrome)   . 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 Hyperlipidemia   . Anxiety   . Celiac sprue 2007  . Hx of colonic polyps   . Diverticulosis of colon   . Foot fracture, left 2011   Past Surgical History  Procedure Date  . Esophagogastroduodenoscopy   . Stress cardiolite 04-18-00  . Breast surgery     right breast biopsy  . Abdominal hysterectomy     partial  . Rotator cuff repair     RIGHT  . Appendectomy     reports that she has quit smoking. She has never used smokeless tobacco. She reports that she does not drink alcohol or use illicit drugs. family history includes Glaucoma in her mother; Heart disease in her father and mother; and Hypertension in her other.  There is no history of Colon cancer. Allergies  Allergen Reactions  . Clarithromycin     REACTION: gi upset  . Codeine   . Esomeprazole Magnesium     REACTION: side  pain  . Hydrochlorothiazide     REACTION: leg cramps  . Iohexol      Desc: PT HAS SWELLING TO FACE,LIPS AND THROAT WITH CONTRAST DYE   . Kapidex (Dexlansoprazole)     rash  . Losartan Potassium     REACTION: HA  . Olmesartan Medoxomil     REACTION: weak legs  . Pantoprazole Sodium     REACTION: arthralgias  . Ramipril   . Risedronate Sodium         Review of Systems: Denies dysphagia or than aphagia. Positive pole early satiety and food intolerance  The remainder of the 10  point ROS is negative except as outlined in H&P   Physical Exam: General appearance  Well developed in no distress Eyes- non icteric HEENT nontraumatic, normocephalic Mouth no lesions, tongue papillated, no cheilosis Neck supple without adenopathy, thyroid not enlarged, no carotid bruits, no JVD Lungs Clear to auscultation bilaterally Cor normal S1 normal S2, regular rhythm , no murmur,  quiet precordium Abdomen soft relaxed with normoactive bowel sounds. No distention or tympany. Minimal tenderness in epigastrium Rectal not done: Extremities no pedal edema Skin no lesions Neurological alert and oriented x3 Psychological normal mood and  affect  Assessment and Plan:  Problems #1 dyspepsia. This is a recurrent problem. History of gastritis and functional dyspepsia. We will increase omeprazole to 20 mg twice a day and add pancreatic enzymes  samples. Also start Levsin sublingually every morning and when necessary abdominal discomfort. We have discussed small meals at supper time to avoid early satiety  Problem #2 IBS and diarrhea. She is up-to-date on her colonoscopy recall colonoscopy in June 2013 for followup of tubular adenoma    02/28/2011 Sharon Paul

## 2011-02-28 NOTE — Patient Instructions (Addendum)
We have sent Levsin to your pharmacy take as needed.  Call back to schedule your upper Endoscopy and previsit with the nurse, the number is [phone]. Dr Posey Rea

## 2011-03-08 ENCOUNTER — Telehealth: Payer: Self-pay | Admitting: Internal Medicine

## 2011-03-11 ENCOUNTER — Telehealth: Payer: Self-pay | Admitting: Internal Medicine

## 2011-03-11 MED ORDER — PANCRELIPASE (LIP-PROT-AMYL) 12000-38000 UNITS PO CPEP: 1.0000 | ORAL_CAPSULE | Freq: Three times a day (TID) | ORAL | Status: DC

## 2011-03-11 NOTE — Telephone Encounter (Signed)
rx was d/c'ed by [facility] as they were not sure if script was supposed to be for 1 capsule tid or 2-3 capsules with meals. I have sent a new corrected script to [facility] and have advised patient of this.

## 2011-04-21 ENCOUNTER — Encounter: Payer: Self-pay | Admitting: Internal Medicine

## 2011-04-21 ENCOUNTER — Other Ambulatory Visit (INDEPENDENT_AMBULATORY_CARE_PROVIDER_SITE_OTHER): Payer: 59

## 2011-04-21 ENCOUNTER — Ambulatory Visit (INDEPENDENT_AMBULATORY_CARE_PROVIDER_SITE_OTHER): Payer: 59 | Admitting: Internal Medicine

## 2011-04-21 VITALS — BP 140/82 | HR 72 | Temp 97.7°F | Ht 59.0 in | Wt 133.1 lb

## 2011-04-21 DIAGNOSIS — R42 Dizziness and giddiness: Secondary | ICD-10-CM

## 2011-04-21 DIAGNOSIS — K219 Gastro-esophageal reflux disease without esophagitis: Secondary | ICD-10-CM

## 2011-04-21 DIAGNOSIS — F411 Generalized anxiety disorder: Secondary | ICD-10-CM

## 2011-04-21 DIAGNOSIS — R109 Unspecified abdominal pain: Secondary | ICD-10-CM

## 2011-04-21 MED ORDER — LORAZEPAM 0.5 MG PO TABS: 0.5000 mg | ORAL_TABLET | Freq: Two times a day (BID) | ORAL | Status: DC

## 2011-04-21 MED ORDER — FLUOXETINE HCL 10 MG PO TABS: 10.0000 mg | ORAL_TABLET | Freq: Every day | ORAL | Status: DC

## 2011-04-21 MED ORDER — PANTOPRAZOLE SODIUM 40 MG PO TBEC: 40.0000 mg | DELAYED_RELEASE_TABLET | Freq: Every day | ORAL | Status: DC

## 2011-04-21 NOTE — Assessment & Plan Note (Signed)
I suspect related to the anxiety, does not appear volume depleted;  Will hold on further w/u at this time,  to f/u any worsening symptoms or concerns

## 2011-04-21 NOTE — Assessment & Plan Note (Signed)
Exam benign, to check h pylori, o/w tx as above, and consider f/u with Dr Juanda Chance

## 2011-04-21 NOTE — Progress Notes (Signed)
Quick Note:  Voice message left on PhoneTree system - lab is negative, normal or otherwise stable, pt to continue same tx ______ 

## 2011-04-21 NOTE — Progress Notes (Signed)
Subjective:    Patient ID: Sharon Paul, female    DOB: 1946-03-31, 65 y.o.   MRN: [id number]  HPI  Here to f/u as Dr Posey Rea not available, c/o reflux , tried the zantac initially 150 bid but only 2 days b/c seemed to cause dizziness, so stopped but reflux even worse it seemed to tried again over the past wk but dizzines seemed worse again, then with incresaed panicky/anixiety with increased abd discomfort, and even more dizzy;  Has tried deep breathing and calming excercises but too nauseas today to eat lunch and came here today.  Worried about BP as well  Remembers prilosec otc seemed to work well for at least 1 yr but then did not work so well. Nexium seemed to cause incrased abd pain, as well as Kapidex as well (rash).  No dysphagia, vomiting, wt loss, fever, or recent bowel change such as constipation/diarrhea.  Still has gallbladder, no problem so far.  Creon per DR Juanda Chance did seem to help;  Was suggested to pt to have EGD but has deferred so far.  H pylori status unknown.  Not drinking more  - only very occasional glass of wine.  Cant remember last dose of OTC nsaid. Last CT abd 2007.  States ongoing marked anxiety, did well with ativan in the past but only has old bottle.Pt denies chest pain, increased sob or doe, wheezing, orthopnea, PND, increased LE swelling, palpitations.  Pt denies new neurological symptoms such as new headache, or facial or extremity weakness or numbness   Pt denies polydipsia, polyuria  Past Medical History  Diagnosis Date  . Hypertension   . CVD (cardiovascular disease)   . Vitamin D deficiency   . Allergic rhinitis   . GERD (gastroesophageal reflux disease)   . IBS (irritable bowel syndrome)   . 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 Hyperlipidemia   . Anxiety   . Celiac sprue 2007  . Hx of colonic polyps   . Diverticulosis of colon   . Foot fracture, left 2011   Past Surgical History  Procedure Date  .  Esophagogastroduodenoscopy   . Stress cardiolite 04-18-00  . Breast surgery     right breast biopsy  . Abdominal hysterectomy     partial  . Rotator cuff repair     RIGHT  . Appendectomy     reports that she has quit smoking. She has never used smokeless tobacco. She reports that she does not drink alcohol or use illicit drugs. family history includes Glaucoma in her mother; Heart disease in her father and mother; and Hypertension in her other.  There is no history of Colon cancer. Allergies  Allergen Reactions  . Clarithromycin     REACTION: gi upset  . Codeine   . Esomeprazole Magnesium     REACTION: side pain  . Hydrochlorothiazide     REACTION: leg cramps  . Iohexol      Desc: PT HAS SWELLING TO FACE,LIPS AND THROAT WITH CONTRAST DYE   . Kapidex (Dexlansoprazole)     rash  . Losartan Potassium     REACTION: HA  . Olmesartan Medoxomil     REACTION: weak legs  . Pantoprazole Sodium     REACTION: arthralgias  . Ramipril   . Risedronate Sodium    Current Outpatient Prescriptions on File Prior to Visit  Medication Sig Dispense Refill  . aspirin 81 MG EC tablet Take 81 mg by mouth daily.        [name]  Cholecalciferol (EQL VITAMIN D3) 1000 UNITS tablet Take 1,000 Units by mouth once a week.        . clidinium-chlordiazePOXIDE (LIBRAX) 2.5-5 MG per capsule Take 1 capsule by mouth 3 (three) times daily as needed.        . diclofenac sodium (VOLTAREN) 1 % GEL Apply topically 2 (two) times daily.        . diphenoxylate-atropine (LOMOTIL) 2.5-0.025 MG per tablet Take 1 tablet by mouth as needed.        [name] estradiol (CLIMARA - DOSED IN MG/24 HR) 0.025 mg/24hr Place 1 patch onto the skin once a week.        . fexofenadine (ALLEGRA) 60 MG tablet Take 60 mg by mouth 2 (two) times daily.        [name] FLUoxetine (PROZAC) 10 MG tablet Take 5 mg by mouth daily.       . hyoscyamine (LEVSIN SL) 0.125 MG SL tablet Place 0.125 mg under the tongue every 4 (four) hours as needed.        . irbesartan  (AVAPRO) 150 MG tablet Take 1 tablet (150 mg total) by mouth at bedtime. Brand name medically necessary  90 tablet  3  . lipase/protease/amylase (CREON) 12000 UNITS CPEP Take 1 capsule by mouth 3 (three) times daily.  270 capsule  3  . loratadine (CLARITIN) 10 MG tablet Take 10 mg by mouth daily.        [name] LORazepam (ATIVAN) 0.5 MG tablet Take 0.5 mg by mouth 2 (two) times daily.        . meclizine (ANTIVERT) 12.5 MG tablet Take 12.5 mg by mouth 2 (two) times daily as needed.        [name] omeprazole (PRILOSEC) 40 MG capsule Take 40 mg by mouth daily.        . Probiotic Product (ALIGN) 4 MG CAPS Take 1 capsule by mouth daily.  30 capsule  6  . rosuvastatin (CRESTOR) 5 MG tablet Take 5 mg by mouth daily.        [name] triamcinolone (KENALOG) 0.5 % cream Apply topically 2 (two) times daily.         Review of Systems Review of Systems  Constitutional: Negative for diaphoresis and unexpected weight change.  HENT: Negative for drooling and tinnitus.   Eyes: Negative for photophobia and visual disturbance.  Respiratory: Negative for choking and stridor.   Gastrointestinal: Negative for vomiting and blood in stool.  Genitourinary: Negative for hematuria and decreased urine volume.    Objective:   Physical Exam BP 140/82  Pulse 72  Temp(Src) 97.7 F (36.5 C) (Oral)  Ht 4\' 11"  (1.499 m)  Wt 133 lb 2 oz (60.385 kg)  BMI 26.89 kg/m2  SpO2 97% Physical Exam  VS noted Constitutional: Pt appears well-developed and well-nourished.  HENT: Head: Normocephalic.  Right Ear: External ear normal.  Left Ear: External ear normal.  Eyes: Conjunctivae and EOM are normal. Pupils are equal, round, and reactive to light.  Neck: Normal range of motion. Neck supple.  Cardiovascular: Normal rate and regular rhythm.   Pulmonary/Chest: Effort normal and breath sounds normal.  Abd:  Soft, NT, non-distended, + BS - benign appearing Neurological: Pt is alert. No cranial nerve deficit.  Skin: Skin is warm. No erythema.    Psychiatric: Pt behavior is normal. Thought content normal.1-2+ nervous         Assessment & Plan:

## 2011-04-21 NOTE — Patient Instructions (Signed)
Take all new medications as prescribed - the generic protonix, and the ativan twice per day as needed Increase the prozac to 10 mg per day Please go to LAB in the Basement for the blood and/or urine tests to be done today - the H pylori test Please call the phone number [phone] (the PhoneTree System) for results of testing in 2-3 days;  When calling, simply dial the number, and when prompted enter the MRN number above (the Medical Record Number) and the # key, then the message should start.

## 2011-04-21 NOTE — Assessment & Plan Note (Signed)
Has intol to mult meds, including nexium and zantac per pt;  Will try generic protonix 40 qd

## 2011-04-21 NOTE — Assessment & Plan Note (Signed)
Assoc with dizzy recurrent - for incresae prozac to 10 mg (could need more), and add ativan .5 bid prn, f/u PCP as planned

## 2011-04-22 ENCOUNTER — Ambulatory Visit: Payer: 59 | Admitting: Internal Medicine

## 2011-04-22 ENCOUNTER — Telehealth: Payer: Self-pay | Admitting: *Deleted

## 2011-04-22 NOTE — Telephone Encounter (Signed)
She can try Avapro 150 mg 1.5 tab/d Thx

## 2011-04-22 NOTE — Telephone Encounter (Signed)
Patient informed, med list updated. (she also already takes hctz 12.5 once daily)

## 2011-04-22 NOTE — Telephone Encounter (Signed)
Pt was seen in the office yesterday. She says BP was slightly elevated at OV and still today. Pt wants to know if she should increase her BP med?

## 2011-05-04 ENCOUNTER — Encounter: Payer: Self-pay | Admitting: Internal Medicine

## 2011-06-01 ENCOUNTER — Other Ambulatory Visit (INDEPENDENT_AMBULATORY_CARE_PROVIDER_SITE_OTHER): Payer: 59

## 2011-06-01 ENCOUNTER — Other Ambulatory Visit: Payer: 59 | Admitting: Internal Medicine

## 2011-06-01 DIAGNOSIS — M2550 Pain in unspecified joint: Secondary | ICD-10-CM

## 2011-06-01 DIAGNOSIS — E785 Hyperlipidemia, unspecified: Secondary | ICD-10-CM

## 2011-06-01 DIAGNOSIS — R739 Hyperglycemia, unspecified: Secondary | ICD-10-CM

## 2011-06-01 DIAGNOSIS — R7309 Other abnormal glucose: Secondary | ICD-10-CM

## 2011-06-01 DIAGNOSIS — I10 Essential (primary) hypertension: Secondary | ICD-10-CM

## 2011-06-01 LAB — COMPREHENSIVE METABOLIC PANEL
ALT: 20 U/L (ref 0–35)
Alkaline Phosphatase: 76 U/L (ref 39–117)
Sodium: 142 mEq/L (ref 135–145)
Total Bilirubin: 0.7 mg/dL (ref 0.3–1.2)
Total Protein: 6.9 g/dL (ref 6.0–8.3)

## 2011-06-01 LAB — LIPID PANEL
Cholesterol: 172 mg/dL (ref 0–200)
HDL: 58.9 mg/dL
LDL Cholesterol: 96 mg/dL (ref 0–99)
Total CHOL/HDL Ratio: 3
Triglycerides: 85 mg/dL (ref 0.0–149.0)
VLDL: 17 mg/dL (ref 0.0–40.0)

## 2011-06-01 LAB — TSH: TSH: 1.59 u[IU]/mL (ref 0.35–5.50)

## 2011-06-07 ENCOUNTER — Encounter: Payer: Self-pay | Admitting: Internal Medicine

## 2011-06-07 ENCOUNTER — Ambulatory Visit (INDEPENDENT_AMBULATORY_CARE_PROVIDER_SITE_OTHER): Payer: 59 | Admitting: Internal Medicine

## 2011-06-07 VITALS — BP 110/80 | HR 88 | Temp 98.1°F | Resp 16 | Wt 132.0 lb

## 2011-06-07 DIAGNOSIS — E785 Hyperlipidemia, unspecified: Secondary | ICD-10-CM

## 2011-06-07 DIAGNOSIS — R7309 Other abnormal glucose: Secondary | ICD-10-CM

## 2011-06-07 DIAGNOSIS — R739 Hyperglycemia, unspecified: Secondary | ICD-10-CM

## 2011-06-07 DIAGNOSIS — I10 Essential (primary) hypertension: Secondary | ICD-10-CM

## 2011-06-07 MED ORDER — IRBESARTAN 150 MG PO TABS: 150.0000 mg | ORAL_TABLET | Freq: Two times a day (BID) | ORAL | Status: DC

## 2011-06-07 MED ORDER — HYDROCHLOROTHIAZIDE 25 MG PO TABS: 25.0000 mg | ORAL_TABLET | Freq: Every day | ORAL | Status: DC

## 2011-06-07 MED ORDER — DIPHENOXYLATE-ATROPINE 2.5-0.025 MG PO TABS: 1.0000 | ORAL_TABLET | ORAL | Status: DC | PRN

## 2011-06-07 NOTE — Assessment & Plan Note (Signed)
Continue with current prescription therapy as reflected on the Med list.  

## 2011-06-07 NOTE — Assessment & Plan Note (Signed)
Recheck in 4 mo

## 2011-06-07 NOTE — Assessment & Plan Note (Signed)
Continue with current prescription therapy as reflected on the Med list. Doing well 

## 2011-06-07 NOTE — Progress Notes (Signed)
  Subjective:    Patient ID: Sharon Paul, female    DOB: 1946/03/02, 65 y.o.   MRN: [id number]  HPI The patient presents for a follow-up of  chronic hypertension, chronic dyslipidemia, anxiety with medicines     Review of Systems  Constitutional: Negative for chills, activity change, appetite change, fatigue and unexpected weight change.  HENT: Negative for congestion, mouth sores and sinus pressure.   Eyes: Negative for visual disturbance.  Respiratory: Negative for cough and chest tightness.   Gastrointestinal: Negative for nausea and abdominal pain.  Genitourinary: Negative for frequency, difficulty urinating and vaginal pain.  Musculoskeletal: Negative for back pain and gait problem.  Skin: Negative for pallor and rash.  Neurological: Negative for dizziness, tremors, weakness, numbness and headaches.  Psychiatric/Behavioral: Negative for confusion and sleep disturbance.       Objective:   Physical Exam  Constitutional: She appears well-developed and well-nourished. No distress.  HENT:  Head: Normocephalic.  Right Ear: External ear normal.  Left Ear: External ear normal.  Nose: Nose normal.  Mouth/Throat: Oropharynx is clear and moist.  Eyes: Conjunctivae are normal. Pupils are equal, round, and reactive to light. Right eye exhibits no discharge. Left eye exhibits no discharge.  Neck: Normal range of motion. Neck supple. No JVD present. No tracheal deviation present. No thyromegaly present.  Cardiovascular: Normal rate, regular rhythm and normal heart sounds.   Pulmonary/Chest: No stridor. No respiratory distress. She has no wheezes.  Abdominal: Soft. Bowel sounds are normal. She exhibits no distension and no mass. There is no tenderness. There is no rebound and no guarding.  Musculoskeletal: She exhibits no edema and no tenderness.  Lymphadenopathy:    She has no cervical adenopathy.  Neurological: She displays normal reflexes. No cranial nerve deficit. She  exhibits normal muscle tone. Coordination normal.  Skin: No rash noted. No erythema.  Psychiatric: She has a normal mood and affect. Her behavior is normal. Judgment and thought content normal.     Lab Results  Component Value Date   WBC 4.5 10/22/2010   HGB 13.6 10/22/2010   HCT 39.4 10/22/2010   PLT 251.0 10/22/2010   GLUCOSE 97 06/01/2011   CHOL 172 06/01/2011   TRIG 85.0 06/01/2011   HDL 58.90 06/01/2011   LDLDIRECT 138.2 10/22/2010   LDLCALC 96 06/01/2011   ALT 20 06/01/2011   AST 21 06/01/2011   NA 142 06/01/2011   K 4.5 06/01/2011   CL 108 06/01/2011   CREATININE 0.8 06/01/2011   BUN 16 06/01/2011   CO2 28 06/01/2011   TSH 1.59 06/01/2011   HGBA1C 6.9* 06/01/2011       Wt Readings from Last 3 Encounters:  06/07/11 132 lb (59.875 kg)  04/21/11 133 lb 2 oz (60.385 kg)  02/28/11 133 lb 6.4 oz (60.51 kg)    Assessment & Plan:

## 2011-06-19 ENCOUNTER — Other Ambulatory Visit: Payer: Self-pay | Admitting: Internal Medicine

## 2011-06-20 ENCOUNTER — Other Ambulatory Visit: Payer: Self-pay | Admitting: Internal Medicine

## 2011-06-22 ENCOUNTER — Other Ambulatory Visit: Payer: Self-pay | Admitting: Internal Medicine

## 2011-06-22 DIAGNOSIS — K219 Gastro-esophageal reflux disease without esophagitis: Secondary | ICD-10-CM

## 2011-06-22 MED ORDER — PANTOPRAZOLE SODIUM 40 MG PO TBEC: 40.0000 mg | DELAYED_RELEASE_TABLET | Freq: Every day | ORAL | Status: DC

## 2011-06-22 NOTE — Telephone Encounter (Signed)
I have spoken to patient and have advised her that Dr Jonny Ruiz sent a 1 year supply to [facility] for her. She states that she is out of the medication and does not have enough to get her thru until [facility] sends her the next order. I have also noted that she has previously indicated that she is allergic to pantoprazole (arthralgias). Patient states that she has been taking the pantoprazole without any problem. I have updated that allergy list and have asked that she contact [facility] for next 90 day supply. I have okayed #30 pills at her local pharmacy until she can get more medication from [facility].

## 2011-06-28 ENCOUNTER — Other Ambulatory Visit: Payer: Self-pay | Admitting: Internal Medicine

## 2011-07-05 NOTE — Telephone Encounter (Signed)
done

## 2011-07-06 ENCOUNTER — Encounter: Payer: Self-pay | Admitting: Internal Medicine

## 2011-07-06 ENCOUNTER — Ambulatory Visit (INDEPENDENT_AMBULATORY_CARE_PROVIDER_SITE_OTHER): Payer: 59 | Admitting: Internal Medicine

## 2011-07-06 ENCOUNTER — Telehealth: Payer: Self-pay

## 2011-07-06 VITALS — BP 140/80 | HR 70 | Temp 97.2°F | Ht 59.0 in | Wt 134.4 lb

## 2011-07-06 DIAGNOSIS — F411 Generalized anxiety disorder: Secondary | ICD-10-CM

## 2011-07-06 DIAGNOSIS — I10 Essential (primary) hypertension: Secondary | ICD-10-CM

## 2011-07-06 DIAGNOSIS — M5431 Sciatica, right side: Secondary | ICD-10-CM

## 2011-07-06 DIAGNOSIS — M5430 Sciatica, unspecified side: Secondary | ICD-10-CM

## 2011-07-06 MED ORDER — TRAMADOL HCL 50 MG PO TABS: 50.0000 mg | ORAL_TABLET | Freq: Four times a day (QID) | ORAL | Status: AC | PRN

## 2011-07-06 MED ORDER — PREDNISONE 10 MG PO TABS: ORAL_TABLET | ORAL | Status: DC

## 2011-07-06 NOTE — Telephone Encounter (Signed)
Message copied by Pincus Sanes on Wed Jul 06, 2011  5:04 PM ------      Message from: Corwin Levins      Created: Wed Jul 06, 2011  4:50 PM       Ok for OTC abreva for now - works ok and is less expensive than rx      ----- Message -----         From: Sharon Paul         Sent: 07/06/2011   3:18 PM           To: Sharon Barre, MD            Patient has a fever blister of her lip and would like to know what to use for this.

## 2011-07-06 NOTE — Patient Instructions (Signed)
Take all new medications as prescribed Continue all other medications as before  

## 2011-07-06 NOTE — Telephone Encounter (Signed)
Called and informed the patient of MD's suggestion.

## 2011-07-06 NOTE — Assessment & Plan Note (Signed)
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

## 2011-07-06 NOTE — Progress Notes (Signed)
Subjective:    Patient ID: Sharon Paul, female    DOB: April 25, 1946, 65 y.o.   MRN: [id number]  HPI  Pt continues to have recurring LBP without change in severity, bowel or bladder change, fever, wt loss,  worsening LE pain/numbness/weakness, gait change or falls, until 3 days ago with worsening right LBP with some radiation to the right buttock, persistent, relatively constant, worse to stand up, better with heat pad last night, hard to sleep otherwise.  Has seen ortho remotely with dx of scoliosis.  Has been doing more exercise and walking recnetly, but not jogging or lfiting or bending.  Pt denies chest pain, increased sob or doe, wheezing, orthopnea, PND, increased LE swelling, palpitations, dizziness or syncope. Pt denies new neurological symptoms such as new headache, or facial or extremity weakness or numbness   Pt denies polydipsia, polyuria,  Denies worsening depressive symptoms, suicidal ideation, or panic, though has ongoing anxiety, not increased recently.  Past Medical History  Diagnosis Date  . Hypertension   . CVD (cardiovascular disease)   . Vitamin D deficiency   . Allergic rhinitis   . GERD (gastroesophageal reflux disease)   . IBS (irritable bowel syndrome)   . 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 Hyperlipidemia   . Anxiety   . Celiac sprue 2007  . Hx of colonic polyps   . Diverticulosis of colon   . Foot fracture, left 2011   Past Surgical History  Procedure Date  . Esophagogastroduodenoscopy   . Stress cardiolite 04-18-00  . Breast surgery     right breast biopsy  . Abdominal hysterectomy     partial  . Rotator cuff repair     RIGHT  . Appendectomy     reports that she has quit smoking. She has never used smokeless tobacco. She reports that she does not drink alcohol or use illicit drugs. family history includes Glaucoma in her mother; Heart disease in her father and mother; and Hypertension in her other.   There is no history of Colon cancer. Allergies  Allergen Reactions  . Clarithromycin     REACTION: gi upset  . Codeine   . Esomeprazole Magnesium     REACTION: side pain  . Hydrochlorothiazide     REACTION: leg cramps  . Iohexol      Desc: PT HAS SWELLING TO FACE,LIPS AND THROAT WITH CONTRAST DYE   . Kapidex (Dexlansoprazole)     rash  . Losartan Potassium     REACTION: HA  . Olmesartan Medoxomil     REACTION: weak legs  . Ramipril   . Risedronate Sodium    Current Outpatient Prescriptions on File Prior to Visit  Medication Sig Dispense Refill  . aspirin 81 MG EC tablet Take 81 mg by mouth daily.        . Cholecalciferol (EQL VITAMIN D3) 1000 UNITS tablet Take 1,000 Units by mouth once a week.        . clidinium-chlordiazePOXIDE (LIBRAX) 2.5-5 MG per capsule Take 1 capsule by mouth 3 (three) times daily as needed.        . CRESTOR 5 MG tablet TAKE 1 TABLET DAILY  90 tablet  2  . diclofenac sodium (VOLTAREN) 1 % GEL Apply topically 2 (two) times daily.        . diphenoxylate-atropine (LOMOTIL) 2.5-0.025 MG per tablet Take 1 tablet by mouth as needed.  30 tablet  0  . estradiol (CLIMARA - DOSED IN MG/24  HR) 0.025 mg/24hr Place 1 patch onto the skin once a week.        . fexofenadine (ALLEGRA) 60 MG tablet Take 60 mg by mouth 2 (two) times daily.        [name] FLUoxetine (PROZAC) 10 MG tablet Take 1 tablet (10 mg total) by mouth daily.  90 tablet  3  . hydrochlorothiazide (HYDRODIURIL) 25 MG tablet Take 1 tablet (25 mg total) by mouth daily.  90 tablet  3  . hyoscyamine (LEVSIN SL) 0.125 MG SL tablet Place 0.125 mg under the tongue every 4 (four) hours as needed.        . irbesartan (AVAPRO) 150 MG tablet Take 1 tablet (150 mg total) by mouth 2 (two) times daily. Brand name medically necessary  180 tablet  3  . lipase/protease/amylase (CREON) 12000 UNITS CPEP Take 1 capsule by mouth 3 (three) times daily.  270 capsule  3  . loratadine (CLARITIN) 10 MG tablet Take 10 mg by mouth daily.         [name] LORazepam (ATIVAN) 0.5 MG tablet Take 1 tablet (0.5 mg total) by mouth 2 (two) times daily.  60 tablet  2  . meclizine (ANTIVERT) 12.5 MG tablet Take 12.5 mg by mouth 2 (two) times daily as needed.        . pantoprazole (PROTONIX) 40 MG tablet Take 1 tablet (40 mg total) by mouth daily.  30 tablet  0  . Probiotic Product (ALIGN) 4 MG CAPS Take 1 capsule by mouth daily.  30 capsule  6  . triamcinolone (KENALOG) 0.5 % cream Apply topically 2 (two) times daily.         Review of Systems Review of Systems  Constitutional: Negative for diaphoresis and unexpected weight change.  HENT: Negative for drooling and tinnitus.   Eyes: Negative for photophobia and visual disturbance.  Respiratory: Negative for choking and stridor.   Gastrointestinal: Negative for vomiting and blood in stool.  Genitourinary: Negative for hematuria and decreased urine volume.        Objective:   Physical Exam BP 140/80  Pulse 70  Temp(Src) 97.2 F (36.2 C) (Oral)  Ht 4\' 11"  (1.499 m)  Wt 134 lb 6 oz (60.952 kg)  BMI 27.14 kg/m2  SpO2 98% Physical Exam  VS noted Constitutional: Pt appears well-developed and well-nourished.  HENT: Head: Normocephalic.  Right Ear: External ear normal.  Left Ear: External ear normal.  Eyes: Conjunctivae and EOM are normal. Pupils are equal, round, and reactive to light.  Neck: Normal range of motion. Neck supple.  Cardiovascular: Normal rate and regular rhythm.   Pulmonary/Chest: Effort normal and breath sounds normal.  Abd:  Soft, NT, non-distended, + BS Neurological: Pt is alert. No cranial nerve deficit. motor/sens/dtr/gait inctact Skin: Skin is warm. No erythema.  Psychiatric: Pt behavior is normal. Thought content normal. 1+ nervous, not depressed affect    Assessment & Plan:

## 2011-07-10 ENCOUNTER — Encounter: Payer: Self-pay | Admitting: Internal Medicine

## 2011-07-10 NOTE — Assessment & Plan Note (Signed)
stable overall by hx and exam, most recent data reviewed with pt, and pt to continue medical treatment as before BP Readings from Last 3 Encounters:  07/06/11 140/80  06/07/11 110/80  04/21/11 140/82

## 2011-07-10 NOTE — Assessment & Plan Note (Signed)
stable overall by hx and exam, most recent data reviewed with pt, and pt to continue medical treatment as before  Lab Results  Component Value Date   WBC 4.5 10/22/2010   HGB 13.6 10/22/2010   HCT 39.4 10/22/2010   PLT 251.0 10/22/2010   GLUCOSE 97 06/01/2011   CHOL 172 06/01/2011   TRIG 85.0 06/01/2011   HDL 58.90 06/01/2011   LDLDIRECT 138.2 10/22/2010   LDLCALC 96 06/01/2011   ALT 20 06/01/2011   AST 21 06/01/2011   NA 142 06/01/2011   K 4.5 06/01/2011   CL 108 06/01/2011   CREATININE 0.8 06/01/2011   BUN 16 06/01/2011   CO2 28 06/01/2011   TSH 1.59 06/01/2011   HGBA1C 6.9* 06/01/2011

## 2011-07-19 ENCOUNTER — Encounter: Payer: Self-pay | Admitting: Gynecology

## 2011-07-19 NOTE — Progress Notes (Signed)
PT. C-O OF SAME SYMPTOMS AS 3 TO 4 MONTHS AGO FOR VAGINAL INFECTION. PT ASKING FOR RX. I TOLD HER IT WOULD BE BEST TO HAVE OV & I TANSFERRED HER TO [facility]PT[facility].

## 2011-07-20 ENCOUNTER — Ambulatory Visit (INDEPENDENT_AMBULATORY_CARE_PROVIDER_SITE_OTHER): Payer: 59 | Admitting: Obstetrics and Gynecology

## 2011-07-20 ENCOUNTER — Encounter: Payer: Self-pay | Admitting: Obstetrics and Gynecology

## 2011-07-20 VITALS — BP 120/74

## 2011-07-20 DIAGNOSIS — N952 Postmenopausal atrophic vaginitis: Secondary | ICD-10-CM

## 2011-07-20 DIAGNOSIS — N951 Menopausal and female climacteric states: Secondary | ICD-10-CM

## 2011-07-20 DIAGNOSIS — Z780 Asymptomatic menopausal state: Secondary | ICD-10-CM

## 2011-07-20 DIAGNOSIS — N898 Other specified noninflammatory disorders of vagina: Secondary | ICD-10-CM

## 2011-07-20 DIAGNOSIS — N899 Noninflammatory disorder of vagina, unspecified: Secondary | ICD-10-CM

## 2011-07-20 DIAGNOSIS — B373 Candidiasis of vulva and vagina: Secondary | ICD-10-CM

## 2011-07-20 MED ORDER — TERCONAZOLE 0.8 % VA CREA: 1.0000 | TOPICAL_CREAM | Freq: Every day | VAGINAL | Status: AC

## 2011-07-20 NOTE — Progress Notes (Signed)
Patient came to see me today for a problem visit. The first problem is that she's noticed over the past several weeks vulvar and vaginal burning. It is not associated with a discharge. She has not been on antibiotic. She used terconazole cream externally last night and she got some relief. She's had no vaginal bleeding. She also wanted to switch the estrogen cream she's been using for atrophic vaginitis. She initially use Vagifem without success. She switched to Estrace cream which works but she feels sluggish the next day after using it. She has been using a KY arousal gel externally which works but which she wonders isn't  creating a yeast infection.  She previously used testosterone cream with good results but is out of prescription.  Pelvic exam: Sharon Paul present. External: No vulvitis. BUS: Within normal limits. Vaginal exam: Good estrogen effect, minimal discharge. Wet prep positive for yeast. Cervix and uterus: Absent  Assessment: #1. Yeast vaginitis #2. Atrophic vaginitis #3. Menopausal symptoms  Plan: Terconazole 3 cream. Urinalysis checked because of the burning. Switch her to Premarin vaginal cream half a gram in vagina 3 times a week. Testosterone cream 2% apply externally 3 times a week.

## 2011-07-20 NOTE — Progress Notes (Signed)
Addended byCammie Mcgee T on: 07/20/2011 10:00 AM   Modules accepted: Orders

## 2011-08-16 ENCOUNTER — Other Ambulatory Visit: Payer: Self-pay | Admitting: Internal Medicine

## 2011-08-29 ENCOUNTER — Telehealth: Payer: Self-pay | Admitting: *Deleted

## 2011-08-29 NOTE — Telephone Encounter (Signed)
Pt states that pharmacy requesting PA for Avepro at [phone]. Have you seen this.?

## 2011-09-01 NOTE — Telephone Encounter (Signed)
Pls inform pt - - can she use 300 mg 1/2 bid Thx

## 2011-09-01 NOTE — Telephone Encounter (Signed)
Called [facility] Plan covers #31 in 30 days. BID dosing will not be covered and No PA can be obtained.  Please advise- can pt take this QD or does Rx need to be changed?

## 2011-09-02 MED ORDER — IRBESARTAN 300 MG PO TABS: 150.0000 mg | ORAL_TABLET | Freq: Two times a day (BID) | ORAL | Status: DC

## 2011-09-02 NOTE — Telephone Encounter (Signed)
Addended by: Merrilyn Puma on: 09/02/2011 10:45 AM   Modules accepted: Orders, Medications

## 2011-09-02 NOTE — Telephone Encounter (Signed)
Pt informed. 30 day supply sent to local and 90 day sent to mail order.

## 2011-09-08 ENCOUNTER — Other Ambulatory Visit: Payer: Self-pay | Admitting: Internal Medicine

## 2011-09-08 ENCOUNTER — Other Ambulatory Visit: Payer: Self-pay

## 2011-09-08 MED ORDER — IRBESARTAN 300 MG PO TABS: 150.0000 mg | ORAL_TABLET | Freq: Two times a day (BID) | ORAL | Status: DC

## 2011-09-08 NOTE — Telephone Encounter (Signed)
Pt called requesting Brand Name Only due to issue with generic medication. New Rx with pt specification send to mail order and local pharmacy.

## 2011-09-08 NOTE — Telephone Encounter (Signed)
Pt called stating we refilled her Avapro in generic form and she wants a new Rf for Brand name. She states that [facility] will not fill brand name unless we appeal it or change it. Pt can pick up a two week supply at local pharmacy for around $150. What should pt do?

## 2011-09-09 ENCOUNTER — Telehealth: Payer: Self-pay | Admitting: *Deleted

## 2011-09-09 NOTE — Telephone Encounter (Signed)
Patient needed confirmation that MD instructions for BP medication were validated. No trouble w/med and/or BP. Done.

## 2011-10-04 ENCOUNTER — Telehealth: Payer: Self-pay | Admitting: Internal Medicine

## 2011-10-04 NOTE — Telephone Encounter (Signed)
Spoke with patient and she states she is having problems with acid reflux. She has been taking Protonix per her PCP but it is not helping like it used too. She is going out of the country next week and would like to be seen prior to leaving. She also states she thinks the upcoming trip may be why her stomach is bothering her because she getting excited/anxious about it. Scheduled patient to see Dr. Juanda Chance on 10/05/11 at 9:30/9:45 AM.

## 2011-10-05 ENCOUNTER — Ambulatory Visit (INDEPENDENT_AMBULATORY_CARE_PROVIDER_SITE_OTHER): Payer: 59 | Admitting: Internal Medicine

## 2011-10-05 ENCOUNTER — Encounter: Payer: Self-pay | Admitting: Internal Medicine

## 2011-10-05 VITALS — BP 122/74 | HR 68 | Ht 59.0 in | Wt 133.0 lb

## 2011-10-05 DIAGNOSIS — K589 Irritable bowel syndrome without diarrhea: Secondary | ICD-10-CM

## 2011-10-05 DIAGNOSIS — K219 Gastro-esophageal reflux disease without esophagitis: Secondary | ICD-10-CM

## 2011-10-05 MED ORDER — HYOSCYAMINE SULFATE 0.125 MG SL SUBL: 0.1250 mg | SUBLINGUAL_TABLET | SUBLINGUAL | Status: DC | PRN

## 2011-10-05 MED ORDER — CILIDINIUM-CHLORDIAZEPOXIDE 2.5-5 MG PO CAPS: 1.0000 | ORAL_CAPSULE | Freq: Three times a day (TID) | ORAL | Status: DC | PRN

## 2011-10-05 MED ORDER — RANITIDINE HCL 150 MG PO TABS: 150.0000 mg | ORAL_TABLET | Freq: Two times a day (BID) | ORAL | Status: DC

## 2011-10-05 NOTE — Progress Notes (Signed)
Sharon Paul 06-07-1946 MRN [id number]   History of Present Illness:  This is a 66 year old female with irritable bowel syndrome and history of gastritis. She comes  for refills of spasmodics prior to her trip to [place] where she is from. She has intermittent left upper quadrant abdominal pain. Most recently, the pain appeared and resolved after she discontinued her probiotic. She is taking Protonix intermittently with omeprazole and Prevacid; each of them for several weeks at a time but usually develops dyspepsia and has to stop taking it. She will be due for a repeat  colonoscopy in June 2013.   Past Medical History  Diagnosis Date  . Hypertension   . CVD (cardiovascular disease)   . Vitamin d deficiency   . Allergic rhinitis   . GERD (gastroesophageal reflux disease)   . IBS (irritable bowel syndrome)   . 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 Hyperlipidemia   . Anxiety   . Celiac sprue 2007  . Hx of colonic polyps   . Diverticulosis of colon   . Foot fracture, left 2011  . Fibroid   . Osteopenia    Past Surgical History  Procedure Date  . Esophagogastroduodenoscopy   . Stress cardiolite 04-18-00  . Breast surgery     right breast biopsy  . Rotator cuff repair     RIGHT  . Appendectomy   . Vaginal hysterectomy     reports that she has quit smoking. She has never used smokeless tobacco. She reports that she does not drink alcohol or use illicit drugs. family history includes Glaucoma in her mother; Heart disease in her father and mother; Hypertension in her father, mother, and paternal grandmother; and Lung cancer in her paternal grandmother.  There is no history of Colon cancer. Allergies  Allergen Reactions  . Clarithromycin     REACTION: gi upset  . Codeine   . Esomeprazole Magnesium     REACTION: side pain  . Hydrochlorothiazide     REACTION: leg cramps  . Iohexol      Desc: PT HAS SWELLING TO FACE,LIPS AND  THROAT WITH CONTRAST DYE   . Kapidex (Dexlansoprazole)     rash  . Losartan Potassium     REACTION: HA  . Olmesartan Medoxomil     REACTION: weak legs  . Ramipril   . Risedronate Sodium         Review of Systems: Denies dysphagia heartburn shortness of breath or chest pain  The remainder of the 10 point ROS is negative except as outlined in H&P   Physical Exam: General appearance  Well developed, in no distress. Eyes- non icteric. HEENT nontraumatic, normocephalic. Mouth no lesions, tongue papillated, no cheilosis. Neck supple without adenopathy, thyroid not enlarged, no carotid bruits, no JVD. Lungs Clear to auscultation bilaterally. Cor normal S1, normal S2, regular rhythm, no murmur,  quiet precordium. Abdomen: Soft nontender abdomen with normal active bowel sounds. No distention. No palpable mass. Rectal: Not done Extremities no pedal edema. Skin no lesions. Neurological alert and oriented x 3. Psychological normal mood and affect.  Assessment and Plan:  Problem #1 Irritable bowel syndrome with predominant diarrhea currently under good control. Patient has a history of gastritis and gastroesophageal reflux. We will switch her to ranitidine 150 mg by mouth twice a day. She will take her probiotics one week out of the month only. We will refill her Hyomax and Librax.   10/05/2011 Sharon Paul

## 2011-10-05 NOTE — Patient Instructions (Signed)
We have sent the following medications to your pharmacy for you to pick up at your convenience: Hyoscyamine Librax Ranitidine twice daily CC: Dr Posey Rea

## 2011-10-12 ENCOUNTER — Ambulatory Visit: Payer: 59 | Admitting: Internal Medicine

## 2011-10-28 ENCOUNTER — Ambulatory Visit: Payer: 59 | Admitting: Internal Medicine

## 2011-11-01 ENCOUNTER — Ambulatory Visit: Payer: 59 | Admitting: Internal Medicine

## 2011-11-04 ENCOUNTER — Ambulatory Visit (INDEPENDENT_AMBULATORY_CARE_PROVIDER_SITE_OTHER): Payer: 59 | Admitting: Internal Medicine

## 2011-11-04 ENCOUNTER — Encounter: Payer: Self-pay | Admitting: Internal Medicine

## 2011-11-04 VITALS — BP 142/80 | HR 78 | Temp 98.3°F

## 2011-11-04 DIAGNOSIS — J019 Acute sinusitis, unspecified: Secondary | ICD-10-CM

## 2011-11-04 DIAGNOSIS — J309 Allergic rhinitis, unspecified: Secondary | ICD-10-CM

## 2011-11-04 DIAGNOSIS — J069 Acute upper respiratory infection, unspecified: Secondary | ICD-10-CM

## 2011-11-04 MED ORDER — HYDROCODONE-HOMATROPINE 5-1.5 MG/5ML PO SYRP: 5.0000 mL | ORAL_SOLUTION | Freq: Four times a day (QID) | ORAL | Status: AC | PRN

## 2011-11-04 MED ORDER — FLUTICASONE PROPIONATE 50 MCG/ACT NA SUSP: 2.0000 | Freq: Every day | NASAL | Status: DC

## 2011-11-04 MED ORDER — AZITHROMYCIN 250 MG PO TABS: ORAL_TABLET | ORAL | Status: AC

## 2011-11-04 NOTE — Progress Notes (Signed)
  Subjective:    HPI  complains of head cold symptoms  Onset >2 week ago, wax/wane symptoms  associated with rhinorrhea, sneezing, sore throat, mild headache and low grade fever Also myalgias, sinus pressure and mild-mod chest congestion Min relief with OTC meds Precipitated by sick contacts  Past Medical History  Diagnosis Date  . Hypertension   . CVD (cardiovascular disease)   . Vitamin d deficiency   . Allergic rhinitis   . GERD (gastroesophageal reflux disease)   . IBS (irritable bowel syndrome)   . 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 Hyperlipidemia   . Anxiety   . Celiac sprue 2007  . Hx of colonic polyps   . Diverticulosis of colon   . Foot fracture, left 2011  . Fibroid   . Osteopenia     Review of Systems Constitutional: No night sweats, no unexpected weight change Pulmonary: No pleurisy or hemoptysis Cardiovascular: No chest pain or palpitations     Objective:   Physical Exam BP 142/80  Pulse 78  Temp(Src) 98.3 F (36.8 C) (Oral)  SpO2 97% GEN: mildly ill appearing and audible head congestion HENT: NCAT, mild sinus tenderness bilaterally, nares with clear discharge, oropharynx mild erythema, no exudate Eyes: Vision grossly intact, no conjunctivitis Lungs: Clear to auscultation without rhonchi or wheeze, no increased work of breathing Cardiovascular: Regular rate and rhythm, no bilateral edema      Assessment & Plan:  Viral URI >> sinusitis, acute Cough, postnasal drip related to above allergic rhinitis    Empiric antibiotics prescribed due to symptom duration greater than 7 days Prescription cough suppression - new prescriptions done Symptomatic care with Tylenol or Advil, hydration and rest -  salt gargle advised as needed Renew flonase for allergy symptoms

## 2011-11-04 NOTE — Patient Instructions (Signed)
It was good to see you today. Zpak antibiotics, Hydromet syrup at night for sinus and cold symptoms - Your prescription(s) have been submitted to your pharmacy. Please take as directed and contact our office if you believe you are having problem(s) with the medication(s). Refill on flonase  Continue other cold medications as ongoing,  call if unimproved in 7-10 d, sooner if worse

## 2011-11-09 ENCOUNTER — Encounter: Payer: Self-pay | Admitting: Internal Medicine

## 2011-12-07 ENCOUNTER — Other Ambulatory Visit (INDEPENDENT_AMBULATORY_CARE_PROVIDER_SITE_OTHER): Payer: 59

## 2011-12-07 DIAGNOSIS — R739 Hyperglycemia, unspecified: Secondary | ICD-10-CM

## 2011-12-07 DIAGNOSIS — R7309 Other abnormal glucose: Secondary | ICD-10-CM

## 2011-12-07 LAB — BASIC METABOLIC PANEL
BUN: 16 mg/dL (ref 6–23)
CO2: 28 mEq/L (ref 19–32)
Chloride: 104 mEq/L (ref 96–112)
Potassium: 4.3 mEq/L (ref 3.5–5.1)

## 2011-12-09 LAB — HEMOGLOBIN A1C: Hgb A1c MFr Bld: 6.1 % (ref 4.6–6.5)

## 2011-12-13 ENCOUNTER — Ambulatory Visit (INDEPENDENT_AMBULATORY_CARE_PROVIDER_SITE_OTHER): Payer: 59 | Admitting: Internal Medicine

## 2011-12-13 VITALS — BP 128/78 | HR 76 | Temp 98.3°F | Resp 16

## 2011-12-13 DIAGNOSIS — R5381 Other malaise: Secondary | ICD-10-CM

## 2011-12-13 DIAGNOSIS — M25511 Pain in right shoulder: Secondary | ICD-10-CM | POA: Insufficient documentation

## 2011-12-13 DIAGNOSIS — I739 Peripheral vascular disease, unspecified: Secondary | ICD-10-CM

## 2011-12-13 DIAGNOSIS — E559 Vitamin D deficiency, unspecified: Secondary | ICD-10-CM

## 2011-12-13 DIAGNOSIS — R5383 Other fatigue: Secondary | ICD-10-CM

## 2011-12-13 DIAGNOSIS — I10 Essential (primary) hypertension: Secondary | ICD-10-CM

## 2011-12-13 DIAGNOSIS — M25519 Pain in unspecified shoulder: Secondary | ICD-10-CM

## 2011-12-13 DIAGNOSIS — I6529 Occlusion and stenosis of unspecified carotid artery: Secondary | ICD-10-CM

## 2011-12-13 DIAGNOSIS — F411 Generalized anxiety disorder: Secondary | ICD-10-CM

## 2011-12-13 NOTE — Assessment & Plan Note (Signed)
Continue with current prescription therapy as reflected on the Med list.  

## 2011-12-13 NOTE — Assessment & Plan Note (Signed)
4/13 - h/o surgery Inj shoulder if not better Ibuprofen

## 2011-12-13 NOTE — Assessment & Plan Note (Signed)
Stressed her dtr is separating

## 2011-12-13 NOTE — Progress Notes (Signed)
Patient ID: Sharon Paul, female   DOB: 1946-08-24, 66 y.o.   MRN: [id number]  Subjective:    Patient ID: Sharon Paul, female    DOB: 08-09-1946, 66 y.o.   MRN: [id number]  Shoulder Pain  Pertinent negatives include no numbness.   The patient presents for a follow-up of  chronic hypertension, chronic dyslipidemia, anxiety with medicines C/o R shoulder pain  Worse in am  BP Readings from Last 3 Encounters:  11/04/11 142/80  10/05/11 122/74  07/20/11 120/74   Wt Readings from Last 3 Encounters:  10/05/11 133 lb (60.328 kg)  07/06/11 134 lb 6 oz (60.952 kg)  06/07/11 132 lb (59.875 kg)         Review of Systems  Constitutional: Negative for chills, activity change, appetite change, fatigue and unexpected weight change.  HENT: Negative for congestion, mouth sores and sinus pressure.   Eyes: Negative for visual disturbance.  Respiratory: Negative for cough and chest tightness.   Gastrointestinal: Negative for nausea and abdominal pain.  Genitourinary: Negative for frequency, difficulty urinating and vaginal pain.  Musculoskeletal: Negative for back pain and gait problem.  Skin: Negative for pallor and rash.  Neurological: Negative for dizziness, tremors, weakness, numbness and headaches.  Psychiatric/Behavioral: Negative for confusion and sleep disturbance.       Objective:   Physical Exam  Constitutional: She appears well-developed and well-nourished. No distress.  HENT:  Head: Normocephalic.  Right Ear: External ear normal.  Left Ear: External ear normal.  Nose: Nose normal.  Mouth/Throat: Oropharynx is clear and moist.  Eyes: Conjunctivae are normal. Pupils are equal, round, and reactive to light. Right eye exhibits no discharge. Left eye exhibits no discharge.  Neck: Normal range of motion. Neck supple. No JVD present. No tracheal deviation present. No thyromegaly present.  Cardiovascular: Normal rate, regular rhythm and normal heart sounds.     Pulmonary/Chest: No stridor. No respiratory distress. She has no wheezes.  Abdominal: Soft. Bowel sounds are normal. She exhibits no distension and no mass. There is no tenderness. There is no rebound and no guarding.  Musculoskeletal: She exhibits no edema and no tenderness.  Lymphadenopathy:    She has no cervical adenopathy.  Neurological: She displays normal reflexes. No cranial nerve deficit. She exhibits normal muscle tone. Coordination normal.  Skin: No rash noted. No erythema.  Psychiatric: She has a normal mood and affect. Her behavior is normal. Judgment and thought content normal.     Lab Results  Component Value Date   WBC 4.5 10/22/2010   HGB 13.6 10/22/2010   HCT 39.4 10/22/2010   PLT 251.0 10/22/2010   GLUCOSE 96 12/07/2011   CHOL 172 06/01/2011   TRIG 85.0 06/01/2011   HDL 58.90 06/01/2011   LDLDIRECT 138.2 10/22/2010   LDLCALC 96 06/01/2011   ALT 20 06/01/2011   AST 21 06/01/2011   NA 140 12/07/2011   K 4.3 12/07/2011   CL 104 12/07/2011   CREATININE 0.7 12/07/2011   BUN 16 12/07/2011   CO2 28 12/07/2011   TSH 1.59 06/01/2011   HGBA1C 6.1 12/07/2011         Assessment & Plan:

## 2011-12-15 ENCOUNTER — Telehealth: Payer: Self-pay | Admitting: *Deleted

## 2011-12-15 DIAGNOSIS — M25511 Pain in right shoulder: Secondary | ICD-10-CM

## 2011-12-15 NOTE — Telephone Encounter (Signed)
Pt reports seen in office 04.16.13, having shoulder pain; was told to use OTC Ibuprofen, patient states that she is having to take too many and is requesting Rx for Ibuprofen 600 mg for shoulder pain.

## 2011-12-16 MED ORDER — IBUPROFEN 600 MG PO TABS: 600.0000 mg | ORAL_TABLET | Freq: Three times a day (TID) | ORAL | Status: DC | PRN

## 2011-12-16 NOTE — Telephone Encounter (Signed)
Pt informed by Tobi Bastos, [employer] registrar.

## 2011-12-16 NOTE — Telephone Encounter (Signed)
done

## 2011-12-25 ENCOUNTER — Encounter: Payer: Self-pay | Admitting: Internal Medicine

## 2012-01-04 ENCOUNTER — Ambulatory Visit (INDEPENDENT_AMBULATORY_CARE_PROVIDER_SITE_OTHER): Payer: 59 | Admitting: Obstetrics and Gynecology

## 2012-01-04 ENCOUNTER — Encounter: Payer: Self-pay | Admitting: Obstetrics and Gynecology

## 2012-01-04 VITALS — BP 114/74 | Ht <= 58 in | Wt 134.0 lb

## 2012-01-04 DIAGNOSIS — R32 Unspecified urinary incontinence: Secondary | ICD-10-CM | POA: Insufficient documentation

## 2012-01-04 DIAGNOSIS — Z01419 Encounter for gynecological examination (general) (routine) without abnormal findings: Secondary | ICD-10-CM

## 2012-01-04 MED ORDER — ESTRADIOL 0.025 MG/24HR TD PTWK: 1.0000 | MEDICATED_PATCH | TRANSDERMAL | Status: DC

## 2012-01-04 NOTE — Progress Notes (Signed)
Patient came to see me today for her annual GYN exam. She remains on Climara 0.025 mg weekly with relief of vasomotor symptoms, sleep disturbance, and vaginal dryness. She is having no pelvic pain. She is scheduled for yearly mammogram. She is having no vaginal bleeding. She has stable osteopenia without an elevated FRAX risk. She is due for a followup bone density next year. She had a low vitamin D but is normal on 50,000 IUs of vitamin D every other week. Over the past month she had noticed left axillary tenderness without mass. She had switched deodorant's when it happened. She is going to another deodorant and for the past several days the pain is gone.  HEENT: Within normal limits.  Kennon Portela present Neck: No masses. Supraclavicular lymph nodes: Not enlarged. Breasts: Examined in both sitting and lying position. Symmetrical without skin changes or masses. Abdomen: Soft no masses guarding or rebound. No hernias. Pelvic: External within normal limits. BUS within normal limits. Vaginal examination shows good estrogen effect, no cystocele enterocele or rectocele. Cervix and uterus absent. Adnexa within normal limits. Rectovaginal confirmatory. Extremities within normal limits.  Assessment: #1. Menopausal symptoms #2. Osteopenia #3. Left axillary tenderness now gone. #4. Vitamin D deficiency  Plan: Mammogram. She will make radiologist aware of the tenderness. Bone density in 2014. Continue Climara patch.

## 2012-01-05 LAB — URINALYSIS W MICROSCOPIC + REFLEX CULTURE
Bacteria, UA: NONE SEEN
Hgb urine dipstick: NEGATIVE
Ketones, ur: NEGATIVE mg/dL
Leukocytes, UA: NEGATIVE
Nitrite: NEGATIVE
Specific Gravity, Urine: 1.005 (ref 1.005–1.030)
Urobilinogen, UA: 0.2 mg/dL (ref 0.0–1.0)

## 2012-01-11 ENCOUNTER — Encounter: Payer: Self-pay | Admitting: Obstetrics and Gynecology

## 2012-01-12 ENCOUNTER — Ambulatory Visit: Payer: 59 | Admitting: Internal Medicine

## 2012-01-17 ENCOUNTER — Encounter: Payer: Self-pay | Admitting: Obstetrics and Gynecology

## 2012-03-02 ENCOUNTER — Ambulatory Visit (INDEPENDENT_AMBULATORY_CARE_PROVIDER_SITE_OTHER)
Admission: RE | Admit: 2012-03-02 | Discharge: 2012-03-02 | Disposition: A | Discharge status: Home / Self Care | Payer: 59 | Source: Ambulatory Visit | Attending: Internal Medicine | Admitting: Internal Medicine

## 2012-03-02 ENCOUNTER — Encounter: Payer: Self-pay | Admitting: Internal Medicine

## 2012-03-02 ENCOUNTER — Ambulatory Visit (INDEPENDENT_AMBULATORY_CARE_PROVIDER_SITE_OTHER): Payer: 59 | Admitting: Internal Medicine

## 2012-03-02 VITALS — BP 138/78 | HR 80 | Temp 98.6°F | Resp 16 | Wt 133.5 lb

## 2012-03-02 DIAGNOSIS — R05 Cough: Secondary | ICD-10-CM

## 2012-03-02 DIAGNOSIS — J209 Acute bronchitis, unspecified: Secondary | ICD-10-CM

## 2012-03-02 MED ORDER — MOXIFLOXACIN HCL 400 MG PO TABS: 400.0000 mg | ORAL_TABLET | Freq: Every day | ORAL | Status: AC

## 2012-03-02 MED ORDER — PROMETHAZINE-DM 6.25-15 MG/5ML PO SYRP: 5.0000 mL | ORAL_SOLUTION | Freq: Four times a day (QID) | ORAL | Status: AC | PRN

## 2012-03-02 NOTE — Progress Notes (Signed)
  Subjective:    Patient ID: Sharon Paul, female    DOB: 02/12/1946, 66 y.o.   MRN: [id number]  Cough This is a new problem. The current episode started in the past 7 days. The problem has been unchanged. The cough is productive of purulent sputum. Associated symptoms include chills, a fever and myalgias. Pertinent negatives include no chest pain, ear congestion, ear pain, headaches, heartburn, hemoptysis, nasal congestion, postnasal drip, rash, rhinorrhea, sore throat, shortness of breath, sweats, weight loss or wheezing. Nothing aggravates the symptoms. She has tried OTC cough suppressant for the symptoms. The treatment provided mild relief. Her past medical history is significant for bronchitis.      Review of Systems  Constitutional: Positive for fever and chills. Negative for weight loss, diaphoresis, activity change, appetite change, fatigue and unexpected weight change.  HENT: Negative.  Negative for ear pain, sore throat, rhinorrhea and postnasal drip.   Eyes: Negative.   Respiratory: Positive for cough. Negative for apnea, hemoptysis, choking, chest tightness, shortness of breath, wheezing and stridor.   Cardiovascular: Negative for chest pain, palpitations and leg swelling.  Gastrointestinal: Negative.  Negative for heartburn.  Genitourinary: Negative.   Musculoskeletal: Positive for myalgias. Negative for back pain, joint swelling, arthralgias and gait problem.  Skin: Negative for color change, pallor, rash and wound.  Neurological: Negative.  Negative for headaches.  Hematological: Negative for adenopathy. Does not bruise/bleed easily.  Psychiatric/Behavioral: Negative.        Objective:   Physical Exam  Vitals reviewed. Constitutional: She is oriented to person, place, and time. She appears well-developed and well-nourished.  Non-toxic appearance. She does not have a sickly appearance. She does not appear ill. No distress.  HENT:  Head: Normocephalic and  atraumatic.  Mouth/Throat: Oropharynx is clear and moist. No oropharyngeal exudate.  Eyes: Conjunctivae are normal. Right eye exhibits no discharge. Left eye exhibits no discharge. No scleral icterus.  Neck: Normal range of motion. Neck supple. No JVD present. No tracheal deviation present. No thyromegaly present.  Cardiovascular: Normal rate, regular rhythm, normal heart sounds and intact distal pulses.  Exam reveals no gallop and no friction rub.   No murmur heard. Pulmonary/Chest: Effort normal. No accessory muscle usage or stridor. Not tachypneic. No respiratory distress. She has no decreased breath sounds. She has no wheezes. She has rhonchi in the right lower field. She has no rales. She exhibits no tenderness.  Abdominal: Soft. Bowel sounds are normal. She exhibits no distension and no mass. There is no tenderness. There is no rebound and no guarding.  Musculoskeletal: Normal range of motion. She exhibits no edema and no tenderness.  Lymphadenopathy:    She has no cervical adenopathy.  Neurological: She is oriented to person, place, and time.  Skin: Skin is warm and dry. No rash noted. She is not diaphoretic. No erythema. No pallor.  Psychiatric: She has a normal mood and affect. Her behavior is normal. Judgment and thought content normal.          Assessment & Plan:

## 2012-03-02 NOTE — Assessment & Plan Note (Signed)
Start avelox for the infection and a cough suppressant 

## 2012-03-02 NOTE — Assessment & Plan Note (Signed)
I will check her CXR to look for pna, mass, edema 

## 2012-03-02 NOTE — Patient Instructions (Signed)

## 2012-03-20 ENCOUNTER — Ambulatory Visit: Payer: 59 | Admitting: Internal Medicine

## 2012-03-23 ENCOUNTER — Other Ambulatory Visit (INDEPENDENT_AMBULATORY_CARE_PROVIDER_SITE_OTHER): Payer: 59

## 2012-03-23 DIAGNOSIS — I6529 Occlusion and stenosis of unspecified carotid artery: Secondary | ICD-10-CM

## 2012-03-23 DIAGNOSIS — R5381 Other malaise: Secondary | ICD-10-CM

## 2012-03-23 DIAGNOSIS — F411 Generalized anxiety disorder: Secondary | ICD-10-CM

## 2012-03-23 DIAGNOSIS — E559 Vitamin D deficiency, unspecified: Secondary | ICD-10-CM

## 2012-03-23 DIAGNOSIS — M25519 Pain in unspecified shoulder: Secondary | ICD-10-CM

## 2012-03-23 DIAGNOSIS — I739 Peripheral vascular disease, unspecified: Secondary | ICD-10-CM

## 2012-03-23 DIAGNOSIS — M25511 Pain in right shoulder: Secondary | ICD-10-CM

## 2012-03-23 DIAGNOSIS — I10 Essential (primary) hypertension: Secondary | ICD-10-CM

## 2012-03-23 LAB — HEPATIC FUNCTION PANEL
ALT: 19 U/L (ref 0–35)
AST: 21 U/L (ref 0–37)
Bilirubin, Direct: 0 mg/dL (ref 0.0–0.3)
Total Protein: 7.1 g/dL (ref 6.0–8.3)

## 2012-03-23 LAB — LIPID PANEL
Cholesterol: 173 mg/dL (ref 0–200)
VLDL: 18 mg/dL (ref 0.0–40.0)

## 2012-03-23 LAB — HEMOGLOBIN A1C: Hgb A1c MFr Bld: 6.2 % (ref 4.6–6.5)

## 2012-03-23 LAB — BASIC METABOLIC PANEL
BUN: 16 mg/dL (ref 6–23)
Creatinine, Ser: 0.8 mg/dL (ref 0.4–1.2)
GFR: 77.53 mL/min (ref >60.00)
Potassium: 5.3 mEq/L — ABNORMAL HIGH (ref 3.5–5.1)

## 2012-03-28 ENCOUNTER — Ambulatory Visit (INDEPENDENT_AMBULATORY_CARE_PROVIDER_SITE_OTHER): Payer: 59 | Admitting: Internal Medicine

## 2012-03-28 ENCOUNTER — Encounter: Payer: Self-pay | Admitting: Internal Medicine

## 2012-03-28 VITALS — BP 118/82 | HR 64 | Temp 97.1°F | Resp 16 | Wt 134.8 lb

## 2012-03-28 DIAGNOSIS — J3489 Other specified disorders of nose and nasal sinuses: Secondary | ICD-10-CM | POA: Insufficient documentation

## 2012-03-28 DIAGNOSIS — R739 Hyperglycemia, unspecified: Secondary | ICD-10-CM

## 2012-03-28 DIAGNOSIS — E785 Hyperlipidemia, unspecified: Secondary | ICD-10-CM

## 2012-03-28 DIAGNOSIS — I10 Essential (primary) hypertension: Secondary | ICD-10-CM

## 2012-03-28 DIAGNOSIS — R220 Localized swelling, mass and lump, head: Secondary | ICD-10-CM

## 2012-03-28 DIAGNOSIS — I739 Peripheral vascular disease, unspecified: Secondary | ICD-10-CM

## 2012-03-28 DIAGNOSIS — E559 Vitamin D deficiency, unspecified: Secondary | ICD-10-CM

## 2012-03-28 DIAGNOSIS — F411 Generalized anxiety disorder: Secondary | ICD-10-CM

## 2012-03-28 DIAGNOSIS — R7309 Other abnormal glucose: Secondary | ICD-10-CM

## 2012-03-28 MED ORDER — LORAZEPAM 0.5 MG PO TABS: 0.5000 mg | ORAL_TABLET | Freq: Two times a day (BID) | ORAL | Status: DC

## 2012-03-28 NOTE — Patient Instructions (Signed)
Wt Readings from Last 3 Encounters:  03/28/12 134 lb 12 oz (61.122 kg)  03/02/12 133 lb 8 oz (60.555 kg)  01/04/12 134 lb (60.782 kg)   BP Readings from Last 3 Encounters:  03/28/12 118/82  03/02/12 138/78  01/04/12 114/74

## 2012-03-28 NOTE — Assessment & Plan Note (Signed)
Continue with current prescription therapy as reflected on the Med list.  

## 2012-03-28 NOTE — Assessment & Plan Note (Signed)
Watching labs 

## 2012-03-28 NOTE — Progress Notes (Signed)
  Subjective:    Patient ID: Sharon Paul, female    DOB: [birth year], 66 y.o.   MRN: [id number]  HPI The patient presents for a follow-up of  chronic hypertension, chronic dyslipidemia, anxiety with medicines C/o R shoulder pain  Worse in am C/o growth in L nostril  BP Readings from Last 3 Encounters:  03/28/12 118/82  03/02/12 138/78  01/04/12 114/74   Wt Readings from Last 3 Encounters:  03/28/12 134 lb 12 oz (61.122 kg)  03/02/12 133 lb 8 oz (60.555 kg)  01/04/12 134 lb (60.782 kg)         Review of Systems  Constitutional: Negative for chills, activity change, appetite change, fatigue and unexpected weight change.  HENT: Negative for congestion, mouth sores and sinus pressure.   Eyes: Negative for visual disturbance.  Respiratory: Negative for cough and chest tightness.   Gastrointestinal: Negative for nausea and abdominal pain.  Genitourinary: Negative for urgency, frequency, difficulty urinating and vaginal pain.  Musculoskeletal: Negative for back pain and gait problem.  Skin: Negative for pallor and rash.  Neurological: Negative for dizziness, tremors, weakness and headaches.  Psychiatric/Behavioral: Negative for suicidal ideas, confusion and disturbed wake/sleep cycle. The patient is nervous/anxious.        Objective:   Physical Exam  Constitutional: She appears well-developed and well-nourished. No distress.  HENT:  Head: Normocephalic.  Right Ear: External ear normal.  Left Ear: External ear normal.  Nose: Nose normal.  Mouth/Throat: Oropharynx is clear and moist.  Eyes: Conjunctivae are normal. Pupils are equal, round, and reactive to light. Right eye exhibits no discharge. Left eye exhibits no discharge.  Neck: Normal range of motion. Neck supple. No JVD present. No tracheal deviation present. No thyromegaly present.  Cardiovascular: Normal rate, regular rhythm and normal heart sounds.   Pulmonary/Chest: No stridor. No respiratory distress. She  has no wheezes.  Abdominal: Soft. Bowel sounds are normal. She exhibits no distension and no mass. There is no tenderness. There is no rebound and no guarding.  Musculoskeletal: She exhibits no edema and no tenderness.  Lymphadenopathy:    She has no cervical adenopathy.  Neurological: She displays normal reflexes. No cranial nerve deficit. She exhibits normal muscle tone. Coordination normal.  Skin: No rash noted. No erythema.  Psychiatric: She has a normal mood and affect. Her behavior is normal. Judgment and thought content normal.  wart in L nostril   Lab Results  Component Value Date   WBC 4.5 10/22/2010   HGB 13.6 10/22/2010   HCT 39.4 10/22/2010   PLT 251.0 10/22/2010   GLUCOSE 103* 03/23/2012   CHOL 173 03/23/2012   TRIG 90.0 03/23/2012   HDL 62.90 03/23/2012   LDLDIRECT 138.2 10/22/2010   LDLCALC 92 03/23/2012   ALT 19 03/23/2012   AST 21 03/23/2012   NA 141 03/23/2012   K 5.3* 03/23/2012   CL 108 03/23/2012   CREATININE 0.8 03/23/2012   BUN 16 03/23/2012   CO2 28 03/23/2012   TSH 1.51 03/23/2012   HGBA1C 6.2 03/23/2012         Assessment & Plan:

## 2012-03-28 NOTE — Assessment & Plan Note (Signed)
7/13 L nostril wart(?) ENT consult

## 2012-03-28 NOTE — Assessment & Plan Note (Signed)
Lorazepam prn Discussed

## 2012-04-09 ENCOUNTER — Other Ambulatory Visit: Payer: Self-pay | Admitting: Obstetrics and Gynecology

## 2012-04-26 ENCOUNTER — Other Ambulatory Visit: Payer: Self-pay | Admitting: Internal Medicine

## 2012-05-02 ENCOUNTER — Other Ambulatory Visit: Payer: Self-pay | Admitting: *Deleted

## 2012-05-02 ENCOUNTER — Telehealth: Payer: Self-pay | Admitting: Internal Medicine

## 2012-05-02 MED ORDER — CRESTOR 5 MG PO TABS: 5.0000 mg | ORAL_TABLET | Freq: Every day | ORAL | Status: DC

## 2012-05-02 NOTE — Telephone Encounter (Signed)
Tried to initiate PA for FLECTOR 1.3% for PT was notified by Angela Nevin at [facility] [employer] [employer] that the Pt needs to call [phone] to update information before any scripts or PA's can be processed. Pt husband notified.

## 2012-05-09 ENCOUNTER — Other Ambulatory Visit: Payer: Self-pay | Admitting: Orthopedic Surgery

## 2012-05-09 DIAGNOSIS — M79604 Pain in right leg: Secondary | ICD-10-CM

## 2012-05-09 DIAGNOSIS — M549 Dorsalgia, unspecified: Secondary | ICD-10-CM

## 2012-05-24 ENCOUNTER — Other Ambulatory Visit: Payer: Self-pay | Admitting: Otolaryngology

## 2012-06-15 ENCOUNTER — Ambulatory Visit (INDEPENDENT_AMBULATORY_CARE_PROVIDER_SITE_OTHER): Payer: 59 | Admitting: Internal Medicine

## 2012-06-15 ENCOUNTER — Encounter: Payer: Self-pay | Admitting: Internal Medicine

## 2012-06-15 VITALS — BP 130/88 | HR 76 | Temp 97.8°F | Resp 16 | Wt 135.5 lb

## 2012-06-15 DIAGNOSIS — M5431 Sciatica, right side: Secondary | ICD-10-CM

## 2012-06-15 DIAGNOSIS — M5430 Sciatica, unspecified side: Secondary | ICD-10-CM

## 2012-06-15 DIAGNOSIS — M545 Low back pain: Secondary | ICD-10-CM

## 2012-06-15 MED ORDER — TRAMADOL HCL 50 MG PO TABS: 50.0000 mg | ORAL_TABLET | Freq: Three times a day (TID) | ORAL | Status: DC | PRN

## 2012-06-15 MED ORDER — METHYLPREDNISOLONE ACETATE 80 MG/ML IJ SUSP
120.0000 mg | Freq: Once | INTRAMUSCULAR | Status: AC
  Administered 2012-06-15: 120 mg via INTRAMUSCULAR

## 2012-06-15 NOTE — Patient Instructions (Signed)
Sciatica Sciatica is pain, weakness, numbness, or tingling along the path of the sciatic nerve. The nerve starts in the lower back and runs down the back of each leg. The nerve controls the muscles in the lower leg and in the back of the knee, while also providing sensation to the back of the thigh, lower leg, and the sole of your foot. Sciatica is a symptom of another medical condition. For instance, nerve damage or certain conditions, such as a herniated disk or bone spur on the spine, pinch or put pressure on the sciatic nerve. This causes the pain, weakness, or other sensations normally associated with sciatica. Generally, sciatica only affects one side of the body. CAUSES   Herniated or slipped disc.  Degenerative disk disease.  A pain disorder involving the narrow muscle in the buttocks (piriformis syndrome).  Pelvic injury or fracture.  Pregnancy.  Tumor (rare). SYMPTOMS  Symptoms can vary from mild to very severe. The symptoms usually travel from the low back to the buttocks and down the back of the leg. Symptoms can include:  Mild tingling or dull aches in the lower back, leg, or hip.  Numbness in the back of the calf or sole of the foot.  Burning sensations in the lower back, leg, or hip.  Sharp pains in the lower back, leg, or hip.  Leg weakness.  Severe back pain inhibiting movement. These symptoms may get worse with coughing, sneezing, laughing, or prolonged sitting or standing. Also, being overweight may worsen symptoms. DIAGNOSIS  Your caregiver will perform a physical exam to look for common symptoms of sciatica. He or she may ask you to do certain movements or activities that would trigger sciatic nerve pain. Other tests may be performed to find the cause of the sciatica. These may include:  Blood tests.  X-rays.  Imaging tests, such as an MRI or CT scan. TREATMENT  Treatment is directed at the cause of the sciatic pain. Sometimes, treatment is not necessary  and the pain and discomfort goes away on its own. If treatment is needed, your caregiver may suggest:  Over-the-counter medicines to relieve pain.  Prescription medicines, such as anti-inflammatory medicine, muscle relaxants, or narcotics.  Applying heat or ice to the painful area.  Steroid injections to lessen pain, irritation, and inflammation around the nerve.  Reducing activity during periods of pain.  Exercising and stretching to strengthen your abdomen and improve flexibility of your spine. Your caregiver may suggest losing weight if the extra weight makes the back pain worse.  Physical therapy.  Surgery to eliminate what is pressing or pinching the nerve, such as a bone spur or part of a herniated disk. HOME CARE INSTRUCTIONS   Only take over-the-counter or prescription medicines for pain or discomfort as directed by your caregiver.  Apply ice to the affected area for 20 minutes, 3 4 times a day for the first 48 72 hours. Then try heat in the same way.  Exercise, stretch, or perform your usual activities if these do not aggravate your pain.  Attend physical therapy sessions as directed by your caregiver.  Keep all follow-up appointments as directed by your caregiver.  Do not wear high heels or shoes that do not provide proper support.  Check your mattress to see if it is too soft. A firm mattress may lessen your pain and discomfort. SEEK IMMEDIATE MEDICAL CARE IF:   You lose control of your bowel or bladder (incontinence).  You have increasing weakness in the lower back,   pelvis, buttocks, or legs.  You have redness or swelling of your back.  You have a burning sensation when you urinate.  You have pain that gets worse when you lie down or awakens you at night.  Your pain is worse than you have experienced in the past.  Your pain is lasting longer than 4 weeks.  You are suddenly losing weight without reason. MAKE SURE YOU:  Understand these  instructions.  Will watch your condition.  Will get help right away if you are not doing well or get worse. Document Released: 08/09/2001 Document Revised: 02/14/2012 Document Reviewed: 12/25/2011 [employer] Patient Information 2013 [employer].  

## 2012-06-15 NOTE — Assessment & Plan Note (Signed)
I think she has exacerbated her bulging disc so I gave her an injection of depo-medrol IM to help reduce the pain and inflammation, she will also try tramadol and start PT

## 2012-06-15 NOTE — Progress Notes (Signed)
Subjective:    Patient ID: Sharon Paul, female    DOB: [birth year], 66 y.o.   MRN: [id number]  Back Pain This is a recurrent problem. Episode onset: 5 days ago. The problem occurs constantly. The problem is unchanged. The pain is present in the lumbar spine. The quality of the pain is described as stabbing and shooting. The pain radiates to the right thigh. The pain is at a severity of 7/10. The pain is moderate. The pain is the same all the time. The symptoms are aggravated by position. Stiffness is present all day. Associated symptoms include leg pain (right buttocks and right thigh). Pertinent negatives include no abdominal pain, bladder incontinence, bowel incontinence, chest pain, dysuria, fever, headaches, numbness, paresis, paresthesias, pelvic pain, perianal numbness, tingling, weakness or weight loss. Risk factors include obesity and lack of exercise. She has tried NSAIDs and home exercises for the symptoms. The treatment provided no relief.      Review of Systems  Constitutional: Negative for fever, chills, weight loss, diaphoresis, activity change, appetite change, fatigue and unexpected weight change.  HENT: Negative.   Eyes: Negative.   Respiratory: Negative for cough, chest tightness, shortness of breath, wheezing and stridor.   Cardiovascular: Negative for chest pain, palpitations and leg swelling.  Gastrointestinal: Negative for nausea, vomiting, abdominal pain, diarrhea, constipation and bowel incontinence.  Genitourinary: Negative.  Negative for bladder incontinence, dysuria, urgency, frequency, flank pain, enuresis and pelvic pain.  Musculoskeletal: Positive for back pain and arthralgias. Negative for myalgias, joint swelling and gait problem.  Skin: Negative for color change, pallor, rash and wound.  Neurological: Negative.  Negative for dizziness, tingling, tremors, seizures, syncope, facial asymmetry, speech difficulty, weakness, light-headedness, numbness,  headaches and paresthesias.  Hematological: Negative for adenopathy. Does not bruise/bleed easily.  Psychiatric/Behavioral: Negative.        Objective:   Physical Exam  Vitals reviewed. Constitutional: She appears well-developed and well-nourished. No distress.  HENT:  Head: Normocephalic and atraumatic.  Mouth/Throat: Oropharynx is clear and moist. No oropharyngeal exudate.  Eyes: Conjunctivae normal are normal. Right eye exhibits no discharge. Left eye exhibits no discharge. No scleral icterus.  Neck: Normal range of motion. Neck supple. No JVD present. No tracheal deviation present. No thyromegaly present.  Cardiovascular: Normal rate, regular rhythm, normal heart sounds and intact distal pulses.  Exam reveals no gallop and no friction rub.   No murmur heard. Pulmonary/Chest: Effort normal and breath sounds normal. No stridor. No respiratory distress. She has no wheezes. She has no rales. She exhibits no tenderness.  Abdominal: Soft. Bowel sounds are normal. She exhibits no distension and no mass. There is no tenderness. There is no rebound and no guarding.  Musculoskeletal:       Lumbar back: Normal. She exhibits normal range of motion, no tenderness, no bony tenderness, no swelling, no edema, no deformity, no laceration, no pain, no spasm and normal pulse.  Lymphadenopathy:    She has no cervical adenopathy.  Neurological: She is alert. She has normal strength. She displays no atrophy, no tremor and normal reflexes. No cranial nerve deficit or sensory deficit. She exhibits normal muscle tone. She displays a negative Romberg sign. She displays no seizure activity. Coordination and gait normal. She displays no Babinski's sign on the right side. She displays no Babinski's sign on the left side.  Reflex Scores:      Tricep reflexes are 1+ on the right side and 1+ on the left side.      Bicep reflexes are 1+  on the right side and 1+ on the left side.      Brachioradialis reflexes are 1+ on  the right side and 1+ on the left side.      Patellar reflexes are 1+ on the right side and 1+ on the left side.      Achilles reflexes are 1+ on the right side and 1+ on the left side.      - SLR in BLE  Skin: Skin is warm and dry. No rash noted. She is not diaphoretic. No erythema. No pallor.  Psychiatric: She has a normal mood and affect. Her behavior is normal. Judgment and thought content normal.          Assessment & Plan:

## 2012-06-15 NOTE — Assessment & Plan Note (Signed)
Will try tramadol and have asked her to start PT

## 2012-07-04 ENCOUNTER — Telehealth: Payer: Self-pay | Admitting: Internal Medicine

## 2012-07-04 ENCOUNTER — Ambulatory Visit (INDEPENDENT_AMBULATORY_CARE_PROVIDER_SITE_OTHER): Payer: 59 | Admitting: Internal Medicine

## 2012-07-04 ENCOUNTER — Encounter: Payer: Self-pay | Admitting: Internal Medicine

## 2012-07-04 VITALS — BP 150/74 | HR 80 | Temp 97.0°F | Resp 16 | Wt 133.0 lb

## 2012-07-04 DIAGNOSIS — J3489 Other specified disorders of nose and nasal sinuses: Secondary | ICD-10-CM

## 2012-07-04 DIAGNOSIS — F411 Generalized anxiety disorder: Secondary | ICD-10-CM

## 2012-07-04 DIAGNOSIS — K219 Gastro-esophageal reflux disease without esophagitis: Secondary | ICD-10-CM

## 2012-07-04 DIAGNOSIS — R220 Localized swelling, mass and lump, head: Secondary | ICD-10-CM

## 2012-07-04 DIAGNOSIS — I10 Essential (primary) hypertension: Secondary | ICD-10-CM

## 2012-07-04 DIAGNOSIS — Z23 Encounter for immunization: Secondary | ICD-10-CM

## 2012-07-04 MED ORDER — PODOFILOX 0.5 % EX GEL: Freq: Two times a day (BID) | CUTANEOUS | Status: DC

## 2012-07-04 NOTE — Assessment & Plan Note (Signed)
Continue with current prescription therapy as reflected on the Med list.  

## 2012-07-04 NOTE — Assessment & Plan Note (Signed)
E could try condylox if re-occurs

## 2012-07-04 NOTE — Telephone Encounter (Signed)
LMOM to inform patient. 

## 2012-07-04 NOTE — Telephone Encounter (Signed)
Pt wants to know if she needs labs before her March appt.

## 2012-07-04 NOTE — Progress Notes (Signed)
   Subjective:    Patient ID: Sharon Paul, female    DOB: November 14, 1945, 66 y.o.   MRN: [id number]  HPI The patient presents for a follow-up of  chronic hypertension, chronic dyslipidemia, anxiety with medicines C/o R shoulder pain - resolved. She had a LBP w/a R sciatica - she saw Dr Leslee Home, had a PT C/o growth in L nostril -- she saw Dr Pollyann Kennedy and the lesion was removed  BP Readings from Last 3 Encounters:  07/04/12 150/72  06/15/12 130/88  03/28/12 118/82   Wt Readings from Last 3 Encounters:  07/04/12 133 lb (60.328 kg)  06/15/12 135 lb 8 oz (61.462 kg)  03/28/12 134 lb 12 oz (61.122 kg)         Review of Systems  Constitutional: Negative for chills, activity change, appetite change, fatigue and unexpected weight change.  HENT: Negative for congestion, mouth sores and sinus pressure.   Eyes: Negative for visual disturbance.  Respiratory: Negative for cough and chest tightness.   Gastrointestinal: Negative for nausea and abdominal pain.  Genitourinary: Negative for urgency, frequency, difficulty urinating and vaginal pain.  Musculoskeletal: Negative for back pain and gait problem.  Skin: Negative for pallor and rash.  Neurological: Negative for dizziness, tremors, weakness and headaches.  Psychiatric/Behavioral: Negative for suicidal ideas, confusion and sleep disturbance. The patient is nervous/anxious.        Objective:   Physical Exam  Constitutional: She appears well-developed and well-nourished. No distress.  HENT:  Head: Normocephalic.  Right Ear: External ear normal.  Left Ear: External ear normal.  Nose: Nose normal.  Mouth/Throat: Oropharynx is clear and moist.  Eyes: Conjunctivae normal are normal. Pupils are equal, round, and reactive to light. Right eye exhibits no discharge. Left eye exhibits no discharge.  Neck: Normal range of motion. Neck supple. No JVD present. No tracheal deviation present. No thyromegaly present.  Cardiovascular:  Normal rate, regular rhythm and normal heart sounds.   Pulmonary/Chest: No stridor. No respiratory distress. She has no wheezes.  Abdominal: Soft. Bowel sounds are normal. She exhibits no distension and no mass. There is no tenderness. There is no rebound and no guarding.  Musculoskeletal: She exhibits no edema and no tenderness.  Lymphadenopathy:    She has no cervical adenopathy.  Neurological: She displays normal reflexes. No cranial nerve deficit. She exhibits normal muscle tone. Coordination normal.  Skin: No rash noted. No erythema.  Psychiatric: She has a normal mood and affect. Her behavior is normal. Judgment and thought content normal.  wart in L nostril - treated   Lab Results  Component Value Date   WBC 4.5 10/22/2010   HGB 13.6 10/22/2010   HCT 39.4 10/22/2010   PLT 251.0 10/22/2010   GLUCOSE 103* 03/23/2012   CHOL 173 03/23/2012   TRIG 90.0 03/23/2012   HDL 62.90 03/23/2012   LDLDIRECT 138.2 10/22/2010   LDLCALC 92 03/23/2012   ALT 19 03/23/2012   AST 21 03/23/2012   NA 141 03/23/2012   K 5.3* 03/23/2012   CL 108 03/23/2012   CREATININE 0.8 03/23/2012   BUN 16 03/23/2012   CO2 28 03/23/2012   TSH 1.51 03/23/2012   HGBA1C 6.2 03/23/2012         Assessment & Plan:

## 2012-07-04 NOTE — Telephone Encounter (Signed)
BMET Thx 

## 2012-07-20 ENCOUNTER — Telehealth: Payer: Self-pay | Admitting: *Deleted

## 2012-07-20 ENCOUNTER — Ambulatory Visit (INDEPENDENT_AMBULATORY_CARE_PROVIDER_SITE_OTHER): Payer: 59 | Admitting: Obstetrics and Gynecology

## 2012-07-20 DIAGNOSIS — N899 Noninflammatory disorder of vagina, unspecified: Secondary | ICD-10-CM

## 2012-07-20 DIAGNOSIS — N898 Other specified noninflammatory disorders of vagina: Secondary | ICD-10-CM

## 2012-07-20 LAB — WET PREP FOR TRICH, YEAST, CLUE
Clue Cells Wet Prep HPF POC: NONE SEEN
Trich, Wet Prep: NONE SEEN
Yeast Wet Prep HPF POC: NONE SEEN

## 2012-07-20 MED ORDER — ESTRADIOL 0.1 MG/GM VA CREA: TOPICAL_CREAM | VAGINAL | Status: DC

## 2012-07-20 MED ORDER — TERCONAZOLE 0.8 % VA CREA: 1.0000 | TOPICAL_CREAM | Freq: Every day | VAGINAL | Status: DC

## 2012-07-20 NOTE — Telephone Encounter (Signed)
Pt decided she would like to have estrace vaginal cream that you spoke with her about in OV today. Please advise

## 2012-07-20 NOTE — Patient Instructions (Signed)
Call back if you want vaginal estrogen.

## 2012-07-20 NOTE — Telephone Encounter (Signed)
Pt informed with the below note,she will start after taking yeast RX.

## 2012-07-20 NOTE — Telephone Encounter (Signed)
Estrace vaginal cream. 1 g at bedtime in her vagina 3 times a week. Start  after she finishes yeast medication.

## 2012-07-20 NOTE — Progress Notes (Signed)
Patient came in today complaining of vaginal burning. She is not sure whether it is dryness or a yeast infection. She is unaware of a discharge. It isn't true itching. She is on 0.025 mg Climara patches. She has not been on antibiotics.  Exam: Kennon Portela present. External: Within normal limits. BUS: Within normal limits. Vaginal exam: Excellent estrogen effect. Cottage cheesy like discharge. Wet prep negative.  Assessment: Yeast vaginitis. Possible atrophic vaginitis.  Plan: Patient treated with terconazole 3 cream. She is convinced that there is also a vaginal dryness component. We discussed increasing her patch to .0375 mg. She has two months of them and did not want to do that now. Information on hyalo GYN gel given. If that doesn't work she will call for vaginal estrogen.

## 2012-07-20 NOTE — Addendum Note (Signed)
Addended by: Aura Camps on: 07/20/2012 03:01 PM   Modules accepted: Orders

## 2012-07-23 ENCOUNTER — Other Ambulatory Visit: Payer: Self-pay | Admitting: *Deleted

## 2012-07-23 MED ORDER — IRBESARTAN 300 MG PO TABS: 150.0000 mg | ORAL_TABLET | Freq: Two times a day (BID) | ORAL | Status: DC

## 2012-07-27 ENCOUNTER — Telehealth: Payer: Self-pay | Admitting: Internal Medicine

## 2012-07-27 MED ORDER — ACYCLOVIR 400 MG PO TABS: 400.0000 mg | ORAL_TABLET | Freq: Three times a day (TID) | ORAL | Status: AC

## 2012-07-27 MED ORDER — ACYCLOVIR 400 MG PO TABS: 400.0000 mg | ORAL_TABLET | Freq: Three times a day (TID) | ORAL | Status: DC

## 2012-07-27 NOTE — Telephone Encounter (Signed)
Patient Information:  Caller Name: [place]  Phone: [phone]  Patient: Sharon Paul, Sharon Paul  Gender: Female  DOB: 1945-09-13  Age: 66 Years  PCP: Sonda Primes (Adults only)   Symptoms  Reason For Call & Symptoms: has fever blisters all across her upper lip , started on Monday 11/25 with dry llips and then on Thursday 11/28 developed the blisters  Reviewed Health History In EMR: Yes  Reviewed Medications In EMR: Yes  Reviewed Allergies In EMR: Yes  Reviewed Surgeries / Procedures: Yes  Date of Onset of Symptoms: 07/23/2012  Treatments Tried: Reuben Likes  Treatments Tried Worked: No  Guideline(s) Used:  Cold Sores - Fever Blisters of Lip  Disposition Per Guideline:   Home Care  Reason For Disposition Reached:   Cold sores without complications  Advice Given:  Contagiousness:  Herpes from cold sores is contagious to other people. Discourage picking or rubbing the sore. Don't open the blisters. Wash your hands frequently. The cold sores are contagious until dry (approximately 5-7 days). Most cold sore sufferers note a tingling in the lip before the sore appears (prodromal phase). Patients are also contagious during this period.  Docosanol 10% Cream:  Apply over-the-counter docosanol cream (trade name Abreva) to the cold sore 5 times daily until healing occurs.  General Information - Cold Sores  Fever blisters or cold sores occur on one side of the outer lip.  Typically last 7-10 days.  Call Back If:  Sores look infected (spreading redness)  Sores occur near or in the eye  You become worse  Office Follow Up:  Does the office need to follow up with this patient?: Yes  Instructions For The Office: asking for medication for the cold sores, states that MD had offered before but she didn't need it; this is the worst she has ever had  [facility] on [address]. Her best contact # this afternoon is [phone].

## 2012-07-27 NOTE — Telephone Encounter (Signed)
Done and sent to [facility].

## 2012-07-27 NOTE — Telephone Encounter (Signed)
Pt advised of Rx/pharmacy 

## 2012-08-13 ENCOUNTER — Ambulatory Visit (INDEPENDENT_AMBULATORY_CARE_PROVIDER_SITE_OTHER): Payer: 59 | Admitting: Internal Medicine

## 2012-08-13 ENCOUNTER — Encounter: Payer: Self-pay | Admitting: Internal Medicine

## 2012-08-13 VITALS — BP 130/78 | HR 75 | Temp 98.4°F | Resp 16 | Wt 136.0 lb

## 2012-08-13 DIAGNOSIS — M546 Pain in thoracic spine: Secondary | ICD-10-CM | POA: Insufficient documentation

## 2012-08-13 MED ORDER — METHOCARBAMOL 500 MG PO TABS
500.0000 mg | ORAL_TABLET | Freq: Four times a day (QID) | ORAL | Status: DC
Start: 2012-08-13 — End: 2012-10-15

## 2012-08-13 MED ORDER — OXYCODONE HCL 5 MG PO TABS: 5.0000 mg | ORAL_TABLET | ORAL | Status: DC | PRN

## 2012-08-13 NOTE — Patient Instructions (Signed)
Back Pain, Adult Low back pain is very common. About 1 in 5 people have back pain.The cause of low back pain is rarely dangerous. The pain often gets better over time.About half of people with a sudden onset of back pain feel better in just 2 weeks. About 8 in 10 people feel better by 6 weeks.  CAUSES Some common causes of back pain include:  Strain of the muscles or ligaments supporting the spine.  Wear and tear (degeneration) of the spinal discs.  Arthritis.  Direct injury to the back. DIAGNOSIS Most of the time, the direct cause of low back pain is not known.However, back pain can be treated effectively even when the exact cause of the pain is unknown.Answering your caregiver's questions about your overall health and symptoms is one of the most accurate ways to make sure the cause of your pain is not dangerous. If your caregiver needs more information, he or she may order lab work or imaging tests (X-rays or MRIs).However, even if imaging tests show changes in your back, this usually does not require surgery. HOME CARE INSTRUCTIONS For many people, back pain returns.Since low back pain is rarely dangerous, it is often a condition that people can learn to manageon their own.   Remain active. It is stressful on the back to sit or stand in one place. Do not sit, drive, or stand in one place for more than 30 minutes at a time. Take short walks on level surfaces as soon as pain allows.Try to increase the length of time you walk each day.  Do not stay in bed.Resting more than 1 or 2 days can delay your recovery.  Do not avoid exercise or work.Your body is made to move.It is not dangerous to be active, even though your back may hurt.Your back will likely heal faster if you return to being active before your pain is gone.  Pay attention to your body when you bend and lift. Many people have less discomfortwhen lifting if they bend their knees, keep the load close to their bodies,and  avoid twisting. Often, the most comfortable positions are those that put less stress on your recovering back.  Find a comfortable position to sleep. Use a firm mattress and lie on your side with your knees slightly bent. If you lie on your back, put a pillow under your knees.  Only take over-the-counter or prescription medicines as directed by your caregiver. Over-the-counter medicines to reduce pain and inflammation are often the most helpful.Your caregiver may prescribe muscle relaxant drugs.These medicines help dull your pain so you can more quickly return to your normal activities and healthy exercise.  Put ice on the injured area.  Put ice in a plastic bag.  Place a towel between your skin and the bag.  Leave the ice on for 15 to 20 minutes, 3 to 4 times a day for the first 2 to 3 days. After that, ice and heat may be alternated to reduce pain and spasms.  Ask your caregiver about trying back exercises and gentle massage. This may be of some benefit.  Avoid feeling anxious or stressed.Stress increases muscle tension and can worsen back pain.It is important to recognize when you are anxious or stressed and learn ways to manage it.Exercise is a great option. SEEK MEDICAL CARE IF:  You have pain that is not relieved with rest or medicine.  You have pain that does not improve in 1 week.  You have new symptoms.  You are generally   not feeling well. SEEK IMMEDIATE MEDICAL CARE IF:   You have pain that radiates from your back into your legs.  You develop new bowel or bladder control problems.  You have unusual weakness or numbness in your arms or legs.  You develop nausea or vomiting.  You develop abdominal pain.  You feel faint. Document Released: 08/15/2005 Document Revised: 02/14/2012 Document Reviewed: 01/03/2011 [employer] Patient Information 2013 [employer].  

## 2012-08-13 NOTE — Assessment & Plan Note (Signed)
She will continue the nsaid, I have also offered her robaxin and oxycodone for pain relief

## 2012-08-13 NOTE — Progress Notes (Signed)
Subjective:    Patient ID: Sharon Paul, female    DOB: [birth year], 66 y.o.   MRN: [id number]  Back Pain This is a new problem. The current episode started in the past 7 days. The problem occurs intermittently. The problem is unchanged. The pain is present in the thoracic spine. The quality of the pain is described as stabbing and shooting. Radiates to: to right shoulder blade. The pain is at a severity of 8/10. The pain is severe. The pain is worse during the night. The symptoms are aggravated by position. Pertinent negatives include no abdominal pain, bladder incontinence, bowel incontinence, chest pain, dysuria, fever, headaches, leg pain, numbness, paresis, paresthesias, pelvic pain, perianal numbness, tingling, weakness or weight loss. Risk factors: pain started the day after she saw a massage therapist that manipulated the area. She has tried NSAIDs for the symptoms. The treatment provided mild relief.      Review of Systems  Constitutional: Negative.  Negative for fever and weight loss.  HENT: Negative.   Eyes: Negative.   Respiratory: Negative for shortness of breath, wheezing and stridor.   Cardiovascular: Negative for chest pain, palpitations and leg swelling.  Gastrointestinal: Negative for nausea, vomiting, abdominal pain, diarrhea, constipation and bowel incontinence.  Genitourinary: Negative for bladder incontinence, dysuria and pelvic pain.  Musculoskeletal: Positive for back pain. Negative for myalgias, joint swelling, arthralgias and gait problem.  Skin: Negative for color change, pallor, rash and wound.  Neurological: Negative for dizziness, tingling, tremors, seizures, syncope, facial asymmetry, speech difficulty, weakness, numbness, headaches and paresthesias.  Hematological: Negative for adenopathy. Does not bruise/bleed easily.  Psychiatric/Behavioral: Negative.        Objective:   Physical Exam  Vitals reviewed. Constitutional: She is oriented to person,  place, and time. She appears well-developed and well-nourished.  Non-toxic appearance. She does not have a sickly appearance. She does not appear ill. No distress.  HENT:  Head: Normocephalic and atraumatic.  Mouth/Throat: No oropharyngeal exudate.  Eyes: Conjunctivae normal are normal. Right eye exhibits no discharge. Left eye exhibits no discharge. No scleral icterus.  Neck: Normal range of motion. Neck supple. No JVD present. No tracheal deviation present. No thyromegaly present.  Cardiovascular: Normal rate, regular rhythm, normal heart sounds and intact distal pulses.  Exam reveals no gallop and no friction rub.   No murmur heard. Pulmonary/Chest: Effort normal and breath sounds normal. No stridor. No respiratory distress. She has no wheezes. She has no rales. She exhibits no tenderness.  Abdominal: Soft. Bowel sounds are normal. She exhibits no distension and no mass. There is no tenderness. There is no rebound and no guarding.  Musculoskeletal: Normal range of motion. She exhibits no edema and no tenderness.       Thoracic back: Normal. She exhibits normal range of motion, no tenderness, no bony tenderness, no swelling, no edema, no deformity, no laceration, no pain, no spasm and normal pulse.       Arms: Lymphadenopathy:    She has no cervical adenopathy.  Neurological: She is alert and oriented to person, place, and time. She has normal strength. She displays no atrophy, no tremor and normal reflexes. No cranial nerve deficit or sensory deficit. She exhibits normal muscle tone. She displays a negative Romberg sign. She displays no seizure activity. Coordination and gait normal. She displays no Babinski's sign on the right side. She displays no Babinski's sign on the left side.  Reflex Scores:      Tricep reflexes are 1+ on the right side and  1+ on the left side.      Bicep reflexes are 1+ on the left side.      Brachioradialis reflexes are 1+ on the right side and 1+ on the left side.       Patellar reflexes are 1+ on the right side and 1+ on the left side.      Achilles reflexes are 1+ on the right side and 1+ on the left side. Skin: Skin is warm and dry. No rash noted. She is not diaphoretic. No erythema. No pallor.  Psychiatric: She has a normal mood and affect. Her behavior is normal. Judgment and thought content normal.     Lab Results  Component Value Date   WBC 4.5 10/22/2010   HGB 13.6 10/22/2010   HCT 39.4 10/22/2010   PLT 251.0 10/22/2010   GLUCOSE 103* 03/23/2012   CHOL 173 03/23/2012   TRIG 90.0 03/23/2012   HDL 62.90 03/23/2012   LDLDIRECT 138.2 10/22/2010   LDLCALC 92 03/23/2012   ALT 19 03/23/2012   AST 21 03/23/2012   NA 141 03/23/2012   K 5.3* 03/23/2012   CL 108 03/23/2012   CREATININE 0.8 03/23/2012   BUN 16 03/23/2012   CO2 28 03/23/2012   TSH 1.51 03/23/2012   HGBA1C 6.2 03/23/2012       Assessment & Plan:

## 2012-08-27 ENCOUNTER — Other Ambulatory Visit: Payer: Self-pay | Admitting: Internal Medicine

## 2012-09-10 ENCOUNTER — Ambulatory Visit: Payer: 59 | Admitting: Internal Medicine

## 2012-09-11 ENCOUNTER — Other Ambulatory Visit: Payer: Self-pay | Admitting: *Deleted

## 2012-09-11 MED ORDER — FLUOXETINE HCL 10 MG PO CAPS: 10.0000 mg | ORAL_CAPSULE | Freq: Every day | ORAL | Status: DC

## 2012-09-18 ENCOUNTER — Telehealth: Payer: Self-pay | Admitting: *Deleted

## 2012-09-18 NOTE — Telephone Encounter (Signed)
Ok 12 mo Thx

## 2012-09-18 NOTE — Telephone Encounter (Signed)
Rf req for Omeprazole cap for 90 day supply. Med is not active on list. Pantoprazole is active. Please advise

## 2012-09-19 MED ORDER — OMEPRAZOLE 40 MG PO CPDR: 40.0000 mg | DELAYED_RELEASE_CAPSULE | Freq: Every day | ORAL | Status: DC

## 2012-09-19 NOTE — Telephone Encounter (Signed)
Done

## 2012-09-21 ENCOUNTER — Other Ambulatory Visit: Payer: Self-pay

## 2012-09-21 MED ORDER — OMEPRAZOLE 40 MG PO CPDR: 40.0000 mg | DELAYED_RELEASE_CAPSULE | Freq: Every day | ORAL | Status: DC

## 2012-10-11 ENCOUNTER — Ambulatory Visit: Payer: 59 | Admitting: Internal Medicine

## 2012-10-12 ENCOUNTER — Other Ambulatory Visit: Payer: Self-pay | Admitting: Internal Medicine

## 2012-10-15 ENCOUNTER — Ambulatory Visit (INDEPENDENT_AMBULATORY_CARE_PROVIDER_SITE_OTHER): Payer: 59

## 2012-10-15 ENCOUNTER — Ambulatory Visit (INDEPENDENT_AMBULATORY_CARE_PROVIDER_SITE_OTHER): Payer: 59 | Admitting: Women's Health

## 2012-10-15 VITALS — BP 134/80

## 2012-10-15 DIAGNOSIS — R142 Eructation: Secondary | ICD-10-CM

## 2012-10-15 DIAGNOSIS — N83339 Acquired atrophy of ovary and fallopian tube, unspecified side: Secondary | ICD-10-CM

## 2012-10-15 DIAGNOSIS — R140 Abdominal distension (gaseous): Secondary | ICD-10-CM

## 2012-10-15 DIAGNOSIS — N949 Unspecified condition associated with female genital organs and menstrual cycle: Secondary | ICD-10-CM

## 2012-10-15 DIAGNOSIS — R102 Pelvic and perineal pain: Secondary | ICD-10-CM

## 2012-10-15 DIAGNOSIS — R141 Gas pain: Secondary | ICD-10-CM

## 2012-10-15 MED ORDER — VALACYCLOVIR HCL 500 MG PO TABS: ORAL_TABLET | ORAL | Status: DC

## 2012-10-15 NOTE — Progress Notes (Signed)
Patient ID: Sharon Paul, female   DOB: [birth year], 67 y.o.   MRN: [id number] Presents with complaint of intermittent right lower quadrant pain with bloating and gas. Currently on Climara patch 0.25 weekly/hysterectomy. Removed patch last week, pain did decrease after having off. Reports pain minimal today. Also states has always had GI problems with gas. Denies urinary symptoms, discharge, nausea, constipation, or fever. Eats a high fiber diet.  Exam: Appears well. Abdomen soft, no rebound or radiation of pain. Ultrasound: Right and left ovary atrophic. Noted excessive bowel gas in the right and left adnexal. Negative cul-de-sac.  Right lower quadrant pain probably gas related ERT  Plan: Instructed to keep a food diary, decrease amounts of the foods causing discomfort, increase regular exercise to help with digestion. Will continue Climara 0.25 weekly, states has increased hot flushes and poor sleep when off.

## 2012-10-24 ENCOUNTER — Other Ambulatory Visit: Payer: Self-pay | Admitting: Internal Medicine

## 2012-10-24 ENCOUNTER — Other Ambulatory Visit (INDEPENDENT_AMBULATORY_CARE_PROVIDER_SITE_OTHER): Payer: 59

## 2012-10-24 LAB — BASIC METABOLIC PANEL
BUN: 14 mg/dL (ref 6–23)
Chloride: 105 mEq/L (ref 96–112)
Creatinine, Ser: 0.8 mg/dL (ref 0.4–1.2)

## 2012-10-31 ENCOUNTER — Encounter: Payer: Self-pay | Admitting: Internal Medicine

## 2012-10-31 ENCOUNTER — Ambulatory Visit (INDEPENDENT_AMBULATORY_CARE_PROVIDER_SITE_OTHER): Payer: 59 | Admitting: Internal Medicine

## 2012-10-31 VITALS — BP 135/85 | HR 76 | Temp 97.9°F | Resp 16 | Wt 134.0 lb

## 2012-10-31 DIAGNOSIS — I10 Essential (primary) hypertension: Secondary | ICD-10-CM

## 2012-10-31 DIAGNOSIS — E785 Hyperlipidemia, unspecified: Secondary | ICD-10-CM

## 2012-10-31 DIAGNOSIS — E559 Vitamin D deficiency, unspecified: Secondary | ICD-10-CM

## 2012-10-31 DIAGNOSIS — R220 Localized swelling, mass and lump, head: Secondary | ICD-10-CM

## 2012-10-31 DIAGNOSIS — F411 Generalized anxiety disorder: Secondary | ICD-10-CM

## 2012-10-31 DIAGNOSIS — K219 Gastro-esophageal reflux disease without esophagitis: Secondary | ICD-10-CM

## 2012-10-31 NOTE — Assessment & Plan Note (Signed)
Chronic, difficult to control White coat HTN component Continue with current prescription therapy as reflected on the Med list.

## 2012-10-31 NOTE — Assessment & Plan Note (Signed)
Continue with current prescription therapy as reflected on the Med list.  

## 2012-10-31 NOTE — Assessment & Plan Note (Signed)
Continue with current prescription therapy as reflected on the Med list. Doing well 

## 2012-10-31 NOTE — Progress Notes (Signed)
   Subjective:    HPI The patient presents for a follow-up of  chronic hypertension, chronic dyslipidemia, anxiety with medicines C/o R shoulder pain - resolved. She had a LBP w/a R sciatica - she saw Dr Leslee Home, had a PT - doing well now  BP is ok at home; stress is better  BP Readings from Last 3 Encounters:  10/31/12 160/90  10/15/12 134/80  08/13/12 130/78   Wt Readings from Last 3 Encounters:  10/31/12 134 lb (60.782 kg)  08/13/12 136 lb (61.689 kg)  07/04/12 133 lb (60.328 kg)         Review of Systems  Constitutional: Negative for chills, activity change, appetite change, fatigue and unexpected weight change.  HENT: Negative for congestion, mouth sores and sinus pressure.   Eyes: Negative for visual disturbance.  Respiratory: Negative for cough and chest tightness.   Gastrointestinal: Negative for nausea and abdominal pain.  Genitourinary: Negative for urgency, frequency, difficulty urinating and vaginal pain.  Musculoskeletal: Negative for back pain and gait problem.  Skin: Negative for pallor and rash.  Neurological: Negative for dizziness, tremors, weakness and headaches.  Psychiatric/Behavioral: Negative for suicidal ideas, confusion and sleep disturbance. The patient is nervous/anxious.        Objective:   Physical Exam  Constitutional: She appears well-developed and well-nourished. No distress.  HENT:  Head: Normocephalic.  Right Ear: External ear normal.  Left Ear: External ear normal.  Nose: Nose normal.  Mouth/Throat: Oropharynx is clear and moist.  Eyes: Conjunctivae are normal. Pupils are equal, round, and reactive to light. Right eye exhibits no discharge. Left eye exhibits no discharge.  Neck: Normal range of motion. Neck supple. No JVD present. No tracheal deviation present. No thyromegaly present.  Cardiovascular: Normal rate, regular rhythm and normal heart sounds.   Pulmonary/Chest: No stridor. No respiratory distress. She has no wheezes.   Abdominal: Soft. Bowel sounds are normal. She exhibits no distension and no mass. There is no tenderness. There is no rebound and no guarding.  Musculoskeletal: She exhibits no edema and no tenderness.  Lymphadenopathy:    She has no cervical adenopathy.  Neurological: She displays normal reflexes. No cranial nerve deficit. She exhibits normal muscle tone. Coordination normal.  Skin: No rash noted. No erythema.  Psychiatric: She has a normal mood and affect. Her behavior is normal. Judgment and thought content normal.    Lab Results  Component Value Date   WBC 4.5 10/22/2010   HGB 13.6 10/22/2010   HCT 39.4 10/22/2010   PLT 251.0 10/22/2010   GLUCOSE 97 10/24/2012   CHOL 173 03/23/2012   TRIG 90.0 03/23/2012   HDL 62.90 03/23/2012   LDLDIRECT 138.2 10/22/2010   LDLCALC 92 03/23/2012   ALT 19 03/23/2012   AST 21 03/23/2012   NA 139 10/24/2012   K 4.3 10/24/2012   CL 105 10/24/2012   CREATININE 0.8 10/24/2012   BUN 14 10/24/2012   CO2 26 10/24/2012   TSH 1.51 03/23/2012   HGBA1C 6.2 03/23/2012         Assessment & Plan:

## 2012-10-31 NOTE — Assessment & Plan Note (Signed)
Using podophyllin

## 2012-10-31 NOTE — Patient Instructions (Signed)
Try a milk-free diet x 1 month

## 2012-10-31 NOTE — Assessment & Plan Note (Signed)
Labs

## 2012-10-31 NOTE — Assessment & Plan Note (Signed)
Continue with current prescription therapy as reflected on the Med list. Use qd - improve compliance

## 2012-11-08 ENCOUNTER — Ambulatory Visit: Payer: 59 | Admitting: Internal Medicine

## 2012-11-15 ENCOUNTER — Other Ambulatory Visit: Payer: Self-pay | Admitting: Internal Medicine

## 2012-11-20 ENCOUNTER — Other Ambulatory Visit: Payer: Self-pay | Admitting: Internal Medicine

## 2012-11-22 ENCOUNTER — Telehealth: Payer: Self-pay | Admitting: Internal Medicine

## 2012-11-22 NOTE — Telephone Encounter (Signed)
Patient states that Dr Posey Rea has given her a prescription of omeprazole to take. However, she feels that after taking the medication for a few days, it causes her to have muscle pains/cramps. She states she is not sure if this is related to her cholesterol medicine or the omeprazole. I explained that it is much more likely to be caused by the cholesterol medication as this is a very common side effect from those type of medications. Patient states that she will continue omeprazole instead of Zantac. I advised patient to call back should she decide to take zantac in place of omeprazole and we would be glad to do that.

## 2012-11-30 ENCOUNTER — Encounter: Payer: Self-pay | Admitting: Internal Medicine

## 2012-12-06 ENCOUNTER — Encounter: Payer: Self-pay | Admitting: Internal Medicine

## 2012-12-26 ENCOUNTER — Encounter: Payer: Self-pay | Admitting: Internal Medicine

## 2012-12-26 ENCOUNTER — Ambulatory Visit (INDEPENDENT_AMBULATORY_CARE_PROVIDER_SITE_OTHER): Payer: 59 | Admitting: Internal Medicine

## 2012-12-26 VITALS — BP 138/76 | HR 73 | Temp 98.6°F

## 2012-12-26 DIAGNOSIS — B9789 Other viral agents as the cause of diseases classified elsewhere: Secondary | ICD-10-CM

## 2012-12-26 DIAGNOSIS — R197 Diarrhea, unspecified: Secondary | ICD-10-CM

## 2012-12-26 DIAGNOSIS — B349 Viral infection, unspecified: Secondary | ICD-10-CM

## 2012-12-26 NOTE — Patient Instructions (Signed)
Diarrhea Infections caused by germs (bacterial) or a virus commonly cause diarrhea. Your caregiver has determined that with time, rest and fluids, the diarrhea should improve. In general, eat normally while drinking more water than usual. Although water may prevent dehydration, it does not contain salt and minerals (electrolytes). Broths, weak tea without caffeine and oral rehydration solutions (ORS) replace fluids and electrolytes. Small amounts of fluids should be taken frequently. Large amounts at one time may not be tolerated. Plain water may be harmful in infants and the elderly. Oral rehydrating solutions (ORS) are available at pharmacies and grocery stores. ORS replace water and important electrolytes in proper proportions. Sports drinks are not as effective as ORS and may be harmful due to sugars worsening diarrhea.  ORS is especially recommended for use in children with diarrhea. As a general guideline for children, replace any new fluid losses from diarrhea and/or vomiting with ORS as follows:  If your child weighs 22 pounds or under (10 kg or less), give 60-120 mL ( -  cup or 2 - 4 ounces) of ORS for each episode of diarrheal stool or vomiting episode.  If your child weighs more than 22 pounds (more than 10 kgs), give 120-240 mL ( - 1 cup or 4 - 8 ounces) of ORS for each diarrheal stool or episode of vomiting.  While correcting for dehydration, children should eat normally. However, foods high in sugar should be avoided because this may worsen diarrhea. Large amounts of carbonated soft drinks, juice, gelatin desserts and other highly sugared drinks should be avoided.  After correction of dehydration, other liquids that are appealing to the child may be added. Children should drink small amounts of fluids frequently and fluids should be increased as tolerated. Children should drink enough fluids to keep urine clear or pale yellow.  Adults should eat normally while drinking more fluids than  usual. Drink small amounts of fluids frequently and increase as tolerated. Drink enough fluids to keep urine clear or pale yellow. Broths, weak decaffeinated tea, lemon lime soft drinks (allowed to go flat) and ORS replace fluids and electrolytes.  Avoid:  Carbonated drinks.  Juice.  Extremely hot or cold fluids.  Caffeine drinks.  Fatty, greasy foods.  Alcohol.  Tobacco.  Too much intake of anything at one time.  Gelatin desserts.  Probiotics are active cultures of beneficial bacteria. They may lessen the amount and number of diarrheal stools in adults. Probiotics can be found in yogurt with active cultures and in supplements.  Wash hands well to avoid spreading bacteria and virus.  Anti-diarrheal medications are not recommended for infants and children.  Only take over-the-counter or prescription medicines for pain, discomfort or fever as directed by your caregiver. Do not give aspirin to children because it may cause Reye's Syndrome.  For adults, ask your caregiver if you should continue all prescribed and over-the-counter medicines.  If your caregiver has given you a follow-up appointment, it is very important to keep that appointment. Not keeping the appointment could result in a chronic or permanent injury, and disability. If there is any problem keeping the appointment, you must call back to this facility for assistance. SEEK IMMEDIATE MEDICAL CARE IF:   You or your child is unable to keep fluids down or other symptoms or problems become worse in spite of treatment.  Vomiting or diarrhea develops and becomes persistent.  There is vomiting of blood or bile (green material).  There is blood in the stool or the stools are black and   tarry.  There is no urine output in 6-8 hours or there is only a small amount of very dark urine.  Abdominal pain develops, increases or localizes.  You have a fever.  Your baby is older than 3 months with a rectal temperature of 102 F  (38.9 C) or higher.  Your baby is 3 months old or younger with a rectal temperature of 100.4 F (38 C) or higher.  You or your child develops excessive weakness, dizziness, fainting or extreme thirst.  You or your child develops a rash, stiff neck, severe headache or become irritable or sleepy and difficult to awaken. MAKE SURE YOU:   Understand these instructions.  Will watch your condition.  Will get help right away if you are not doing well or get worse. Document Released: 08/05/2002 Document Revised: 11/07/2011 Document Reviewed: 06/22/2009 [employer] Patient Information 2013 [employer].  

## 2012-12-26 NOTE — Progress Notes (Signed)
Subjective:    Patient ID: Sharon Paul, female    DOB: 24-May-1946, 67 y.o.   MRN: [id number]  HPI  Pt presents to the clinic today with c/o nausea, vomiting x 2 and diarrhea. This started yesterday and got progressively worse over the course of the day. She has been around people with similar symptoms at her work. She has had multiple loose stools. She did take pepto bismol. The concerning thing this morning is that she woke up and her BM this morning was dark almost black. She has never had black stool in the past. She denies abdominal pain, fever, chills or blood in her stool. She did have a colonoscopy in 2009, with some polyps removed. She is having a repeat screeing in July. Her symptoms do seem to be resolving.  Review of Systems      Past Medical History  Diagnosis Date  . Hypertension   . CVD (cardiovascular disease)   . Vitamin D deficiency   . Allergic rhinitis   . GERD (gastroesophageal reflux disease)   . IBS (irritable bowel syndrome)   . 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 Hyperlipidemia   . Anxiety   . Celiac sprue 2007  . Hx of colonic polyps   . Diverticulosis of colon   . Foot fracture, left 2011  . Fibroid   . Osteopenia   . Urinary incontinence     Current Outpatient Prescriptions  Medication Sig Dispense Refill  . aspirin 81 MG EC tablet Take 81 mg by mouth daily.        . AVAPRO 300 MG tablet Take 0.5 tablets (150 mg total) by mouth 2 (two) times daily.  90 tablet  2  . clidinium-chlordiazePOXIDE (LIBRAX) 2.5-5 MG per capsule Take 1 capsule by mouth 3 (three) times daily as needed.  60 capsule  3  . CRESTOR 5 MG tablet Take 1 tablet (5 mg total) by mouth daily.  90 tablet  2  . estradiol (CLIMARA - DOSED IN MG/24 HR) 0.025 mg/24hr Place 1 patch (0.025 mg total) onto the skin once a week.  13 patch  4  . estradiol (ESTRACE VAGINAL) 0.1 MG/GM vaginal cream Apply 1 g at bedtime in her vagina 3 times a  week  42.5 g  12  . FLUoxetine (PROZAC) 10 MG capsule Take 1 capsule (10 mg total) by mouth daily.  90 capsule  3  . hydrochlorothiazide (HYDRODIURIL) 25 MG tablet Take 1 tablet (25 mg total) by mouth daily.  90 tablet  3  . ibuprofen (ADVIL,MOTRIN) 600 MG tablet TAKE 1 TABLET (600 MG TOTAL) BY MOUTH EVERY 8 (EIGHT) HOURS AS NEEDED FOR PAIN.  90 tablet  0  . lipase/protease/amylase (CREON) 12000 UNITS CPEP Take 1 capsule by mouth 3 (three) times daily.  270 capsule  3  . loratadine (CLARITIN) 10 MG tablet Take 10 mg by mouth daily.        [name] LORazepam (ATIVAN) 0.5 MG tablet Take 1 tablet (0.5 mg total) by mouth 2 (two) times daily.  60 tablet  2  . omeprazole (PRILOSEC) 40 MG capsule Take 1 capsule (40 mg total) by mouth daily.  90 capsule  3  . podofilox (CONDYLOX) 0.5 % gel Apply topically 2 (two) times daily. As directed  3.5 g  0  . Probiotic Product (ALIGN) 4 MG CAPS Take 1 capsule by mouth daily.  30 capsule  6  . valACYclovir (VALTREX) 500 MG tablet  Take twice daily for 3-5 days  30 tablet  12  . Vitamin D, Ergocalciferol, (DRISDOL) 50000 UNITS CAPS       . ranitidine (ZANTAC) 150 MG tablet Take 1 tablet (150 mg total) by mouth 2 (two) times daily.  60 tablet  10   No current facility-administered medications for this visit.    Allergies  Allergen Reactions  . Clarithromycin     REACTION: gi upset  . Codeine   . Esomeprazole Magnesium     REACTION: side pain  . Hydrochlorothiazide     REACTION: leg cramps  . Iohexol      Desc: PT HAS SWELLING TO FACE,LIPS AND THROAT WITH CONTRAST DYE   . Kapidex (Dexlansoprazole)     rash  . Losartan Potassium     REACTION: HA  . Olmesartan Medoxomil     REACTION: weak legs  . Ramipril   . Risedronate Sodium     Family History  Problem Relation Age of Onset  . Glaucoma Mother   . Heart disease Mother   . Hypertension Mother   . Heart disease Father   . Hypertension Father   . Colon cancer Neg Hx   . Hypertension Paternal  Grandmother   . Lung cancer Paternal Grandmother   . Hypertension Sister   . Hypertension Brother     History   Social History  . Marital Status: Married    Spouse Name: Sylvan Cheese    Number of Children: N/A  . Years of Education: N/A   Occupational History  .     Social History Main Topics  . Smoking status: Former Games developer  . Smokeless tobacco: Never Used  . Alcohol Use: No     Comment: very rare  . Drug Use: No  . Sexually Active: Yes    Birth Control/ Protection: Surgical   Other Topics Concern  . Not on file   Social History Narrative  . No narrative on file     Constitutional: Pt reports headache. Denies fever, malaise, fatigue, or abrupt weight changes.    Cardiovascular: Denies chest pain, chest tightness, palpitations or swelling in the hands or feet.  Gastrointestinal: Pt reports nausea, vomiting, and diarrhea. Denies abdominal pain, bloating, constipation, or blood in the stool.    No other specific complaints in a complete review of systems (except as listed in HPI above).   Objective:   Physical Exam   BP 138/76  Pulse 73  Temp(Src) 98.6 F (37 C) (Oral)  SpO2 97% Wt Readings from Last 3 Encounters:  10/31/12 134 lb (60.782 kg)  08/13/12 136 lb (61.689 kg)  07/04/12 133 lb (60.328 kg)    General: Appears her stated age, well developed, well nourished in NAD. Cardiovascular: Normal rate and rhythm. S1,S2 noted.  No murmur, rubs or gallops noted. No JVD or BLE edema. No carotid bruits noted. Pulmonary/Chest: Normal effort and positive vesicular breath sounds. No respiratory distress. No wheezes, rales or ronchi noted.  Abdomen: Soft and nontender. Normal bowel sounds, no bruits noted. No distention or masses noted. Liver, spleen and kidneys non palpable.     Assessment & Plan:   Diarrhea, likely viral, new onset:  Reassurance given that change in color  Is secondary to pepto bismol Continue to drink plenty of fluids and get some  rest Wash hands thoroughly and clean bathrooms well Talked with the patient about not using antidiarrheals  RTC as needed or if symptoms

## 2013-01-23 ENCOUNTER — Encounter: Payer: Self-pay | Admitting: Gynecology

## 2013-01-23 ENCOUNTER — Ambulatory Visit (INDEPENDENT_AMBULATORY_CARE_PROVIDER_SITE_OTHER): Payer: 59 | Admitting: Gynecology

## 2013-01-23 VITALS — BP 124/80 | Ht <= 58 in | Wt 130.0 lb

## 2013-01-23 DIAGNOSIS — M8580 Other specified disorders of bone density and structure, unspecified site: Secondary | ICD-10-CM

## 2013-01-23 DIAGNOSIS — N393 Stress incontinence (female) (male): Secondary | ICD-10-CM

## 2013-01-23 DIAGNOSIS — N898 Other specified noninflammatory disorders of vagina: Secondary | ICD-10-CM

## 2013-01-23 DIAGNOSIS — Z01419 Encounter for gynecological examination (general) (routine) without abnormal findings: Secondary | ICD-10-CM

## 2013-01-23 DIAGNOSIS — F411 Generalized anxiety disorder: Secondary | ICD-10-CM

## 2013-01-23 DIAGNOSIS — Z79890 Hormone replacement therapy (postmenopausal): Secondary | ICD-10-CM

## 2013-01-23 DIAGNOSIS — M949 Disorder of cartilage, unspecified: Secondary | ICD-10-CM

## 2013-01-23 DIAGNOSIS — N9489 Other specified conditions associated with female genital organs and menstrual cycle: Secondary | ICD-10-CM

## 2013-01-23 MED ORDER — LORAZEPAM 0.5 MG PO TABS: 0.5000 mg | ORAL_TABLET | Freq: Two times a day (BID) | ORAL | Status: DC

## 2013-01-23 MED ORDER — ESTRADIOL 0.025 MG/24HR TD PTWK: 1.0000 | MEDICATED_PATCH | TRANSDERMAL | Status: DC

## 2013-01-23 NOTE — Patient Instructions (Signed)
Follow up in one year for annual exam 

## 2013-01-23 NOTE — Progress Notes (Signed)
Sharon Paul 12/14/1945 [id number]        67 y.o.  [id number] for annual exam.  Several issues noted below. Former patient of Dr. Eda Paul  Past medical history,surgical history, medications, allergies, family history and social history were all reviewed and documented in the EPIC chart. ROS:  Was performed and pertinent positives and negatives are included in the history.  Exam: Kim assistant Filed Vitals:   01/23/13 1544  BP: 124/80  Height: 4\' 10"  (1.473 m)  Weight: 130 lb (58.968 kg)   General appearance  Normal Skin grossly normal Head/Neck normal with no cervical or supraclavicular adenopathy thyroid normal Lungs  clear Cardiac RR, without RMG Abdominal  soft, nontender, without masses, organomegaly or hernia Breasts  examined lying and sitting without masses, retractions, discharge or axillary adenopathy. Pelvic  Ext/BUS/vagina  normal with atrophic changes  Adnexa  Without masses or tenderness    Anus and perineum  normal   Rectovaginal  normal sphincter tone without palpated masses or tenderness.    Assessment/Plan:  67 y.o. [name] female for annual exam.   1. HRT. Patient is on Climara 0.025 patches.  Has tried stopping these but has unacceptable flushes and night sweats. I reviewed the issue of HRT to include the WHI study with increased risk of stroke heart attack DVT and breast cancer. The ACOG and NAMS statements for lowest dose for shortest period of time reviewed. After lengthy discussion the patient wants to continue it I refilled her times a year. 2. Atrophic vaginitis. Patient uses OTC moisturizers with good results. Will continue to do so. 3. Osteopenia. DEXA 01/2011 showed T score -1.6 FRAX 14%/1.2%. Does take vitamin D 50,000 units every other week. We'll check vitamin D level today. Increase calcium also reviewed. DEXA has been stable from her prior 2009 and will repeat in another year or 2. 4. Mammography 12/2011. Patient knows she is due now and agrees  to call and schedule. SBE monthly reviewed. 5. Pap smear 2012. No Pap smear done today. History of vaginal hysterectomy for leiomyoma. No history of abnormal Pap smears previously. Reviewed current screening guidelines and recommend stop screening as she is over the age of 24 and status post hysterectomy for benign indication. Patient agrees with this. 6. Colonoscopy scheduled July 2014. 7. Health maintenance. Sees Dr. Netty Starring routinely. No blood work done other than her vitamin D level as it is all done through his office.    Dara Lords MD, 4:38 PM 01/23/2013

## 2013-01-24 ENCOUNTER — Encounter: Payer: Self-pay | Admitting: Gynecology

## 2013-01-24 ENCOUNTER — Telehealth: Payer: Self-pay | Admitting: *Deleted

## 2013-01-24 LAB — URINALYSIS W MICROSCOPIC + REFLEX CULTURE
Bacteria, UA: NONE SEEN
Bilirubin Urine: NEGATIVE
Glucose, UA: NEGATIVE mg/dL
Hgb urine dipstick: NEGATIVE
Leukocytes, UA: NEGATIVE
Protein, ur: NEGATIVE mg/dL

## 2013-01-24 LAB — VITAMIN D 25 HYDROXY (VIT D DEFICIENCY, FRACTURES): Vit D, 25-Hydroxy: 30 ng/mL (ref 30–89)

## 2013-01-24 NOTE — Telephone Encounter (Signed)
Pt called requesting samples hyalo gyn gel for vaginal dryness, sample left up front.

## 2013-01-29 ENCOUNTER — Other Ambulatory Visit (INDEPENDENT_AMBULATORY_CARE_PROVIDER_SITE_OTHER): Payer: 59

## 2013-01-29 ENCOUNTER — Other Ambulatory Visit: Payer: Self-pay | Admitting: *Deleted

## 2013-01-29 DIAGNOSIS — K219 Gastro-esophageal reflux disease without esophagitis: Secondary | ICD-10-CM

## 2013-01-29 DIAGNOSIS — R7309 Other abnormal glucose: Secondary | ICD-10-CM

## 2013-01-29 DIAGNOSIS — R739 Hyperglycemia, unspecified: Secondary | ICD-10-CM

## 2013-01-29 DIAGNOSIS — I10 Essential (primary) hypertension: Secondary | ICD-10-CM

## 2013-01-29 LAB — BASIC METABOLIC PANEL
BUN: 13 mg/dL (ref 6–23)
CO2: 30 mEq/L (ref 19–32)
Calcium: 9.2 mg/dL (ref 8.4–10.5)
GFR: 90.4 mL/min (ref >60.00)
Glucose, Bld: 98 mg/dL (ref 70–99)
Potassium: 4.5 mEq/L (ref 3.5–5.1)

## 2013-01-29 LAB — HEPATIC FUNCTION PANEL
ALT: 19 U/L (ref 0–35)
Bilirubin, Direct: 0 mg/dL (ref 0.0–0.3)
Total Bilirubin: 0.6 mg/dL (ref 0.3–1.2)

## 2013-01-29 LAB — LIPID PANEL
Total CHOL/HDL Ratio: 4
VLDL: 27.6 mg/dL (ref 0.0–40.0)

## 2013-01-30 ENCOUNTER — Other Ambulatory Visit: Payer: Self-pay | Admitting: *Deleted

## 2013-01-30 DIAGNOSIS — R739 Hyperglycemia, unspecified: Secondary | ICD-10-CM

## 2013-01-30 LAB — HEMOGLOBIN A1C: Hgb A1c MFr Bld: 6 % (ref 4.6–6.5)

## 2013-02-05 ENCOUNTER — Ambulatory Visit (INDEPENDENT_AMBULATORY_CARE_PROVIDER_SITE_OTHER): Payer: 59 | Admitting: Internal Medicine

## 2013-02-05 ENCOUNTER — Encounter: Payer: Self-pay | Admitting: Internal Medicine

## 2013-02-05 VITALS — BP 120/80 | HR 68 | Temp 97.9°F | Resp 16 | Wt 131.0 lb

## 2013-02-05 DIAGNOSIS — I10 Essential (primary) hypertension: Secondary | ICD-10-CM

## 2013-02-05 DIAGNOSIS — R7309 Other abnormal glucose: Secondary | ICD-10-CM

## 2013-02-05 DIAGNOSIS — E785 Hyperlipidemia, unspecified: Secondary | ICD-10-CM

## 2013-02-05 DIAGNOSIS — R739 Hyperglycemia, unspecified: Secondary | ICD-10-CM

## 2013-02-05 DIAGNOSIS — M2550 Pain in unspecified joint: Secondary | ICD-10-CM

## 2013-02-05 DIAGNOSIS — F411 Generalized anxiety disorder: Secondary | ICD-10-CM

## 2013-02-05 DIAGNOSIS — E559 Vitamin D deficiency, unspecified: Secondary | ICD-10-CM

## 2013-02-05 NOTE — Assessment & Plan Note (Signed)
Continue with current prescription therapy as reflected on the Med list.  

## 2013-02-05 NOTE — Progress Notes (Signed)
   Subjective:    HPI  The patient presents for a follow-up of  chronic hypertension, chronic dyslipidemia, anxiety with medicines   She had a LBP w/a R sciatica - she saw Dr Leslee Home, had a PT - doing well now. Exercising, lost weight.  BP is ok at home; stress is better  BP Readings from Last 3 Encounters:  02/05/13 170/90  01/23/13 124/80  12/26/12 138/76   Wt Readings from Last 3 Encounters:  02/05/13 131 lb (59.421 kg)  01/23/13 130 lb (58.968 kg)  10/31/12 134 lb (60.782 kg)     Review of Systems  Constitutional: Negative for chills, activity change, appetite change, fatigue and unexpected weight change.  HENT: Negative for congestion, mouth sores and sinus pressure.   Eyes: Negative for visual disturbance.  Respiratory: Negative for cough and chest tightness.   Gastrointestinal: Negative for nausea and abdominal pain.  Genitourinary: Negative for urgency, frequency, difficulty urinating and vaginal pain.  Musculoskeletal: Negative for back pain and gait problem.  Skin: Negative for pallor and rash.  Neurological: Negative for dizziness, tremors, weakness and headaches.  Psychiatric/Behavioral: Negative for suicidal ideas, confusion and sleep disturbance. The patient is nervous/anxious.        Objective:   Physical Exam  Constitutional: She appears well-developed and well-nourished. No distress.  HENT:  Head: Normocephalic.  Right Ear: External ear normal.  Left Ear: External ear normal.  Nose: Nose normal.  Mouth/Throat: Oropharynx is clear and moist.  Eyes: Conjunctivae are normal. Pupils are equal, round, and reactive to light. Right eye exhibits no discharge. Left eye exhibits no discharge.  Neck: Normal range of motion. Neck supple. No JVD present. No tracheal deviation present. No thyromegaly present.  Cardiovascular: Normal rate, regular rhythm and normal heart sounds.   Pulmonary/Chest: No stridor. No respiratory distress. She has no wheezes.   Abdominal: Soft. Bowel sounds are normal. She exhibits no distension and no mass. There is no tenderness. There is no rebound and no guarding.  Musculoskeletal: She exhibits no edema and no tenderness.  Lymphadenopathy:    She has no cervical adenopathy.  Neurological: She displays normal reflexes. No cranial nerve deficit. She exhibits normal muscle tone. Coordination normal.  Skin: No rash noted. No erythema.  Psychiatric: She has a normal mood and affect. Her behavior is normal. Judgment and thought content normal.    Lab Results  Component Value Date   WBC 4.5 10/22/2010   HGB 13.6 10/22/2010   HCT 39.4 10/22/2010   PLT 251.0 10/22/2010   GLUCOSE 98 01/29/2013   CHOL 199 01/29/2013   TRIG 138.0 01/29/2013   HDL 47.30 01/29/2013   LDLDIRECT 138.2 10/22/2010   LDLCALC 124* 01/29/2013   ALT 19 01/29/2013   AST 17 01/29/2013   NA 140 01/29/2013   K 4.5 01/29/2013   CL 106 01/29/2013   CREATININE 0.7 01/29/2013   BUN 13 01/29/2013   CO2 30 01/29/2013   TSH 1.68 01/29/2013   HGBA1C 6.0 01/30/2013         Assessment & Plan:

## 2013-02-05 NOTE — Assessment & Plan Note (Signed)
Crestor qod 

## 2013-02-11 ENCOUNTER — Ambulatory Visit (AMBULATORY_SURGERY_CENTER): Payer: 59 | Admitting: *Deleted

## 2013-02-11 VITALS — Ht 59.0 in | Wt 132.0 lb

## 2013-02-11 DIAGNOSIS — Z1211 Encounter for screening for malignant neoplasm of colon: Secondary | ICD-10-CM

## 2013-02-11 MED ORDER — MOVIPREP 100 G PO SOLR: ORAL | Status: DC

## 2013-02-12 ENCOUNTER — Encounter: Payer: Self-pay | Admitting: Internal Medicine

## 2013-02-13 ENCOUNTER — Other Ambulatory Visit: Payer: Self-pay | Admitting: Internal Medicine

## 2013-02-26 ENCOUNTER — Encounter: Payer: Self-pay | Admitting: Gynecology

## 2013-03-05 ENCOUNTER — Telehealth: Payer: Self-pay | Admitting: *Deleted

## 2013-03-05 NOTE — Telephone Encounter (Signed)
Pt had mammogram done on 02/26/13 radiologist recommended a 3-D mammogram. Her letter told her to consult with physician if this would be needed. I explained pt that the MD's typically tell pt to do whatever the radiologist recommend. Please advise

## 2013-03-05 NOTE — Telephone Encounter (Signed)
Pt informed with the below note. 

## 2013-03-05 NOTE — Telephone Encounter (Signed)
The mammogram report stated that they recommended a 3-D mammography when she schedules her next year mammogram. Does not recommend scheduling now. I think that's a good idea for next year.

## 2013-03-06 ENCOUNTER — Encounter: Payer: Self-pay | Admitting: Internal Medicine

## 2013-03-06 ENCOUNTER — Ambulatory Visit (AMBULATORY_SURGERY_CENTER): Payer: 59 | Admitting: Internal Medicine

## 2013-03-06 VITALS — BP 135/71 | HR 66 | Temp 97.1°F | Resp 17 | Ht 59.0 in | Wt 132.0 lb

## 2013-03-06 DIAGNOSIS — Z86010 Personal history of colonic polyps: Secondary | ICD-10-CM

## 2013-03-06 MED ORDER — SODIUM CHLORIDE 0.9 % IV SOLN: 500.0000 mL | INTRAVENOUS | Status: DC

## 2013-03-06 NOTE — Progress Notes (Signed)
A/ox3 pleased with MAC report to [facility]

## 2013-03-06 NOTE — Patient Instructions (Addendum)
YOU HAD AN ENDOSCOPIC PROCEDURE TODAY AT THE [facility]: Refer to the procedure report that was given to you for any specific questions about what was found during the examination.  If the procedure report does not answer your questions, please call your gastroenterologist to clarify.  If you requested that your care partner not be given the details of your procedure findings, then the procedure report has been included in a sealed envelope for you to review at your convenience later.  YOU SHOULD EXPECT: Some feelings of bloating in the abdomen. Passage of more gas than usual.  Walking can help get rid of the air that was put into your GI tract during the procedure and reduce the bloating. If you had a lower endoscopy (such as a colonoscopy or flexible sigmoidoscopy) you may notice spotting of blood in your stool or on the toilet paper. If you underwent a bowel prep for your procedure, then you may not have a normal bowel movement for a few days.  DIET: Your first meal following the procedure should be a light meal and then it is ok to progress to your normal diet.  A half-sandwich or bowl of soup is an example of a good first meal.  Heavy or fried foods are harder to digest and may make you feel nauseous or bloated.  Likewise meals heavy in dairy and vegetables can cause extra gas to form and this can also increase the bloating.  Drink plenty of fluids but you should avoid alcoholic beverages for 24 hours.  ACTIVITY: Your care partner should take you home directly after the procedure.  You should plan to take it easy, moving slowly for the rest of the day.  You can resume normal activity the day after the procedure however you should NOT DRIVE or use heavy machinery for 24 hours (because of the sedation medicines used during the test).    SYMPTOMS TO REPORT IMMEDIATELY: A gastroenterologist can be reached at any hour.  During normal business hours, 8:30 AM to 5:00 PM Monday through Friday,  call [phone].  After hours and on weekends, please call the GI answering service at [phone] emergency number  who will take a message and have the physician on call contact you.   Following lower endoscopy (colonoscopy or flexible sigmoidoscopy):  Excessive amounts of blood in the stool  Significant tenderness or worsening of abdominal pains  Swelling of the abdomen that is new, acute  Fever of 100F or higher  FOLLOW UP: If any biopsies were taken you will be contacted by phone or by letter within the next 1-3 weeks.  Call your gastroenterologist if you have not heard about the biopsies in 3 weeks.  Our staff will call the home number listed on your records the next business day following your procedure to check on you and address any questions or concerns that you may have at that time regarding the information given to you following your procedure. This is a courtesy call and so if there is no answer at the home number and we have not heard from you through the emergency physician on call, we will assume that you have returned to your regular daily activities without incident.  SIGNATURES/CONFIDENTIALITY: You and/or your care partner have signed paperwork which will be entered into your electronic medical record.  These signatures attest to the fact that that the information above on your After Visit Summary has been reviewed and is understood.  Full responsibility of the   confidentiality of this discharge information lies with you and/or your care-partner.   

## 2013-03-06 NOTE — Progress Notes (Signed)
Patient did not experience any of the following events: a burn prior to discharge; a fall within the facility; wrong site/side/patient/procedure/implant event; or a hospital transfer or hospital admission upon discharge from the facility. (G8907)Patient did not have preoperative order for IV antibiotic SSI prophylaxis. (G8918) ewm 

## 2013-03-06 NOTE — Op Note (Signed)
 [facility] [address] [place], [address]   COLONOSCOPY PROCEDURE REPORT  PATIENT: Sharon, Paul  MR#: #[id number] BIRTHDATE: 10-01-1945 , 67  yrs. old GENDER: Female ENDOSCOPIST: Hart Carwin, MD REFERRED BY:  recall colonoscopy PROCEDURE DATE:  03/06/2013 PROCEDURE:   Colonoscopy, surveillance ASA CLASS:   Class II INDICATIONS:Patient's personal history of adenomatous colon polyps and adenomatous polyp removed 2008, no polyps 2002. MEDICATIONS: MAC sedation, administered by CRNA and Propofol (Diprivan) 160 mg IV  DESCRIPTION OF PROCEDURE:   After the risks and benefits and of the procedure were explained, informed consent was obtained.  A digital rectal exam revealed no abnormalities of the rectum.    The LB [id number]  endoscope was introduced through the anus and advanced to the cecum, which was identified by both the appendix and ileocecal valve .  The quality of the prep was excellent, using MoviPrep .  The instrument was then slowly withdrawn as the colon was fully examined.     COLON FINDINGS: A normal appearing cecum, ileocecal valve, and appendiceal orifice were identified.  The ascending, hepatic flexure, transverse, splenic flexure, descending, sigmoid colon and rectum appeared unremarkable.  No polyps or cancers were seen. Retroflexed views revealed no abnormalities.     The scope was then withdrawn from the patient and the procedure completed.  COMPLICATIONS: There were no complications. ENDOSCOPIC IMPRESSION: Normal colon  RECOMMENDATIONS: High fiber diet   REPEAT EXAM: In 10 year(s)  for Colonoscopy.  WN:UUVO V.  Plotnikov, MD  _______________________________ eSigned:  Hart Carwin, MD 03/06/2013 8:31 AM

## 2013-03-07 ENCOUNTER — Telehealth: Payer: Self-pay | Admitting: *Deleted

## 2013-03-07 NOTE — Telephone Encounter (Signed)
  Follow up Call-  Call back number 03/06/2013  Post procedure Call Back phone  # [phone]  Permission to leave phone message Yes     Patient questions:  Do you have a fever, pain , or abdominal swelling? no Pain Score  0 *  Have you tolerated food without any problems? yes  Have you been able to return to your normal activities? yes  Do you have any questions about your discharge instructions: Diet   no Medications  no Follow up visit  no  Do you have questions or concerns about your Care? no  Actions: * If pain score is 4 or above: No action needed, pain <4.

## 2013-03-26 ENCOUNTER — Other Ambulatory Visit: Payer: Self-pay | Admitting: Internal Medicine

## 2013-04-04 ENCOUNTER — Telehealth: Payer: Self-pay

## 2013-04-04 NOTE — Telephone Encounter (Signed)
Patient called [facility] stating that her mail order pharmacy advised that crestor was denied. Returned call to patient//[facility] advising that it was approved for 90 days with refills and receipt confirmed by pharmacy.

## 2013-04-05 ENCOUNTER — Other Ambulatory Visit: Payer: Self-pay | Admitting: *Deleted

## 2013-04-05 MED ORDER — CRESTOR 5 MG PO TABS: 5.0000 mg | ORAL_TABLET | Freq: Every day | ORAL | Status: DC

## 2013-04-16 ENCOUNTER — Encounter: Payer: Self-pay | Admitting: Internal Medicine

## 2013-04-16 ENCOUNTER — Ambulatory Visit (INDEPENDENT_AMBULATORY_CARE_PROVIDER_SITE_OTHER): Payer: 59 | Admitting: Internal Medicine

## 2013-04-16 VITALS — BP 150/90 | HR 72 | Temp 98.3°F | Resp 16 | Wt 129.0 lb

## 2013-04-16 DIAGNOSIS — M2550 Pain in unspecified joint: Secondary | ICD-10-CM

## 2013-04-16 DIAGNOSIS — I10 Essential (primary) hypertension: Secondary | ICD-10-CM

## 2013-04-16 DIAGNOSIS — E559 Vitamin D deficiency, unspecified: Secondary | ICD-10-CM

## 2013-04-16 DIAGNOSIS — E785 Hyperlipidemia, unspecified: Secondary | ICD-10-CM

## 2013-04-16 MED ORDER — CRESTOR 5 MG PO TABS: 5.0000 mg | ORAL_TABLET | Freq: Every day | ORAL | Status: DC

## 2013-04-16 MED ORDER — CEFUROXIME AXETIL 250 MG PO TABS: 250.0000 mg | ORAL_TABLET | Freq: Two times a day (BID) | ORAL | Status: DC

## 2013-04-16 MED ORDER — AMLODIPINE BESYLATE 2.5 MG PO TABS: 2.5000 mg | ORAL_TABLET | Freq: Every day | ORAL | Status: DC

## 2013-04-16 NOTE — Patient Instructions (Addendum)
Gluten free trial (no wheat products) for 4-6 weeks. OK to use gluten-free bread and gluten-free pasta.  Milk free trial (no milk, ice cream, cheese and yogurt) for 4-6 weeks. OK to use almond, coconut, rice or soy milk.    Use over-the-counter  "cold" medicines  such as "Afrin" nasal spray for nasal congestion as directed instead. Use" Delsym" or" Robitussin" cough syrup varietis for cough.  You can use plain "Tylenol" or "Advil" for fever, chills and achyness.   Please, make an appointment if you are not better or if you're worse.

## 2013-04-16 NOTE — Progress Notes (Signed)
   Subjective:    Sinusitis This is a new problem. The current episode started in the past 7 days. The pain is moderate. Associated symptoms include chills and headaches. Pertinent negatives include no congestion, coughing, diaphoresis or sinus pressure. The treatment provided moderate relief.     The patient presents for a follow-up of  chronic hypertension, chronic dyslipidemia, anxiety with medicines    BP is ok at home; stress is better  BP Readings from Last 3 Encounters:  04/16/13 190/96  03/06/13 135/71  02/05/13 120/80   Wt Readings from Last 3 Encounters:  04/16/13 129 lb (58.514 kg)  03/06/13 132 lb (59.875 kg)  02/11/13 132 lb (59.875 kg)     Review of Systems  Constitutional: Positive for chills. Negative for diaphoresis, activity change, appetite change, fatigue and unexpected weight change.  HENT: Negative for congestion, mouth sores and sinus pressure.   Eyes: Negative for visual disturbance.  Respiratory: Negative for cough and chest tightness.   Gastrointestinal: Negative for nausea and abdominal pain.  Genitourinary: Negative for urgency, frequency, difficulty urinating and vaginal pain.  Musculoskeletal: Negative for back pain and gait problem.  Skin: Negative for pallor and rash.  Neurological: Positive for headaches. Negative for dizziness, tremors and weakness.  Psychiatric/Behavioral: Negative for suicidal ideas, confusion and sleep disturbance. The patient is nervous/anxious.        Objective:   Physical Exam  Constitutional: She appears well-developed and well-nourished. No distress.  HENT:  Head: Normocephalic.  Right Ear: External ear normal.  Left Ear: External ear normal.  Nose: Nose normal.  Mouth/Throat: Oropharynx is clear and moist.  Eyes: Conjunctivae are normal. Pupils are equal, round, and reactive to light. Right eye exhibits no discharge. Left eye exhibits no discharge.  Neck: Normal range of motion. Neck supple. No JVD present.  No tracheal deviation present. No thyromegaly present.  Cardiovascular: Normal rate, regular rhythm and normal heart sounds.   Pulmonary/Chest: No stridor. No respiratory distress. She has no wheezes.  Abdominal: Soft. Bowel sounds are normal. She exhibits no distension and no mass. There is no tenderness. There is no rebound and no guarding.  Musculoskeletal: She exhibits no edema and no tenderness.  Lymphadenopathy:    She has no cervical adenopathy.  Neurological: She displays normal reflexes. No cranial nerve deficit. She exhibits normal muscle tone. Coordination normal.  Skin: No rash noted. No erythema.  Psychiatric: She has a normal mood and affect. Her behavior is normal. Judgment and thought content normal.    Lab Results  Component Value Date   WBC 4.5 10/22/2010   HGB 13.6 10/22/2010   HCT 39.4 10/22/2010   PLT 251.0 10/22/2010   GLUCOSE 98 01/29/2013   CHOL 199 01/29/2013   TRIG 138.0 01/29/2013   HDL 47.30 01/29/2013   LDLDIRECT 138.2 10/22/2010   LDLCALC 124* 01/29/2013   ALT 19 01/29/2013   AST 17 01/29/2013   NA 140 01/29/2013   K 4.5 01/29/2013   CL 106 01/29/2013   CREATININE 0.7 01/29/2013   BUN 13 01/29/2013   CO2 30 01/29/2013   TSH 1.68 01/29/2013   HGBA1C 6.0 01/30/2013         Assessment & Plan:

## 2013-04-17 ENCOUNTER — Encounter: Payer: Self-pay | Admitting: Internal Medicine

## 2013-04-17 NOTE — Assessment & Plan Note (Signed)
Will try to add Norvasc at low dose

## 2013-04-17 NOTE — Assessment & Plan Note (Signed)
Continue with current prescription therapy as reflected on the Med list.  

## 2013-05-04 ENCOUNTER — Other Ambulatory Visit: Payer: Self-pay | Admitting: Internal Medicine

## 2013-05-06 ENCOUNTER — Telehealth: Payer: Self-pay | Admitting: *Deleted

## 2013-05-06 NOTE — Telephone Encounter (Signed)
Do I need to do anything to help the situation? Thx

## 2013-05-06 NOTE — Telephone Encounter (Signed)
[facility] Triage Call Report Triage Record Num: [id number] Operator: Craig Guess Patient Name: Sharon Paul Call Date & Time: 05/04/2013 5:59:52PM Patient Phone: [phone] PCP: Sonda Primes Patient Gender: Female PCP Fax : [phone] Patient DOB: [birth year] Practice Name: [facility] Reason for Call: Caller: Doyce/Patient; PCP: Sonda Primes (Adults only); CB#: [phone]; Call regarding medication. Caller reports she ordered her blood pressure medication from [facility] and was to get it by Saturday 9/6 and the wrong medication arrived today. Caller has been without her Avapro since Thurs and needs enough to last until it arrives on Tues 05/07/13. EPIC records reviewed, She has 2 refills of Avapro 300 Mg with directions to take 1/2 q am and 1/2 q pm. RN had difficulty locating 24 hour pharmacy. Pharmacy is [facility] [address] at [phone]. RN spoke with Sherian Maroon, RPh. Caller advised of the same. Protocol(s) Used: Medication Questions - Adult Recommended Outcome per Protocol: Call Dispensing Pharmacy or Provider Immediately Reason for Outcome: Unable to obtain prescribed medication related to available resources AND situation poses immediate clinical risk Care Advice: ~ 09/

## 2013-05-13 ENCOUNTER — Telehealth: Payer: Self-pay | Admitting: *Deleted

## 2013-05-13 NOTE — Telephone Encounter (Signed)
Pt states since starting Amlodipine pt is experiencing headaches.  Pt states she discontinued medication on Friday and headaches have resolve. Pt further states she was once on HCTZ which was effective. Please advise

## 2013-05-13 NOTE — Telephone Encounter (Signed)
HCTZ caused leg cramps acc to our records She can try Amlodipine 1/2 tab a day Thx

## 2013-05-14 NOTE — Telephone Encounter (Signed)
Spoke with pt advised of MDs message 

## 2013-06-11 ENCOUNTER — Ambulatory Visit: Payer: 59 | Admitting: Internal Medicine

## 2013-07-03 ENCOUNTER — Encounter: Payer: Self-pay | Admitting: Internal Medicine

## 2013-07-03 ENCOUNTER — Ambulatory Visit (INDEPENDENT_AMBULATORY_CARE_PROVIDER_SITE_OTHER): Payer: 59 | Admitting: Internal Medicine

## 2013-07-03 VITALS — BP 168/92 | HR 80 | Temp 97.0°F | Resp 16 | Wt 129.0 lb

## 2013-07-03 DIAGNOSIS — F411 Generalized anxiety disorder: Secondary | ICD-10-CM

## 2013-07-03 DIAGNOSIS — I10 Essential (primary) hypertension: Secondary | ICD-10-CM

## 2013-07-03 MED ORDER — METOPROLOL SUCCINATE ER 25 MG PO TB24: 25.0000 mg | ORAL_TABLET | Freq: Every day | ORAL | Status: DC

## 2013-07-03 MED ORDER — ASPIRIN 325 MG PO TABS: 325.0000 mg | ORAL_TABLET | Freq: Every day | ORAL | Status: DC

## 2013-07-03 NOTE — Assessment & Plan Note (Signed)
Father just had a stroke in [place] Discussed

## 2013-07-03 NOTE — Progress Notes (Signed)
Pre-visit discussion using our clinic review tool. No additional management support is needed unless otherwise documented below in the visit note.  

## 2013-07-03 NOTE — Assessment & Plan Note (Signed)
Continue with current prescription therapy as reflected on the Med list.  

## 2013-07-04 NOTE — Progress Notes (Signed)
   Subjective:    Sinusitis This is a new problem. The current episode started in the past 7 days. The pain is moderate. Associated symptoms include chills and headaches. Pertinent negatives include no congestion, coughing, diaphoresis or sinus pressure. The treatment provided moderate relief.     The patient presents for a follow-up of  chronic hypertension, chronic dyslipidemia, anxiety with medicines    BP is ok at home; stress is better  BP Readings from Last 3 Encounters:  07/03/13 168/92  04/16/13 150/90  03/06/13 135/71   Wt Readings from Last 3 Encounters:  07/03/13 129 lb (58.514 kg)  04/16/13 129 lb (58.514 kg)  03/06/13 132 lb (59.875 kg)     Review of Systems  Constitutional: Positive for chills. Negative for diaphoresis, activity change, appetite change, fatigue and unexpected weight change.  HENT: Negative for congestion, mouth sores and sinus pressure.   Eyes: Negative for visual disturbance.  Respiratory: Negative for cough and chest tightness.   Gastrointestinal: Negative for nausea and abdominal pain.  Genitourinary: Negative for urgency, frequency, difficulty urinating and vaginal pain.  Musculoskeletal: Negative for back pain and gait problem.  Skin: Negative for pallor and rash.  Neurological: Positive for headaches. Negative for dizziness, tremors and weakness.  Psychiatric/Behavioral: Negative for suicidal ideas, confusion and sleep disturbance. The patient is nervous/anxious.        Objective:   Physical Exam  Constitutional: She appears well-developed and well-nourished. No distress.  HENT:  Head: Normocephalic.  Right Ear: External ear normal.  Left Ear: External ear normal.  Nose: Nose normal.  Mouth/Throat: Oropharynx is clear and moist.  Eyes: Conjunctivae are normal. Pupils are equal, round, and reactive to light. Right eye exhibits no discharge. Left eye exhibits no discharge.  Neck: Normal range of motion. Neck supple. No JVD present.  No tracheal deviation present. No thyromegaly present.  Cardiovascular: Normal rate, regular rhythm and normal heart sounds.   Pulmonary/Chest: No stridor. No respiratory distress. She has no wheezes.  Abdominal: Soft. Bowel sounds are normal. She exhibits no distension and no mass. There is no tenderness. There is no rebound and no guarding.  Musculoskeletal: She exhibits no edema and no tenderness.  Lymphadenopathy:    She has no cervical adenopathy.  Neurological: She displays normal reflexes. No cranial nerve deficit. She exhibits normal muscle tone. Coordination normal.  Skin: No rash noted. No erythema.  Psychiatric: She has a normal mood and affect. Her behavior is normal. Judgment and thought content normal.    Lab Results  Component Value Date   WBC 4.5 10/22/2010   HGB 13.6 10/22/2010   HCT 39.4 10/22/2010   PLT 251.0 10/22/2010   GLUCOSE 98 01/29/2013   CHOL 199 01/29/2013   TRIG 138.0 01/29/2013   HDL 47.30 01/29/2013   LDLDIRECT 138.2 10/22/2010   LDLCALC 124* 01/29/2013   ALT 19 01/29/2013   AST 17 01/29/2013   NA 140 01/29/2013   K 4.5 01/29/2013   CL 106 01/29/2013   CREATININE 0.7 01/29/2013   BUN 13 01/29/2013   CO2 30 01/29/2013   TSH 1.68 01/29/2013   HGBA1C 6.0 01/30/2013         Assessment & Plan:

## 2013-07-25 ENCOUNTER — Other Ambulatory Visit: Payer: Self-pay | Admitting: Internal Medicine

## 2013-07-29 ENCOUNTER — Other Ambulatory Visit: Payer: Self-pay | Admitting: *Deleted

## 2013-07-29 MED ORDER — AVAPRO 300 MG PO TABS: 300.0000 mg | ORAL_TABLET | Freq: Every day | ORAL | Status: DC

## 2013-07-30 ENCOUNTER — Telehealth: Payer: Self-pay | Admitting: Internal Medicine

## 2013-07-30 NOTE — Telephone Encounter (Signed)
Please follow up with [facility] about the Avapro 300 mg [phone].  Pt stated they left her a VM today stating we have not approved this med refill.  Please advise.  Please call pt with the result.

## 2013-08-01 NOTE — Telephone Encounter (Signed)
$  250 for 90 days. Rx was sent on 07/26/13. Rx will be shipped to pt once payment is received. Left detailed mess informing pt of this.

## 2013-08-12 ENCOUNTER — Other Ambulatory Visit: Payer: Self-pay

## 2013-08-12 MED ORDER — VITAMIN D (ERGOCALCIFEROL) 1.25 MG (50000 UNIT) PO CAPS: 50000.0000 [IU] | ORAL_CAPSULE | ORAL | Status: DC

## 2013-08-13 ENCOUNTER — Telehealth: Payer: Self-pay | Admitting: *Deleted

## 2013-08-13 NOTE — Telephone Encounter (Signed)
Pt called states she has congestion since Saturday.  Denies fever requests something called in.  Please advise

## 2013-08-14 MED ORDER — PROMETHAZINE-CODEINE 6.25-10 MG/5ML PO SYRP: 5.0000 mL | ORAL_SOLUTION | Freq: Four times a day (QID) | ORAL | Status: DC | PRN

## 2013-08-14 MED ORDER — HYDROCOD POLST-CHLORPHEN POLST 10-8 MG/5ML PO LQCR: 5.0000 mL | Freq: Two times a day (BID) | ORAL | Status: DC | PRN

## 2013-08-14 NOTE — Telephone Encounter (Signed)
Per MD prometh-codeine cough syrup phoned in .  Pt informed

## 2013-08-15 ENCOUNTER — Ambulatory Visit (INDEPENDENT_AMBULATORY_CARE_PROVIDER_SITE_OTHER): Payer: 59 | Admitting: Internal Medicine

## 2013-08-15 ENCOUNTER — Encounter: Payer: Self-pay | Admitting: Internal Medicine

## 2013-08-15 VITALS — BP 132/88 | HR 72 | Temp 99.2°F | Wt 128.4 lb

## 2013-08-15 DIAGNOSIS — I10 Essential (primary) hypertension: Secondary | ICD-10-CM

## 2013-08-15 DIAGNOSIS — J019 Acute sinusitis, unspecified: Secondary | ICD-10-CM

## 2013-08-15 MED ORDER — AZITHROMYCIN 250 MG PO TABS: ORAL_TABLET | ORAL | Status: DC

## 2013-08-15 MED ORDER — PREDNISONE (PAK) 10 MG PO TABS: ORAL_TABLET | ORAL | Status: DC

## 2013-08-15 NOTE — Assessment & Plan Note (Signed)
BP Readings from Last 3 Encounters:  08/15/13 132/88  07/03/13 168/92  04/16/13 150/90   Reviewed recent medication changes Improved with addition of Toprol, titrate this up now Continue ARB. Followup with PCP as planned

## 2013-08-15 NOTE — Patient Instructions (Addendum)
It was good to see you today.  Z-Pak for 5 days and prednisone taper over next 6 days -your prescription was printed, signed and given to you to take your pharmacy. Please take as directed and call if problems  Continue prescription cough syrup at night and over-the-counter medications for cough and congestion during the day  Hydrate, rest and use saline irrigation as discussed  Okay to take a whole tablet of Toprol XR for blood pressure in addition to Avapro as ongoing -followup with Dr. Posey Rea as planned  Hypertension Hypertension is another name for high blood pressure. High blood pressure may mean that your heart needs to work harder to pump blood. Blood pressure consists of two numbers, which includes a higher number over a lower number (example: 110/72). HOME CARE   Make lifestyle changes as told by your doctor. This may include weight loss and exercise.  Take your blood pressure medicine every day.  Limit how much salt you use.  Stop smoking if you smoke.  Do not use drugs.  Talk to your doctor if you are using decongestants or birth control pills. These medicines might make blood pressure higher.  Females should not drink more than 1 alcoholic drink per day. Males should not drink more than 2 alcoholic drinks per day.  See your doctor as told. GET HELP RIGHT AWAY IF:   You have a blood pressure reading with a top number of 180 or higher.  You get a very bad headache.  You get blurred or changing vision.  You feel confused.  You feel weak, numb, or faint.  You get chest or belly (abdominal) pain.  You throw up (vomit).  You cannot breathe very well. MAKE SURE YOU:   Understand these instructions.  Will watch your condition.  Will get help right away if you are not doing well or get worse. Document Released: 02/01/2008 Document Revised: 11/07/2011 Document Reviewed: 02/01/2008 [facility] Patient Information 2014 [place], [place].

## 2013-08-15 NOTE — Progress Notes (Signed)
Pre-visit discussion using our clinic review tool. No additional management support is needed unless otherwise documented below in the visit note.  

## 2013-08-15 NOTE — Progress Notes (Signed)
Subjective:    Patient ID: Sharon Paul, female    DOB: 1945/12/25, 67 y.o.   MRN: [id number]  Cough Associated symptoms include ear pain (bilateral), a fever (low grade), postnasal drip, a sore throat and wheezing (esp night). Pertinent negatives include no chest pain or shortness of breath.  Wheezing  Associated symptoms include coughing, ear pain (bilateral), a fever (low grade) and a sore throat. Pertinent negatives include no chest pain, diarrhea or shortness of breath.    complains of cold symptoms Onset 5 days ago +sick contacts and weather change precipitating same Min improved last PM with prometh cod syrup - but feels very tired  Past Medical History  Diagnosis Date  . Hypertension   . CVD (cardiovascular disease)   . Vitamin D deficiency   . Allergic rhinitis   . GERD (gastroesophageal reflux disease)   . IBS (irritable bowel syndrome)   . 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 Hyperlipidemia   . Anxiety   . Celiac sprue 2007  . Hx of colonic polyps   . Diverticulosis of colon   . Foot fracture, left 2011  . Osteopenia 01/2011    T score of -1.6 FRAX 14%/1.2%  . Urinary incontinence     Review of Systems  Constitutional: Positive for fever (low grade) and fatigue.  HENT: Positive for congestion, ear pain (bilateral), postnasal drip, sinus pressure and sore throat. Negative for mouth sores and sneezing.   Respiratory: Positive for cough and wheezing (esp night). Negative for shortness of breath.   Cardiovascular: Negative for chest pain and leg swelling.  Gastrointestinal: Negative for nausea and diarrhea.       Objective:   Physical Exam BP 132/88  Pulse 72  Temp(Src) 99.2 F (37.3 C) (Oral)  Wt 128 lb 6.4 oz (58.242 kg)  SpO2 98% Wt Readings from Last 3 Encounters:  08/15/13 128 lb 6.4 oz (58.242 kg)  07/03/13 129 lb (58.514 kg)  04/16/13 129 lb (58.514 kg)   Constitutional: She appears  well-developed and well-nourished. No distress. congested voice HENT: Head: Normocephalic and atraumatic. Mild sinus tenderness to palpation Ears: B TMs ok, no erythema or effusion; Nose: swollen pale turbinates . Mouth/Throat: Oropharynx is red, but clear and moist. No oropharyngeal exudate.  Eyes: Conjunctivae and EOM are normal. Pupils are equal, round, and reactive to light. No scleral icterus.  Neck: Normal range of motion. Neck supple. No JVD present. No thyromegaly present.  Cardiovascular: Normal rate, regular rhythm and normal heart sounds.  No murmur heard. No BLE edema. Pulmonary/Chest: Effort normal - no crackle or rhonchi but soft end exp wheeze LUL. No respiratory distress.   Psychiatric: She has a normal mood and affect. Her behavior is normal. Judgment and thought content normal.   Lab Results  Component Value Date   WBC 4.5 10/22/2010   HGB 13.6 10/22/2010   HCT 39.4 10/22/2010   PLT 251.0 10/22/2010   GLUCOSE 98 01/29/2013   CHOL 199 01/29/2013   TRIG 138.0 01/29/2013   HDL 47.30 01/29/2013   LDLDIRECT 138.2 10/22/2010   LDLCALC 124* 01/29/2013   ALT 19 01/29/2013   AST 17 01/29/2013   NA 140 01/29/2013   K 4.5 01/29/2013   CL 106 01/29/2013   CREATININE 0.7 01/29/2013   BUN 13 01/29/2013   CO2 30 01/29/2013   TSH 1.68 01/29/2013   HGBA1C 6.0 01/30/2013       Assessment & Plan:    Acute  sinusitis with discolored mucus, fever and postnasal drip Symptoms ongoing greater than one week  tx with empiric Z-Pak, prednisone taper for bronchospasm/inflammation  Continue promethazine with codeine at night And Medicare with over-the-counter antitussives and Tylenol during day as needed Recommend saline irrigation  Patient to followup if symptoms unimproved in next 2 weeks, sooner if worse  Also see problem list.

## 2013-09-02 ENCOUNTER — Other Ambulatory Visit: Payer: Self-pay | Admitting: Internal Medicine

## 2013-09-12 ENCOUNTER — Telehealth: Payer: Self-pay | Admitting: *Deleted

## 2013-09-12 ENCOUNTER — Encounter: Payer: Self-pay | Admitting: Internal Medicine

## 2013-09-12 ENCOUNTER — Ambulatory Visit (INDEPENDENT_AMBULATORY_CARE_PROVIDER_SITE_OTHER): Payer: 59 | Admitting: Internal Medicine

## 2013-09-12 VITALS — BP 180/100 | HR 73 | Temp 98.4°F | Wt 131.0 lb

## 2013-09-12 DIAGNOSIS — I6529 Occlusion and stenosis of unspecified carotid artery: Secondary | ICD-10-CM

## 2013-09-12 DIAGNOSIS — F411 Generalized anxiety disorder: Secondary | ICD-10-CM

## 2013-09-12 DIAGNOSIS — I10 Essential (primary) hypertension: Secondary | ICD-10-CM

## 2013-09-12 MED ORDER — LABETALOL HCL 100 MG PO TABS: 100.0000 mg | ORAL_TABLET | Freq: Two times a day (BID) | ORAL | Status: DC

## 2013-09-12 MED ORDER — LABETALOL HCL 200 MG PO TABS: 200.0000 mg | ORAL_TABLET | Freq: Two times a day (BID) | ORAL | Status: DC

## 2013-09-12 MED ORDER — HYDROCHLOROTHIAZIDE 12.5 MG PO TABS: 12.5000 mg | ORAL_TABLET | Freq: Every day | ORAL | Status: DC

## 2013-09-12 NOTE — Progress Notes (Signed)
Pre-visit discussion using our clinic review tool. No additional management support is needed unless otherwise documented below in the visit note.  

## 2013-09-12 NOTE — Telephone Encounter (Signed)
Patient phoned in stating her blood pressure was continuing to increase, and her symptoms worsening.  Most recent BP 182/84 (however, she had not taken metoprolol yet this morning).  Sxs include headache & dizziness.  She had appt scheduled with PCP for in the morning, but agreed to be seen by another provider today Cathlean Cower, MD at 1800).  I had advised pt (before she agreed to be seen be provider other than PCP) she may need to go to the ED.  She didn't want to go to the emergency room (her originally scheduled appt with PCP was for 1030 tomorrow) if someone else could see her today.  Changed appts & advised pt to go ahead & take her already prescribed metoprolol and to relax and try to stay calm until appt this evening.  Pt verbalized understanding and appreciation.

## 2013-09-12 NOTE — Progress Notes (Signed)
Subjective:    Patient ID: Sharon Paul, female    DOB: 12-04-1945, 68 y.o.   MRN: [id number]  HPI  Here to f/u after a health assessment done yest at work, had several recent HA''s, has been avoiding check ing her BP recently, but several yest and this am elevated - today 184/86. Overall good compliance with treatment, and good medicine tolerability.  Does take some salt with cooking a small bit, no signficant ETOH.  Lost wt to current wt several lbs recently, trying to lose more.  Father with recent stroke, hosp x 3 mo, now in NH in [place], no longer speaks or eats, fighting with brother over his care. Pt denies chest pain, increased sob or doe, wheezing, orthopnea, PND, increased LE swelling, palpitations, dizziness or syncope.  Pt denies new neurological symptoms such as new headache, or facial or extremity weakness or numbness   Pt denies polydipsia, polyuria Past Medical History  Diagnosis Date  . Hypertension   . CVD (cardiovascular disease)   . Vitamin D deficiency   . Allergic rhinitis   . GERD (gastroesophageal reflux disease)   . IBS (irritable bowel syndrome)   . 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 Hyperlipidemia   . Anxiety   . Celiac sprue 2007  . Hx of colonic polyps   . Diverticulosis of colon   . Foot fracture, left 2011  . Osteopenia 01/2011    T score of -1.6 FRAX 14%/1.2%  . Urinary incontinence    Past Surgical History  Procedure Laterality Date  . Esophagogastroduodenoscopy    . Stress cardiolite  04-18-00  . Breast surgery      right breast biopsy  . Rotator cuff repair      RIGHT  . Appendectomy    . 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 She has never used smokeless tobacco. She reports that she drinks alcohol. She reports that she does not use illicit drugs. family history includes Glaucoma in her brother and mother; Heart disease in her father  and mother; Hypertension in her brother, father, mother, paternal grandmother, and sister; Lung cancer in her paternal grandmother. There is no history of Colon cancer. Allergies  Allergen Reactions  . Amlodipine     HA  . Clarithromycin     REACTION: gi upset  . Codeine Nausea And Vomiting  . Esomeprazole Magnesium     REACTION: side pain  . Iohexol      Desc: PT HAS SWELLING TO FACE,LIPS AND THROAT WITH CONTRAST DYE   . Kapidex [Dexlansoprazole]     rash  . Losartan Potassium     REACTION: HA  . Olmesartan Medoxomil     REACTION: weak legs  . Ramipril     Per pt: unknown reaction  . Risedronate Sodium     Per pt: unknown reaction   Current Outpatient Prescriptions on File Prior to Visit  Medication Sig Dispense Refill  . aspirin (BAYER ASPIRIN) 325 MG tablet Take 1 tablet (325 mg total) by mouth daily.  100 tablet  3  . AVAPRO 300 MG tablet Take 1 tablet (300 mg total) by mouth daily.  30 tablet  0  . CRESTOR 5 MG tablet Take 1 tablet (5 mg total) by mouth daily.  90 tablet  1  . estradiol (CLIMARA - DOSED IN MG/24 HR) 0.025 mg/24hr Place 1 patch (0.025 mg  total) onto the skin once a week.  13 patch  4  . FLUoxetine (PROZAC) 10 MG capsule Take 1 capsule by mouth  daily  90 capsule  2  . fluticasone (FLONASE) 50 MCG/ACT nasal spray PLACE 2 SPRAYS INTO THE NOSE DAILY.  16 g  2  . ibuprofen (ADVIL,MOTRIN) 600 MG tablet TAKE 1 TABLET (600 MG TOTAL) BY MOUTH EVERY 8 (EIGHT) HOURS AS NEEDED FOR PAIN.  90 tablet  0  . lipase/protease/amylase (CREON) 12000 UNITS CPEP Take 1 capsule by mouth 3 (three) times daily.  270 capsule  3  . loratadine (CLARITIN) 10 MG tablet Take 10 mg by mouth daily.        [name] LORazepam (ATIVAN) 0.5 MG tablet Take 1 tablet (0.5 mg total) by mouth 2 (two) times daily.  60 tablet  2  . omeprazole (PRILOSEC) 40 MG capsule Take 1 capsule (40 mg total) by mouth daily.  90 capsule  3  . OVER THE COUNTER MEDICATION Hyalo gel Gyn for vaginal dryness      . Probiotic  Product (ALIGN) 4 MG CAPS Take 1 capsule by mouth daily.  30 capsule  6  . valACYclovir (VALTREX) 500 MG tablet Take twice daily for 3-5 days  30 tablet  12  . Vitamin D, Ergocalciferol, (DRISDOL) 50000 UNITS CAPS capsule Take 1 capsule (50,000 Units total) by mouth every 7 (seven) days.  12 capsule  1   No current facility-administered medications on file prior to visit.   ,Review of Systems  Constitutional: Negative for unexpected weight change, or unusual diaphoresis  HENT: Negative for tinnitus.   Eyes: Negative for photophobia and visual disturbance.  Respiratory: Negative for choking and stridor.   Gastrointestinal: Negative for vomiting and blood in stool.  Genitourinary: Negative for hematuria and decreased urine volume.  Musculoskeletal: Negative for acute joint swelling Skin: Negative for color change and wound.  Neurological: Negative for tremors and numbness other than noted  Psychiatric/Behavioral: Negative for decreased concentration or  hyperactivity.       Objective:   Physical Exam BP 180/100  Pulse 73  Temp(Src) 98.4 F (36.9 C) (Oral)  Wt 131 lb (59.421 kg)  SpO2 99% VS noted,  Constitutional: Pt appears well-developed and well-nourished.  HENT: Head: NCAT.  Right Ear: External ear normal.  Left Ear: External ear normal.  Eyes: Conjunctivae and EOM are normal. Pupils are equal, round, and reactive to light.  Neck: Normal range of motion. Neck supple.  Cardiovascular: Normal rate and regular rhythm.   Pulmonary/Chest: Effort normal and breath sounds normal.  Abd:  Soft, NT, non-distended, + BS Neurological: Pt is alert. Not confused  Skin: Skin is warm. No erythema.  Psychiatric: Pt behavior is normal. Thought content normal. 1+ nervous    Assessment & Plan:

## 2013-09-12 NOTE — Patient Instructions (Addendum)
Please take all new medication as prescribed - the labetolol 200 mg twice per day, and the low dose fluid pill  OK to stop the metoprolol  Please continue all other medications as before  Please have the pharmacy call with any other refills you may need.  Please check your blood pressure on a regular basis starting in about 1 wk, your goal is to be at least less than 140/90  Please see Dr Alain Marion in 2 weeks

## 2013-09-13 ENCOUNTER — Ambulatory Visit: Payer: 59 | Admitting: Internal Medicine

## 2013-09-14 NOTE — Assessment & Plan Note (Signed)
Possibly related to situational incr in BP but I doubt signficant, Please continue all other medications as before,  to f/u any worsening symptoms or concerns

## 2013-09-14 NOTE — Assessment & Plan Note (Signed)
Mod to severe, asympt,  to add labetolol, nd hct 12.5, Please continue all other medications as before, and f/u with PCP 2 wks

## 2013-09-14 NOTE — Assessment & Plan Note (Signed)
Likely should have f/u, but declines at this time, will f/u with PCP

## 2013-10-01 ENCOUNTER — Ambulatory Visit (INDEPENDENT_AMBULATORY_CARE_PROVIDER_SITE_OTHER): Payer: 59 | Admitting: Internal Medicine

## 2013-10-01 ENCOUNTER — Encounter: Payer: Self-pay | Admitting: Internal Medicine

## 2013-10-01 VITALS — BP 122/82 | HR 70 | Temp 98.2°F | Ht 59.0 in | Wt 131.0 lb

## 2013-10-01 DIAGNOSIS — I10 Essential (primary) hypertension: Secondary | ICD-10-CM

## 2013-10-01 MED ORDER — AZITHROMYCIN 250 MG PO TABS: ORAL_TABLET | ORAL | Status: DC

## 2013-10-01 NOTE — Patient Instructions (Addendum)
Please take all new medication as prescribed  - the zpack  OK to continue your medications as you are taking now  Please have the pharmacy call with any other refills you may need.  Please continue your efforts at being more active, low cholesterol diet, and weight control.
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
## 2013-10-01 NOTE — Assessment & Plan Note (Addendum)
Improved with addition labetolol 100 bid, ok to stay off the hct, cont all other meds, fu further with PCP BP Readings from Last 3 Encounters:  10/01/13 122/82  09/12/13 180/100  08/15/13 132/88

## 2013-10-01 NOTE — Progress Notes (Signed)
Pre-visit discussion using our clinic review tool. No additional management support is needed unless otherwise documented below in the visit note.  

## 2013-10-08 ENCOUNTER — Ambulatory Visit: Payer: 59 | Admitting: Internal Medicine

## 2013-12-04 ENCOUNTER — Ambulatory Visit (INDEPENDENT_AMBULATORY_CARE_PROVIDER_SITE_OTHER): Payer: 59 | Admitting: Internal Medicine

## 2013-12-04 ENCOUNTER — Encounter: Payer: Self-pay | Admitting: Internal Medicine

## 2013-12-04 VITALS — BP 126/80 | HR 65 | Temp 97.0°F | Ht 59.0 in | Wt 132.0 lb

## 2013-12-04 DIAGNOSIS — Z0000 Encounter for general adult medical examination without abnormal findings: Secondary | ICD-10-CM | POA: Insufficient documentation

## 2013-12-04 DIAGNOSIS — Z23 Encounter for immunization: Secondary | ICD-10-CM

## 2013-12-04 MED ORDER — LABETALOL HCL 100 MG PO TABS: 100.0000 mg | ORAL_TABLET | Freq: Two times a day (BID) | ORAL | Status: DC

## 2013-12-04 MED ORDER — AZITHROMYCIN 250 MG PO TABS: ORAL_TABLET | ORAL | Status: DC

## 2013-12-04 NOTE — Progress Notes (Signed)
Pre visit review using our clinic review tool, if applicable. No additional management support is needed unless otherwise documented below in the visit note. 

## 2013-12-04 NOTE — Progress Notes (Signed)
   Subjective:    HPI  The patient is here for a wellness exam. The patient has been doing well overall without major physical or psychological issues going on lately. The patient needs to address  chronic hypertension that has been well controlled with medicines; to address chronic  hyperlipidemia controlled with diet, allergies      BP is ok at home on Labetalol; stress is better  BP Readings from Last 3 Encounters:  12/04/13 126/80  10/01/13 122/82  09/12/13 180/100   Wt Readings from Last 3 Encounters:  12/04/13 132 lb (59.875 kg)  10/01/13 131 lb (59.421 kg)  09/12/13 131 lb (59.421 kg)     Review of Systems  Constitutional: Negative for activity change, appetite change, fatigue and unexpected weight change.  HENT: Negative for mouth sores.   Eyes: Negative for visual disturbance.  Respiratory: Negative for chest tightness.   Gastrointestinal: Negative for nausea and abdominal pain.  Genitourinary: Negative for urgency, frequency, difficulty urinating and vaginal pain.  Musculoskeletal: Negative for back pain and gait problem.  Skin: Negative for pallor and rash.  Neurological: Negative for dizziness, tremors and weakness.  Psychiatric/Behavioral: Negative for suicidal ideas, confusion and sleep disturbance. The patient is nervous/anxious.        Objective:   Physical Exam  Constitutional: She appears well-developed and well-nourished. No distress.  HENT:  Head: Normocephalic.  Right Ear: External ear normal.  Left Ear: External ear normal.  Nose: Nose normal.  Mouth/Throat: Oropharynx is clear and moist.  Eyes: Conjunctivae are normal. Pupils are equal, round, and reactive to light. Right eye exhibits no discharge. Left eye exhibits no discharge.  Neck: Normal range of motion. Neck supple. No JVD present. No tracheal deviation present. No thyromegaly present.  Cardiovascular: Normal rate, regular rhythm and normal heart sounds.   Pulmonary/Chest: No stridor.  No respiratory distress. She has no wheezes.  Abdominal: Soft. Bowel sounds are normal. She exhibits no distension and no mass. There is no tenderness. There is no rebound and no guarding.  Musculoskeletal: She exhibits no edema and no tenderness.  Lymphadenopathy:    She has no cervical adenopathy.  Neurological: She displays normal reflexes. No cranial nerve deficit. She exhibits normal muscle tone. Coordination normal.  Skin: No rash noted. No erythema.  Psychiatric: She has a normal mood and affect. Her behavior is normal. Judgment and thought content normal.    Lab Results  Component Value Date   WBC 4.5 10/22/2010   HGB 13.6 10/22/2010   HCT 39.4 10/22/2010   PLT 251.0 10/22/2010   GLUCOSE 98 01/29/2013   CHOL 199 01/29/2013   TRIG 138.0 01/29/2013   HDL 47.30 01/29/2013   LDLDIRECT 138.2 10/22/2010   LDLCALC 124* 01/29/2013   ALT 19 01/29/2013   AST 17 01/29/2013   NA 140 01/29/2013   K 4.5 01/29/2013   CL 106 01/29/2013   CREATININE 0.7 01/29/2013   BUN 13 01/29/2013   CO2 30 01/29/2013   TSH 1.68 01/29/2013   HGBA1C 6.0 01/30/2013         Assessment & Plan:

## 2013-12-04 NOTE — Assessment & Plan Note (Signed)
We discussed age appropriate health related issues, including available/recomended screening tests and vaccinations. We discussed a need for adhering to healthy diet and exercise. Labs/EKG were reviewed/ordered. All questions were answered.  Pneumovax GYN Eye exam

## 2013-12-05 ENCOUNTER — Other Ambulatory Visit (INDEPENDENT_AMBULATORY_CARE_PROVIDER_SITE_OTHER): Payer: 59

## 2013-12-05 DIAGNOSIS — Z0000 Encounter for general adult medical examination without abnormal findings: Secondary | ICD-10-CM

## 2013-12-05 LAB — CBC WITH DIFFERENTIAL/PLATELET
BASOS PCT: 0.6 % (ref 0.0–3.0)
Basophils Absolute: 0 10*3/uL (ref 0.0–0.1)
EOS PCT: 1.8 % (ref 0.0–5.0)
Eosinophils Absolute: 0.1 10*3/uL (ref 0.0–0.7)
HCT: 39.9 % (ref 36.0–46.0)
HEMOGLOBIN: 13.4 g/dL (ref 12.0–15.0)
Lymphocytes Relative: 31.2 % (ref 12.0–46.0)
Lymphs Abs: 1.6 10*3/uL (ref 0.7–4.0)
MCHC: 33.5 g/dL (ref 30.0–36.0)
MCV: 89.5 fl (ref 78.0–100.0)
MONO ABS: 0.4 10*3/uL (ref 0.1–1.0)
Monocytes Relative: 8.4 % (ref 3.0–12.0)
NEUTROS ABS: 3 10*3/uL (ref 1.4–7.7)
NEUTROS PCT: 58 % (ref 43.0–77.0)
Platelets: 265 10*3/uL (ref 150.0–400.0)
RBC: 4.46 Mil/uL (ref 3.87–5.11)
RDW: 12.8 % (ref 11.5–14.6)
WBC: 5.2 10*3/uL (ref 4.5–10.5)

## 2013-12-05 LAB — HEPATIC FUNCTION PANEL
ALK PHOS: 75 U/L (ref 39–117)
ALT: 24 U/L (ref 0–35)
AST: 20 U/L (ref 0–37)
Albumin: 4.4 g/dL (ref 3.5–5.2)
BILIRUBIN TOTAL: 0.7 mg/dL (ref 0.3–1.2)
Bilirubin, Direct: 0 mg/dL (ref 0.0–0.3)
TOTAL PROTEIN: 7.3 g/dL (ref 6.0–8.3)

## 2013-12-05 LAB — BASIC METABOLIC PANEL
BUN: 13 mg/dL (ref 6–23)
CALCIUM: 9.7 mg/dL (ref 8.4–10.5)
CO2: 29 mEq/L (ref 19–32)
Chloride: 103 mEq/L (ref 96–112)
Creatinine, Ser: 0.7 mg/dL (ref 0.4–1.2)
GFR: 84.49 mL/min (ref >60.00)
GLUCOSE: 98 mg/dL (ref 70–99)
POTASSIUM: 4.7 meq/L (ref 3.5–5.1)
Sodium: 138 mEq/L (ref 135–145)

## 2013-12-05 LAB — URINALYSIS
BILIRUBIN URINE: NEGATIVE
Hgb urine dipstick: NEGATIVE
KETONES UR: NEGATIVE
LEUKOCYTES UA: NEGATIVE
Nitrite: NEGATIVE
Specific Gravity, Urine: 1.01 (ref 1.000–1.030)
Total Protein, Urine: NEGATIVE
URINE GLUCOSE: NEGATIVE
UROBILINOGEN UA: 0.2 (ref 0.0–1.0)
pH: 7 (ref 5.0–8.0)

## 2013-12-05 LAB — LIPID PANEL
CHOL/HDL RATIO: 4
Cholesterol: 221 mg/dL — ABNORMAL HIGH (ref 0–200)
HDL: 57.4 mg/dL (ref >39.00)
LDL Cholesterol: 127 mg/dL — ABNORMAL HIGH (ref 0–99)
Triglycerides: 181 mg/dL — ABNORMAL HIGH (ref 0.0–149.0)
VLDL: 36.2 mg/dL (ref 0.0–40.0)

## 2013-12-05 LAB — TSH: TSH: 1.68 u[IU]/mL (ref 0.35–5.50)

## 2013-12-10 ENCOUNTER — Other Ambulatory Visit: Payer: Self-pay | Admitting: Internal Medicine

## 2013-12-11 ENCOUNTER — Telehealth: Payer: Self-pay | Admitting: *Deleted

## 2013-12-11 NOTE — Telephone Encounter (Signed)
Left mess for patient to call back.   Pt left vm requesting call back re: a question she has about her labs.

## 2013-12-20 NOTE — Telephone Encounter (Signed)
Left mess for patient to call back.  

## 2014-01-24 ENCOUNTER — Encounter: Payer: 59 | Admitting: Gynecology

## 2014-02-11 ENCOUNTER — Other Ambulatory Visit: Payer: Self-pay | Admitting: Gynecology

## 2014-02-11 ENCOUNTER — Other Ambulatory Visit: Payer: Self-pay | Admitting: Internal Medicine

## 2014-02-19 ENCOUNTER — Ambulatory Visit (INDEPENDENT_AMBULATORY_CARE_PROVIDER_SITE_OTHER): Payer: 59 | Admitting: Gynecology

## 2014-02-19 ENCOUNTER — Encounter: Payer: Self-pay | Admitting: Gynecology

## 2014-02-19 VITALS — BP 120/76 | Ht <= 58 in | Wt 129.0 lb

## 2014-02-19 DIAGNOSIS — N952 Postmenopausal atrophic vaginitis: Secondary | ICD-10-CM

## 2014-02-19 DIAGNOSIS — M8580 Other specified disorders of bone density and structure, unspecified site: Secondary | ICD-10-CM

## 2014-02-19 DIAGNOSIS — M899 Disorder of bone, unspecified: Secondary | ICD-10-CM

## 2014-02-19 DIAGNOSIS — Z01419 Encounter for gynecological examination (general) (routine) without abnormal findings: Secondary | ICD-10-CM

## 2014-02-19 DIAGNOSIS — Z79890 Hormone replacement therapy (postmenopausal): Secondary | ICD-10-CM

## 2014-02-19 DIAGNOSIS — N3946 Mixed incontinence: Secondary | ICD-10-CM

## 2014-02-19 DIAGNOSIS — M949 Disorder of cartilage, unspecified: Secondary | ICD-10-CM

## 2014-02-19 NOTE — Patient Instructions (Addendum)
Try to wean from the estrogen patches this coming fall. Call me if you have any issues or questions.  Schedule your mammogram.  Followup in 1 year for annual exam.

## 2014-02-19 NOTE — Progress Notes (Signed)
Camala Talwar [birth year] [id number]        68 y.o.  [id number] for annual exam.  Several issues noted below.  Past medical history,surgical history, problem list, medications, allergies, family history and social history were all reviewed and documented as reviewed in the EPIC chart.  ROS:  12 system ROS performed with pertinent positives and negatives included in the history, assessment and plan.  Included Systems: General, HEENT, Neck, Cardiovascular, Pulmonary, Gastrointestinal, Genitourinary, Musculoskeletal, Dermatologic, Endocrine, Hematological, Neurologic, Psychiatric Additional significant findings :  None   Exam: Kim assistant Filed Vitals:   02/19/14 1530  BP: 120/76  Height: 4\' 10"  (1.473 m)  Weight: 129 lb (58.514 kg)   General appearance:  Normal affect, orientation and appearance. Skin: Grossly normal HEENT: Without gross lesions.  No cervical or supraclavicular adenopathy. Thyroid normal.  Lungs:  Clear without wheezing, rales or rhonchi Cardiac: RR, without RMG Abdominal:  Soft, nontender, without masses, guarding, rebound, organomegaly or hernia Breasts:  Examined lying and sitting without masses, retractions, discharge or axillary adenopathy. Pelvic:  Ext/BUS/vagina with atrophic changes  Adnexa  Without masses or tenderness    Anus and perineum  Normal   Rectovaginal  Normal sphincter tone without palpated masses or tenderness.    Assessment/Plan:  68 y.o. [id number] female for annual exam.   1. Postmenopausal status post TVH for leiomyoma 1993 on Climara 0.025 patches for hot flushes and sweats.  I again reviewed the whole issue of HRT with her to include the WHI study with increased risk of stroke, heart attack, DVT and breast cancer. The ACOG and NAMS statements for lowest dose for the shortest period of time reviewed. Transdermal versus oral first-pass effect benefit discussed. She did try weaning before and had unacceptable hot flushes. I recommended she  try weaning again this coming fall after the hot weather she agrees with this. I did refill her x1 year in the event she is unsuccessful from weaning as she does want to continue this and accepts the risks. 2. Atrophic vaginitis. Patient uses OTC products to help is comfortable with this. 3. Urinary incontinence. Primarily stress related. Does have some loss when she wakes to void. Is trying Kegel exercises. Recommended behavior modification with more frequent emptying of her bladder. Alternatives to include surgery discussed but declined. 4. Osteopenia.  DEXA 01/2011 T score -1.6 FRAX 14%/1.2% stable from prior DEXA 2009. Recommend repeat next year a 4 year interval. Increase calcium vitamin D reviewed. Does take vitamin D 50,000 units weekly. Vitamin D level of 30 last year. Recheck vitamin D level today. She does have a history of celiac disease. Will continue on the extra vitamin D assuming vitamin D level in the therapeutic range. 5. Pap smear 2012. No Pap smear done today. No history of abnormal Pap smears. Patient is over the age of 43. Status post hysterectomy for benign indications. I reviewed current screening guidelines and we both agree on stop screening. 6. Mammography 01/2013. Recommend scheduling mammogram now and she agrees to do so. SBE monthly reviewed. 7. Colonoscopy 2014. Repeat at their recommended interval. 8. Health maintenance. No routine blood work done as this is done through her primary physician's office. Followup one year, sooner as needed.  Note: This document was prepared with digital dictation and possible smart phrase technology. Any transcriptional errors that result from this process are unintentional.   Anastasio Auerbach MD, 4:10 PM 02/19/2014

## 2014-02-20 LAB — URINALYSIS W MICROSCOPIC + REFLEX CULTURE
Bacteria, UA: NONE SEEN
Bilirubin Urine: NEGATIVE
CASTS: NONE SEEN
CRYSTALS: NONE SEEN
GLUCOSE, UA: NEGATIVE mg/dL
Hgb urine dipstick: NEGATIVE
Ketones, ur: NEGATIVE mg/dL
Leukocytes, UA: NEGATIVE
Nitrite: NEGATIVE
PH: 7 (ref 5.0–8.0)
Protein, ur: NEGATIVE mg/dL
SQUAMOUS EPITHELIAL / LPF: NONE SEEN
Specific Gravity, Urine: <1.005 — ABNORMAL LOW (ref 1.005–1.030)
Urobilinogen, UA: 0.2 mg/dL (ref 0.0–1.0)

## 2014-02-20 LAB — VITAMIN D 25 HYDROXY (VIT D DEFICIENCY, FRACTURES): Vit D, 25-Hydroxy: 35 ng/mL (ref 30–89)

## 2014-04-11 ENCOUNTER — Encounter: Payer: Self-pay | Admitting: Gynecology

## 2014-04-17 ENCOUNTER — Encounter: Payer: Self-pay | Admitting: Gynecology

## 2014-05-21 ENCOUNTER — Other Ambulatory Visit: Payer: Self-pay | Admitting: Internal Medicine

## 2014-06-05 ENCOUNTER — Ambulatory Visit (INDEPENDENT_AMBULATORY_CARE_PROVIDER_SITE_OTHER): Payer: 59 | Admitting: Internal Medicine

## 2014-06-05 ENCOUNTER — Encounter: Payer: Self-pay | Admitting: Internal Medicine

## 2014-06-05 VITALS — BP 142/82 | HR 68 | Temp 98.3°F | Resp 16 | Wt 134.0 lb

## 2014-06-05 DIAGNOSIS — I10 Essential (primary) hypertension: Secondary | ICD-10-CM

## 2014-06-05 MED ORDER — LABETALOL HCL 100 MG PO TABS
100.0000 mg | ORAL_TABLET | Freq: Three times a day (TID) | ORAL | Status: DC
Start: 2014-06-05 — End: 2014-09-16

## 2014-06-05 MED ORDER — LABETALOL HCL 200 MG PO TABS: 200.0000 mg | ORAL_TABLET | Freq: Two times a day (BID) | ORAL | Status: DC

## 2014-06-05 NOTE — Progress Notes (Signed)
Pre visit review using our clinic review tool, if applicable. No additional management support is needed unless otherwise documented below in the visit note. 

## 2014-06-05 NOTE — Patient Instructions (Signed)
A contour pillow   

## 2014-06-05 NOTE — Progress Notes (Signed)
   Subjective:    HPI  C/o elvated BP C/o snoring  The patient needs to address  chronic hypertension that has been well controlled with medicines; to address chronic  hyperlipidemia controlled with diet, allergies      BP is ok at home on Labetalol; stress is better  BP Readings from Last 3 Encounters:  06/05/14 142/82  02/19/14 120/76  12/04/13 126/80   Wt Readings from Last 3 Encounters:  06/05/14 134 lb (60.782 kg)  02/19/14 129 lb (58.514 kg)  12/04/13 132 lb (59.875 kg)     Review of Systems  Constitutional: Negative for activity change, appetite change, fatigue and unexpected weight change.  HENT: Negative for mouth sores.   Eyes: Negative for visual disturbance.  Respiratory: Negative for chest tightness.   Gastrointestinal: Negative for nausea and abdominal pain.  Genitourinary: Negative for urgency, frequency, difficulty urinating and vaginal pain.  Musculoskeletal: Negative for back pain and gait problem.  Skin: Negative for pallor and rash.  Neurological: Negative for dizziness, tremors and weakness.  Psychiatric/Behavioral: Negative for suicidal ideas, confusion and sleep disturbance. The patient is nervous/anxious.        Objective:   Physical Exam  Constitutional: She appears well-developed and well-nourished. No distress.  HENT:  Head: Normocephalic.  Right Ear: External ear normal.  Left Ear: External ear normal.  Nose: Nose normal.  Mouth/Throat: Oropharynx is clear and moist.  Eyes: Conjunctivae are normal. Pupils are equal, round, and reactive to light. Right eye exhibits no discharge. Left eye exhibits no discharge.  Neck: Normal range of motion. Neck supple. No JVD present. No tracheal deviation present. No thyromegaly present.  Cardiovascular: Normal rate, regular rhythm and normal heart sounds.   Pulmonary/Chest: No stridor. No respiratory distress. She has no wheezes.  Abdominal: Soft. Bowel sounds are normal. She exhibits no distension  and no mass. There is no tenderness. There is no rebound and no guarding.  Musculoskeletal: She exhibits no edema and no tenderness.  Lymphadenopathy:    She has no cervical adenopathy.  Neurological: She displays normal reflexes. No cranial nerve deficit. She exhibits normal muscle tone. Coordination normal.  Skin: No rash noted. No erythema.  Psychiatric: She has a normal mood and affect. Her behavior is normal. Judgment and thought content normal.    Lab Results  Component Value Date   WBC 5.2 12/05/2013   HGB 13.4 12/05/2013   HCT 39.9 12/05/2013   PLT 265.0 12/05/2013   GLUCOSE 98 12/05/2013   CHOL 221* 12/05/2013   TRIG 181.0* 12/05/2013   HDL 57.40 12/05/2013   LDLDIRECT 138.2 10/22/2010   LDLCALC 127* 12/05/2013   ALT 24 12/05/2013   AST 20 12/05/2013   NA 138 12/05/2013   K 4.7 12/05/2013   CL 103 12/05/2013   CREATININE 0.7 12/05/2013   BUN 13 12/05/2013   CO2 29 12/05/2013   TSH 1.68 12/05/2013   HGBA1C 6.0 01/30/2013         Assessment & Plan:

## 2014-06-06 NOTE — Assessment & Plan Note (Signed)
Increase Labetalol to 100 mg tid or 200 mg bid Continue with other current prescription therapy as reflected on the Med list.

## 2014-06-30 ENCOUNTER — Encounter: Payer: Self-pay | Admitting: Internal Medicine

## 2014-07-04 ENCOUNTER — Other Ambulatory Visit: Payer: Self-pay | Admitting: Gynecology

## 2014-07-09 ENCOUNTER — Telehealth: Payer: Self-pay | Admitting: *Deleted

## 2014-07-09 NOTE — Telephone Encounter (Signed)
I believe that I had already put the order through to her pharmacy to refill this in the event that she was unable to wean. So it may already be in place but if not then okay to refill times her next annual exam.

## 2014-07-09 NOTE — Telephone Encounter (Signed)
Pt has tried to wean off climara 0.025 mg patch yet once again with no success. Pt said hot flashes and night sweats are back in full force. She was told to call if this should happen. Please advise

## 2014-07-10 MED ORDER — CLIMARA 0.025 MG/24HR TD PTWK: MEDICATED_PATCH | TRANSDERMAL | Status: DC

## 2014-07-10 NOTE — Telephone Encounter (Signed)
Pt would like sent to mail order pharmacy. Rx sent

## 2014-07-10 NOTE — Telephone Encounter (Signed)
Left message on pt voicemail to call with pharmacy to send Rx to.

## 2014-08-14 ENCOUNTER — Ambulatory Visit: Payer: 59 | Admitting: Internal Medicine

## 2014-09-11 ENCOUNTER — Telehealth: Payer: Self-pay | Admitting: *Deleted

## 2014-09-11 ENCOUNTER — Other Ambulatory Visit (INDEPENDENT_AMBULATORY_CARE_PROVIDER_SITE_OTHER): Payer: Self-pay

## 2014-09-11 DIAGNOSIS — Z79899 Other long term (current) drug therapy: Secondary | ICD-10-CM

## 2014-09-11 DIAGNOSIS — I10 Essential (primary) hypertension: Secondary | ICD-10-CM

## 2014-09-11 DIAGNOSIS — E785 Hyperlipidemia, unspecified: Secondary | ICD-10-CM

## 2014-09-11 LAB — BASIC METABOLIC PANEL
BUN: 17 mg/dL (ref 6–23)
CHLORIDE: 106 meq/L (ref 96–112)
CO2: 26 meq/L (ref 19–32)
Calcium: 9.7 mg/dL (ref 8.4–10.5)
Creatinine, Ser: 0.88 mg/dL (ref 0.40–1.20)
GFR: 67.94 mL/min (ref >60.00)
Glucose, Bld: 101 mg/dL — ABNORMAL HIGH (ref 70–99)
POTASSIUM: 4.5 meq/L (ref 3.5–5.1)
SODIUM: 140 meq/L (ref 135–145)

## 2014-09-11 LAB — LIPID PANEL
CHOL/HDL RATIO: 3
Cholesterol: 184 mg/dL (ref 0–200)
HDL: 53.3 mg/dL (ref >39.00)
LDL Cholesterol: 103 mg/dL — ABNORMAL HIGH (ref 0–99)
NonHDL: 130.7
Triglycerides: 141 mg/dL (ref 0.0–149.0)
VLDL: 28.2 mg/dL (ref 0.0–40.0)

## 2014-09-11 LAB — HEPATIC FUNCTION PANEL
ALT: 18 U/L (ref 0–35)
AST: 19 U/L (ref 0–37)
Albumin: 4.5 g/dL (ref 3.5–5.2)
Alkaline Phosphatase: 83 U/L (ref 39–117)
BILIRUBIN TOTAL: 0.5 mg/dL (ref 0.2–1.2)
Bilirubin, Direct: 0.1 mg/dL (ref 0.0–0.3)
Total Protein: 7.5 g/dL (ref 6.0–8.3)

## 2014-09-11 NOTE — Telephone Encounter (Signed)
Pt walk- in stating at her last visit md wanted her to repeat labs b4 appt. No orders in epic. Confirm with md he ok Bmet, Lipid, Hepatic. Labs entered...Sharon Paul

## 2014-09-16 ENCOUNTER — Ambulatory Visit (INDEPENDENT_AMBULATORY_CARE_PROVIDER_SITE_OTHER): Payer: Medicare Other | Admitting: Internal Medicine

## 2014-09-16 ENCOUNTER — Encounter: Payer: Self-pay | Admitting: Internal Medicine

## 2014-09-16 VITALS — BP 168/90 | HR 65 | Temp 97.9°F | Wt 134.0 lb

## 2014-09-16 DIAGNOSIS — M19041 Primary osteoarthritis, right hand: Secondary | ICD-10-CM | POA: Diagnosis not present

## 2014-09-16 DIAGNOSIS — I739 Peripheral vascular disease, unspecified: Secondary | ICD-10-CM | POA: Diagnosis not present

## 2014-09-16 DIAGNOSIS — K589 Irritable bowel syndrome without diarrhea: Secondary | ICD-10-CM

## 2014-09-16 DIAGNOSIS — E785 Hyperlipidemia, unspecified: Secondary | ICD-10-CM

## 2014-09-16 DIAGNOSIS — R7309 Other abnormal glucose: Secondary | ICD-10-CM

## 2014-09-16 DIAGNOSIS — M19049 Primary osteoarthritis, unspecified hand: Secondary | ICD-10-CM | POA: Insufficient documentation

## 2014-09-16 MED ORDER — AVAPRO 300 MG PO TABS: ORAL_TABLET | ORAL | Status: DC

## 2014-09-16 MED ORDER — ROSUVASTATIN CALCIUM 20 MG PO TABS: 20.0000 mg | ORAL_TABLET | Freq: Every day | ORAL | Status: DC

## 2014-09-16 MED ORDER — PANCRELIPASE (LIP-PROT-AMYL) 24000-76000 UNITS PO CPEP: ORAL_CAPSULE | ORAL | Status: DC

## 2014-09-16 NOTE — Patient Instructions (Signed)
Low carb diet  finger mucinous cyst

## 2014-09-16 NOTE — Assessment & Plan Note (Signed)
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

## 2014-09-16 NOTE — Assessment & Plan Note (Signed)
Continue with current prescription therapy as reflected on the Med list.  

## 2014-09-16 NOTE — Progress Notes (Signed)
  BP is nl at home. BP re-checked 140/85  The patient needs to address  chronic hypertension that has been well controlled with medicines; to address chronic  hyperlipidemia controlled with diet, allergies, elevated glucose  BP is ok at home on Labetalol Re-checked BP 140/85  BP Readings from Last 3 Encounters:  09/16/14 168/90  06/05/14 142/82  02/19/14 120/76   Wt Readings from Last 3 Encounters:  09/16/14 134 lb (60.782 kg)  06/05/14 134 lb (60.782 kg)  02/19/14 129 lb (58.514 kg)     Review of Systems  Constitutional: Negative for activity change, appetite change, fatigue and unexpected weight change.  HENT: Negative for mouth sores.   Eyes: Negative for visual disturbance.  Respiratory: Negative for chest tightness.   Gastrointestinal: Negative for nausea and abdominal pain.  Genitourinary: Negative for urgency, frequency, difficulty urinating and vaginal pain.  Musculoskeletal: Negative for back pain and gait problem.  Skin: Negative for pallor and rash.  Neurological: Negative for dizziness, tremors and weakness.  Psychiatric/Behavioral: Negative for suicidal ideas, confusion and sleep disturbance. The patient is nervous/anxious.        Objective:   Physical Exam  Constitutional: She appears well-developed and well-nourished. No distress.  HENT:  Head: Normocephalic.  Right Ear: External ear normal.  Left Ear: External ear normal.  Nose: Nose normal.  Mouth/Throat: Oropharynx is clear and moist.  Eyes: Conjunctivae are normal. Pupils are equal, round, and reactive to light. Right eye exhibits no discharge. Left eye exhibits no discharge.  Neck: Normal range of motion. Neck supple. No JVD present. No tracheal deviation present. No thyromegaly present.  Cardiovascular: Normal rate, regular rhythm and normal heart sounds.   Pulmonary/Chest: No stridor. No respiratory distress. She has no wheezes.  Abdominal: Soft. Bowel sounds are normal. She exhibits no distension  and no mass. There is no tenderness. There is no rebound and no guarding.  Musculoskeletal: She exhibits no edema and no tenderness.  Lymphadenopathy:    She has no cervical adenopathy.  Neurological: She displays normal reflexes. No cranial nerve deficit. She exhibits normal muscle tone. Coordination normal.  Skin: No rash noted. No erythema.  Psychiatric: She has a normal mood and affect. Her behavior is normal. Judgment and thought content normal.  R 5th finger mucinous cyst 4 mm  Lab Results  Component Value Date   WBC 5.2 12/05/2013   HGB 13.4 12/05/2013   HCT 39.9 12/05/2013   PLT 265.0 12/05/2013   GLUCOSE 101* 09/11/2014   CHOL 184 09/11/2014   TRIG 141.0 09/11/2014   HDL 53.30 09/11/2014   LDLDIRECT 138.2 10/22/2010   LDLCALC 103* 09/11/2014   ALT 18 09/11/2014   AST 19 09/11/2014   NA 140 09/11/2014   K 4.5 09/11/2014   CL 106 09/11/2014   CREATININE 0.88 09/11/2014   BUN 17 09/11/2014   CO2 26 09/11/2014   TSH 1.68 12/05/2013   HGBA1C 6.0 01/30/2013         Assessment & Plan:

## 2014-09-16 NOTE — Assessment & Plan Note (Signed)
Creon helped  Continue with Creon prn.

## 2014-09-16 NOTE — Addendum Note (Signed)
Addended by: Cassandria Anger on: 09/16/2014 05:08 PM   Modules accepted: Orders

## 2014-09-23 ENCOUNTER — Telehealth: Payer: Self-pay | Admitting: Internal Medicine

## 2014-09-23 NOTE — Telephone Encounter (Signed)
Pt called in said that that she talked to dr plot about sending in some   lipase/protease/amylase (CREON) 12000 UNITS CPEP    [facility] [address]

## 2014-09-24 ENCOUNTER — Telehealth: Payer: Self-pay | Admitting: Internal Medicine

## 2014-09-24 MED ORDER — IRBESARTAN 300 MG PO TABS: 150.0000 mg | ORAL_TABLET | Freq: Two times a day (BID) | ORAL | Status: DC

## 2014-09-24 MED ORDER — PANCRELIPASE (LIP-PROT-AMYL) 24000-76000 UNITS PO CPEP: ORAL_CAPSULE | ORAL | Status: DC

## 2014-09-24 NOTE — Telephone Encounter (Signed)
Notified pt inform her md sent rx to [facility]. Inform her will [facility] & resend [facility] rx...Sharon Paul

## 2014-09-24 NOTE — Telephone Encounter (Signed)
Caller name: Charitie Relation to pt: self Call back number: [phone] or [phone] Pharmacy: [facility] [address]  Reason for call:   Patient states that the manufacturer is telling her that they are out of Avapro and currently are not making anymore. She would like to know if she can take a generic for this drug or try an alternative.

## 2014-09-24 NOTE — Telephone Encounter (Signed)
Notified pt with md response will try the generic for avapro sending to [facility].../lmb

## 2014-09-24 NOTE — Telephone Encounter (Signed)
Yes. I would try a generic first Thx

## 2014-09-29 ENCOUNTER — Encounter: Payer: Self-pay | Admitting: Nurse Practitioner

## 2014-09-29 ENCOUNTER — Ambulatory Visit (INDEPENDENT_AMBULATORY_CARE_PROVIDER_SITE_OTHER): Payer: Medicare Other | Admitting: Nurse Practitioner

## 2014-09-29 VITALS — BP 116/78 | HR 66 | Temp 97.5°F | Ht 59.0 in | Wt 133.0 lb

## 2014-09-29 DIAGNOSIS — J069 Acute upper respiratory infection, unspecified: Secondary | ICD-10-CM | POA: Diagnosis not present

## 2014-09-29 MED ORDER — GUAIFENESIN ER 600 MG PO TB12: 600.0000 mg | ORAL_TABLET | Freq: Two times a day (BID) | ORAL | Status: DC

## 2014-09-29 NOTE — Progress Notes (Signed)
   Subjective:    Patient ID: Sharon Paul, female    DOB: March 23, 1946, 69 y.o.   MRN: [id number]  URI  This is a new problem. The current episode started 1 to 4 weeks ago (1 wk). The problem has been gradually worsening. There has been no fever. Associated symptoms include congestion, coughing, headaches, a plugged ear sensation and sinus pain. Pertinent negatives include no chest pain, diarrhea, ear pain, nausea, sneezing, sore throat, swollen glands, vomiting or wheezing. Treatments tried: robitussin, coricidin. The treatment provided no relief.      Review of Systems  Constitutional: Negative for fever, appetite change and fatigue.  HENT: Positive for congestion and postnasal drip. Negative for ear pain, sneezing and sore throat.   Respiratory: Positive for cough. Negative for chest tightness, shortness of breath and wheezing.   Cardiovascular: Negative for chest pain.  Gastrointestinal: Negative for nausea, vomiting and diarrhea.  Neurological: Positive for headaches.       Objective:   Physical Exam  Constitutional: She is oriented to person, place, and time. She appears well-developed and well-nourished. No distress.  HENT:  Head: Normocephalic and atraumatic.  Right Ear: External ear normal.  Left Ear: External ear normal.  Mouth/Throat: Oropharynx is clear and moist. No oropharyngeal exudate.  Eyes: Conjunctivae are normal. Right eye exhibits no discharge. Left eye exhibits no discharge.  Neck: Normal range of motion. Neck supple. No thyromegaly present.  Cardiovascular: Normal rate, regular rhythm and normal heart sounds.   Pulmonary/Chest: Effort normal and breath sounds normal. No respiratory distress. She has no wheezes. She has no rales.  Lymphadenopathy:    She has no cervical adenopathy.  Neurological: She is alert and oriented to person, place, and time.  Skin: Skin is warm and dry.  Psychiatric: She has a normal mood and affect. Her behavior is normal.  Thought content normal.  Vitals reviewed.         Assessment & Plan:  1. Upper respiratory infection with cough and congestion viral - guaiFENesin (MUCINEX) 600 MG 12 hr tablet; Take 1 tablet (600 mg total) by mouth 2 (two) times daily.  Dispense: 30 tablet; Refill: 0 Daily sinus rinse F/u PRN

## 2014-09-29 NOTE — Patient Instructions (Signed)
You have a virus causing your symptoms. The average duration of cold symptoms is 14 days.  For nasal congestion: Start daily sinus rinses (neilmed Sinus Rinse). Vicks vapor rub under nose to help breathe. For sore throat: Benzocaine throat lozenges for sore throat. For cough:  Use guaifenesen 600 mg twice daily if you are coughing up mucous.  Tylenol or ibuprophen for headache. Sip fluids every hour. Rest. If you are not feeling better in 10 days or develop fever or chest pain, call us for re-evaluation. Feel better!  Upper Respiratory Infection, Adult An upper respiratory infection (URI) is also sometimes known as the common cold. The upper respiratory tract includes the nose, sinuses, throat, trachea, and bronchi. Bronchi are the airways leading to the lungs. Most people improve within 1 week, but symptoms can last up to 2 weeks. A residual cough may last even longer.  CAUSES Many different viruses can infect the tissues lining the upper respiratory tract. The tissues become irritated and inflamed and often become very moist. Mucus production is also common. A cold is contagious. You can easily spread the virus to others by oral contact. This includes kissing, sharing a glass, coughing, or sneezing. Touching your mouth or nose and then touching a surface, which is then touched by another person, can also spread the virus. SYMPTOMS  Symptoms typically develop 1 to 3 days after you come in contact with a cold virus. Symptoms vary from person to person. They may include:  Runny nose.  Sneezing.  Nasal congestion.  Sinus irritation.  Sore throat.  Loss of voice (laryngitis).  Cough.  Fatigue.  Muscle aches.  Loss of appetite.  Headache.  Low-grade fever. DIAGNOSIS  You might diagnose your own cold based on familiar symptoms, since most people get a cold 2 to 3 times a year. Your caregiver can confirm this based on your exam. Most importantly, your caregiver can check that your  symptoms are not due to another disease such as strep throat, sinusitis, pneumonia, asthma, or epiglottitis. Blood tests, throat tests, and X-rays are not necessary to diagnose a common cold, but they may sometimes be helpful in excluding other more serious diseases. Your caregiver will decide if any further tests are required. RISKS AND COMPLICATIONS  You may be at risk for a more severe case of the common cold if you smoke cigarettes, have chronic heart disease (such as heart failure) or lung disease (such as asthma), or if you have a weakened immune system. The very young and very old are also at risk for more serious infections. Bacterial sinusitis, middle ear infections, and bacterial pneumonia can complicate the common cold. The common cold can worsen asthma and chronic obstructive pulmonary disease (COPD). Sometimes, these complications can require emergency medical care and may be life-threatening. PREVENTION  The best way to protect against getting a cold is to practice good hygiene. Avoid oral or hand contact with people with cold symptoms. Wash your hands often if contact occurs. There is no clear evidence that vitamin C, vitamin E, echinacea, or exercise reduces the chance of developing a cold. However, it is always recommended to get plenty of rest and practice good nutrition. TREATMENT  Treatment is directed at relieving symptoms. There is no cure. Antibiotics are not effective, because the infection is caused by a virus, not by bacteria. Treatment may include:  Increased fluid intake. Sports drinks offer valuable electrolytes, sugars, and fluids.  Breathing heated mist or steam (vaporizer or shower).  Eating chicken soup or  other clear broths, and maintaining good nutrition.  Getting plenty of rest.  Using gargles or lozenges for comfort.  Controlling fevers with ibuprofen or acetaminophen as directed by your caregiver.  Increasing usage of your inhaler if you have asthma. Zinc  gel and zinc lozenges, taken in the first 24 hours of the common cold, can shorten the duration and lessen the severity of symptoms. Pain medicines may help with fever, muscle aches, and throat pain. A variety of non-prescription medicines are available to treat congestion and runny nose. Your caregiver can make recommendations and may suggest nasal or lung inhalers for other symptoms.  HOME CARE INSTRUCTIONS   Only take over-the-counter or prescription medicines for pain, discomfort, or fever as directed by your caregiver.  Use a warm mist humidifier or inhale steam from a shower to increase air moisture. This may keep secretions moist and make it easier to breathe.  Drink enough water and fluids to keep your urine clear or pale yellow.  Rest as needed.  Return to work when your temperature has returned to normal or as your caregiver advises. You may need to stay home longer to avoid infecting others. You can also use a face mask and careful hand washing to prevent spread of the virus. SEEK MEDICAL CARE IF:   After the first few days, you feel you are getting worse rather than better.  You need your caregiver's advice about medicines to control symptoms.  You develop chills, worsening shortness of breath, or brown or red sputum. These may be signs of pneumonia.  You develop yellow or brown nasal discharge or pain in the face, especially when you bend forward. These may be signs of sinusitis.  You develop a fever, swollen neck glands, pain with swallowing, or white areas in the back of your throat. These may be signs of strep throat. SEEK IMMEDIATE MEDICAL CARE IF:   You have a fever.  You develop severe or persistent headache, ear pain, sinus pain, or chest pain.  You develop wheezing, a prolonged cough, cough up blood, or have a change in your usual mucus (if you have chronic lung disease).  You develop sore muscles or a stiff neck. Document Released: 02/08/2001 Document Revised:  11/07/2011 Document Reviewed: 12/17/2010 [facility] Patient Information 2014 [place], [place].

## 2014-09-29 NOTE — Progress Notes (Signed)
Pre visit review using our clinic review tool, if applicable. No additional management support is needed unless otherwise documented below in the visit note. 

## 2014-10-02 ENCOUNTER — Encounter: Payer: Self-pay | Admitting: Women's Health

## 2014-10-02 ENCOUNTER — Other Ambulatory Visit: Payer: Self-pay

## 2014-10-02 MED ORDER — VALACYCLOVIR HCL 500 MG PO TABS: ORAL_TABLET | ORAL | Status: DC

## 2014-10-07 DIAGNOSIS — H2513 Age-related nuclear cataract, bilateral: Secondary | ICD-10-CM | POA: Diagnosis not present

## 2014-11-12 ENCOUNTER — Other Ambulatory Visit: Payer: Self-pay | Admitting: *Deleted

## 2014-11-12 MED ORDER — LABETALOL HCL 200 MG PO TABS: 200.0000 mg | ORAL_TABLET | Freq: Two times a day (BID) | ORAL | Status: DC

## 2014-11-20 ENCOUNTER — Encounter: Payer: Self-pay | Admitting: Internal Medicine

## 2014-11-20 ENCOUNTER — Ambulatory Visit (INDEPENDENT_AMBULATORY_CARE_PROVIDER_SITE_OTHER): Payer: Medicare Other | Admitting: Internal Medicine

## 2014-11-20 ENCOUNTER — Other Ambulatory Visit (INDEPENDENT_AMBULATORY_CARE_PROVIDER_SITE_OTHER): Payer: Medicare Other

## 2014-11-20 ENCOUNTER — Ambulatory Visit: Payer: Medicare Other | Admitting: Internal Medicine

## 2014-11-20 VITALS — BP 116/78 | HR 53 | Temp 97.5°F | Ht <= 58 in | Wt 134.0 lb

## 2014-11-20 DIAGNOSIS — R109 Unspecified abdominal pain: Secondary | ICD-10-CM

## 2014-11-20 DIAGNOSIS — R350 Frequency of micturition: Secondary | ICD-10-CM

## 2014-11-20 DIAGNOSIS — R300 Dysuria: Secondary | ICD-10-CM

## 2014-11-20 LAB — URINALYSIS
BILIRUBIN URINE: NEGATIVE
Hgb urine dipstick: NEGATIVE
Ketones, ur: NEGATIVE
Leukocytes, UA: NEGATIVE
Nitrite: NEGATIVE
Specific Gravity, Urine: <=1.005 — AB (ref 1.000–1.030)
Total Protein, Urine: NEGATIVE
Urine Glucose: NEGATIVE
Urobilinogen, UA: 0.2 (ref 0.0–1.0)
pH: 6 (ref 5.0–8.0)

## 2014-11-20 MED ORDER — PHENAZOPYRIDINE HCL 200 MG PO TABS: 200.0000 mg | ORAL_TABLET | Freq: Three times a day (TID) | ORAL | Status: DC | PRN

## 2014-11-20 NOTE — Progress Notes (Signed)
   Subjective:    Patient ID: Sharon Paul, female    DOB: 1945/12/15, 69 y.o.   MRN: [id number]  HPI  Her symptoms began 11/17/14 as very dark yellow urine. This was associated with bilateral flank pain and subsequently dysuria. She's had some frequency. Her symptoms have improved with a cranberry supplement.  She's had no antibodies in the last 30 days. She has no history of recurrent urinary tract infections. She has no genitourinary anomalies and has had no GU procedures. She's not a diabetic.    Review of Systems She denies hesitancy, urgency, hematuria, pyuria, fever, chills, or sweats.    Objective:   Physical Exam   She is a very petite female in good health and well nourished w/o distress.  Eyes: No conjunctival inflammation or scleral icterus is present.  Oral exam: Dental hygiene is good; lips and gums are healthy appearing.There is no oropharyngeal erythema or exudate noted.   Heart:  Normal rate and regular rhythm. S1 and S2 normal without gallop, murmur, click, rub or other extra sounds     Lungs:Chest clear to auscultation; no wheezes, rhonchi,rales ,or rubs present.No increased work of breathing.   Abdomen: bowel sounds normal, soft but slightly tender in supra pubic area without masses, organomegaly or hernias noted.  No guarding or rebound . No tenderness over the flanks to percussion  Musculoskeletal: Able to lie flat and sit up without help. Negative straight leg raising bilaterally. Gait normal  Skin:Warm & dry.  Intact without suspicious lesions or rashes ; no jaundice or tenting  Lymphatic: No lymphadenopathy is noted about the head, neck, axilla            Assessment & Plan:  #1 frequency, dysuria, & flank pain with a normal urinalysis  Plan: See orders and recommendations

## 2014-11-20 NOTE — Patient Instructions (Signed)
Drink as much nondairy fluids as possible. Avoid spicy foods or alcohol as  these may aggravate the bladder. Do not take decongestants. Avoid narcotics if possible. 

## 2014-11-20 NOTE — Progress Notes (Signed)
Pre visit review using our clinic review tool, if applicable. No additional management support is needed unless otherwise documented below in the visit note. 

## 2014-11-21 LAB — URINE CULTURE

## 2014-12-03 ENCOUNTER — Telehealth: Payer: Self-pay | Admitting: Internal Medicine

## 2014-12-03 ENCOUNTER — Telehealth: Payer: Self-pay

## 2014-12-03 ENCOUNTER — Other Ambulatory Visit: Payer: Self-pay

## 2014-12-03 MED ORDER — CRESTOR 5 MG PO TABS: ORAL_TABLET | ORAL | Status: DC

## 2014-12-03 MED ORDER — IBUPROFEN 600 MG PO TABS: ORAL_TABLET | ORAL | Status: DC

## 2014-12-03 NOTE — Telephone Encounter (Signed)
Printed rx for both med requests, waiting for md to sign

## 2014-12-03 NOTE — Telephone Encounter (Signed)
Pt called and wants to know if she can get a written script for   CRESTOR 5 MG tablet [[id number]] IBupropion 600mg    She want to come pick them up  Best number [phone]

## 2014-12-03 NOTE — Telephone Encounter (Signed)
Pt advised to pick up signed rx

## 2014-12-03 NOTE — Telephone Encounter (Signed)
Advised pt that crestor and ibuprofen rx are waiting for her to pick up at the front desk

## 2014-12-22 ENCOUNTER — Encounter: Payer: Self-pay | Admitting: Family

## 2014-12-22 ENCOUNTER — Ambulatory Visit (INDEPENDENT_AMBULATORY_CARE_PROVIDER_SITE_OTHER): Payer: Medicare Other | Admitting: Family

## 2014-12-22 VITALS — BP 128/70 | HR 66 | Temp 98.1°F | Resp 18 | Ht 59.0 in | Wt 133.8 lb

## 2014-12-22 DIAGNOSIS — R059 Cough, unspecified: Secondary | ICD-10-CM | POA: Insufficient documentation

## 2014-12-22 DIAGNOSIS — R05 Cough: Secondary | ICD-10-CM | POA: Diagnosis not present

## 2014-12-22 MED ORDER — CEFUROXIME AXETIL 250 MG PO TABS: 250.0000 mg | ORAL_TABLET | Freq: Two times a day (BID) | ORAL | Status: DC

## 2014-12-22 NOTE — Progress Notes (Signed)
Subjective:    Patient ID: Sharon Paul, female    DOB: 1946-04-08, 69 y.o.   MRN: [id number]  Chief Complaint  Patient presents with  . Cough    x3 days, sore throat, congestion, dry cough, tight chest, losing voice, drainage, and hurts to talk, body aches    HPI:  Sharon Paul is a 69 y.o. female with a PMH of hypertension, GERD, osteoarthritis, and PVD who presents today for an acute office visit.  This is a new problem. Associated symptoms of sore throat, congestion, cough, chest tightness, change in voice and body aches has been going on for about 3 days. Indicates she has continually worsened over the course of the 3 days. Modifying factors include Tylenol, claritin, robatussin and Benedryl at night which have provided minimal relief. Indicates worsening today compared to yesterday.    Allergies  Allergen Reactions  . Amlodipine     HA  . Clarithromycin     REACTION: gi upset  . Codeine Nausea And Vomiting  . Esomeprazole Magnesium     REACTION: side pain  . Iohexol      Desc: PT HAS SWELLING TO FACE,LIPS AND THROAT WITH CONTRAST DYE   . Kapidex [Dexlansoprazole]     rash  . Losartan Potassium     REACTION: HA  . Olmesartan Medoxomil     REACTION: weak legs  . Ramipril     Per pt: unknown reaction  . Risedronate Sodium     Per pt: unknown reaction    Current Outpatient Prescriptions on File Prior to Visit  Medication Sig Dispense Refill  . aspirin 81 MG tablet Take 81 mg by mouth daily.    [name] CLIMARA 0.025 MG/24HR patch Apply 1 patch weekly 12 patch 3  . CRESTOR 5 MG tablet Take 1 tablet (5 mg total)  by mouth daily. 90 tablet 3  . FLUoxetine (PROZAC) 10 MG capsule Take 1 capsule by mouth  daily 90 capsule 2  . fluticasone (FLONASE) 50 MCG/ACT nasal spray PLACE 2 SPRAYS INTO THE NOSE DAILY. 16 g 2  . guaiFENesin (MUCINEX) 600 MG 12 hr tablet Take 1 tablet (600 mg total) by mouth 2 (two) times daily. 30 tablet 0  . ibuprofen (ADVIL,MOTRIN)  600 MG tablet TAKE 1 TABLET (600 MG TOTAL) BY MOUTH EVERY 8 (EIGHT) HOURS AS NEEDED FOR PAIN. 90 tablet 3  . irbesartan (AVAPRO) 300 MG tablet Take 0.5 tablets (150 mg total) by mouth 2 (two) times daily. 30 tablet 3  . labetalol (NORMODYNE) 200 MG tablet Take 1 tablet (200 mg total) by mouth 2 (two) times daily. 180 tablet 2  . LORazepam (ATIVAN) 0.5 MG tablet Take 1 tablet (0.5 mg total) by mouth 2 (two) times daily. 60 tablet 2  . omeprazole (PRILOSEC) 40 MG capsule Take 1 capsule (40 mg  total) by mouth daily. 90 capsule 3  . OVER THE COUNTER MEDICATION Hyalo gel Gyn for vaginal dryness    . Pancrelipase, Lip-Prot-Amyl, 24000 UNITS CPEP 1 po tid ac or w/meal prn 90 capsule 0  . phenazopyridine (PYRIDIUM) 200 MG tablet Take 1 tablet (200 mg total) by mouth 3 (three) times daily as needed for pain. 15 tablet 0  . Probiotic Product (ALIGN) 4 MG CAPS Take 1 capsule by mouth daily. 30 capsule 6  . rosuvastatin (CRESTOR) 20 MG tablet Take 1 tablet (20 mg total) by mouth daily. 90 tablet 3  . valACYclovir (VALTREX) 500 MG tablet Take twice daily for 3-5 days 30 tablet  6  . Vitamin D, Ergocalciferol, (DRISDOL) 50000 UNITS CAPS capsule Take 1 capsule by mouth  every 7 days. 11 capsule 2   No current facility-administered medications on file prior to visit.     Review of Systems  Constitutional: Positive for chills. Negative for fever.  HENT: Positive for congestion, sinus pressure, sore throat and voice change.   Respiratory: Positive for cough and chest tightness. Negative for shortness of breath.   Neurological: Positive for headaches.      Objective:    BP 128/70 mmHg  Pulse 66  Temp(Src) 98.1 F (36.7 C) (Oral)  Resp 18  Ht 4\' 11"  (1.499 m)  Wt 133 lb 12.8 oz (60.691 kg)  BMI 27.01 kg/m2  SpO2 97% Nursing note and vital signs reviewed.  Physical Exam  Constitutional: She is oriented to person, place, and time. She appears well-developed and well-nourished. No distress.  HENT:    Right Ear: Hearing, tympanic membrane, external ear and ear canal normal.  Left Ear: Hearing, tympanic membrane, external ear and ear canal normal.  Nose: Right sinus exhibits maxillary sinus tenderness and frontal sinus tenderness. Left sinus exhibits maxillary sinus tenderness and frontal sinus tenderness.  Mouth/Throat: Uvula is midline, oropharynx is clear and moist and mucous membranes are normal.  Cardiovascular: Normal rate, regular rhythm, normal heart sounds and intact distal pulses.   Pulmonary/Chest: Effort normal and breath sounds normal.  Neurological: She is alert and oriented to person, place, and time.  Skin: Skin is warm and dry.  Psychiatric: She has a normal mood and affect. Her behavior is normal. Judgment and thought content normal.       Assessment & Plan:

## 2014-12-22 NOTE — Progress Notes (Signed)
Pre visit review using our clinic review tool, if applicable. No additional management support is needed unless otherwise documented below in the visit note. 

## 2014-12-22 NOTE — Assessment & Plan Note (Signed)
Symptoms and exam consistent with sinusitis. Start Ceftin. Continue over-the-counter medication as needed for symptom relief and supportive care. Follow-up if symptoms worsen or fail to improve.

## 2014-12-22 NOTE — Patient Instructions (Addendum)
Thank you for choosing [facility].  Summary/Instructions:  Your prescription(s) have been submitted to your pharmacy or been printed and provided for you. Please take as directed and contact our office if you believe you are having problem(s) with the medication(s) or have any questions.  If your symptoms worsen or fail to improve, please contact our office for further instruction, or in case of emergency go directly to the emergency room at the closest medical facility.   General Recommendations:    Please drink plenty of fluids.  Get plenty of rest   Sleep in humidified air  Use saline nasal sprays  Netti pot   OTC Medications:  Decongestants - helps relieve congestion   Flonase (generic fluticasone) or Nasacort (generic triamcinolone) - please make sure to use the "cross-over" technique at a 45 degree angle towards the opposite eye as opposed to straight up the nasal passageway.   Sudafed (generic pseudoephedrine - Note this is the one that is available behind the pharmacy counter); Products with phenylephrine (-PE) may also be used but is often not as effective as pseudoephedrine.   If you have HIGH BLOOD PRESSURE - Coricidin HBP; AVOID any product that is -D as this contains pseudoephedrine which may increase your blood pressure.  Afrin (oxymetazoline) every 6-8 hours for up to 3 days.   Allergies - helps relieve runny nose, itchy eyes and sneezing   Claritin (generic loratidine), Allegra (fexofenidine), or Zyrtec (generic cyrterizine) for runny nose. These medications should not cause drowsiness.  Note - Benadryl (generic diphenhydramine) may be used however may cause drowsiness  Cough -   Delsym or Robitussin (generic dextromethorphan)  Expectorants - helps loosen mucus to ease removal   Mucinex (generic guaifenesin) as directed on the package.  Headaches / General Aches   Tylenol (generic acetaminophen) - DO NOT EXCEED 3 grams (3,000 mg) in a 24  hour time period  Advil/Motrin (generic ibuprofen)   Sore Throat -   Salt water gargle   Chloraseptic (generic benzocaine) spray or lozenges / Sucrets (generic dyclonine)      

## 2014-12-25 ENCOUNTER — Telehealth: Payer: Self-pay | Admitting: Internal Medicine

## 2014-12-25 NOTE — Telephone Encounter (Signed)
Pt called in said that she has high blood pressure and can take just anything for a cough.  What is suggested for her to talk.  She is still coughing everynight.  Best number [phone]

## 2014-12-25 NOTE — Telephone Encounter (Signed)
Use Delsym OTC syrup prn and Loratidine 10 mg/d Thx

## 2014-12-25 NOTE — Telephone Encounter (Signed)
Notified pt with md response.../lmb 

## 2015-01-07 ENCOUNTER — Other Ambulatory Visit (INDEPENDENT_AMBULATORY_CARE_PROVIDER_SITE_OTHER): Payer: Medicare Other

## 2015-01-07 DIAGNOSIS — K589 Irritable bowel syndrome without diarrhea: Secondary | ICD-10-CM

## 2015-01-07 DIAGNOSIS — E785 Hyperlipidemia, unspecified: Secondary | ICD-10-CM

## 2015-01-07 DIAGNOSIS — I739 Peripheral vascular disease, unspecified: Secondary | ICD-10-CM | POA: Diagnosis not present

## 2015-01-07 DIAGNOSIS — M19041 Primary osteoarthritis, right hand: Secondary | ICD-10-CM | POA: Diagnosis not present

## 2015-01-07 DIAGNOSIS — R7309 Other abnormal glucose: Secondary | ICD-10-CM | POA: Diagnosis not present

## 2015-01-07 LAB — BASIC METABOLIC PANEL
BUN: 14 mg/dL (ref 6–23)
CO2: 29 meq/L (ref 19–32)
Calcium: 9.6 mg/dL (ref 8.4–10.5)
Chloride: 104 mEq/L (ref 96–112)
Creatinine, Ser: 0.74 mg/dL (ref 0.40–1.20)
GFR: 82.9 mL/min (ref >60.00)
GLUCOSE: 101 mg/dL — AB (ref 70–99)
Potassium: 4.3 mEq/L (ref 3.5–5.1)
Sodium: 137 mEq/L (ref 135–145)

## 2015-01-07 LAB — HEPATIC FUNCTION PANEL
ALT: 18 U/L (ref 0–35)
AST: 20 U/L (ref 0–37)
Albumin: 4.2 g/dL (ref 3.5–5.2)
Alkaline Phosphatase: 85 U/L (ref 39–117)
BILIRUBIN DIRECT: 0.1 mg/dL (ref 0.0–0.3)
Total Bilirubin: 0.5 mg/dL (ref 0.2–1.2)
Total Protein: 7 g/dL (ref 6.0–8.3)

## 2015-01-07 LAB — LIPID PANEL
Cholesterol: 189 mg/dL (ref 0–200)
HDL: 56.4 mg/dL (ref >39.00)
LDL Cholesterol: 106 mg/dL — ABNORMAL HIGH (ref 0–99)
NonHDL: 132.6
Total CHOL/HDL Ratio: 3
Triglycerides: 131 mg/dL (ref 0.0–149.0)
VLDL: 26.2 mg/dL (ref 0.0–40.0)

## 2015-01-07 LAB — TSH: TSH: 1.6 u[IU]/mL (ref 0.35–4.50)

## 2015-01-07 LAB — HEMOGLOBIN A1C: HEMOGLOBIN A1C: 6.1 % (ref 4.6–6.5)

## 2015-01-14 ENCOUNTER — Encounter: Payer: Self-pay | Admitting: Internal Medicine

## 2015-01-14 ENCOUNTER — Ambulatory Visit (INDEPENDENT_AMBULATORY_CARE_PROVIDER_SITE_OTHER): Payer: Medicare Other | Admitting: Internal Medicine

## 2015-01-14 VITALS — BP 150/88 | HR 67 | Wt 133.0 lb

## 2015-01-14 DIAGNOSIS — F411 Generalized anxiety disorder: Secondary | ICD-10-CM

## 2015-01-14 DIAGNOSIS — Z23 Encounter for immunization: Secondary | ICD-10-CM | POA: Diagnosis not present

## 2015-01-14 MED ORDER — FLUOXETINE HCL 10 MG PO CAPS: ORAL_CAPSULE | ORAL | Status: DC

## 2015-01-14 MED ORDER — LORAZEPAM 0.5 MG PO TABS: 0.5000 mg | ORAL_TABLET | Freq: Two times a day (BID) | ORAL | Status: DC

## 2015-01-14 MED ORDER — OMEPRAZOLE 40 MG PO CPDR: DELAYED_RELEASE_CAPSULE | ORAL | Status: DC

## 2015-01-14 MED ORDER — IRBESARTAN 300 MG PO TABS: 150.0000 mg | ORAL_TABLET | Freq: Two times a day (BID) | ORAL | Status: DC

## 2015-01-14 MED ORDER — CRESTOR 5 MG PO TABS: ORAL_TABLET | ORAL | Status: DC

## 2015-01-14 NOTE — Progress Notes (Signed)
Pre visit review using our clinic review tool, if applicable. No additional management support is needed unless otherwise documented below in the visit note. 

## 2015-01-14 NOTE — Progress Notes (Signed)
   Subjective:    HPI  F/u elvated BP - nl BP at home F/u snoring - better  The patient needs to address  chronic hypertension that has been well controlled with medicines; to address chronic  hyperlipidemia controlled with diet, allergies  BP is ok at home on Labetalol; stress is better  BP Readings from Last 3 Encounters:  12/22/14 128/70  11/20/14 116/78  09/29/14 116/78   Wt Readings from Last 3 Encounters:  01/14/15 133 lb (60.328 kg)  12/22/14 133 lb 12.8 oz (60.691 kg)  11/20/14 134 lb (60.782 kg)     Review of Systems  Constitutional: Negative for activity change, appetite change, fatigue and unexpected weight change.  HENT: Negative for mouth sores.   Eyes: Negative for visual disturbance.  Respiratory: Negative for chest tightness.   Gastrointestinal: Negative for nausea and abdominal pain.  Genitourinary: Negative for urgency, frequency, difficulty urinating and vaginal pain.  Musculoskeletal: Negative for back pain and gait problem.  Skin: Negative for pallor and rash.  Neurological: Negative for dizziness, tremors and weakness.  Psychiatric/Behavioral: Negative for suicidal ideas, confusion and sleep disturbance. The patient is nervous/anxious.        Objective:   Physical Exam  Constitutional: She appears well-developed and well-nourished. No distress.  HENT:  Head: Normocephalic.  Right Ear: External ear normal.  Left Ear: External ear normal.  Nose: Nose normal.  Mouth/Throat: Oropharynx is clear and moist.  Eyes: Conjunctivae are normal. Pupils are equal, round, and reactive to light. Right eye exhibits no discharge. Left eye exhibits no discharge.  Neck: Normal range of motion. Neck supple. No JVD present. No tracheal deviation present. No thyromegaly present.  Cardiovascular: Normal rate, regular rhythm and normal heart sounds.   Pulmonary/Chest: No stridor. No respiratory distress. She has no wheezes.  Abdominal: Soft. Bowel sounds are normal.  She exhibits no distension and no mass. There is no tenderness. There is no rebound and no guarding.  Musculoskeletal: She exhibits no edema or tenderness.  Lymphadenopathy:    She has no cervical adenopathy.  Neurological: She displays normal reflexes. No cranial nerve deficit. She exhibits normal muscle tone. Coordination normal.  Skin: No rash noted. No erythema.  Psychiatric: She has a normal mood and affect. Her behavior is normal. Judgment and thought content normal.    Lab Results  Component Value Date   WBC 5.2 12/05/2013   HGB 13.4 12/05/2013   HCT 39.9 12/05/2013   PLT 265.0 12/05/2013   GLUCOSE 101* 01/07/2015   CHOL 189 01/07/2015   TRIG 131.0 01/07/2015   HDL 56.40 01/07/2015   LDLDIRECT 138.2 10/22/2010   LDLCALC 106* 01/07/2015   ALT 18 01/07/2015   AST 20 01/07/2015   NA 137 01/07/2015   K 4.3 01/07/2015   CL 104 01/07/2015   CREATININE 0.74 01/07/2015   BUN 14 01/07/2015   CO2 29 01/07/2015   TSH 1.60 01/07/2015   HGBA1C 6.1 01/07/2015         Assessment & Plan:

## 2015-01-20 ENCOUNTER — Telehealth: Payer: Self-pay | Admitting: Internal Medicine

## 2015-01-20 NOTE — Telephone Encounter (Signed)
Patient ask if you would call her she need to ask you a question.

## 2015-01-20 NOTE — Telephone Encounter (Signed)
Called pt back she wanted to know what md called the cyst on her hand. It has came back and she was going to [employer]. 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

## 2015-02-24 ENCOUNTER — Ambulatory Visit (INDEPENDENT_AMBULATORY_CARE_PROVIDER_SITE_OTHER): Payer: Medicare Other | Admitting: Gynecology

## 2015-02-24 ENCOUNTER — Encounter: Payer: Self-pay | Admitting: Gynecology

## 2015-02-24 VITALS — BP 124/74 | Ht 59.0 in | Wt 134.0 lb

## 2015-02-24 DIAGNOSIS — Z01419 Encounter for gynecological examination (general) (routine) without abnormal findings: Secondary | ICD-10-CM | POA: Diagnosis not present

## 2015-02-24 DIAGNOSIS — M8580 Other specified disorders of bone density and structure, unspecified site: Secondary | ICD-10-CM

## 2015-02-24 DIAGNOSIS — Z79890 Hormone replacement therapy (postmenopausal): Secondary | ICD-10-CM

## 2015-02-24 DIAGNOSIS — N952 Postmenopausal atrophic vaginitis: Secondary | ICD-10-CM

## 2015-02-24 MED ORDER — ESTRADIOL 0.025 MG/24HR TD PTWK: 0.0250 mg | MEDICATED_PATCH | TRANSDERMAL | Status: DC

## 2015-02-24 NOTE — Patient Instructions (Signed)
Follow up for bone density as scheduled.  You may obtain a copy of any labs that were done today by logging onto MyChart as outlined in the instructions provided with your AVS (after visit summary). The office will not call with normal lab results but certainly if there are any significant abnormalities then we will contact you.   Health Maintenance, Female A healthy lifestyle and preventative care can promote health and wellness.  Maintain regular health, dental, and eye exams.  Eat a healthy diet. Foods like vegetables, fruits, whole grains, low-fat dairy products, and lean protein foods contain the nutrients you need without too many calories. Decrease your intake of foods high in solid fats, added sugars, and salt. Get information about a proper diet from your caregiver, if necessary.  Regular physical exercise is one of the most important things you can do for your health. Most adults should get at least 150 minutes of moderate-intensity exercise (any activity that increases your heart rate and causes you to sweat) each week. In addition, most adults need muscle-strengthening exercises on 2 or more days a week.   Maintain a healthy weight. The body mass index (BMI) is a screening tool to identify possible weight problems. It provides an estimate of body fat based on height and weight. Your caregiver can help determine your BMI, and can help you achieve or maintain a healthy weight. For adults 20 years and older:  A BMI below 18.5 is considered underweight.  A BMI of 18.5 to 24.9 is normal.  A BMI of 25 to 29.9 is considered overweight.  A BMI of 30 and above is considered obese.  Maintain normal blood lipids and cholesterol by exercising and minimizing your intake of saturated fat. Eat a balanced diet with plenty of fruits and vegetables. Blood tests for lipids and cholesterol should begin at age 20 and be repeated every 5 years. If your lipid or cholesterol levels are high, you are over  50, or you are a high risk for heart disease, you may need your cholesterol levels checked more frequently.Ongoing high lipid and cholesterol levels should be treated with medicines if diet and exercise are not effective.  If you smoke, find out from your caregiver how to quit. If you do not use tobacco, do not start.  Lung cancer screening is recommended for adults aged 55 80 years who are at high risk for developing lung cancer because of a history of smoking. Yearly low-dose computed tomography (CT) is recommended for people who have at least a 30-pack-year history of smoking and are a current smoker or have quit within the past 15 years. A pack year of smoking is smoking an average of 1 pack of cigarettes a day for 1 year (for example: 1 pack a day for 30 years or 2 packs a day for 15 years). Yearly screening should continue until the smoker has stopped smoking for at least 15 years. Yearly screening should also be stopped for people who develop a health problem that would prevent them from having lung cancer treatment.  If you are pregnant, do not drink alcohol. If you are breastfeeding, be very cautious about drinking alcohol. If you are not pregnant and choose to drink alcohol, do not exceed 1 drink per day. One drink is considered to be 12 ounces (355 mL) of beer, 5 ounces (148 mL) of wine, or 1.5 ounces (44 mL) of liquor.  Avoid use of street drugs. Do not share needles with anyone. Ask for help   if you need support or instructions about stopping the use of drugs.  High blood pressure causes heart disease and increases the risk of stroke. Blood pressure should be checked at least every 1 to 2 years. Ongoing high blood pressure should be treated with medicines, if weight loss and exercise are not effective.  If you are 55 to 68 years old, ask your caregiver if you should take aspirin to prevent strokes.  Diabetes screening involves taking a blood sample to check your fasting blood sugar level.  This should be done once every 3 years, after age 45, if you are within normal weight and without risk factors for diabetes. Testing should be considered at a younger age or be carried out more frequently if you are overweight and have at least 1 risk factor for diabetes.  Breast cancer screening is essential preventative care for women. You should practice "breast self-awareness." This means understanding the normal appearance and feel of your breasts and may include breast self-examination. Any changes detected, no matter how small, should be reported to a caregiver. Women in their 20s and 30s should have a clinical breast exam (CBE) by a caregiver as part of a regular health exam every 1 to 3 years. After age 40, women should have a CBE every year. Starting at age 40, women should consider having a mammogram (breast X-ray) every year. Women who have a family history of breast cancer should talk to their caregiver about genetic screening. Women at a high risk of breast cancer should talk to their caregiver about having an MRI and a mammogram every year.  Breast cancer gene (BRCA)-related cancer risk assessment is recommended for women who have family members with BRCA-related cancers. BRCA-related cancers include breast, ovarian, tubal, and peritoneal cancers. Having family members with these cancers may be associated with an increased risk for harmful changes (mutations) in the breast cancer genes BRCA1 and BRCA2. Results of the assessment will determine the need for genetic counseling and BRCA1 and BRCA2 testing.  The Pap test is a screening test for cervical cancer. Women should have a Pap test starting at age 21. Between ages 21 and 29, Pap tests should be repeated every 2 years. Beginning at age 30, you should have a Pap test every 3 years as long as the past 3 Pap tests have been normal. If you had a hysterectomy for a problem that was not cancer or a condition that could lead to cancer, then you no  longer need Pap tests. If you are between ages 65 and 70, and you have had normal Pap tests going back 10 years, you no longer need Pap tests. If you have had past treatment for cervical cancer or a condition that could lead to cancer, you need Pap tests and screening for cancer for at least 20 years after your treatment. If Pap tests have been discontinued, risk factors (such as a new sexual partner) need to be reassessed to determine if screening should be resumed. Some women have medical problems that increase the chance of getting cervical cancer. In these cases, your caregiver may recommend more frequent screening and Pap tests.  The human papillomavirus (HPV) test is an additional test that may be used for cervical cancer screening. The HPV test looks for the virus that can cause the cell changes on the cervix. The cells collected during the Pap test can be tested for HPV. The HPV test could be used to screen women aged 30 years and older, and   should be used in women of any age who have unclear Pap test results. After the age of 30, women should have HPV testing at the same frequency as a Pap test.  Colorectal cancer can be detected and often prevented. Most routine colorectal cancer screening begins at the age of 50 and continues through age 75. However, your caregiver may recommend screening at an earlier age if you have risk factors for colon cancer. On a yearly basis, your caregiver may provide home test kits to check for hidden blood in the stool. Use of a small camera at the end of a tube, to directly examine the colon (sigmoidoscopy or colonoscopy), can detect the earliest forms of colorectal cancer. Talk to your caregiver about this at age 50, when routine screening begins. Direct examination of the colon should be repeated every 5 to 10 years through age 75, unless early forms of pre-cancerous polyps or small growths are found.  Hepatitis C blood testing is recommended for all people born from  1945 through 1965 and any individual with known risks for hepatitis C.  Practice safe sex. Use condoms and avoid high-risk sexual practices to reduce the spread of sexually transmitted infections (STIs). Sexually active women aged 25 and younger should be checked for Chlamydia, which is a common sexually transmitted infection. Older women with new or multiple partners should also be tested for Chlamydia. Testing for other STIs is recommended if you are sexually active and at increased risk.  Osteoporosis is a disease in which the bones lose minerals and strength with aging. This can result in serious bone fractures. The risk of osteoporosis can be identified using a bone density scan. Women ages 65 and over and women at risk for fractures or osteoporosis should discuss screening with their caregivers. Ask your caregiver whether you should be taking a calcium supplement or vitamin D to reduce the rate of osteoporosis.  Menopause can be associated with physical symptoms and risks. Hormone replacement therapy is available to decrease symptoms and risks. You should talk to your caregiver about whether hormone replacement therapy is right for you.  Use sunscreen. Apply sunscreen liberally and repeatedly throughout the day. You should seek shade when your shadow is shorter than you. Protect yourself by wearing long sleeves, pants, a wide-brimmed hat, and sunglasses year round, whenever you are outdoors.  Notify your caregiver of new moles or changes in moles, especially if there is a change in shape or color. Also notify your caregiver if a mole is larger than the size of a pencil eraser.  Stay current with your immunizations. Document Released: 02/28/2011 Document Revised: 12/10/2012 Document Reviewed: 02/28/2011 [employer] Patient Information 2014 [employer].   

## 2015-02-24 NOTE — Progress Notes (Signed)
Sharon Paul [birth year] [id number]        69 y.o.  [id number] for breast and pelvic exam. Several issues noted below.  Past medical history,surgical history, problem list, medications, allergies, family history and social history were all reviewed and documented as reviewed in the EPIC chart.  ROS:  Performed with pertinent positives and negatives included in the history, assessment and plan.   Additional significant findings :  none   Exam: Kim Counsellor Vitals:   02/24/15 1525  BP: 124/74  Height: 4\' 11"  (1.499 m)  Weight: 134 lb (60.782 kg)   General appearance:  Normal affect, orientation and appearance. Skin: Grossly normal HEENT: Without gross lesions.  No cervical or supraclavicular adenopathy. Thyroid normal.  Lungs:  Clear without wheezing, rales or rhonchi Cardiac: RR, without RMG Abdominal:  Soft, nontender, without masses, guarding, rebound, organomegaly or hernia Breasts:  Examined lying and sitting without masses, retractions, discharge or axillary adenopathy. Pelvic:  Ext/BUS/vagina with atrophic changes  Adnexa  Without masses or tenderness    Anus and perineum  Normal   Rectovaginal  Normal sphincter tone without palpated masses or tenderness.    Assessment/Plan:  69 y.o. [id number] female for breast and pelvic exam.   1. 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  Patient continues on Climara 0.025 mg patch that she cuts in half weekly. Says she tries to stop it but starts having hot flashes not feeling as well. I again reviewed the whole issue of ERT with advancing age and increased risks of stroke heart attack DVT and possible breast cancer. Patient wants to continue understanding and accepting the risks. Refill 1 year provided.  Patient is having some issues with vaginal dryness. Options to include OTC lubricants, oils such as Coconut oil, vaginal estrogen or increasing her patch strength discussed. Patient not  interested in additional estrogen. She is going to try the oil products as she already tried OTC lubricants without great success. 2. Osteopenia.  DEXA 2012 T score -1.6 FRAX 14%/1.2%. Repeat DEXA now and patient agrees to schedule. Increased calcium vitamin D reviewed. 3. Mammography 03/2014. Repeat mammogram this coming August. SBE monthly reviewed. 4. Pap smear 2012. No Pap smear done today. No history of abnormal Pap smears previously. We have discussed current screening guidelines. She is over the age of 72 and status post hysterectomy for benign indications. Patient prefers to stop screening. 5. Colonoscopy 2014. Repeat at their recommended interval. 6. Health maintenance. No routine blood work done as this is done at her primary physician's office. Follow up in one year, sooner as needed.   Anastasio Auerbach MD, 3:59 PM 02/24/2015

## 2015-02-25 LAB — URINALYSIS W MICROSCOPIC + REFLEX CULTURE
BACTERIA UA: NONE SEEN
Bilirubin Urine: NEGATIVE
Casts: NONE SEEN
Crystals: NONE SEEN
GLUCOSE, UA: NEGATIVE mg/dL
Hgb urine dipstick: NEGATIVE
KETONES UR: NEGATIVE mg/dL
Leukocytes, UA: NEGATIVE
Nitrite: NEGATIVE
PH: 6 (ref 5.0–8.0)
Protein, ur: NEGATIVE mg/dL
Specific Gravity, Urine: 1.009 (ref 1.005–1.030)
Squamous Epithelial / LPF: NONE SEEN
Urobilinogen, UA: 0.2 mg/dL (ref 0.0–1.0)

## 2015-03-30 DIAGNOSIS — M8580 Other specified disorders of bone density and structure, unspecified site: Secondary | ICD-10-CM

## 2015-03-30 HISTORY — DX: Other specified disorders of bone density and structure, unspecified site: M85.80

## 2015-04-07 ENCOUNTER — Ambulatory Visit (INDEPENDENT_AMBULATORY_CARE_PROVIDER_SITE_OTHER): Payer: Medicare Other

## 2015-04-07 ENCOUNTER — Other Ambulatory Visit: Payer: Self-pay | Admitting: Gynecology

## 2015-04-07 ENCOUNTER — Encounter: Payer: Self-pay | Admitting: Gynecology

## 2015-04-07 DIAGNOSIS — Z13820 Encounter for screening for osteoporosis: Secondary | ICD-10-CM | POA: Diagnosis not present

## 2015-04-07 DIAGNOSIS — M8580 Other specified disorders of bone density and structure, unspecified site: Secondary | ICD-10-CM

## 2015-04-10 ENCOUNTER — Telehealth: Payer: Self-pay | Admitting: *Deleted

## 2015-04-10 NOTE — Telephone Encounter (Signed)
Left msg on triage requesting refill on hyoscyamine .125 mg. Medication is not on med list pls advise...Johny Chess

## 2015-04-13 MED ORDER — HYOSCYAMINE SULFATE 0.125 MG PO TABS: 0.1250 mg | ORAL_TABLET | ORAL | Status: DC | PRN

## 2015-04-13 NOTE — Telephone Encounter (Signed)
Done. Thx.

## 2015-04-14 NOTE — Telephone Encounter (Signed)
Called pt no answer LMOM md ok rx sent to [facility]...Johny Chess

## 2015-04-28 DIAGNOSIS — Z1231 Encounter for screening mammogram for malignant neoplasm of breast: Secondary | ICD-10-CM | POA: Diagnosis not present

## 2015-04-29 ENCOUNTER — Encounter: Payer: Self-pay | Admitting: Gynecology

## 2015-05-01 ENCOUNTER — Telehealth: Payer: Self-pay | Admitting: *Deleted

## 2015-05-01 MED ORDER — HYOSCYAMINE SULFATE 0.125 MG PO TABS: 0.1250 mg | ORAL_TABLET | ORAL | Status: DC | PRN

## 2015-05-01 NOTE — Telephone Encounter (Signed)
Pt states she is having a time with [facility] refilling her hycosamine. Pt is wanting rx sent to [facility] instead for 30 day supply. Inform pt will send to pharmacy...Johny Chess

## 2015-05-05 ENCOUNTER — Ambulatory Visit: Payer: Medicare Other | Admitting: Internal Medicine

## 2015-05-11 ENCOUNTER — Telehealth: Payer: Self-pay

## 2015-05-11 NOTE — Telephone Encounter (Signed)
Patient called because she received a letter from [facility] regarding her mammogram. She was concerned because it told her she had dense breasts that might obscure some small masses.  It mentioned something about consulting with your physician.  It also mentioned that dense breasts can change due to HRT, weight gain, etc.  That made her ask if she should stop her HRT?   (She did ask if she should make appointment to come in and talk with you about it. I told her I would ask you.)  I explained that dense breasts are common. Usually 3-D is recommended for dense breast tissue and important to follow yearly with screening mammo.  I told her I would ask you if anything else you recommend.

## 2015-05-11 NOTE — Telephone Encounter (Signed)
Patient called in voice mail stating she would like to speak with nurse to "get her opinion".

## 2015-05-12 NOTE — Telephone Encounter (Signed)
Non-urgent office visit to discuss

## 2015-05-12 NOTE — Telephone Encounter (Signed)
Patient informed. She was at work and could not schedule at the moment but said she will call back as soon as she is free and schedule.

## 2015-05-14 ENCOUNTER — Other Ambulatory Visit: Payer: Self-pay | Admitting: Gynecology

## 2015-05-19 DIAGNOSIS — M5126 Other intervertebral disc displacement, lumbar region: Secondary | ICD-10-CM | POA: Diagnosis not present

## 2015-05-27 ENCOUNTER — Encounter: Payer: Self-pay | Admitting: Gynecology

## 2015-05-27 ENCOUNTER — Ambulatory Visit (INDEPENDENT_AMBULATORY_CARE_PROVIDER_SITE_OTHER): Payer: Medicare Other | Admitting: Gynecology

## 2015-05-27 VITALS — BP 120/74

## 2015-05-27 DIAGNOSIS — M8580 Other specified disorders of bone density and structure, unspecified site: Secondary | ICD-10-CM | POA: Diagnosis not present

## 2015-05-27 DIAGNOSIS — Z79890 Hormone replacement therapy (postmenopausal): Secondary | ICD-10-CM

## 2015-05-27 DIAGNOSIS — M859 Disorder of bone density and structure, unspecified: Secondary | ICD-10-CM | POA: Diagnosis not present

## 2015-05-27 NOTE — Progress Notes (Signed)
Sharon Paul 02-19-1946 [id number]        69 y.o.  [id number] Presents to discuss her most recent mammogram which was normal and showed a category C. She's never known about the breast density reporting before and was concerned about this. She is on estrogen patch and wanted to discuss this in conjunction with her breast density.  She also asked for refill of her vitamin D which she is taking 50,000 units weekly and has been doing so for apparently several years. No recent vitamin D levels in her chart.  Past medical history,surgical history, problem list, medications, allergies, family history and social history were all reviewed and documented in the EPIC chart.  Directed ROS with pertinent positives and negatives documented in the history of present illness/assessment and plan.  Exam: Filed Vitals:   05/27/15 1038  BP: 120/74   General appearance:  Normal   Assessment/Plan:  69 y.o. [id number] with above history. I reviewed her mammogram report which was normal and category C breast density. I reviewed the breast density scoring system. I discussed that ERT can make her breasts appear denser on mammography. Whether denser breasts increase the risk of breast cancer discussed. Also possibly making reading the mammogram a little more difficult. The whole issue of ERT again was discussed to include the WHI study and risks to include stroke heart attack DVT and breast cancer. Patient finds it when she does not take her patch she has significant hot flashes night sweats and insomnia. She strongly wants to continue the patch from a quality of life standpoint. She is comfortable doing so after our discussion again recognizing breast cancers most common cancer in women and that if she does develop this it may not be associated with her ERT although that possibility exists. Patient will also get a vitamin D level today and then we'll make a decision as far as supplementation.    Anastasio Auerbach  MD, 11:25 AM 05/27/2015

## 2015-05-27 NOTE — Patient Instructions (Signed)
Follow up when you're due for your annual exam. 

## 2015-05-28 LAB — VITAMIN D 25 HYDROXY (VIT D DEFICIENCY, FRACTURES): VIT D 25 HYDROXY: 31 ng/mL (ref 30–100)

## 2015-06-02 ENCOUNTER — Encounter: Payer: Self-pay | Admitting: Internal Medicine

## 2015-06-02 ENCOUNTER — Ambulatory Visit (INDEPENDENT_AMBULATORY_CARE_PROVIDER_SITE_OTHER): Payer: Medicare Other | Admitting: Internal Medicine

## 2015-06-02 VITALS — BP 120/80 | HR 61 | Wt 136.0 lb

## 2015-06-02 DIAGNOSIS — Z23 Encounter for immunization: Secondary | ICD-10-CM

## 2015-06-02 DIAGNOSIS — I10 Essential (primary) hypertension: Secondary | ICD-10-CM | POA: Diagnosis not present

## 2015-06-02 DIAGNOSIS — R739 Hyperglycemia, unspecified: Secondary | ICD-10-CM

## 2015-06-02 DIAGNOSIS — E785 Hyperlipidemia, unspecified: Secondary | ICD-10-CM

## 2015-06-02 DIAGNOSIS — F411 Generalized anxiety disorder: Secondary | ICD-10-CM

## 2015-06-02 DIAGNOSIS — E559 Vitamin D deficiency, unspecified: Secondary | ICD-10-CM

## 2015-06-02 MED ORDER — ROSUVASTATIN CALCIUM 5 MG PO TABS: 5.0000 mg | ORAL_TABLET | Freq: Every day | ORAL | Status: DC

## 2015-06-02 NOTE — Assessment & Plan Note (Signed)
Labetalol, Irbesartan 

## 2015-06-02 NOTE — Assessment & Plan Note (Signed)
On Crestor 

## 2015-06-02 NOTE — Progress Notes (Signed)
Subjective:  Patient ID: [name], female    DOB: Jan 09, 1946  Age: 69 y.o. MRN: [id number]  CC: No chief complaint on file.   HPI Megin Consalvo presents for HTN, dyslipidemia, IBS f/u.  Outpatient Prescriptions Prior to Visit  Medication Sig Dispense Refill  . aspirin 81 MG tablet Take 81 mg by mouth daily.    [name] estradiol (CLIMARA - DOSED IN MG/24 HR) 0.025 mg/24hr patch Place 1 patch (0.025 mg total) onto the skin once a week. 12 patch 4  . FLUoxetine (PROZAC) 10 MG capsule Take 1 capsule by mouth  daily 90 capsule 2  . fluticasone (FLONASE) 50 MCG/ACT nasal spray PLACE 2 SPRAYS INTO THE NOSE DAILY. 16 g 2  . guaiFENesin (MUCINEX) 600 MG 12 hr tablet Take 1 tablet (600 mg total) by mouth 2 (two) times daily. 30 tablet 0  . hyoscyamine (LEVSIN, ANASPAZ) 0.125 MG tablet Take 1 tablet (0.125 mg total) by mouth every 4 (four) hours as needed. 30 tablet 3  . ibuprofen (ADVIL,MOTRIN) 600 MG tablet TAKE 1 TABLET (600 MG TOTAL) BY MOUTH EVERY 8 (EIGHT) HOURS AS NEEDED FOR PAIN. 90 tablet 3  . irbesartan (AVAPRO) 300 MG tablet Take 0.5 tablets (150 mg total) by mouth 2 (two) times daily. 30 tablet 3  . labetalol (NORMODYNE) 200 MG tablet Take 1 tablet (200 mg total) by mouth 2 (two) times daily. 180 tablet 2  . LORazepam (ATIVAN) 0.5 MG tablet Take 1 tablet (0.5 mg total) by mouth 2 (two) times daily. 60 tablet 2  . omeprazole (PRILOSEC) 40 MG capsule Take 1 capsule (40 mg  total) by mouth daily. 90 capsule 3  . Pancrelipase, Lip-Prot-Amyl, 24000 UNITS CPEP 1 po tid ac or w/meal prn 90 capsule 0  . Probiotic Product (ALIGN) 4 MG CAPS Take 1 capsule by mouth daily. 30 capsule 6  . valACYclovir (VALTREX) 500 MG tablet Take twice daily for 3-5 days 30 tablet 6  . Vitamin D, Ergocalciferol, (DRISDOL) 50000 UNITS CAPS capsule Take 1 capsule by mouth  every 7 days. 11 capsule 2  . CRESTOR 5 MG tablet Take 1 tablet (5 mg total)  by mouth daily. 90 tablet 3  . rosuvastatin  (CRESTOR) 20 MG tablet Take 1 tablet (20 mg total) by mouth daily. 90 tablet 3   No facility-administered medications prior to visit.    ROS Review of Systems  Constitutional: Negative for chills, activity change, appetite change, fatigue and unexpected weight change.  HENT: Negative for congestion, mouth sores and sinus pressure.   Eyes: Negative for visual disturbance.  Respiratory: Negative for cough and chest tightness.   Gastrointestinal: Negative for nausea and abdominal pain.  Genitourinary: Negative for frequency, difficulty urinating and vaginal pain.  Musculoskeletal: Positive for arthralgias. Negative for back pain and gait problem.  Skin: Negative for pallor and rash.  Neurological: Negative for dizziness, tremors, weakness, numbness and headaches.  Psychiatric/Behavioral: Negative for confusion and sleep disturbance.    Objective:  BP 120/80 mmHg  Pulse 61  Wt 136 lb (61.689 kg)  SpO2 97%  BP Readings from Last 3 Encounters:  06/02/15 120/80  05/27/15 120/74  02/24/15 124/74    Wt Readings from Last 3 Encounters:  06/02/15 136 lb (61.689 kg)  02/24/15 134 lb (60.782 kg)  01/14/15 133 lb (60.328 kg)    Physical Exam  Constitutional: She appears well-developed. No distress.  HENT:  Head: Normocephalic.  Right Ear: External ear normal.  Left Ear: External ear normal.  Nose:  Nose normal.  Mouth/Throat: Oropharynx is clear and moist.  Eyes: Conjunctivae are normal. Pupils are equal, round, and reactive to light. Right eye exhibits no discharge. Left eye exhibits no discharge.  Neck: Normal range of motion. Neck supple. No JVD present. No tracheal deviation present. No thyromegaly present.  Cardiovascular: Normal rate, regular rhythm and normal heart sounds.   Pulmonary/Chest: No stridor. No respiratory distress. She has no wheezes.  Abdominal: Soft. Bowel sounds are normal. She exhibits no distension and no mass. There is no tenderness. There is no rebound and  no guarding.  Musculoskeletal: She exhibits no edema or tenderness.  Lymphadenopathy:    She has no cervical adenopathy.  Neurological: She displays normal reflexes. No cranial nerve deficit. She exhibits normal muscle tone. Coordination normal.  Skin: No rash noted. No erythema.  Psychiatric: She has a normal mood and affect. Her behavior is normal. Judgment and thought content normal.    Lab Results  Component Value Date   WBC 5.2 12/05/2013   HGB 13.4 12/05/2013   HCT 39.9 12/05/2013   PLT 265.0 12/05/2013   GLUCOSE 101* 01/07/2015   CHOL 189 01/07/2015   TRIG 131.0 01/07/2015   HDL 56.40 01/07/2015   LDLDIRECT 138.2 10/22/2010   LDLCALC 106* 01/07/2015   ALT 18 01/07/2015   AST 20 01/07/2015   NA 137 01/07/2015   K 4.3 01/07/2015   CL 104 01/07/2015   CREATININE 0.74 01/07/2015   BUN 14 01/07/2015   CO2 29 01/07/2015   TSH 1.60 01/07/2015   HGBA1C 6.1 01/07/2015    Dg Chest 2 View  03/02/2012   *RADIOLOGY REPORT*  Clinical Data: Cough for 4 days, congestion  CHEST - 2 VIEW  Comparison: Chest x-ray of 04/15/2007  Findings: No active infiltrate or effusion is seen.  Mediastinal contours are stable. There do appear to be calcified mediastinal nodes present most likely due to prior granulomatous disease. Thoracolumbar scoliosis appears stable.  IMPRESSION: No active lung disease.  Stable thoracolumbar scoliosis.  Original Report Authenticated By: Joretta Bachelor, M.D.   Assessment & Plan:   Diagnoses and all orders for this visit:  Need for influenza vaccination -     Flu Vaccine QUAD 36+ mos IM  Vitamin D deficiency  Dyslipidemia  Other orders -     rosuvastatin (CRESTOR) 5 MG tablet; Take 1 tablet (5 mg total) by mouth daily. -     POCT glucose (manual entry)   I have discontinued [name]'s rosuvastatin and CRESTOR. I am also having her start on rosuvastatin. Additionally, I am having her maintain her ALIGN, fluticasone, aspirin, Vitamin D  (Ergocalciferol), Pancrelipase (Lip-Prot-Amyl), guaiFENesin, valACYclovir, labetalol, ibuprofen, LORazepam, omeprazole, FLUoxetine, irbesartan, estradiol, hyoscyamine, and Cholecalciferol.  Meds ordered this encounter  Medications  . Cholecalciferol 2000 UNITS TABS    Sig: Take 1 tablet by mouth daily.  . rosuvastatin (CRESTOR) 5 MG tablet    Sig: Take 1 tablet (5 mg total) by mouth daily.    Dispense:  100 tablet    Refill:  3     Follow-up: Return in about 4 months (around 10/03/2015) for a follow-up visit.  Walker Kehr, MD

## 2015-06-02 NOTE — Assessment & Plan Note (Signed)
Chronic. 

## 2015-06-02 NOTE — Patient Instructions (Signed)
MegaRed Krill oil

## 2015-06-02 NOTE — Assessment & Plan Note (Signed)
Prozac,  Lorazepam prn  Potential benefits of a long term benzodiazepines  use as well as potential risks  and complications were explained to the patient and were aknowledged. 

## 2015-06-02 NOTE — Assessment & Plan Note (Signed)
CBG today - 87

## 2015-06-02 NOTE — Progress Notes (Signed)
Pre visit review using our clinic review tool, if applicable. No additional management support is needed unless otherwise documented below in the visit note. 

## 2015-07-08 ENCOUNTER — Other Ambulatory Visit: Payer: Self-pay | Admitting: Internal Medicine

## 2015-08-26 ENCOUNTER — Other Ambulatory Visit: Payer: Self-pay | Admitting: Internal Medicine

## 2015-08-28 ENCOUNTER — Telehealth: Payer: Self-pay | Admitting: *Deleted

## 2015-08-28 MED ORDER — IRBESARTAN 300 MG PO TABS: 150.0000 mg | ORAL_TABLET | Freq: Two times a day (BID) | ORAL | Status: DC

## 2015-08-28 NOTE — Telephone Encounter (Signed)
Pt left msg on triage stating needing to pick up rx for BP med Avapro. She get med from [place]. Called pt back no answer LMOM rx ready for pick-up...Johny Chess

## 2015-09-01 MED ORDER — IRBESARTAN 300 MG PO TABS: 150.0000 mg | ORAL_TABLET | Freq: Two times a day (BID) | ORAL | Status: DC

## 2015-09-01 NOTE — Addendum Note (Signed)
Addended by: Earnstine Regal on: 09/01/2015 04:03 PM   Modules accepted: Orders

## 2015-09-25 DIAGNOSIS — R2231 Localized swelling, mass and lump, right upper limb: Secondary | ICD-10-CM | POA: Diagnosis not present

## 2015-09-25 DIAGNOSIS — M79641 Pain in right hand: Secondary | ICD-10-CM | POA: Diagnosis not present

## 2015-09-25 DIAGNOSIS — S60221A Contusion of right hand, initial encounter: Secondary | ICD-10-CM | POA: Diagnosis not present

## 2015-09-30 ENCOUNTER — Other Ambulatory Visit (INDEPENDENT_AMBULATORY_CARE_PROVIDER_SITE_OTHER): Payer: Medicare Other

## 2015-09-30 DIAGNOSIS — E785 Hyperlipidemia, unspecified: Secondary | ICD-10-CM

## 2015-09-30 DIAGNOSIS — E559 Vitamin D deficiency, unspecified: Secondary | ICD-10-CM

## 2015-09-30 DIAGNOSIS — R739 Hyperglycemia, unspecified: Secondary | ICD-10-CM | POA: Diagnosis not present

## 2015-09-30 DIAGNOSIS — I10 Essential (primary) hypertension: Secondary | ICD-10-CM | POA: Diagnosis not present

## 2015-09-30 DIAGNOSIS — F411 Generalized anxiety disorder: Secondary | ICD-10-CM

## 2015-09-30 LAB — BASIC METABOLIC PANEL
BUN: 15 mg/dL (ref 6–23)
CHLORIDE: 105 meq/L (ref 96–112)
CO2: 25 meq/L (ref 19–32)
CREATININE: 0.85 mg/dL (ref 0.40–1.20)
Calcium: 9.6 mg/dL (ref 8.4–10.5)
GFR: 70.49 mL/min (ref >60.00)
Glucose, Bld: 105 mg/dL — ABNORMAL HIGH (ref 70–99)
Potassium: 4.4 mEq/L (ref 3.5–5.1)
Sodium: 140 mEq/L (ref 135–145)

## 2015-09-30 LAB — HEPATIC FUNCTION PANEL
ALT: 18 U/L (ref 0–35)
AST: 17 U/L (ref 0–37)
Albumin: 4.4 g/dL (ref 3.5–5.2)
Alkaline Phosphatase: 82 U/L (ref 39–117)
BILIRUBIN DIRECT: 0.1 mg/dL (ref 0.0–0.3)
BILIRUBIN TOTAL: 0.4 mg/dL (ref 0.2–1.2)
Total Protein: 7.1 g/dL (ref 6.0–8.3)

## 2015-09-30 LAB — URINALYSIS
BILIRUBIN URINE: NEGATIVE
HGB URINE DIPSTICK: NEGATIVE
Ketones, ur: NEGATIVE
Leukocytes, UA: NEGATIVE
NITRITE: NEGATIVE
Specific Gravity, Urine: 1.02 (ref 1.000–1.030)
TOTAL PROTEIN, URINE-UPE24: NEGATIVE
Urine Glucose: NEGATIVE
Urobilinogen, UA: 0.2 (ref 0.0–1.0)
pH: 6.5 (ref 5.0–8.0)

## 2015-09-30 LAB — LIPID PANEL
CHOL/HDL RATIO: 3
CHOLESTEROL: 174 mg/dL (ref 0–200)
HDL: 56.4 mg/dL (ref >39.00)
LDL CALC: 94 mg/dL (ref 0–99)
NonHDL: 117.47
TRIGLYCERIDES: 118 mg/dL (ref 0.0–149.0)
VLDL: 23.6 mg/dL (ref 0.0–40.0)

## 2015-09-30 LAB — CBC WITH DIFFERENTIAL/PLATELET
BASOS ABS: 0 10*3/uL (ref 0.0–0.1)
BASOS PCT: 0.8 % (ref 0.0–3.0)
EOS ABS: 0.1 10*3/uL (ref 0.0–0.7)
Eosinophils Relative: 2.9 % (ref 0.0–5.0)
HCT: 37.7 % (ref 36.0–46.0)
HEMOGLOBIN: 12.5 g/dL (ref 12.0–15.0)
Lymphocytes Relative: 39.9 % (ref 12.0–46.0)
Lymphs Abs: 1.7 10*3/uL (ref 0.7–4.0)
MCHC: 33.2 g/dL (ref 30.0–36.0)
MCV: 88.7 fl (ref 78.0–100.0)
MONO ABS: 0.5 10*3/uL (ref 0.1–1.0)
Monocytes Relative: 11.2 % (ref 3.0–12.0)
NEUTROS ABS: 1.9 10*3/uL (ref 1.4–7.7)
Neutrophils Relative %: 45.2 % (ref 43.0–77.0)
PLATELETS: 264 10*3/uL (ref 150.0–400.0)
RBC: 4.26 Mil/uL (ref 3.87–5.11)
RDW: 13.1 % (ref 11.5–15.5)
WBC: 4.2 10*3/uL (ref 4.0–10.5)

## 2015-09-30 LAB — TSH: TSH: 1.4 u[IU]/mL (ref 0.35–4.50)

## 2015-09-30 LAB — HEMOGLOBIN A1C: HEMOGLOBIN A1C: 6.1 % (ref 4.6–6.5)

## 2015-10-05 ENCOUNTER — Telehealth: Payer: Self-pay | Admitting: *Deleted

## 2015-10-05 NOTE — Telephone Encounter (Signed)
Pt called stating her estradiol 0.025 mg patch has been D/C'd and what to do next. I called [facility] to confirm this and was informed patch are still there and not D/C'd i called pt to relay and she is going to order patches.

## 2015-10-06 ENCOUNTER — Ambulatory Visit (INDEPENDENT_AMBULATORY_CARE_PROVIDER_SITE_OTHER): Payer: Medicare Other | Admitting: Internal Medicine

## 2015-10-06 ENCOUNTER — Encounter: Payer: Self-pay | Admitting: Internal Medicine

## 2015-10-06 VITALS — BP 160/82 | HR 66 | Temp 98.2°F | Wt 134.0 lb

## 2015-10-06 DIAGNOSIS — F411 Generalized anxiety disorder: Secondary | ICD-10-CM

## 2015-10-06 DIAGNOSIS — J209 Acute bronchitis, unspecified: Secondary | ICD-10-CM | POA: Insufficient documentation

## 2015-10-06 DIAGNOSIS — I739 Peripheral vascular disease, unspecified: Secondary | ICD-10-CM

## 2015-10-06 DIAGNOSIS — E785 Hyperlipidemia, unspecified: Secondary | ICD-10-CM

## 2015-10-06 DIAGNOSIS — Z23 Encounter for immunization: Secondary | ICD-10-CM | POA: Diagnosis not present

## 2015-10-06 DIAGNOSIS — I10 Essential (primary) hypertension: Secondary | ICD-10-CM

## 2015-10-06 DIAGNOSIS — J3081 Allergic rhinitis due to animal (cat) (dog) hair and dander: Secondary | ICD-10-CM

## 2015-10-06 DIAGNOSIS — J208 Acute bronchitis due to other specified organisms: Secondary | ICD-10-CM

## 2015-10-06 MED ORDER — MONTELUKAST SODIUM 10 MG PO TABS: 10.0000 mg | ORAL_TABLET | Freq: Every day | ORAL | Status: DC

## 2015-10-06 MED ORDER — PROMETHAZINE-CODEINE 6.25-10 MG/5ML PO SYRP: 5.0000 mL | ORAL_SOLUTION | ORAL | Status: DC | PRN

## 2015-10-06 MED ORDER — HYDROCHLOROTHIAZIDE 12.5 MG PO CAPS: 12.5000 mg | ORAL_CAPSULE | Freq: Every day | ORAL | Status: DC

## 2015-10-06 MED ORDER — BENZONATATE 100 MG PO CAPS: 100.0000 mg | ORAL_CAPSULE | Freq: Two times a day (BID) | ORAL | Status: DC | PRN

## 2015-10-06 MED ORDER — LABETALOL HCL 300 MG PO TABS: 300.0000 mg | ORAL_TABLET | Freq: Two times a day (BID) | ORAL | Status: DC

## 2015-10-06 MED ORDER — LORAZEPAM 0.5 MG PO TABS: 0.5000 mg | ORAL_TABLET | Freq: Two times a day (BID) | ORAL | Status: DC

## 2015-10-06 NOTE — Assessment & Plan Note (Signed)
Prom-cod syr Tessalon prn RTC if worse

## 2015-10-06 NOTE — Assessment & Plan Note (Signed)
Labetalol, Irbesartan 2/17 HCTZ added

## 2015-10-06 NOTE — Progress Notes (Signed)
Subjective:  Patient ID: Sharon Paul, female    DOB: [birth year]  Age: 70 y.o. MRN: [id number]  CC: No chief complaint on file.   HPI Sharon Paul presents for HTN, allergies, nasal congestion - long time. C/o cough x 1 week. C/o stress and anxiety  Outpatient Prescriptions Prior to Visit  Medication Sig Dispense Refill  . aspirin 81 MG tablet Take 81 mg by mouth daily.    . Cholecalciferol 2000 UNITS TABS Take 1 tablet by mouth daily.    [name] estradiol (CLIMARA - DOSED IN MG/24 HR) 0.025 mg/24hr patch Place 1 patch (0.025 mg total) onto the skin once a week. 12 patch 4  . FLUoxetine (PROZAC) 10 MG capsule Take 1 capsule by mouth  daily 90 capsule 3  . fluticasone (FLONASE) 50 MCG/ACT nasal spray PLACE 2 SPRAYS INTO THE NOSE DAILY. 16 g 2  . guaiFENesin (MUCINEX) 600 MG 12 hr tablet Take 1 tablet (600 mg total) by mouth 2 (two) times daily. 30 tablet 0  . hyoscyamine (LEVSIN, ANASPAZ) 0.125 MG tablet Take 1 tablet (0.125 mg total) by mouth every 4 (four) hours as needed. 30 tablet 3  . ibuprofen (ADVIL,MOTRIN) 600 MG tablet TAKE 1 TABLET (600 MG TOTAL) BY MOUTH EVERY 8 (EIGHT) HOURS AS NEEDED FOR PAIN. 90 tablet 3  . irbesartan (AVAPRO) 300 MG tablet Take 0.5 tablets (150 mg total) by mouth 2 (two) times daily. 90 tablet 3  . omeprazole (PRILOSEC) 40 MG capsule Take 1 capsule (40 mg  total) by mouth daily. 90 capsule 3  . Pancrelipase, Lip-Prot-Amyl, 24000 UNITS CPEP 1 po tid ac or w/meal prn 90 capsule 0  . Probiotic Product (ALIGN) 4 MG CAPS Take 1 capsule by mouth daily. 30 capsule 6  . rosuvastatin (CRESTOR) 5 MG tablet Take 1 tablet (5 mg total) by mouth daily. 100 tablet 3  . valACYclovir (VALTREX) 500 MG tablet Take twice daily for 3-5 days 30 tablet 6  . Vitamin D, Ergocalciferol, (DRISDOL) 50000 UNITS CAPS capsule Take 1 capsule by mouth  every 7 days. 11 capsule 2  . labetalol (NORMODYNE) 200 MG tablet Take 1 tablet by mouth two  times daily 180 tablet 3  .  LORazepam (ATIVAN) 0.5 MG tablet Take 1 tablet (0.5 mg total) by mouth 2 (two) times daily. 60 tablet 2   No facility-administered medications prior to visit.    ROS Review of Systems  Constitutional: Negative for chills, activity change, appetite change, fatigue and unexpected weight change.  HENT: Positive for congestion and sinus pressure. Negative for mouth sores.   Eyes: Negative for visual disturbance.  Respiratory: Positive for cough. Negative for chest tightness.   Gastrointestinal: Negative for nausea and abdominal pain.  Genitourinary: Negative for frequency, difficulty urinating and vaginal pain.  Musculoskeletal: Negative for back pain and gait problem.  Skin: Negative for pallor and rash.  Neurological: Negative for dizziness, tremors, weakness, numbness and headaches.  Psychiatric/Behavioral: Negative for suicidal ideas, confusion and sleep disturbance. The patient is nervous/anxious.   SBP 160s  Objective:  BP 160/82 mmHg  Pulse 66  Temp(Src) 98.2 F (36.8 C) (Oral)  Wt 134 lb (60.782 kg)  SpO2 98%  BP Readings from Last 3 Encounters:  10/06/15 160/82  06/02/15 120/80  05/27/15 120/74    Wt Readings from Last 3 Encounters:  10/06/15 134 lb (60.782 kg)  06/02/15 136 lb (61.689 kg)  02/24/15 134 lb (60.782 kg)    Physical Exam  Constitutional: She appears well-developed. No distress.  HENT:  Head: Normocephalic.  Right Ear: External ear normal.  Left Ear: External ear normal.  Nose: Nose normal.  Mouth/Throat: Oropharynx is clear and moist.  Eyes: Conjunctivae are normal. Pupils are equal, round, and reactive to light. Right eye exhibits no discharge. Left eye exhibits no discharge.  Neck: Normal range of motion. Neck supple. No JVD present. No tracheal deviation present. No thyromegaly present.  Cardiovascular: Normal rate, regular rhythm and normal heart sounds.   Pulmonary/Chest: No stridor. No respiratory distress. She has no wheezes.  Abdominal:  Soft. Bowel sounds are normal. She exhibits no distension and no mass. There is no tenderness. There is no rebound and no guarding.  Musculoskeletal: She exhibits no edema or tenderness.  Lymphadenopathy:    She has no cervical adenopathy.  Neurological: She displays normal reflexes. No cranial nerve deficit. She exhibits normal muscle tone. Coordination normal.  Skin: No rash noted. No erythema.  Psychiatric: She has a normal mood and affect. Her behavior is normal. Judgment and thought content normal.    Lab Results  Component Value Date   WBC 4.2 09/30/2015   HGB 12.5 09/30/2015   HCT 37.7 09/30/2015   PLT 264.0 09/30/2015   GLUCOSE 105* 09/30/2015   CHOL 174 09/30/2015   TRIG 118.0 09/30/2015   HDL 56.40 09/30/2015   LDLDIRECT 138.2 10/22/2010   LDLCALC 94 09/30/2015   ALT 18 09/30/2015   AST 17 09/30/2015   NA 140 09/30/2015   K 4.4 09/30/2015   CL 105 09/30/2015   CREATININE 0.85 09/30/2015   BUN 15 09/30/2015   CO2 25 09/30/2015   TSH 1.40 09/30/2015   HGBA1C 6.1 09/30/2015    Dg Chest 2 View  03/02/2012  *RADIOLOGY REPORT* Clinical Data: Cough for 4 days, congestion CHEST - 2 VIEW Comparison: Chest x-ray of 04/15/2007 Findings: No active infiltrate or effusion is seen.  Mediastinal contours are stable. There do appear to be calcified mediastinal nodes present most likely due to prior granulomatous disease. Thoracolumbar scoliosis appears stable. IMPRESSION: No active lung disease.  Stable thoracolumbar scoliosis. Original Report Authenticated By: Joretta Bachelor, M.D.   Assessment & Plan:   Diagnoses and all orders for this visit:  Anxiety state -     LORazepam (ATIVAN) 0.5 MG tablet; Take 1 tablet (0.5 mg total) by mouth 2 (two) times daily.  Essential hypertension  Peripheral vascular disease (HCC)  Generalized anxiety disorder  Need for Tdap vaccination -     Tdap vaccine greater than or equal to 7yo IM  Other orders -     labetalol (NORMODYNE) 300 MG  tablet; Take 1 tablet (300 mg total) by mouth 2 (two) times daily. -     hydrochlorothiazide (MICROZIDE) 12.5 MG capsule; Take 1 capsule (12.5 mg total) by mouth daily. -     montelukast (SINGULAIR) 10 MG tablet; Take 1 tablet (10 mg total) by mouth daily. -     promethazine-codeine (PHENERGAN WITH CODEINE) 6.25-10 MG/5ML syrup; Take 5 mLs by mouth every 4 (four) hours as needed. -     benzonatate (TESSALON) 100 MG capsule; Take 1-2 capsules (100-200 mg total) by mouth 2 (two) times daily as needed for cough.  I have discontinued [name]'s labetalol. I am also having her start on labetalol, hydrochlorothiazide, montelukast, promethazine-codeine, and benzonatate. Additionally, I am having her maintain her ALIGN, fluticasone, aspirin, Vitamin D (Ergocalciferol), Pancrelipase (Lip-Prot-Amyl), guaiFENesin, valACYclovir, ibuprofen, omeprazole, estradiol, hyoscyamine, Cholecalciferol, rosuvastatin, FLUoxetine, irbesartan, and LORazepam.  Meds ordered this encounter  Medications  .  LORazepam (ATIVAN) 0.5 MG tablet    Sig: Take 1 tablet (0.5 mg total) by mouth 2 (two) times daily.    Dispense:  60 tablet    Refill:  2  . labetalol (NORMODYNE) 300 MG tablet    Sig: Take 1 tablet (300 mg total) by mouth 2 (two) times daily.    Dispense:  180 tablet    Refill:  3  . hydrochlorothiazide (MICROZIDE) 12.5 MG capsule    Sig: Take 1 capsule (12.5 mg total) by mouth daily.    Dispense:  30 capsule    Refill:  5  . montelukast (SINGULAIR) 10 MG tablet    Sig: Take 1 tablet (10 mg total) by mouth daily.    Dispense:  30 tablet    Refill:  3  . promethazine-codeine (PHENERGAN WITH CODEINE) 6.25-10 MG/5ML syrup    Sig: Take 5 mLs by mouth every 4 (four) hours as needed.    Dispense:  300 mL    Refill:  0  . benzonatate (TESSALON) 100 MG capsule    Sig: Take 1-2 capsules (100-200 mg total) by mouth 2 (two) times daily as needed for cough.    Dispense:  20 capsule    Refill:  0      Follow-up: Return in about 3 months (around 01/03/2016) for a follow-up visit.  Walker Kehr, MD

## 2015-10-06 NOTE — Assessment & Plan Note (Signed)
Added Singulair 

## 2015-10-06 NOTE — Progress Notes (Signed)
Pre visit review using our clinic review tool, if applicable. No additional management support is needed unless otherwise documented below in the visit note. 

## 2015-10-06 NOTE — Assessment & Plan Note (Signed)
Better on Crestor! 

## 2015-10-06 NOTE — Assessment & Plan Note (Signed)
Lorazepam prn  Potential benefits of a long term benzodiazepines  use as well as potential risks  and complications were explained to the patient and were aknowledged.  

## 2015-10-06 NOTE — Assessment & Plan Note (Signed)
On BP meds ASA

## 2015-10-07 ENCOUNTER — Telehealth: Payer: Self-pay | Admitting: Internal Medicine

## 2015-10-07 NOTE — Telephone Encounter (Signed)
pls take Tylenol, Benadryl as needed Thx

## 2015-10-07 NOTE — Telephone Encounter (Signed)
Pt called in said that she has Chills, fever, feel flu like.  Chest is tight, is this normal after the shot she had yesterday.  She said that she will so much worse today   Best number is [phone]

## 2015-10-08 NOTE — Telephone Encounter (Signed)
Pt advised in detail via VM, advised to make OV with any available MD if sxs worsen

## 2015-10-08 NOTE — Telephone Encounter (Signed)
Pt called back stating that she is having chills, fever, body aches, and sweats starting 4 hours after receiving TDAP at [place] 10/06/2015, please advise  Return call at [phone]

## 2015-10-08 NOTE — Telephone Encounter (Signed)
Noted. This is unusual.  pls take Tylenol, Benadryl as needed Would she like to be seen? Thx

## 2015-10-08 NOTE — Telephone Encounter (Signed)
Pt advised.

## 2015-10-09 MED ORDER — CEFDINIR 300 MG PO CAPS: 300.0000 mg | ORAL_CAPSULE | Freq: Two times a day (BID) | ORAL | Status: DC

## 2015-10-09 NOTE — Telephone Encounter (Signed)
OK - emailed Cefdinir Thx

## 2015-10-09 NOTE — Telephone Encounter (Signed)
Pt called back and she states she is coughing up yellow mucus and her chest is congested and she feels she needs an antibiotic.  Pharmacy is [facility] on [address].

## 2015-10-12 NOTE — Telephone Encounter (Signed)
lmovm to be sure she received the medication over the weekend

## 2015-12-22 ENCOUNTER — Telehealth: Payer: Self-pay | Admitting: Internal Medicine

## 2015-12-24 NOTE — Telephone Encounter (Signed)
Called pt no answer LMOM w./MD response pls call back to let use know why antibiotic needing refill...Sharon Paul

## 2015-12-24 NOTE — Telephone Encounter (Signed)
Called pt no  

## 2015-12-24 NOTE — Telephone Encounter (Signed)
Patient called back and stated disregard medication request for cefdinir, patient was unaware of what medication was for.

## 2015-12-24 NOTE — Telephone Encounter (Signed)
What is Cefdinir for? Thx

## 2015-12-30 ENCOUNTER — Telehealth: Payer: Self-pay | Admitting: *Deleted

## 2015-12-30 NOTE — Telephone Encounter (Signed)
Left msg on triage stating she is having problems with very sore muscle especially in her shoulders due to arthritis. Pt states she has appt at the end of the month but wanting to see if md can rx something for sxs...Johny Chess

## 2015-12-30 NOTE — Telephone Encounter (Signed)
Pls hold Crestor until the OV w/me Thx

## 2015-12-31 NOTE — Telephone Encounter (Signed)
Called pt no answer left MD response on vm.../lmb 

## 2016-01-05 ENCOUNTER — Ambulatory Visit: Payer: Medicare Other | Admitting: Internal Medicine

## 2016-01-11 DIAGNOSIS — M722 Plantar fascial fibromatosis: Secondary | ICD-10-CM | POA: Diagnosis not present

## 2016-01-11 DIAGNOSIS — M25511 Pain in right shoulder: Secondary | ICD-10-CM | POA: Diagnosis not present

## 2016-01-26 ENCOUNTER — Encounter: Payer: Self-pay | Admitting: Internal Medicine

## 2016-01-26 ENCOUNTER — Ambulatory Visit (INDEPENDENT_AMBULATORY_CARE_PROVIDER_SITE_OTHER): Payer: Medicare Other | Admitting: Internal Medicine

## 2016-01-26 VITALS — BP 128/84 | HR 63 | Wt 136.0 lb

## 2016-01-26 DIAGNOSIS — I10 Essential (primary) hypertension: Secondary | ICD-10-CM

## 2016-01-26 DIAGNOSIS — E785 Hyperlipidemia, unspecified: Secondary | ICD-10-CM

## 2016-01-26 DIAGNOSIS — M2550 Pain in unspecified joint: Secondary | ICD-10-CM

## 2016-01-26 MED ORDER — IRBESARTAN 300 MG PO TABS: 150.0000 mg | ORAL_TABLET | Freq: Two times a day (BID) | ORAL | Status: DC

## 2016-01-26 MED ORDER — IRBESARTAN 150 MG PO TABS: 150.0000 mg | ORAL_TABLET | Freq: Every day | ORAL | Status: DC

## 2016-01-26 NOTE — Assessment & Plan Note (Signed)
Labetalol, Irbesartan 

## 2016-01-26 NOTE — Assessment & Plan Note (Signed)
Hold Crestor if pains re-occure

## 2016-01-26 NOTE — Progress Notes (Signed)
Pre visit review using our clinic review tool, if applicable. No additional management support is needed unless otherwise documented below in the visit note. 

## 2016-01-26 NOTE — Assessment & Plan Note (Signed)
On Crestor CK, lipids

## 2016-01-26 NOTE — Progress Notes (Signed)
Subjective:  Patient ID: Sharon Paul, female    DOB: May 17, 1946  Age: 70 y.o. MRN: [id number]  CC: No chief complaint on file.   HPI Sharon Paul presents for HTN f/u - issues w/brand Avapro production F/u dyslipidemia C/o shoulder and muscle pain - Dr Gladstone Lighter gave a shoulder injection - better; it also got better off Crestor   Outpatient Prescriptions Prior to Visit  Medication Sig Dispense Refill  . aspirin 81 MG tablet Take 81 mg by mouth daily.    . benzonatate (TESSALON) 100 MG capsule Take 1-2 capsules (100-200 mg total) by mouth 2 (two) times daily as needed for cough. 20 capsule 0  . Cholecalciferol 2000 UNITS TABS Take 1 tablet by mouth daily.    [name] estradiol (CLIMARA - DOSED IN MG/24 HR) 0.025 mg/24hr patch Place 1 patch (0.025 mg total) onto the skin once a week. 12 patch 4  . FLUoxetine (PROZAC) 10 MG capsule Take 1 capsule by mouth  daily 90 capsule 3  . fluticasone (FLONASE) 50 MCG/ACT nasal spray PLACE 2 SPRAYS INTO THE NOSE DAILY. 16 g 2  . guaiFENesin (MUCINEX) 600 MG 12 hr tablet Take 1 tablet (600 mg total) by mouth 2 (two) times daily. 30 tablet 0  . hydrochlorothiazide (MICROZIDE) 12.5 MG capsule Take 1 capsule (12.5 mg total) by mouth daily. 30 capsule 5  . hyoscyamine (LEVSIN, ANASPAZ) 0.125 MG tablet Take 1 tablet (0.125 mg total) by mouth every 4 (four) hours as needed. 30 tablet 3  . ibuprofen (ADVIL,MOTRIN) 600 MG tablet TAKE 1 TABLET (600 MG TOTAL) BY MOUTH EVERY 8 (EIGHT) HOURS AS NEEDED FOR PAIN. 90 tablet 3  . labetalol (NORMODYNE) 300 MG tablet Take 1 tablet (300 mg total) by mouth 2 (two) times daily. 180 tablet 3  . LORazepam (ATIVAN) 0.5 MG tablet Take 1 tablet (0.5 mg total) by mouth 2 (two) times daily. 60 tablet 2  . montelukast (SINGULAIR) 10 MG tablet Take 1 tablet (10 mg total) by mouth daily. 30 tablet 3  . omeprazole (PRILOSEC) 40 MG capsule Take 1 capsule by mouth  daily 90 capsule 3  . Pancrelipase, Lip-Prot-Amyl, 24000  UNITS CPEP 1 po tid ac or w/meal prn 90 capsule 0  . Probiotic Product (ALIGN) 4 MG CAPS Take 1 capsule by mouth daily. 30 capsule 6  . rosuvastatin (CRESTOR) 5 MG tablet Take 1 tablet (5 mg total) by mouth daily. 100 tablet 3  . valACYclovir (VALTREX) 500 MG tablet Take twice daily for 3-5 days 30 tablet 6  . Vitamin D, Ergocalciferol, (DRISDOL) 50000 UNITS CAPS capsule Take 1 capsule by mouth  every 7 days. 11 capsule 2  . irbesartan (AVAPRO) 300 MG tablet Take 0.5 tablets (150 mg total) by mouth 2 (two) times daily. 90 tablet 3   No facility-administered medications prior to visit.    ROS Review of Systems  Constitutional: Negative for chills, activity change, appetite change, fatigue and unexpected weight change.  HENT: Negative for congestion, mouth sores and sinus pressure.   Eyes: Negative for visual disturbance.  Respiratory: Negative for cough and chest tightness.   Gastrointestinal: Negative for nausea and abdominal pain.  Genitourinary: Negative for frequency, difficulty urinating and vaginal pain.  Musculoskeletal: Positive for arthralgias. Negative for back pain and gait problem.  Skin: Negative for pallor and rash.  Neurological: Negative for dizziness, tremors, weakness, numbness and headaches.  Psychiatric/Behavioral: Negative for confusion and sleep disturbance.    Objective:  BP 128/84 mmHg  Pulse 63  Wt 136 lb (61.689 kg)  SpO2 98%  BP Readings from Last 3 Encounters:  01/26/16 128/84  10/06/15 160/82  06/02/15 120/80    Wt Readings from Last 3 Encounters:  01/26/16 136 lb (61.689 kg)  10/06/15 134 lb (60.782 kg)  06/02/15 136 lb (61.689 kg)    Physical Exam  Constitutional: She appears well-developed. No distress.  HENT:  Head: Normocephalic.  Right Ear: External ear normal.  Left Ear: External ear normal.  Nose: Nose normal.  Mouth/Throat: Oropharynx is clear and moist.  Eyes: Conjunctivae are normal. Pupils are equal, round, and reactive to  light. Right eye exhibits no discharge. Left eye exhibits no discharge.  Neck: Normal range of motion. Neck supple. No JVD present. No tracheal deviation present. No thyromegaly present.  Cardiovascular: Normal rate, regular rhythm and normal heart sounds.   Pulmonary/Chest: No stridor. No respiratory distress. She has no wheezes.  Abdominal: Soft. Bowel sounds are normal. She exhibits no distension and no mass. There is no tenderness. There is no rebound and no guarding.  Musculoskeletal: She exhibits tenderness. She exhibits no edema.  Lymphadenopathy:    She has no cervical adenopathy.  Neurological: She displays normal reflexes. No cranial nerve deficit. She exhibits normal muscle tone. Coordination normal.  Skin: No rash noted. No erythema.  Psychiatric: She has a normal mood and affect. Her behavior is normal. Judgment and thought content normal.    Lab Results  Component Value Date   WBC 4.2 09/30/2015   HGB 12.5 09/30/2015   HCT 37.7 09/30/2015   PLT 264.0 09/30/2015   GLUCOSE 105* 09/30/2015   CHOL 174 09/30/2015   TRIG 118.0 09/30/2015   HDL 56.40 09/30/2015   LDLDIRECT 138.2 10/22/2010   LDLCALC 94 09/30/2015   ALT 18 09/30/2015   AST 17 09/30/2015   NA 140 09/30/2015   K 4.4 09/30/2015   CL 105 09/30/2015   CREATININE 0.85 09/30/2015   BUN 15 09/30/2015   CO2 25 09/30/2015   TSH 1.40 09/30/2015   HGBA1C 6.1 09/30/2015    Dg Chest 2 View  03/02/2012  *RADIOLOGY REPORT* Clinical Data: Cough for 4 days, congestion CHEST - 2 VIEW Comparison: Chest x-ray of 04/15/2007 Findings: No active infiltrate or effusion is seen.  Mediastinal contours are stable. There do appear to be calcified mediastinal nodes present most likely due to prior granulomatous disease. Thoracolumbar scoliosis appears stable. IMPRESSION: No active lung disease.  Stable thoracolumbar scoliosis. Original Report Authenticated By: Joretta Bachelor, M.D.   Assessment & Plan:   There are no diagnoses linked  to this encounter. I am having [name] start on irbesartan. I am also having her maintain her ALIGN, fluticasone, aspirin, Vitamin D (Ergocalciferol), Pancrelipase (Lip-Prot-Amyl), guaiFENesin, valACYclovir, ibuprofen, estradiol, hyoscyamine, Cholecalciferol, rosuvastatin, FLUoxetine, LORazepam, labetalol, hydrochlorothiazide, montelukast, benzonatate, omeprazole, and irbesartan.  Meds ordered this encounter  Medications  . irbesartan (AVAPRO) 150 MG tablet    Sig: Take 1 tablet (150 mg total) by mouth daily.    Dispense:  60 tablet    Refill:  11  . irbesartan (AVAPRO) 300 MG tablet    Sig: Take 0.5 tablets (150 mg total) by mouth 2 (two) times daily.    Dispense:  90 tablet    Refill:  3     Follow-up: No Follow-up on file.  Walker Kehr, MD

## 2016-01-27 ENCOUNTER — Other Ambulatory Visit (INDEPENDENT_AMBULATORY_CARE_PROVIDER_SITE_OTHER): Payer: Medicare Other

## 2016-01-27 DIAGNOSIS — M2550 Pain in unspecified joint: Secondary | ICD-10-CM | POA: Diagnosis not present

## 2016-01-27 DIAGNOSIS — E785 Hyperlipidemia, unspecified: Secondary | ICD-10-CM | POA: Diagnosis not present

## 2016-01-27 DIAGNOSIS — I10 Essential (primary) hypertension: Secondary | ICD-10-CM

## 2016-01-27 LAB — LIPID PANEL
Cholesterol: 208 mg/dL — ABNORMAL HIGH (ref 0–200)
HDL: 62.4 mg/dL (ref >39.00)
LDL Cholesterol: 119 mg/dL — ABNORMAL HIGH (ref 0–99)
NONHDL: 146.03
Total CHOL/HDL Ratio: 3
Triglycerides: 133 mg/dL (ref 0.0–149.0)
VLDL: 26.6 mg/dL (ref 0.0–40.0)

## 2016-01-27 LAB — BASIC METABOLIC PANEL
BUN: 14 mg/dL (ref 6–23)
CHLORIDE: 104 meq/L (ref 96–112)
CO2: 30 mEq/L (ref 19–32)
Calcium: 9.5 mg/dL (ref 8.4–10.5)
Creatinine, Ser: 0.84 mg/dL (ref 0.40–1.20)
GFR: 71.4 mL/min (ref >60.00)
Glucose, Bld: 100 mg/dL — ABNORMAL HIGH (ref 70–99)
Potassium: 4.4 mEq/L (ref 3.5–5.1)
SODIUM: 138 meq/L (ref 135–145)

## 2016-01-27 LAB — HEPATIC FUNCTION PANEL
ALBUMIN: 4.3 g/dL (ref 3.5–5.2)
ALT: 17 U/L (ref 0–35)
AST: 16 U/L (ref 0–37)
Alkaline Phosphatase: 66 U/L (ref 39–117)
BILIRUBIN TOTAL: 0.5 mg/dL (ref 0.2–1.2)
Bilirubin, Direct: 0.1 mg/dL (ref 0.0–0.3)
TOTAL PROTEIN: 6.4 g/dL (ref 6.0–8.3)

## 2016-01-27 LAB — TSH: TSH: 1.36 u[IU]/mL (ref 0.35–4.50)

## 2016-01-27 LAB — CK: CK TOTAL: 110 U/L (ref 7–177)

## 2016-01-27 LAB — HEMOGLOBIN A1C: HEMOGLOBIN A1C: 6.3 % (ref 4.6–6.5)

## 2016-02-05 ENCOUNTER — Other Ambulatory Visit: Payer: Self-pay

## 2016-02-05 MED ORDER — VALACYCLOVIR HCL 500 MG PO TABS: ORAL_TABLET | ORAL | Status: DC

## 2016-02-29 ENCOUNTER — Ambulatory Visit (INDEPENDENT_AMBULATORY_CARE_PROVIDER_SITE_OTHER): Payer: Medicare Other | Admitting: Gynecology

## 2016-02-29 ENCOUNTER — Encounter: Payer: Self-pay | Admitting: Gynecology

## 2016-02-29 VITALS — BP 120/82 | Ht 59.0 in | Wt 137.0 lb

## 2016-02-29 DIAGNOSIS — N952 Postmenopausal atrophic vaginitis: Secondary | ICD-10-CM | POA: Diagnosis not present

## 2016-02-29 DIAGNOSIS — N3946 Mixed incontinence: Secondary | ICD-10-CM

## 2016-02-29 DIAGNOSIS — M8580 Other specified disorders of bone density and structure, unspecified site: Secondary | ICD-10-CM

## 2016-02-29 DIAGNOSIS — Z79890 Hormone replacement therapy (postmenopausal): Secondary | ICD-10-CM

## 2016-02-29 DIAGNOSIS — Z01419 Encounter for gynecological examination (general) (routine) without abnormal findings: Secondary | ICD-10-CM | POA: Diagnosis not present

## 2016-02-29 LAB — URINALYSIS W MICROSCOPIC + REFLEX CULTURE
Bacteria, UA: NONE SEEN [HPF]
Bilirubin Urine: NEGATIVE
CASTS: NONE SEEN [LPF]
Crystals: NONE SEEN [HPF]
Glucose, UA: NEGATIVE
HGB URINE DIPSTICK: NEGATIVE
Ketones, ur: NEGATIVE
Leukocytes, UA: NEGATIVE
NITRITE: NEGATIVE
PH: 6 (ref 5.0–8.0)
Protein, ur: NEGATIVE
RBC / HPF: NONE SEEN RBC/HPF (ref <=2)
SPECIFIC GRAVITY, URINE: 1.019 (ref 1.001–1.035)
WBC, UA: NONE SEEN WBC/HPF (ref <=5)
YEAST: NONE SEEN [HPF]

## 2016-02-29 MED ORDER — ESTRADIOL 0.025 MG/24HR TD PTWK: 0.0250 mg | MEDICATED_PATCH | TRANSDERMAL | Status: DC

## 2016-02-29 NOTE — Progress Notes (Signed)
    Sharon Paul Apr 21, 1946 [id number]        70 y.o.  [id number]  for breast and pelvic exam. Several issues noted below.  Past medical history,surgical history, problem list, medications, allergies, family history and social history were all reviewed and documented as reviewed in the EPIC chart.  ROS:  Performed with pertinent positives and negatives included in the history, assessment and plan.   Additional significant findings :  Urinary incontinence as discussed below   Exam: Wandra Scot assistant Filed Vitals:   02/29/16 0925  BP: 120/82  Height: 4\' 11"  (1.499 m)  Weight: 137 lb (62.143 kg)   General appearance:  Normal affect, orientation and appearance. Skin: Grossly normal HEENT: Without gross lesions.  No cervical or supraclavicular adenopathy. Thyroid normal.  Lungs:  Clear without wheezing, rales or rhonchi Cardiac: RR, without RMG Abdominal:  Soft, nontender, without masses, guarding, rebound, organomegaly or hernia Breasts:  Examined lying and sitting without masses, retractions, discharge or axillary adenopathy. Pelvic:  Ext/BUS/vagina With atrophic changes  Adnexa without masses or tenderness    Anus and perineum normal   Rectovaginal normal sphincter tone without palpated masses or tenderness.    Assessment/Plan:  70 y.o. [id number] female for breast and pelvic exam.   1. Postmenopausal/atrophic genital changes/HRT. Patient continues on Climara 0.025 mg patch daily.  She acts it cuts this in half. She's tried stopping but has hot flashes and sweats. I reviewed the new 2017 [employer] HRT guidelines.  I reviewed the shared physician/patient decision process with her. Risks to include thrombosis such as stroke heart attack DVT, breast cancer issues as well as benefits to include possible cardiovascular when started early and bone health reviewed. At this point the patient wants to continue, understands and accepts the risks and I refilled her 1 year. 2. Mixed  incontinence. Patient notes some loss of urine with coughing, sneezing, laughing. Also has urgency symptoms with need to go and loss of urine of cannot make it to the bathroom. This is particularly when her bladder is full. I reviewed the issues of stress incontinence and urge incontinence with her. Behavior modification issues such as frequent voiding and avoiding bladder irritants such as caffeine discussed. OAB medications also reviewed with side effect profile discussed. Ultimate urologic referral reviewed. At this point patients going to try behavior modification techniques and will follow up the continues to be an issue that she wants to treat or be referred for. Check baseline urine analysis today. 3. Osteopenia.  DEXA 03/2015 T score -1.8 FRAX 14%/1.3% stable from prior DEXA. History of marginal vitamin D is the past requiring 50,000 unit treatment. Currently taking 09-2998 units daily. Check baseline vitamin D level today. Plan repeat DEXA at 2 year interval. 4. Mammography 03/2015. Schedule mammogram this August. SBE monthly reviewed. 5. Colonoscopy 2014. Repeat at their recommended interval. 6. Health maintenance. No routine lab work done as this is done at her primary physician's office. Follow up 1 year, sooner as needed.  Greater than 15 minutes of my time in excess of her breast and pelvic exam was spent in direct face to face counseling and coordination of care in regards to her problems of urinary incontinence and new 2017 [employer] guidelines.    Anastasio Auerbach MD, 9:55 AM 02/29/2016

## 2016-02-29 NOTE — Patient Instructions (Signed)

## 2016-03-07 DIAGNOSIS — M25511 Pain in right shoulder: Secondary | ICD-10-CM | POA: Diagnosis not present

## 2016-03-07 DIAGNOSIS — M722 Plantar fascial fibromatosis: Secondary | ICD-10-CM | POA: Diagnosis not present

## 2016-03-23 DIAGNOSIS — M25511 Pain in right shoulder: Secondary | ICD-10-CM | POA: Diagnosis not present

## 2016-03-30 DIAGNOSIS — M25511 Pain in right shoulder: Secondary | ICD-10-CM | POA: Diagnosis not present

## 2016-04-04 DIAGNOSIS — M722 Plantar fascial fibromatosis: Secondary | ICD-10-CM | POA: Diagnosis not present

## 2016-04-05 DIAGNOSIS — M25511 Pain in right shoulder: Secondary | ICD-10-CM | POA: Diagnosis not present

## 2016-04-12 DIAGNOSIS — M25511 Pain in right shoulder: Secondary | ICD-10-CM | POA: Diagnosis not present

## 2016-04-25 DIAGNOSIS — H25813 Combined forms of age-related cataract, bilateral: Secondary | ICD-10-CM | POA: Diagnosis not present

## 2016-05-17 ENCOUNTER — Ambulatory Visit (INDEPENDENT_AMBULATORY_CARE_PROVIDER_SITE_OTHER): Payer: Medicare Other | Admitting: Gastroenterology

## 2016-05-17 ENCOUNTER — Other Ambulatory Visit (INDEPENDENT_AMBULATORY_CARE_PROVIDER_SITE_OTHER): Payer: Medicare Other

## 2016-05-17 ENCOUNTER — Encounter: Payer: Self-pay | Admitting: Gastroenterology

## 2016-05-17 VITALS — BP 118/64 | HR 60 | Ht <= 58 in | Wt 133.4 lb

## 2016-05-17 DIAGNOSIS — R1012 Left upper quadrant pain: Secondary | ICD-10-CM | POA: Diagnosis not present

## 2016-05-17 DIAGNOSIS — K219 Gastro-esophageal reflux disease without esophagitis: Secondary | ICD-10-CM

## 2016-05-17 DIAGNOSIS — K589 Irritable bowel syndrome without diarrhea: Secondary | ICD-10-CM

## 2016-05-17 LAB — IGA: IGA: 98 mg/dL (ref 68–378)

## 2016-05-17 MED ORDER — RANITIDINE HCL 150 MG PO TABS: 150.0000 mg | ORAL_TABLET | Freq: Two times a day (BID) | ORAL | 1 refills | Status: DC

## 2016-05-17 MED ORDER — DICYCLOMINE HCL 10 MG PO CAPS: 10.0000 mg | ORAL_CAPSULE | Freq: Three times a day (TID) | ORAL | 1 refills | Status: DC | PRN

## 2016-05-17 NOTE — Progress Notes (Signed)
HPI :  70 y/o female with a history of reported IBS and possible celiac disease, here for ongoing symptoms of abdominal pain, bloating, and periodic loose stools. She previously followed with Dr. Olevia Perches, new to me. She has not been seen since 02/2013.  She endorses some periodic LUQ pain, longstanding and interrmitent, along with lot of gas and bloating. She thinks bothering her more so for the past month or so, although reports this has been ongoing for years intermittently. She states it comes and goes. She thinks she has felt it daily for the past week. Feels like a nagging sensation. Eating does not make it worse.. No nausea or vomiting. She endorses a lot of bloating and gas, and occasional loose stools. She has a bowel movement up to 3 times per day at times, otherwise normally has a bowel movement once daily. Having a bowel movement does improve her LUQ cramps and she thinks makes it better. She states hyocyamine has helped to relieve it in the past. She reportedly was told she had celiac disease a long time ago, versus gluten intolerance. She had H pylori IgG testing negative in 2012. She does not follow a gluten free diet, she did this previously for years with some benefit in her symptoms. No weight loss or alarm symptoms.    She is taking omeprazole 40mg  po q day for reflux PRN. She thinks when she takes it, her symptoms of pain can be worse, but it does help her heartburn significantly. She is asking about long term safety of PPIs given what has been in the news.   Colonoscopy 02/2013 - normal EGD - 01/2007 - reportedly normal EGD 2004 - gastritis CT scan 2007 for this pain did not show any focal pathology to account for symptoms.  Past Medical History:  Diagnosis Date  . Celiac sprue 2007  . CVD (cardiovascular disease)   . Foot fracture, left 2011  . GERD (gastroesophageal reflux disease)   . Hyperlipidemia   . Osteopenia 03/2015   T score -1.8 FRAX 14%/1.3% stable from prior DEXA    . PVD (peripheral vascular disease) (HCC)    mild carotid ? fibromuscular dysplasia  . Urinary incontinence      Past Surgical History:  Procedure Laterality Date  . APPENDECTOMY    . BREAST SURGERY     right breast biopsy  . ESOPHAGOGASTRODUODENOSCOPY    . ROTATOR CUFF REPAIR     RIGHT  . stress cardiolite  04-18-00  . VAGINAL HYSTERECTOMY  1993   leiomyomata   Family History  Problem Relation Age of Onset  . Glaucoma Mother   . Heart disease Mother   . Hypertension Mother   . Heart disease Father   . Hypertension Father   . Stroke Father   . Hypertension Paternal Grandmother   . Lung cancer Paternal Grandmother   . Hypertension Sister   . Hypertension Brother   . Glaucoma Brother   . Colon cancer Neg Hx    Social History  Substance Use Topics  . Smoking status: Former Smoker    Quit date: 02/11/1970  . Smokeless tobacco: Never Used  . Alcohol use 0.0 oz/week     Comment: very rare   Current Outpatient Prescriptions  Medication Sig Dispense Refill  . aspirin 81 MG tablet Take 81 mg by mouth daily.    . Cholecalciferol 2000 UNITS TABS Take 1 tablet by mouth daily.    [name] estradiol (CLIMARA - DOSED IN MG/24 HR) 0.025 mg/24hr  patch Place 1 patch (0.025 mg total) onto the skin once a week. 12 patch 4  . FLUoxetine (PROZAC) 10 MG capsule Take 1 capsule by mouth  daily 90 capsule 3  . fluticasone (FLONASE) 50 MCG/ACT nasal spray PLACE 2 SPRAYS INTO THE NOSE DAILY. 16 g 2  . ibuprofen (ADVIL,MOTRIN) 600 MG tablet TAKE 1 TABLET (600 MG TOTAL) BY MOUTH EVERY 8 (EIGHT) HOURS AS NEEDED FOR PAIN. 90 tablet 3  . irbesartan (AVAPRO) 300 MG tablet Take 0.5 tablets (150 mg total) by mouth 2 (two) times daily. 90 tablet 3  . labetalol (NORMODYNE) 300 MG tablet Take 1 tablet (300 mg total) by mouth 2 (two) times daily. 180 tablet 3  . LORazepam (ATIVAN) 0.5 MG tablet Take 1 tablet (0.5 mg total) by mouth 2 (two) times daily. (Patient taking differently: Take 0.5 mg by mouth as needed.  ) 60 tablet 2  . omeprazole (PRILOSEC) 40 MG capsule Take 1 capsule by mouth  daily 90 capsule 3  . rosuvastatin (CRESTOR) 5 MG tablet Take 1 tablet (5 mg total) by mouth daily. 100 tablet 3  . valACYclovir (VALTREX) 500 MG tablet Take twice daily for 3-5 days 30 tablet 1   No current facility-administered medications for this visit.    Allergies  Allergen Reactions  . Amlodipine     HA  . Clarithromycin     REACTION: gi upset  . Codeine Nausea And Vomiting  . Esomeprazole Magnesium     REACTION: side pain  . Hydrochlorothiazide     REACTION: leg cramps, dehydration  . Iohexol      Desc: PT HAS SWELLING TO FACE,LIPS AND THROAT WITH CONTRAST DYE   . Kapidex [Dexlansoprazole]     rash  . Losartan Potassium     REACTION: HA  . Olmesartan Medoxomil     REACTION: weak legs  . Ramipril     Per pt: unknown reaction  . Risedronate Sodium     Per pt: unknown reaction     Review of Systems: All systems reviewed and negative except where noted in HPI.   Lab Results  Component Value Date   WBC 4.2 09/30/2015   HGB 12.5 09/30/2015   HCT 37.7 09/30/2015   MCV 88.7 09/30/2015   PLT 264.0 09/30/2015   Lab Results  Component Value Date   CREATININE 0.84 01/27/2016   BUN 14 01/27/2016   NA 138 01/27/2016   K 4.4 01/27/2016   CL 104 01/27/2016   CO2 30 01/27/2016    Lab Results  Component Value Date   ALT 17 01/27/2016   AST 16 01/27/2016   ALKPHOS 66 01/27/2016   BILITOT 0.5 01/27/2016       Physical Exam: BP 118/64 (BP Location: Left Arm, Patient Position: Sitting, Cuff Size: Normal)   Pulse 60   Ht 4' 9.75" (1.467 m) Comment: height measured without shoes  Wt 133 lb 6 oz (60.5 kg)   BMI 28.12 kg/m  Constitutional: Pleasant,well-developed,female in no acute distress. HEENT: Normocephalic and atraumatic. Conjunctivae are normal. No scleral icterus. Neck supple.  Cardiovascular: Normal rate, regular rhythm.  Pulmonary/chest: Effort normal and breath sounds  normal. No wheezing, rales or rhonchi. Abdominal: Soft, nondistended, nontender. Negative carnett,  There are no masses palpable. No hepatomegaly. Extremities: no edema Lymphadenopathy: No cervical adenopathy noted. Neurological: Alert and oriented to person place and time. Skin: Skin is warm and dry. No rashes noted. Psychiatric: Normal mood and affect. Behavior is normal.   ASSESSMENT AND PLAN:  70 y/o female here for reassessment of ongoing issues as below:  LUQ pain / bloating / loose stools - I suspect she likely has IBS-D causing these symptoms, which have been chronic. Prior endoscopy, colonoscopy, and CT abdomen has been unremarkable. She reports a remote diagnosis of "celiac" as she endorsed improvement with gluten free diet, however I suspect she more likely has gluten intolerance. Prior duodenal biopsies negative. Will send TTG to ensure negative. Otherwise counseled her on low FODMOP diet to see if this helps, handout provided. Otherwise, she has responded well to Bentyl in the past for this discomfort, will give 10-20mg  every 8 hours PRN. She can follow up if no improvement.  GERD - longstanding, appears fairly well controlled with PPI but asking about long term risks, which we discussed. We will try to transition her from omeprazole to zantac, which has a better safety profile for long term use. Zantac given 150mg  BID, and should start taking omeprazole every other day for 2 weeks, and then stop. F/u PRN for this issue.   Monette Cellar, MD [facility] Pager [phone]

## 2016-05-17 NOTE — Patient Instructions (Signed)
Your physician has requested that you go to the basement for the following lab work before leaving today: iga, ttg  We have sent the following medications to your pharmacy for you to pick up at your convenience: Zantac 150 mg twice a day Bentyl 10 mg every 8 hrs as needed

## 2016-05-18 ENCOUNTER — Encounter: Payer: Self-pay | Admitting: Gastroenterology

## 2016-05-18 LAB — TISSUE TRANSGLUTAMINASE, IGA: Tissue Transglutaminase Ab, IgA: 1 U/mL (ref <4)

## 2016-05-24 ENCOUNTER — Encounter: Payer: Self-pay | Admitting: Gynecology

## 2016-05-24 DIAGNOSIS — Z1231 Encounter for screening mammogram for malignant neoplasm of breast: Secondary | ICD-10-CM | POA: Diagnosis not present

## 2016-05-31 ENCOUNTER — Ambulatory Visit (INDEPENDENT_AMBULATORY_CARE_PROVIDER_SITE_OTHER)
Admission: RE | Admit: 2016-05-31 | Discharge: 2016-05-31 | Disposition: A | Discharge status: Home / Self Care | Payer: Medicare Other | Source: Ambulatory Visit | Attending: Internal Medicine | Admitting: Internal Medicine

## 2016-05-31 ENCOUNTER — Encounter: Payer: Self-pay | Admitting: Internal Medicine

## 2016-05-31 ENCOUNTER — Ambulatory Visit (INDEPENDENT_AMBULATORY_CARE_PROVIDER_SITE_OTHER): Payer: Medicare Other | Admitting: Internal Medicine

## 2016-05-31 VITALS — BP 120/84 | HR 68 | Temp 98.2°F | Wt 135.0 lb

## 2016-05-31 DIAGNOSIS — R1084 Generalized abdominal pain: Secondary | ICD-10-CM

## 2016-05-31 DIAGNOSIS — I10 Essential (primary) hypertension: Secondary | ICD-10-CM | POA: Diagnosis not present

## 2016-05-31 DIAGNOSIS — Z23 Encounter for immunization: Secondary | ICD-10-CM

## 2016-05-31 DIAGNOSIS — R109 Unspecified abdominal pain: Secondary | ICD-10-CM | POA: Diagnosis not present

## 2016-05-31 DIAGNOSIS — E785 Hyperlipidemia, unspecified: Secondary | ICD-10-CM

## 2016-05-31 DIAGNOSIS — F411 Generalized anxiety disorder: Secondary | ICD-10-CM | POA: Diagnosis not present

## 2016-05-31 DIAGNOSIS — I739 Peripheral vascular disease, unspecified: Secondary | ICD-10-CM | POA: Diagnosis not present

## 2016-05-31 NOTE — Patient Instructions (Signed)
Activated charcoal tablets for gas  Hold Crestor if not better x 2-4 weeks

## 2016-05-31 NOTE — Progress Notes (Signed)
Pre visit review using our clinic review tool, if applicable. No additional management support is needed unless otherwise documented below in the visit note. 

## 2016-05-31 NOTE — Assessment & Plan Note (Signed)
Crestor,  ASA 

## 2016-05-31 NOTE — Progress Notes (Signed)
Subjective:  Patient ID: Sharon Paul, female    DOB: 12/02/1945  Age: 70 y.o. MRN: [id number]  CC: No chief complaint on file.   HPI Naavah Mesenbrink presents for HTN, anxiety, dyslipidemia f/u   Outpatient Medications Prior to Visit  Medication Sig Dispense Refill  . aspirin 81 MG tablet Take 81 mg by mouth daily.    . Cholecalciferol 2000 UNITS TABS Take 1 tablet by mouth daily.    [name] dicyclomine (BENTYL) 10 MG capsule Take 1 capsule (10 mg total) by mouth every 8 (eight) hours as needed for spasms. 90 capsule 1  . estradiol (CLIMARA - DOSED IN MG/24 HR) 0.025 mg/24hr patch Place 1 patch (0.025 mg total) onto the skin once a week. 12 patch 4  . FLUoxetine (PROZAC) 10 MG capsule Take 1 capsule by mouth  daily 90 capsule 3  . fluticasone (FLONASE) 50 MCG/ACT nasal spray PLACE 2 SPRAYS INTO THE NOSE DAILY. 16 g 2  . ibuprofen (ADVIL,MOTRIN) 600 MG tablet TAKE 1 TABLET (600 MG TOTAL) BY MOUTH EVERY 8 (EIGHT) HOURS AS NEEDED FOR PAIN. 90 tablet 3  . irbesartan (AVAPRO) 300 MG tablet Take 0.5 tablets (150 mg total) by mouth 2 (two) times daily. 90 tablet 3  . labetalol (NORMODYNE) 300 MG tablet Take 1 tablet (300 mg total) by mouth 2 (two) times daily. 180 tablet 3  . LORazepam (ATIVAN) 0.5 MG tablet Take 1 tablet (0.5 mg total) by mouth 2 (two) times daily. (Patient taking differently: Take 0.5 mg by mouth as needed. ) 60 tablet 2  . omeprazole (PRILOSEC) 40 MG capsule Take 1 capsule by mouth  daily 90 capsule 3  . ranitidine (ZANTAC) 150 MG tablet Take 1 tablet (150 mg total) by mouth 2 (two) times daily. 90 tablet 1  . rosuvastatin (CRESTOR) 5 MG tablet Take 1 tablet (5 mg total) by mouth daily. 100 tablet 3  . valACYclovir (VALTREX) 500 MG tablet Take twice daily for 3-5 days 30 tablet 1   No facility-administered medications prior to visit.     ROS Review of Systems  Constitutional: Negative for activity change, appetite change, chills, fatigue and unexpected weight  change.  HENT: Negative for congestion, mouth sores and sinus pressure.   Eyes: Negative for visual disturbance.  Respiratory: Negative for cough and chest tightness.   Gastrointestinal: Negative for abdominal pain and nausea.  Genitourinary: Negative for difficulty urinating, frequency and vaginal pain.  Musculoskeletal: Negative for back pain and gait problem.  Skin: Negative for pallor and rash.  Neurological: Negative for dizziness, tremors, weakness, numbness and headaches.  Psychiatric/Behavioral: Negative for confusion, sleep disturbance and suicidal ideas.    Objective:  BP 120/84   Pulse 68   Temp 98.2 F (36.8 C) (Oral)   Wt 135 lb (61.2 kg)   SpO2 98%   BMI 28.46 kg/m   BP Readings from Last 3 Encounters:  05/31/16 120/84  05/17/16 118/64  02/29/16 120/82    Wt Readings from Last 3 Encounters:  05/31/16 135 lb (61.2 kg)  05/17/16 133 lb 6 oz (60.5 kg)  02/29/16 137 lb (62.1 kg)    Physical Exam  Constitutional: She appears well-developed. No distress.  HENT:  Head: Normocephalic.  Right Ear: External ear normal.  Left Ear: External ear normal.  Nose: Nose normal.  Mouth/Throat: Oropharynx is clear and moist.  Eyes: Conjunctivae are normal. Pupils are equal, round, and reactive to light. Right eye exhibits no discharge. Left eye exhibits no discharge.  Neck: Normal  range of motion. Neck supple. No JVD present. No tracheal deviation present. No thyromegaly present.  Cardiovascular: Normal rate, regular rhythm and normal heart sounds.   Pulmonary/Chest: No stridor. No respiratory distress. She has no wheezes.  Abdominal: Soft. Bowel sounds are normal. She exhibits no distension and no mass. There is no tenderness. There is no rebound and no guarding.  Musculoskeletal: She exhibits no edema or tenderness.  Lymphadenopathy:    She has no cervical adenopathy.  Neurological: She displays normal reflexes. No cranial nerve deficit. She exhibits normal muscle tone.  Coordination normal.  Skin: No rash noted. No erythema.  Psychiatric: She has a normal mood and affect. Her behavior is normal. Judgment and thought content normal.    Lab Results  Component Value Date   WBC 4.2 09/30/2015   HGB 12.5 09/30/2015   HCT 37.7 09/30/2015   PLT 264.0 09/30/2015   GLUCOSE 100 (H) 01/27/2016   CHOL 208 (H) 01/27/2016   TRIG 133.0 01/27/2016   HDL 62.40 01/27/2016   LDLDIRECT 138.2 10/22/2010   LDLCALC 119 (H) 01/27/2016   ALT 17 01/27/2016   AST 16 01/27/2016   NA 138 01/27/2016   K 4.4 01/27/2016   CL 104 01/27/2016   CREATININE 0.84 01/27/2016   BUN 14 01/27/2016   CO2 30 01/27/2016   TSH 1.36 01/27/2016   HGBA1C 6.3 01/27/2016    Dg Chest 2 View  Result Date: 03/02/2012 *RADIOLOGY REPORT* Clinical Data: Cough for 4 days, congestion CHEST - 2 VIEW Comparison: Chest x-ray of 04/15/2007 Findings: No active infiltrate or effusion is seen.  Mediastinal contours are stable. There do appear to be calcified mediastinal nodes present most likely due to prior granulomatous disease. Thoracolumbar scoliosis appears stable. IMPRESSION: No active lung disease.  Stable thoracolumbar scoliosis. Original Report Authenticated By: Joretta Bachelor, M.D.   Assessment & Plan:   There are no diagnoses linked to this encounter. I am having [name] maintain her fluticasone, aspirin, ibuprofen, Cholecalciferol, rosuvastatin, FLUoxetine, LORazepam, labetalol, omeprazole, irbesartan, valACYclovir, estradiol, dicyclomine, and ranitidine.  No orders of the defined types were placed in this encounter.    Follow-up: No Follow-up on file.  Walker Kehr, MD

## 2016-05-31 NOTE — Assessment & Plan Note (Signed)
Labetalol, Irbesartan 

## 2016-05-31 NOTE — Assessment & Plan Note (Signed)
X ray Bentyl Hold Crestor x 2-3 weeks

## 2016-05-31 NOTE — Assessment & Plan Note (Signed)
Prozac,  Lorazepam prn  Potential benefits of a long term benzodiazepines  use as well as potential risks  and complications were explained to the patient and were aknowledged. 

## 2016-06-03 ENCOUNTER — Telehealth: Payer: Self-pay | Admitting: *Deleted

## 2016-06-03 NOTE — Telephone Encounter (Signed)
Left message to call back or check her MyChart message.   Entered by Cassandria Anger, MD at 05/31/2016 9:12 PM  Dear Sharon Paul,  Your abdomen X ray shows a "moderate" stool accumulation. We need to clear the back up. Please dilute 4 scoops or 4 packets of Miralax in a big glass of water or juice and drink it.  Sincerely,  Sharon Kehr, MD

## 2016-06-10 ENCOUNTER — Other Ambulatory Visit: Payer: Self-pay | Admitting: *Deleted

## 2016-06-10 ENCOUNTER — Telehealth: Payer: Self-pay | Admitting: Gastroenterology

## 2016-06-10 MED ORDER — ROSUVASTATIN CALCIUM 5 MG PO TABS: 5.0000 mg | ORAL_TABLET | Freq: Every day | ORAL | 3 refills | Status: DC

## 2016-06-10 MED ORDER — IRBESARTAN 300 MG PO TABS: 150.0000 mg | ORAL_TABLET | Freq: Two times a day (BID) | ORAL | 3 refills | Status: DC

## 2016-06-10 NOTE — Telephone Encounter (Signed)
Reprinted scripts Dr. Camila Li is out of office will have Dr. Quay Burow to sign...Johny Chess

## 2016-06-10 NOTE — Addendum Note (Signed)
Addended by: Earnstine Regal on: 06/10/2016 09:58 AM   Modules accepted: Orders

## 2016-06-10 NOTE — Telephone Encounter (Signed)
If Zantac is not controlling symptoms well then she can resume the omeprazole and use it PRN, or the lowest dose daily needed to control symptoms. Knowing the risks of long term PPIs she had wished to avoid them, but it appears she will need it to some extent to control her symptoms as long as she understands the risks which we discussed in clinic. Thanks

## 2016-06-10 NOTE — Telephone Encounter (Signed)
Patient is having break through reflux while on Zantac 150 mg bid. Is not taking Prilosec. Advised her to elevate her head while sleeping, try bland diet. Told her that I would let her know on Monday, when Dr. Havery Moros will be back in town, what he suggests.

## 2016-06-10 NOTE — Telephone Encounter (Signed)
Pt left msg on triage yesterday afternoon requesting written rx on her crestor & avapro to send to [facility]. Notified pt rx's ready for pick-up...Johny Chess

## 2016-06-13 NOTE — Telephone Encounter (Signed)
Patient advised to take lowest dose of omeprazole, use prn. Patient did take one omeprazole on Friday, she felt much better over the weekend. Patient also states she has stopped all dairy products in her diet. Discussed risks associated with omeprazole, low calcium levels, suggested she talk to her PCP re: calcium and if she needs a supplement. Patient will discuss this with PCP, she understands long-term side effects of PPI's.

## 2016-06-29 ENCOUNTER — Other Ambulatory Visit: Payer: Self-pay | Admitting: Internal Medicine

## 2016-06-29 DIAGNOSIS — F411 Generalized anxiety disorder: Secondary | ICD-10-CM

## 2016-06-30 ENCOUNTER — Other Ambulatory Visit: Payer: Self-pay | Admitting: Internal Medicine

## 2016-06-30 DIAGNOSIS — F411 Generalized anxiety disorder: Secondary | ICD-10-CM

## 2016-07-01 NOTE — Telephone Encounter (Signed)
Done

## 2016-07-29 ENCOUNTER — Encounter: Payer: Self-pay | Admitting: Nurse Practitioner

## 2016-07-29 ENCOUNTER — Ambulatory Visit (INDEPENDENT_AMBULATORY_CARE_PROVIDER_SITE_OTHER): Payer: Medicare Other | Admitting: Nurse Practitioner

## 2016-07-29 VITALS — BP 112/70 | HR 65 | Temp 98.2°F | Ht 59.0 in | Wt 131.0 lb

## 2016-07-29 DIAGNOSIS — J0140 Acute pansinusitis, unspecified: Secondary | ICD-10-CM | POA: Diagnosis not present

## 2016-07-29 DIAGNOSIS — H6993 Unspecified Eustachian tube disorder, bilateral: Secondary | ICD-10-CM

## 2016-07-29 DIAGNOSIS — H6983 Other specified disorders of Eustachian tube, bilateral: Secondary | ICD-10-CM | POA: Diagnosis not present

## 2016-07-29 MED ORDER — METHYLPREDNISOLONE ACETATE 40 MG/ML IJ SUSP
40.0000 mg | Freq: Once | INTRAMUSCULAR | Status: AC
  Administered 2016-07-29: 40 mg via INTRAMUSCULAR

## 2016-07-29 MED ORDER — DOXYCYCLINE HYCLATE 100 MG PO TABS: 100.0000 mg | ORAL_TABLET | Freq: Two times a day (BID) | ORAL | 0 refills | Status: AC

## 2016-07-29 MED ORDER — IPRATROPIUM BROMIDE 0.03 % NA SOLN: 2.0000 | Freq: Two times a day (BID) | NASAL | 0 refills | Status: DC

## 2016-07-29 MED ORDER — GUAIFENESIN ER 600 MG PO TB12: 600.0000 mg | ORAL_TABLET | Freq: Two times a day (BID) | ORAL | 0 refills | Status: DC | PRN

## 2016-07-29 NOTE — Progress Notes (Signed)
Pre visit review using our clinic review tool, if applicable. No additional management support is needed unless otherwise documented below in the visit note. 

## 2016-07-29 NOTE — Patient Instructions (Signed)
URI Instructions: Use over-the-counter  "cold" medicines  such as "Tylenol cold" , "Advil cold",  "Mucinex" or" Mucinex D"  for cough and congestion.  Avoid decongestants if you have high blood pressure. Use" Delsym" or" Robitussin" cough syrup varietis for cough.  You can use plain "Tylenol" or "Advi"l for fever, chills and achyness.   "Common cold" symptoms are usually triggered by a virus.  The antibiotics are usually not necessary. On average, a" viral cold" illness would take 4-7 days to resolve. Please, make an appointment if you are not better or if you're worse.  Encourage adequate oral hydration

## 2016-07-29 NOTE — Progress Notes (Signed)
Subjective:  Patient ID: Sharon Paul, female    DOB: 06-14-1946  Age: 70 y.o. MRN: [id number]  CC: Sore Throat (sore throat,cough dark mucus at times,loss voice for about 1 wk. took corredin,throat spray and losenges. )   Sinus Problem  This is a new problem. The current episode started in the past 7 days. The problem has been gradually worsening since onset. Associated symptoms include chills, congestion, coughing, ear pain, headaches, a hoarse voice, sinus pressure, sneezing, a sore throat and swollen glands. Pertinent negatives include no diaphoresis, neck pain or shortness of breath. Past treatments include acetaminophen, oral decongestants and spray decongestants. The treatment provided mild relief.    Outpatient Medications Prior to Visit  Medication Sig Dispense Refill  . aspirin 81 MG tablet Take 81 mg by mouth daily.    . Cholecalciferol 2000 UNITS TABS Take 1 tablet by mouth daily.    [name] estradiol (CLIMARA - DOSED IN MG/24 HR) 0.025 mg/24hr patch Place 1 patch (0.025 mg total) onto the skin once a week. 12 patch 4  . FLUoxetine (PROZAC) 10 MG capsule Take 1 capsule by mouth  daily 90 capsule 3  . fluticasone (FLONASE) 50 MCG/ACT nasal spray PLACE 2 SPRAYS INTO THE NOSE DAILY. 16 g 2  . ibuprofen (ADVIL,MOTRIN) 600 MG tablet TAKE 1 TABLET (600 MG TOTAL) BY MOUTH EVERY 8 (EIGHT) HOURS AS NEEDED FOR PAIN. 90 tablet 3  . irbesartan (AVAPRO) 300 MG tablet Take 0.5 tablets (150 mg total) by mouth 2 (two) times daily. 90 tablet 3  . labetalol (NORMODYNE) 300 MG tablet Take 1 tablet (300 mg total) by mouth 2 (two) times daily. 180 tablet 3  . LORazepam (ATIVAN) 0.5 MG tablet TAKE 1 TABLET BY MOUTH TWICE A DAY 60 tablet 3  . ranitidine (ZANTAC) 150 MG tablet Take 1 tablet (150 mg total) by mouth 2 (two) times daily. 90 tablet 1  . rosuvastatin (CRESTOR) 5 MG tablet Take 1 tablet (5 mg total) by mouth daily. 90 tablet 3  . valACYclovir (VALTREX) 500 MG tablet Take twice daily for  3-5 days 30 tablet 1  . dicyclomine (BENTYL) 10 MG capsule Take 1 capsule (10 mg total) by mouth every 8 (eight) hours as needed for spasms. (Patient not taking: Reported on 07/29/2016) 90 capsule 1  . omeprazole (PRILOSEC) 40 MG capsule Take 1 capsule by mouth  daily (Patient not taking: Reported on 07/29/2016) 90 capsule 3   No facility-administered medications prior to visit.     ROS See HPI  Objective:  BP 112/70   Pulse 65   Temp 98.2 F (36.8 C)   Ht 4\' 11"  (1.499 m)   Wt 131 lb (59.4 kg)   SpO2 98%   BMI 26.46 kg/m   BP Readings from Last 3 Encounters:  07/29/16 112/70  05/31/16 120/84  05/17/16 118/64    Wt Readings from Last 3 Encounters:  07/29/16 131 lb (59.4 kg)  05/31/16 135 lb (61.2 kg)  05/17/16 133 lb 6 oz (60.5 kg)    Physical Exam  Constitutional: She is oriented to person, place, and time. No distress.  HENT:  Right Ear: External ear and ear canal normal. A middle ear effusion is present.  Left Ear: External ear and ear canal normal. A middle ear effusion is present.  Nose: Right sinus exhibits maxillary sinus tenderness. Left sinus exhibits maxillary sinus tenderness and frontal sinus tenderness.  Mouth/Throat: No trismus in the jaw. Posterior oropharyngeal erythema present. No oropharyngeal exudate.  Eyes:  No scleral icterus.  Neck: Normal range of motion. Neck supple.  Cardiovascular: Normal rate and normal heart sounds.   Pulmonary/Chest: Effort normal and breath sounds normal.  Musculoskeletal: She exhibits no edema.  Lymphadenopathy:    She has cervical adenopathy.  Neurological: She is alert and oriented to person, place, and time.  Skin: Skin is warm and dry.  Vitals reviewed.   Lab Results  Component Value Date   WBC 4.2 09/30/2015   HGB 12.5 09/30/2015   HCT 37.7 09/30/2015   PLT 264.0 09/30/2015   GLUCOSE 100 (H) 01/27/2016   CHOL 208 (H) 01/27/2016   TRIG 133.0 01/27/2016   HDL 62.40 01/27/2016   LDLDIRECT 138.2 10/22/2010    LDLCALC 119 (H) 01/27/2016   ALT 17 01/27/2016   AST 16 01/27/2016   NA 138 01/27/2016   K 4.4 01/27/2016   CL 104 01/27/2016   CREATININE 0.84 01/27/2016   BUN 14 01/27/2016   CO2 30 01/27/2016   TSH 1.36 01/27/2016   HGBA1C 6.3 01/27/2016    Dg Abd 2 Views  Result Date: 05/31/2016 CLINICAL DATA:  Abdominal pain EXAM: ABDOMEN - 2 VIEW COMPARISON:  CT abdomen and pelvis April 21, 2006 FINDINGS: Supine and upright abdomen images obtained. There is moderate stool throughout the colon. There is no bowel dilatation or air-fluid level suggesting bowel obstruction. No free air. Visualized lung bases are clear. There is calcification in both iliac arteries. There are phleboliths in the pelvis. 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 IMPRESSION: Moderate stool in colon. No bowel obstruction or free air evident. 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 Electronically Signed   By: Lowella Grip III M.D.   On: 05/31/2016 19:49    Assessment & Plan:   Knightley was seen today for sore throat.  Diagnoses and all orders for this visit:  Acute non-recurrent pansinusitis -     methylPREDNISolone acetate (DEPO-MEDROL) injection 40 mg; Inject 1 mL (40 mg total) into the muscle once. -     guaiFENesin (MUCINEX) 600 MG 12 hr tablet; Take 1 tablet (600 mg total) by mouth 2 (two) times daily as needed for cough or to loosen phlegm. -     doxycycline (VIBRA-TABS) 100 MG tablet; Take 1 tablet (100 mg total) by mouth 2 (two) times daily. -     ipratropium (ATROVENT) 0.03 % nasal spray; Place 2 sprays into both nostrils 2 (two) times daily. Do not use for more than 5days.  Eustachian tube dysfunction, bilateral -     methylPREDNISolone acetate (DEPO-MEDROL) injection 40 mg; Inject 1 mL (40 mg total) into the muscle once. -     guaiFENesin (MUCINEX) 600 MG 12 hr tablet; Take 1 tablet (600 mg total) by mouth 2 (two) times daily as needed for cough or to loosen phlegm. -     doxycycline (VIBRA-TABS) 100 MG tablet; Take 1 tablet  (100 mg total) by mouth 2 (two) times daily. -     ipratropium (ATROVENT) 0.03 % nasal spray; Place 2 sprays into both nostrils 2 (two) times daily. Do not use for more than 5days.   I am having [name] start on guaiFENesin, doxycycline, and ipratropium. I am also having her maintain her fluticasone, aspirin, ibuprofen, Cholecalciferol, FLUoxetine, labetalol, omeprazole, valACYclovir, estradiol, dicyclomine, ranitidine, irbesartan, rosuvastatin, and LORazepam. We will continue to administer methylPREDNISolone acetate.  Meds ordered this encounter  Medications  . methylPREDNISolone acetate (DEPO-MEDROL) injection 40 mg  . guaiFENesin (MUCINEX) 600 MG 12 hr tablet    Sig: Take 1 tablet (  600 mg total) by mouth 2 (two) times daily as needed for cough or to loosen phlegm.    Dispense:  14 tablet    Refill:  0    Order Specific Question:   Supervising Provider    Answer:   Cassandria Anger [[id number]]  . doxycycline (VIBRA-TABS) 100 MG tablet    Sig: Take 1 tablet (100 mg total) by mouth 2 (two) times daily.    Dispense:  14 tablet    Refill:  0    Order Specific Question:   Supervising Provider    Answer:   Cassandria Anger [[id number]]  . ipratropium (ATROVENT) 0.03 % nasal spray    Sig: Place 2 sprays into both nostrils 2 (two) times daily. Do not use for more than 5days.    Dispense:  30 mL    Refill:  0    Order Specific Question:   Supervising Provider    Answer:   Cassandria Anger [[id number]]    Follow-up: No Follow-up on file.  Wilfred Lacy, NP

## 2016-08-04 ENCOUNTER — Other Ambulatory Visit: Payer: Self-pay | Admitting: Gastroenterology

## 2016-08-15 ENCOUNTER — Other Ambulatory Visit: Payer: Self-pay | Admitting: Internal Medicine

## 2016-08-19 ENCOUNTER — Ambulatory Visit (INDEPENDENT_AMBULATORY_CARE_PROVIDER_SITE_OTHER): Payer: Medicare Other | Admitting: Internal Medicine

## 2016-08-19 ENCOUNTER — Encounter: Payer: Self-pay | Admitting: Internal Medicine

## 2016-08-19 VITALS — BP 124/70 | HR 75 | Temp 98.9°F | Resp 20 | Wt 131.0 lb

## 2016-08-19 DIAGNOSIS — R05 Cough: Secondary | ICD-10-CM

## 2016-08-19 DIAGNOSIS — I10 Essential (primary) hypertension: Secondary | ICD-10-CM | POA: Diagnosis not present

## 2016-08-19 DIAGNOSIS — R059 Cough, unspecified: Secondary | ICD-10-CM

## 2016-08-19 MED ORDER — LEVOFLOXACIN 500 MG PO TABS: 500.0000 mg | ORAL_TABLET | Freq: Every day | ORAL | 0 refills | Status: AC

## 2016-08-19 MED ORDER — HYDROCODONE-HOMATROPINE 5-1.5 MG/5ML PO SYRP: 5.0000 mL | ORAL_SOLUTION | Freq: Four times a day (QID) | ORAL | 0 refills | Status: AC | PRN

## 2016-08-19 NOTE — Patient Instructions (Signed)
Please take all new medication as prescribed - the antibiotic, and cough medicine if needed  Please continue all other medications as before, and refills have been done if requested.  Please have the pharmacy call with any other refills you may need.  Please keep your appointments with your specialists as you may have planned     

## 2016-08-19 NOTE — Progress Notes (Signed)
Pre visit review using our clinic review tool, if applicable. No additional management support is needed unless otherwise documented below in the visit note. 

## 2016-08-19 NOTE — Progress Notes (Signed)
Subjective:    Patient ID: [name], female    DOB: 1946-07-19, 70 y.o.   MRN: [id number]  HPI  Here with acute onset mild to mod 2-3 days ST, HA, general weakness and malaise, with prod cough greenish sputum, but Pt denies chest pain, increased sob or doe, wheezing, orthopnea, PND, increased LE swelling, palpitations, dizziness or syncope. Just had a similar illness more sinus related a few wks ago, seen per NP and improved with antibx. So far this time mucinex, robitusin, tylenol  No help.  Pt denies new neurological symptoms such as new headache, or facial or extremity weakness or numbness   Pt denies polydipsia, polyuria, or low sugar symptoms such as weakness or confusion improved with po intake.  Pt states overall good compliance with meds, trying to follow lower cholesterol, diabetic diet, wt overall stable but little exercise however.    Past Medical History:  Diagnosis Date  . Celiac sprue 2007  . CVD (cardiovascular disease)   . Foot fracture, left 2011  . GERD (gastroesophageal reflux disease)   . Hyperlipidemia   . Osteopenia 03/2015   T score -1.8 FRAX 14%/1.3% stable from prior DEXA  . PVD (peripheral vascular disease) (HCC)    mild carotid ? fibromuscular dysplasia  . Urinary incontinence    Past Surgical History:  Procedure Laterality Date  . APPENDECTOMY    . BREAST SURGERY     right breast biopsy  . ESOPHAGOGASTRODUODENOSCOPY    . ROTATOR CUFF REPAIR     RIGHT  . stress cardiolite  04-18-00  . 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 She has never used smokeless tobacco. She reports that she drinks alcohol. She reports that she does not use drugs. family history includes Glaucoma in her brother and mother; Heart disease in her father and mother; Hypertension in her brother, father, mother, paternal grandmother, and sister; Lung cancer in her paternal grandmother; Stroke in her father. Allergies    Allergen Reactions  . Amlodipine     HA  . Clarithromycin     REACTION: gi upset  . Codeine Nausea And Vomiting  . Esomeprazole Magnesium     REACTION: side pain  . Hydrochlorothiazide     REACTION: leg cramps, dehydration  . Iohexol      Desc: PT HAS SWELLING TO FACE,LIPS AND THROAT WITH CONTRAST DYE   . Kapidex [Dexlansoprazole]     rash  . Losartan Potassium     REACTION: HA  . Olmesartan Medoxomil     REACTION: weak legs  . Ramipril     Per pt: unknown reaction  . Risedronate Sodium     Per pt: unknown reaction   Current Outpatient Prescriptions on File Prior to Visit  Medication Sig Dispense Refill  . aspirin 81 MG tablet Take 81 mg by mouth daily.    . Cholecalciferol 2000 UNITS TABS Take 1 tablet by mouth daily.    [name] dicyclomine (BENTYL) 10 MG capsule Take 1 capsule (10 mg total) by mouth every 8 (eight) hours as needed for spasms. 90 capsule 1  . estradiol (CLIMARA - DOSED IN MG/24 HR) 0.025 mg/24hr patch Place 1 patch (0.025 mg total) onto the skin once a week. 12 patch 4  . FLUoxetine (PROZAC) 10 MG capsule TAKE 1 CAPSULE BY MOUTH  DAILY 90 capsule 1  . fluticasone (FLONASE) 50 MCG/ACT nasal spray PLACE 2 SPRAYS INTO THE NOSE DAILY. 16 g  2  . guaiFENesin (MUCINEX) 600 MG 12 hr tablet Take 1 tablet (600 mg total) by mouth 2 (two) times daily as needed for cough or to loosen phlegm. 14 tablet 0  . ibuprofen (ADVIL,MOTRIN) 600 MG tablet TAKE 1 TABLET (600 MG TOTAL) BY MOUTH EVERY 8 (EIGHT) HOURS AS NEEDED FOR PAIN. 90 tablet 3  . ipratropium (ATROVENT) 0.03 % nasal spray Place 2 sprays into both nostrils 2 (two) times daily. Do not use for more than 5days. 30 mL 0  . irbesartan (AVAPRO) 300 MG tablet Take 0.5 tablets (150 mg total) by mouth 2 (two) times daily. 90 tablet 3  . labetalol (NORMODYNE) 300 MG tablet TAKE 1 TABLET BY MOUTH TWO  TIMES DAILY 180 tablet 3  . LORazepam (ATIVAN) 0.5 MG tablet TAKE 1 TABLET BY MOUTH TWICE A DAY 60 tablet 3  . omeprazole (PRILOSEC)  40 MG capsule Take 1 capsule by mouth  daily 90 capsule 3  . ranitidine (ZANTAC) 150 MG tablet TAKE 1 TABLET BY MOUTH 2 TIMES DAILY 90 tablet 1  . rosuvastatin (CRESTOR) 5 MG tablet Take 1 tablet (5 mg total) by mouth daily. 90 tablet 3  . valACYclovir (VALTREX) 500 MG tablet Take twice daily for 3-5 days 30 tablet 1   No current facility-administered medications on file prior to visit.    Review of Systems  All other system neg per pt    Objective:   Physical Exam BP 124/70   Pulse 75   Temp 98.9 F (37.2 C) (Oral)   Resp 20   Wt 131 lb (59.4 kg)   SpO2 97%   BMI 26.46 kg/m  VS noted, mild ill Constitutional: Pt appears in no apparent distress HENT: Head: NCAT.  Right Ear: External ear normal.  Left Ear: External ear normal.  Bilat tm's with mild erythema.  Max sinus areas non tender.  Pharynx with mild erythema, no exudate Eyes: . Pupils are equal, round, and reactive to light. Conjunctivae and EOM are normal Neck: Normal range of motion. Neck supple.  Cardiovascular: Normal rate and regular rhythm.   Pulmonary/Chest: Effort normal and breath sounds decreased without rales or wheezing.  Neurological: Pt is alert. Not confused , motor grossly intact Skin: Skin is warm. No rash, no LE edema Psychiatric: Pt behavior is normal. No agitation.  No other new exam findings    Assessment & Plan:

## 2016-08-19 NOTE — Assessment & Plan Note (Signed)
stable overall by history and exam, recent data reviewed with pt, and pt to continue medical treatment as before,  to f/u any worsening symptoms or concerns BP Readings from Last 3 Encounters:  08/19/16 124/70  07/29/16 112/70  05/31/16 120/84

## 2016-08-19 NOTE — Assessment & Plan Note (Signed)
Mild to mod, c/w bronchitis vs pna, declines cxr, for antibx course, cough med prn,  to f/u any worsening symptoms or concerns 

## 2016-08-31 ENCOUNTER — Encounter: Payer: Medicare Other | Admitting: Internal Medicine

## 2016-09-08 ENCOUNTER — Other Ambulatory Visit (INDEPENDENT_AMBULATORY_CARE_PROVIDER_SITE_OTHER): Payer: Medicare Other

## 2016-09-08 DIAGNOSIS — I10 Essential (primary) hypertension: Secondary | ICD-10-CM | POA: Diagnosis not present

## 2016-09-08 DIAGNOSIS — R1084 Generalized abdominal pain: Secondary | ICD-10-CM

## 2016-09-08 DIAGNOSIS — E785 Hyperlipidemia, unspecified: Secondary | ICD-10-CM | POA: Diagnosis not present

## 2016-09-08 LAB — URINALYSIS
BILIRUBIN URINE: NEGATIVE
HGB URINE DIPSTICK: NEGATIVE
Ketones, ur: NEGATIVE
LEUKOCYTES UA: NEGATIVE
NITRITE: NEGATIVE
Specific Gravity, Urine: 1.02 (ref 1.000–1.030)
TOTAL PROTEIN, URINE-UPE24: NEGATIVE
UROBILINOGEN UA: 0.2 (ref 0.0–1.0)
Urine Glucose: NEGATIVE
pH: 7 (ref 5.0–8.0)

## 2016-09-08 LAB — BASIC METABOLIC PANEL
BUN: 12 mg/dL (ref 6–23)
CO2: 29 mEq/L (ref 19–32)
Calcium: 9.7 mg/dL (ref 8.4–10.5)
Chloride: 104 mEq/L (ref 96–112)
Creatinine, Ser: 0.82 mg/dL (ref 0.40–1.20)
GFR: 73.28 mL/min (ref >60.00)
GLUCOSE: 100 mg/dL — AB (ref 70–99)
POTASSIUM: 4.4 meq/L (ref 3.5–5.1)
Sodium: 140 mEq/L (ref 135–145)

## 2016-09-08 LAB — CBC WITH DIFFERENTIAL/PLATELET
BASOS PCT: 0.5 % (ref 0.0–3.0)
Basophils Absolute: 0 10*3/uL (ref 0.0–0.1)
EOS PCT: 1.9 % (ref 0.0–5.0)
Eosinophils Absolute: 0.1 10*3/uL (ref 0.0–0.7)
HCT: 36.9 % (ref 36.0–46.0)
Hemoglobin: 12.8 g/dL (ref 12.0–15.0)
LYMPHS ABS: 1.4 10*3/uL (ref 0.7–4.0)
Lymphocytes Relative: 37.5 % (ref 12.0–46.0)
MCHC: 34.7 g/dL (ref 30.0–36.0)
MCV: 89.3 fl (ref 78.0–100.0)
MONO ABS: 0.4 10*3/uL (ref 0.1–1.0)
Monocytes Relative: 12.3 % — ABNORMAL HIGH (ref 3.0–12.0)
NEUTROS PCT: 47.8 % (ref 43.0–77.0)
Neutro Abs: 1.7 10*3/uL (ref 1.4–7.7)
Platelets: 263 10*3/uL (ref 150.0–400.0)
RBC: 4.13 Mil/uL (ref 3.87–5.11)
RDW: 13.3 % (ref 11.5–15.5)
WBC: 3.6 10*3/uL — ABNORMAL LOW (ref 4.0–10.5)

## 2016-09-08 LAB — HEPATITIS C ANTIBODY: HCV Ab: NEGATIVE

## 2016-09-08 LAB — LIPID PANEL
CHOLESTEROL: 180 mg/dL (ref 0–200)
HDL: 64.5 mg/dL (ref >39.00)
LDL CALC: 95 mg/dL (ref 0–99)
NonHDL: 115.52
TRIGLYCERIDES: 103 mg/dL (ref 0.0–149.0)
Total CHOL/HDL Ratio: 3
VLDL: 20.6 mg/dL (ref 0.0–40.0)

## 2016-09-08 LAB — HEPATIC FUNCTION PANEL
ALK PHOS: 73 U/L (ref 39–117)
ALT: 19 U/L (ref 0–35)
AST: 18 U/L (ref 0–37)
Albumin: 4.4 g/dL (ref 3.5–5.2)
BILIRUBIN DIRECT: 0.1 mg/dL (ref 0.0–0.3)
BILIRUBIN TOTAL: 0.5 mg/dL (ref 0.2–1.2)
TOTAL PROTEIN: 6.9 g/dL (ref 6.0–8.3)

## 2016-09-08 LAB — TSH: TSH: 1.68 u[IU]/mL (ref 0.35–4.50)

## 2016-09-13 ENCOUNTER — Ambulatory Visit (INDEPENDENT_AMBULATORY_CARE_PROVIDER_SITE_OTHER): Payer: Medicare Other | Admitting: Internal Medicine

## 2016-09-13 ENCOUNTER — Encounter: Payer: Self-pay | Admitting: Internal Medicine

## 2016-09-13 VITALS — BP 130/80 | HR 58 | Ht 59.0 in | Wt 128.0 lb

## 2016-09-13 DIAGNOSIS — E739 Lactose intolerance, unspecified: Secondary | ICD-10-CM | POA: Insufficient documentation

## 2016-09-13 DIAGNOSIS — J0100 Acute maxillary sinusitis, unspecified: Secondary | ICD-10-CM

## 2016-09-13 DIAGNOSIS — Z0000 Encounter for general adult medical examination without abnormal findings: Secondary | ICD-10-CM

## 2016-09-13 DIAGNOSIS — J019 Acute sinusitis, unspecified: Secondary | ICD-10-CM | POA: Insufficient documentation

## 2016-09-13 DIAGNOSIS — R1084 Generalized abdominal pain: Secondary | ICD-10-CM | POA: Diagnosis not present

## 2016-09-13 DIAGNOSIS — F411 Generalized anxiety disorder: Secondary | ICD-10-CM

## 2016-09-13 DIAGNOSIS — K219 Gastro-esophageal reflux disease without esophagitis: Secondary | ICD-10-CM | POA: Diagnosis not present

## 2016-09-13 MED ORDER — PROMETHAZINE-CODEINE 6.25-10 MG/5ML PO SYRP: 5.0000 mL | ORAL_SOLUTION | ORAL | 0 refills | Status: DC | PRN

## 2016-09-13 MED ORDER — AMOXICILLIN-POT CLAVULANATE 875-125 MG PO TABS: 1.0000 | ORAL_TABLET | Freq: Two times a day (BID) | ORAL | 0 refills | Status: DC

## 2016-09-13 NOTE — Progress Notes (Addendum)
Subjective:  Patient ID: Sharon Paul, female    DOB: 1946-02-05  Age: 71 y.o. MRN: [id number]  CC: No chief complaint on file.   HPI Sharon Paul presents for a wll exam  C/o GERD and L side abd pain off and on aggravated by Prilosec and Zantac. Constipation would make it worse - better w/Metamucil. C/o sinusitis sx's x 2 weeks - worse  Outpatient Medications Prior to Visit  Medication Sig Dispense Refill  . aspirin 81 MG tablet Take 81 mg by mouth daily.    . Cholecalciferol 2000 UNITS TABS Take 1 tablet by mouth daily.    [name] dicyclomine (BENTYL) 10 MG capsule Take 1 capsule (10 mg total) by mouth every 8 (eight) hours as needed for spasms. 90 capsule 1  . estradiol (CLIMARA - DOSED IN MG/24 HR) 0.025 mg/24hr patch Place 1 patch (0.025 mg total) onto the skin once a week. 12 patch 4  . FLUoxetine (PROZAC) 10 MG capsule TAKE 1 CAPSULE BY MOUTH  DAILY 90 capsule 1  . fluticasone (FLONASE) 50 MCG/ACT nasal spray PLACE 2 SPRAYS INTO THE NOSE DAILY. 16 g 2  . guaiFENesin (MUCINEX) 600 MG 12 hr tablet Take 1 tablet (600 mg total) by mouth 2 (two) times daily as needed for cough or to loosen phlegm. 14 tablet 0  . ibuprofen (ADVIL,MOTRIN) 600 MG tablet TAKE 1 TABLET (600 MG TOTAL) BY MOUTH EVERY 8 (EIGHT) HOURS AS NEEDED FOR PAIN. 90 tablet 3  . ipratropium (ATROVENT) 0.03 % nasal spray Place 2 sprays into both nostrils 2 (two) times daily. Do not use for more than 5days. 30 mL 0  . irbesartan (AVAPRO) 300 MG tablet Take 0.5 tablets (150 mg total) by mouth 2 (two) times daily. 90 tablet 3  . labetalol (NORMODYNE) 300 MG tablet TAKE 1 TABLET BY MOUTH TWO  TIMES DAILY 180 tablet 3  . LORazepam (ATIVAN) 0.5 MG tablet TAKE 1 TABLET BY MOUTH TWICE A DAY 60 tablet 3  . omeprazole (PRILOSEC) 40 MG capsule Take 1 capsule by mouth  daily 90 capsule 3  . ranitidine (ZANTAC) 150 MG tablet TAKE 1 TABLET BY MOUTH 2 TIMES DAILY 90 tablet 1  . rosuvastatin (CRESTOR) 5 MG tablet Take 1  tablet (5 mg total) by mouth daily. 90 tablet 3  . valACYclovir (VALTREX) 500 MG tablet Take twice daily for 3-5 days 30 tablet 1   No facility-administered medications prior to visit.     ROS Review of Systems  Constitutional: Negative for activity change, appetite change, chills, fatigue and unexpected weight change.  HENT: Positive for congestion, postnasal drip, rhinorrhea and sinus pain. Negative for mouth sores and sinus pressure.   Eyes: Negative for visual disturbance.  Respiratory: Negative for cough and chest tightness.   Gastrointestinal: Positive for abdominal pain and constipation. Negative for nausea.  Genitourinary: Negative for difficulty urinating, frequency and vaginal pain.  Musculoskeletal: Negative for back pain and gait problem.  Skin: Negative for pallor and rash.  Neurological: Negative for dizziness, tremors, weakness, numbness and headaches.  Psychiatric/Behavioral: Negative for confusion and sleep disturbance.   Objective:  BP (!) 170/80   Pulse (!) 58   Ht 4\' 11"  (1.499 m)   Wt 128 lb (58.1 kg)   SpO2 99%   BMI 25.85 kg/m   BP Readings from Last 3 Encounters:  09/13/16 (!) 170/80  08/19/16 124/70  07/29/16 112/70    Wt Readings from Last 3 Encounters:  09/13/16 128 lb (58.1 kg)  08/19/16  131 lb (59.4 kg)  07/29/16 131 lb (59.4 kg)    Physical Exam  Constitutional: She appears well-developed. No distress.  HENT:  Head: Normocephalic.  Right Ear: External ear normal.  Left Ear: External ear normal.  Nose: Nose normal.  Mouth/Throat: Oropharynx is clear and moist.  Eyes: Conjunctivae are normal. Pupils are equal, round, and reactive to light. Right eye exhibits no discharge. Left eye exhibits no discharge.  Neck: Normal range of motion. Neck supple. No JVD present. No tracheal deviation present. No thyromegaly present.  Cardiovascular: Normal rate, regular rhythm and normal heart sounds.   Pulmonary/Chest: No stridor. No respiratory  distress. She has no wheezes.  Abdominal: Soft. Bowel sounds are normal. She exhibits no distension and no mass. There is no tenderness. There is no rebound and no guarding.  Musculoskeletal: She exhibits no edema or tenderness.  Lymphadenopathy:    She has no cervical adenopathy.  Neurological: She displays normal reflexes. No cranial nerve deficit. She exhibits normal muscle tone. Coordination normal.  Skin: No rash noted. No erythema.  Psychiatric: She has a normal mood and affect. Her behavior is normal. Judgment and thought content normal.  Eryth nares  Lab Results  Component Value Date   WBC 3.6 (L) 09/08/2016   HGB 12.8 09/08/2016   HCT 36.9 09/08/2016   PLT 263.0 09/08/2016   GLUCOSE 100 (H) 09/08/2016   CHOL 180 09/08/2016   TRIG 103.0 09/08/2016   HDL 64.50 09/08/2016   LDLDIRECT 138.2 10/22/2010   LDLCALC 95 09/08/2016   ALT 19 09/08/2016   AST 18 09/08/2016   NA 140 09/08/2016   K 4.4 09/08/2016   CL 104 09/08/2016   CREATININE 0.82 09/08/2016   BUN 12 09/08/2016   CO2 29 09/08/2016   TSH 1.68 09/08/2016   HGBA1C 6.3 01/27/2016    Dg Abd 2 Views  Result Date: 05/31/2016 CLINICAL DATA:  Abdominal pain EXAM: ABDOMEN - 2 VIEW COMPARISON:  CT abdomen and pelvis April 21, 2006 FINDINGS: Supine and upright abdomen images obtained. There is moderate stool throughout the colon. There is no bowel dilatation or air-fluid level suggesting bowel obstruction. No free air. Visualized lung bases are clear. There is calcification in both iliac arteries. There are phleboliths in the pelvis. 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 IMPRESSION: Moderate stool in colon. No bowel obstruction or free air evident. 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 Electronically Signed   By: Lowella Grip III M.D.   On: 05/31/2016 19:49    Assessment & Plan:   There are no diagnoses linked to this encounter. I am having [name] maintain her fluticasone, aspirin, ibuprofen, Cholecalciferol,  omeprazole, valACYclovir, estradiol, dicyclomine, irbesartan, rosuvastatin, LORazepam, guaiFENesin, ipratropium, ranitidine, labetalol, and FLUoxetine.  No orders of the defined types were placed in this encounter.    Follow-up: No Follow-up on file.  Walker Kehr, MD

## 2016-09-13 NOTE — Patient Instructions (Signed)

## 2016-09-13 NOTE — Assessment & Plan Note (Signed)
Omeprazole and Zantac seem to aggravate L abd pain

## 2016-09-13 NOTE — Assessment & Plan Note (Signed)
Augmentin x 10 d for sinusitis Probiotic CT abd if needed

## 2016-09-13 NOTE — Progress Notes (Signed)
Pre visit review using our clinic review tool, if applicable. No additional management support is needed unless otherwise documented below in the visit note. 

## 2016-09-13 NOTE — Assessment & Plan Note (Signed)
Prozac, Lorazepam

## 2016-09-13 NOTE — Assessment & Plan Note (Signed)
Try Lactaid

## 2016-09-13 NOTE — Assessment & Plan Note (Signed)
Augmentin x 10 d Probiotic

## 2016-09-13 NOTE — Assessment & Plan Note (Signed)

## 2016-09-16 ENCOUNTER — Telehealth: Payer: Self-pay | Admitting: *Deleted

## 2016-09-16 MED ORDER — CEFDINIR 300 MG PO CAPS: 300.0000 mg | ORAL_CAPSULE | Freq: Two times a day (BID) | ORAL | 0 refills | Status: DC

## 2016-09-16 NOTE — Telephone Encounter (Signed)
Pls stop. Take Imodium, Probiotic Start Cefdinir - a new abx in 1-2 days Thx

## 2016-09-16 NOTE — Telephone Encounter (Signed)
Notified pt w/MD response.../lmb 

## 2016-09-16 NOTE — Telephone Encounter (Signed)
Pt left msg on triage stating the amoxicillin MD rx on Tues is giving her severe diarrhea. She states she can not take this antibiotic. Was told to call MD if sxs happen, and he would rx something else...Sharon Paul

## 2016-10-08 ENCOUNTER — Encounter (HOSPITAL_COMMUNITY): Payer: Self-pay | Admitting: Emergency Medicine

## 2016-10-08 ENCOUNTER — Ambulatory Visit (HOSPITAL_COMMUNITY)
Admission: EM | Admit: 2016-10-08 | Discharge: 2016-10-08 | Disposition: A | Discharge status: Home / Self Care | Payer: Medicare Other | Attending: Internal Medicine | Admitting: Internal Medicine

## 2016-10-08 DIAGNOSIS — R69 Illness, unspecified: Secondary | ICD-10-CM

## 2016-10-08 DIAGNOSIS — J111 Influenza due to unidentified influenza virus with other respiratory manifestations: Secondary | ICD-10-CM

## 2016-10-08 MED ORDER — OSELTAMIVIR PHOSPHATE 75 MG PO CAPS: 75.0000 mg | ORAL_CAPSULE | Freq: Two times a day (BID) | ORAL | 0 refills | Status: AC

## 2016-10-08 NOTE — ED Triage Notes (Signed)
Pain in back and across shoulders, chills, feeling very tired and felt run down.  Patient reports coughing, stuffy nose. pcp office encouraged patient to come to ucc.p 101 last night

## 2016-10-08 NOTE — ED Provider Notes (Signed)
CSN: [id number]     Arrival date & time 10/08/16  1916 History   First MD Initiated Contact with Patient 10/08/16 2133     Chief Complaint  Patient presents with  . URI   (Consider location/radiation/quality/duration/timing/severity/associated sxs/prior Treatment) Patient is a 71 y.o. Female, presents today for sudden onset starting yesterday of body aches (feeling achy on her neck, shoulder and back), fever of 100.1, chills, extreme fatigue and feels run down. 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         Past Medical History:  Diagnosis Date  . Celiac sprue 2007  . CVD (cardiovascular disease)   . Foot fracture, left 2011  . GERD (gastroesophageal reflux disease)   . Hyperlipidemia   . Osteopenia 03/2015   T score -1.8 FRAX 14%/1.3% stable from prior DEXA  . PVD (peripheral vascular disease) (HCC)    mild carotid ? fibromuscular dysplasia  . Urinary incontinence    Past Surgical History:  Procedure Laterality Date  . APPENDECTOMY    . BREAST SURGERY     right breast biopsy  . ESOPHAGOGASTRODUODENOSCOPY    . ROTATOR CUFF REPAIR     RIGHT  . stress cardiolite  04-18-00  . VAGINAL HYSTERECTOMY  1993   leiomyomata   Family History  Problem Relation Age of Onset  . Glaucoma Mother   . Heart disease Mother   . Hypertension Mother   . Heart disease Father   . Hypertension Father   . Stroke Father   . Hypertension Paternal Grandmother   . Lung cancer Paternal Grandmother   . Hypertension Sister   . Hypertension Brother   . Glaucoma Brother   . Colon cancer Neg Hx    Social History  Substance Use Topics  . Smoking status: Former Smoker    Quit date: 02/11/1970  . Smokeless tobacco: Never Used  . Alcohol use 0.0 oz/week     Comment: very rare   OB History    Gravida Para Term Preterm AB Living   3 2 2   1 2    SAB TAB Ectopic Multiple Live Births                 Review of Systems  Constitutional: Positive for  chills, fatigue and fever.  HENT: Positive for congestion and rhinorrhea. Negative for ear pain and sore throat.   Respiratory: Positive for cough. Negative for shortness of breath and wheezing.   Cardiovascular: Negative for chest pain and palpitations.  Gastrointestinal: Negative for abdominal pain, nausea and vomiting.  Musculoskeletal: Positive for myalgias.  Neurological: Positive for headaches.    Allergies  Amlodipine; Augmentin [amoxicillin-pot clavulanate]; Clarithromycin; Codeine; Esomeprazole magnesium; Hydrochlorothiazide; Iohexol; Kapidex [dexlansoprazole]; Losartan potassium; Olmesartan medoxomil; Ramipril; and Risedronate sodium  Home Medications   Prior to Admission medications   Medication Sig Start Date End Date Taking? Authorizing Provider  aspirin 81 MG tablet Take 81 mg by mouth daily.    Historical Provider, MD  cefdinir (OMNICEF) 300 MG capsule Take 1 capsule (300 mg total) by mouth 2 (two) times daily. 09/16/16   Aleksei Plotnikov V, MD  Cholecalciferol 2000 UNITS TABS Take 1 tablet by mouth daily.    Historical Provider, MD  dicyclomine (BENTYL) 10 MG capsule Take 1 capsule (10 mg total) by mouth every 8 (eight) hours as needed for spasms. 05/17/16   Manus Gunning, MD  estradiol (CLIMARA - DOSED IN MG/24 HR) 0.025 mg/24hr patch Place 1  patch (0.025 mg total) onto the skin once a week. 02/29/16   Anastasio Auerbach, MD  FLUoxetine (PROZAC) 10 MG capsule TAKE 1 CAPSULE BY MOUTH  DAILY 08/15/16   Aleksei Plotnikov V, MD  fluticasone (FLONASE) 50 MCG/ACT nasal spray PLACE 2 SPRAYS INTO THE NOSE DAILY. 02/13/13   Rowe Clack, MD  guaiFENesin (MUCINEX) 600 MG 12 hr tablet Take 1 tablet (600 mg total) by mouth 2 (two) times daily as needed for cough or to loosen phlegm. 07/29/16   Flossie Buffy, NP  ibuprofen (ADVIL,MOTRIN) 600 MG tablet TAKE 1 TABLET (600 MG TOTAL) BY MOUTH EVERY 8 (EIGHT) HOURS AS NEEDED FOR PAIN. 12/03/14   Lew Dawes V, MD   ipratropium (ATROVENT) 0.03 % nasal spray Place 2 sprays into both nostrils 2 (two) times daily. Do not use for more than 5days. 07/29/16   Flossie Buffy, NP  irbesartan (AVAPRO) 300 MG tablet Take 0.5 tablets (150 mg total) by mouth 2 (two) times daily. 06/10/16   Binnie Rail, MD  labetalol (NORMODYNE) 300 MG tablet TAKE 1 TABLET BY MOUTH TWO  TIMES DAILY 08/15/16   Aleksei Plotnikov V, MD  LORazepam (ATIVAN) 0.5 MG tablet TAKE 1 TABLET BY MOUTH TWICE A DAY 06/29/16   Aleksei Plotnikov V, MD  omeprazole (PRILOSEC) 40 MG capsule Take 1 capsule by mouth  daily 12/24/15   Lew Dawes V, MD  oseltamivir (TAMIFLU) 75 MG capsule Take 1 capsule (75 mg total) by mouth 2 (two) times daily. 10/08/16 10/13/16  Barry Dienes, NP  promethazine-codeine (PHENERGAN WITH CODEINE) 6.25-10 MG/5ML syrup Take 5 mLs by mouth every 4 (four) hours as needed. 09/13/16   Aleksei Plotnikov V, MD  ranitidine (ZANTAC) 150 MG tablet TAKE 1 TABLET BY MOUTH 2 TIMES DAILY 08/04/16   Manus Gunning, MD  rosuvastatin (CRESTOR) 5 MG tablet Take 1 tablet (5 mg total) by mouth daily. 06/10/16   Binnie Rail, MD  valACYclovir (VALTREX) 500 MG tablet Take twice daily for 3-5 days 02/05/16   Huel Cote, NP   Meds Ordered and Administered this Visit  Medications - No data to display  BP (!) 123/53 (BP Location: Right Arm)   Pulse 82   Temp 98.2 F (36.8 C) (Oral)   Resp 18   SpO2 100%  No data found.   Physical Exam  Constitutional: She is oriented to person, place, and time. She appears well-developed and well-nourished.  HENT:  Head: Normocephalic and atraumatic.  Right Ear: External ear normal.  Left Ear: External ear normal.  Nose: Nose normal.  Mouth/Throat: Oropharynx is clear and moist. No oropharyngeal exudate.  TM pearly gray with no erythema  Eyes: Pupils are equal, round, and reactive to light.  Neck: Normal range of motion.  Cardiovascular: Normal rate and regular rhythm.   No murmur  heard. Pulmonary/Chest: Effort normal and breath sounds normal. She has no wheezes.  Abdominal: Soft. Bowel sounds are normal. She exhibits no distension. There is no tenderness.  Lymphadenopathy:    She has no cervical adenopathy.  Neurological: She is alert and oriented to person, place, and time.  Skin: Skin is warm and dry.  Psychiatric: She has a normal mood and affect.  Nursing note and vitals reviewed.   Urgent Care Course     Procedures (including critical care time)  Labs Review Labs Reviewed - No data to display  Imaging Review No results found.   MDM   1. Influenza-like illness  Physical examination unremarkable but will treat empirically for influenza with Tamiflu 75 mg BID x 5 days.  A lot of rest and hydration. Continue tylenol or ibuprofen for body aches or fever.   F/u with PCP next week if no improvement is noted.    Barry Dienes, NP 10/08/16 2146

## 2016-10-11 ENCOUNTER — Telehealth: Payer: Self-pay | Admitting: Internal Medicine

## 2016-10-11 DIAGNOSIS — R0789 Other chest pain: Secondary | ICD-10-CM | POA: Diagnosis not present

## 2016-10-11 NOTE — Telephone Encounter (Signed)
Sharon Paul DOB: [birth year] Initial Comment Caller states that she has flu symptoms and a low grade fever and was prescribed Tamiflu but doesn't seem to get any better Nurse Assessment Nurse: Richardson Landry, RN, Aldona Bar Date/Time (Eastern Time): 10/11/2016 2:03:08 PM Confirm and document reason for call. If symptomatic, describe symptoms. ---Caller states yesterday she felt better but this morning her body is aching again and she has had to take ibuprofen. Caller began Tamiflu Sat night. Does the patient have any new or worsening symptoms? ---Yes Will a triage be completed? ---Yes Related visit to physician within the last 2 weeks? ---Yes Does the PT have any chronic conditions? (i.e. diabetes, asthma, etc.) ---Yes List chronic conditions. ---htn, Is this a behavioral health or substance abuse call? ---No Guidelines Guideline Title Affirmed Question Affirmed Notes Influenza Follow-up Call Patient sounds very sick or weak to the triager Final Disposition User Go to ED Now (or PCP triage) Richardson Landry, RN, Aldona Bar Referrals GO TO FACILITY UNDECIDED Disagree/Comply: Comply

## 2016-11-06 ENCOUNTER — Other Ambulatory Visit: Payer: Self-pay | Admitting: Gastroenterology

## 2016-11-08 ENCOUNTER — Ambulatory Visit: Payer: Medicare Other | Admitting: Internal Medicine

## 2016-11-09 ENCOUNTER — Telehealth: Payer: Self-pay | Admitting: Gastroenterology

## 2016-11-09 ENCOUNTER — Telehealth: Payer: Self-pay | Admitting: Internal Medicine

## 2016-11-09 DIAGNOSIS — E785 Hyperlipidemia, unspecified: Secondary | ICD-10-CM

## 2016-11-09 MED ORDER — ATORVASTATIN CALCIUM 10 MG PO TABS: 10.0000 mg | ORAL_TABLET | Freq: Every day | ORAL | 11 refills | Status: DC

## 2016-11-09 NOTE — Telephone Encounter (Signed)
Routing to dr plotnikov, please advise, I will call patient back, thanks 

## 2016-11-09 NOTE — Telephone Encounter (Signed)
We can try Atorvastatin 10 mg/d Lipids, LFT, BMET in 6 wks Thx

## 2016-11-09 NOTE — Telephone Encounter (Signed)
Pt walked in and stated her Ins is not covering crestor. She has talked to her insurance for different options, Leoastatin(40mg ), Sineastatin(40mg ), Aloreastatin(20), would like to know what Plotnikov thinks is the best option for her. Please advise.  SB

## 2016-11-09 NOTE — Telephone Encounter (Signed)
She is referring to Librax which has become a more restricted medication and I don't routinely prescribe this. Further, it is similar to the ativan she is already taking. I don't think it a good idea she takes this. She can use the ativan she already has from her primary care. How much bentyl is she taking? If she is taking 20mg  at a time she should reduce to 10mg  . Thanks

## 2016-11-10 ENCOUNTER — Other Ambulatory Visit: Payer: Self-pay

## 2016-11-10 MED ORDER — ATORVASTATIN CALCIUM 10 MG PO TABS: 10.0000 mg | ORAL_TABLET | Freq: Every day | ORAL | 11 refills | Status: DC

## 2016-11-10 NOTE — Telephone Encounter (Signed)
I resent rx to [facility] per patient request, I have also entered lab orders and advised patient to come for labs in 6 weeks

## 2016-11-11 NOTE — Telephone Encounter (Signed)
Patient is calling back regarding this wanting to speak with nurse. Best call back number is [phone].

## 2016-11-11 NOTE — Telephone Encounter (Signed)
Pt is taking 10mg  of Bentyl. It is making her dizzy and she is afraid to drive and has a fear of falling. Is there something else for abdominal spasms? She is aware that she cannot take the Librax that is similar to her Ativan.

## 2016-11-11 NOTE — Telephone Encounter (Signed)
Left message for pt to return call.

## 2016-11-14 NOTE — Telephone Encounter (Signed)
She can try some IB gard which is over the counter if she has not tried this yet. It is safe and I think she will tolerate it okay. If this doesn't help she should make a follow up appointment in the clinic. thanks

## 2016-11-14 NOTE — Telephone Encounter (Signed)
Left message for pt to return call.

## 2016-11-16 ENCOUNTER — Encounter: Payer: Self-pay | Admitting: Internal Medicine

## 2016-11-16 ENCOUNTER — Ambulatory Visit (INDEPENDENT_AMBULATORY_CARE_PROVIDER_SITE_OTHER): Payer: Medicare Other | Admitting: Internal Medicine

## 2016-11-16 ENCOUNTER — Other Ambulatory Visit (INDEPENDENT_AMBULATORY_CARE_PROVIDER_SITE_OTHER): Payer: Medicare Other

## 2016-11-16 ENCOUNTER — Telehealth: Payer: Self-pay | Admitting: Internal Medicine

## 2016-11-16 VITALS — BP 122/88 | HR 60 | Temp 97.7°F | Wt 129.0 lb

## 2016-11-16 DIAGNOSIS — R350 Frequency of micturition: Secondary | ICD-10-CM | POA: Diagnosis not present

## 2016-11-16 DIAGNOSIS — E785 Hyperlipidemia, unspecified: Secondary | ICD-10-CM

## 2016-11-16 DIAGNOSIS — I10 Essential (primary) hypertension: Secondary | ICD-10-CM | POA: Diagnosis not present

## 2016-11-16 DIAGNOSIS — M545 Low back pain: Secondary | ICD-10-CM

## 2016-11-16 LAB — URINALYSIS, ROUTINE W REFLEX MICROSCOPIC
Bilirubin Urine: NEGATIVE
HGB URINE DIPSTICK: NEGATIVE
Ketones, ur: NEGATIVE
Leukocytes, UA: NEGATIVE
Nitrite: NEGATIVE
PH: 6 (ref 5.0–8.0)
Specific Gravity, Urine: <=1.005 — AB (ref 1.000–1.030)
TOTAL PROTEIN, URINE-UPE24: NEGATIVE
Urine Glucose: NEGATIVE
Urobilinogen, UA: 0.2 (ref 0.0–1.0)

## 2016-11-16 LAB — BASIC METABOLIC PANEL
BUN: 15 mg/dL (ref 6–23)
CALCIUM: 9.8 mg/dL (ref 8.4–10.5)
CO2: 26 mEq/L (ref 19–32)
Chloride: 106 mEq/L (ref 96–112)
Creatinine, Ser: 0.8 mg/dL (ref 0.40–1.20)
GFR: 75.35 mL/min (ref >60.00)
GLUCOSE: 84 mg/dL (ref 70–99)
Potassium: 4.2 mEq/L (ref 3.5–5.1)
Sodium: 138 mEq/L (ref 135–145)

## 2016-11-16 LAB — HEPATIC FUNCTION PANEL
ALT: 16 U/L (ref 0–35)
AST: 16 U/L (ref 0–37)
Albumin: 4.3 g/dL (ref 3.5–5.2)
Alkaline Phosphatase: 75 U/L (ref 39–117)
BILIRUBIN DIRECT: 0.1 mg/dL (ref 0.0–0.3)
BILIRUBIN TOTAL: 0.3 mg/dL (ref 0.2–1.2)
Total Protein: 6.6 g/dL (ref 6.0–8.3)

## 2016-11-16 LAB — LIPID PANEL
CHOLESTEROL: 181 mg/dL (ref 0–200)
HDL: 55.9 mg/dL (ref >39.00)
LDL Cholesterol: 87 mg/dL (ref 0–99)
NonHDL: 124.77
Total CHOL/HDL Ratio: 3
Triglycerides: 190 mg/dL — ABNORMAL HIGH (ref 0.0–149.0)
VLDL: 38 mg/dL (ref 0.0–40.0)

## 2016-11-16 MED ORDER — TIZANIDINE HCL 4 MG PO TABS: 4.0000 mg | ORAL_TABLET | Freq: Four times a day (QID) | ORAL | 1 refills | Status: DC | PRN

## 2016-11-16 MED ORDER — LEVOFLOXACIN 250 MG PO TABS: 250.0000 mg | ORAL_TABLET | Freq: Every day | ORAL | 0 refills | Status: DC

## 2016-11-16 NOTE — Progress Notes (Signed)
Pre visit review using our clinic review tool, if applicable. No additional management support is needed unless otherwise documented below in the visit note. 

## 2016-11-16 NOTE — Patient Instructions (Signed)
Please take all new medication as prescribed - the antibiotic, and the muscle relaxer  Please continue all other medications as before, and refills have been done if requested.  Please have the pharmacy call with any other refills you may need.  Please keep your appointments with your specialists as you may have planned  Please go to the LAB in the Basement (turn left off the elevator) for the tests to be done today - just the urine testing  You will be contacted by phone if any changes need to be made immediately.  Otherwise, you will receive a letter about your results with an explanation, but please check with MyChart first.

## 2016-11-16 NOTE — Telephone Encounter (Signed)
Patient was seen today.  Her scripts were sent to an incorrect pharmacy.  Patient states scripts were sent to [facility].  This pharmacy was added on a visit she had made to the ED.  Patient is requesting this pharmacy to be taken off her chart.  Patient needs her medications sent to [facility] [address] today.  This also should be patients main pharmacy on file.  Please follow up with patient once sent.

## 2016-11-16 NOTE — Progress Notes (Signed)
Subjective:    Patient ID: Sharon Paul, female    DOB: [birth year], 71 y.o.   MRN: [id number]  HPI  Here with c/o 2 days onset urinary symptoms such as dysuria, frequency, but no urgency, flank pain, hematuria or n/v, fever, chills.  Also Pt continues to have recurring left LBP without bowel or bladder change, fever, wt loss,  worsening LE pain/numbness/weakness, gait change or falls. Worse to bend or stand up,  Pt denies chest pain, increased sob or doe, wheezing, orthopnea, PND, increased LE swelling, palpitations, dizziness or syncope.  Pt denies new neurological symptoms such as new headache, or facial or extremity weakness or numbness Past Medical History:  Diagnosis Date  . Celiac sprue 2007  . CVD (cardiovascular disease)   . Foot fracture, left 2011  . GERD (gastroesophageal reflux disease)   . Hyperlipidemia   . Osteopenia 03/2015   T score -1.8 FRAX 14%/1.3% stable from prior DEXA  . PVD (peripheral vascular disease) (HCC)    mild carotid ? fibromuscular dysplasia  . Urinary incontinence    Past Surgical History:  Procedure Laterality Date  . APPENDECTOMY    . BREAST SURGERY     right breast biopsy  . ESOPHAGOGASTRODUODENOSCOPY    . ROTATOR CUFF REPAIR     RIGHT  . stress cardiolite  04-18-00  . 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 She has never used smokeless tobacco. She reports that she drinks alcohol. She reports that she does not use drugs. family history includes Glaucoma in her brother and mother; Heart disease in her father and mother; Hypertension in her brother, father, mother, paternal grandmother, and sister; Lung cancer in her paternal grandmother; Stroke in her father. Allergies  Allergen Reactions  . Amlodipine     HA  . Augmentin [Amoxicillin-Pot Clavulanate]     diarrhea  . Clarithromycin     REACTION: gi upset  . Codeine Nausea And Vomiting  . Esomeprazole Magnesium     REACTION:  side pain  . Hydrochlorothiazide     REACTION: leg cramps, dehydration  . Iohexol      Desc: PT HAS SWELLING TO FACE,LIPS AND THROAT WITH CONTRAST DYE   . Kapidex [Dexlansoprazole]     rash  . Losartan Potassium     REACTION: HA  . Olmesartan Medoxomil     REACTION: weak legs  . Ramipril     Per pt: unknown reaction  . Risedronate Sodium     Per pt: unknown reaction   Current Outpatient Prescriptions on File Prior to Visit  Medication Sig Dispense Refill  . aspirin 81 MG tablet Take 81 mg by mouth daily.    . Cholecalciferol 2000 UNITS TABS Take 1 tablet by mouth daily.    [name] dicyclomine (BENTYL) 10 MG capsule Take 1 capsule (10 mg total) by mouth every 8 (eight) hours as needed for spasms. 90 capsule 1  . estradiol (CLIMARA - DOSED IN MG/24 HR) 0.025 mg/24hr patch Place 1 patch (0.025 mg total) onto the skin once a week. 12 patch 4  . FLUoxetine (PROZAC) 10 MG capsule TAKE 1 CAPSULE BY MOUTH  DAILY 90 capsule 1  . fluticasone (FLONASE) 50 MCG/ACT nasal spray PLACE 2 SPRAYS INTO THE NOSE DAILY. 16 g 2  . guaiFENesin (MUCINEX) 600 MG 12 hr tablet Take 1 tablet (600 mg total) by mouth 2 (two) times daily as needed for cough or to loosen phlegm. [place]  tablet 0  . ibuprofen (ADVIL,MOTRIN) 600 MG tablet TAKE 1 TABLET (600 MG TOTAL) BY MOUTH EVERY 8 (EIGHT) HOURS AS NEEDED FOR PAIN. 90 tablet 3  . ipratropium (ATROVENT) 0.03 % nasal spray Place 2 sprays into both nostrils 2 (two) times daily. Do not use for more than 5days. 30 mL 0  . irbesartan (AVAPRO) 300 MG tablet Take 0.5 tablets (150 mg total) by mouth 2 (two) times daily. 90 tablet 3  . labetalol (NORMODYNE) 300 MG tablet TAKE 1 TABLET BY MOUTH TWO  TIMES DAILY 180 tablet 3  . LORazepam (ATIVAN) 0.5 MG tablet TAKE 1 TABLET BY MOUTH TWICE A DAY 60 tablet 3  . omeprazole (PRILOSEC) 40 MG capsule Take 1 capsule by mouth  daily 90 capsule 3  . promethazine-codeine (PHENERGAN WITH CODEINE) 6.25-10 MG/5ML syrup Take 5 mLs by mouth every 4  (four) hours as needed. 300 mL 0  . ranitidine (ZANTAC) 150 MG tablet TAKE 1 TABLET BY MOUTH 2 TIMES DAILY 90 tablet 3  . valACYclovir (VALTREX) 500 MG tablet Take twice daily for 3-5 days 30 tablet 1   No current facility-administered medications on file prior to visit.    Review of Systems   Constitutional: Negative for unusual diaphoresis or night sweats HENT: Negative for ear swelling or discharge Eyes: Negative for worsening visual haziness  Respiratory: Negative for choking and stridor.   Gastrointestinal: Negative for distension or worsening eructation Genitourinary: Negative for retention or change in urine volume.  Musculoskeletal: Negative for other MSK pain or swelling Skin: Negative for color change and worsening wound Neurological: Negative for tremors and numbness other than noted  Psychiatric/Behavioral: Negative for decreased concentration or agitation other than above   All otherwise neg per pt    Objective:   Physical Exam BP 122/88   Pulse 60   Temp 97.7 F (36.5 C) (Oral)   Wt 129 lb (58.5 kg)   SpO2 98%   BMI 26.05 kg/m  VS noted,  Constitutional: Pt appears in no apparent distress HENT: Head: NCAT.  Right Ear: External ear normal.  Left Ear: External ear normal.  Eyes: . Pupils are equal, round, and reactive to light. Conjunctivae and EOM are normal Neck: Normal range of motion. Neck supple.  Cardiovascular: Normal rate and regular rhythm.   Pulmonary/Chest: Effort normal and breath sounds without rales or wheezing.  Abd:  Soft, ND, + BS with low mid abd tender Spine: nontender, but has tender left paraspinal tender muscle spasm Neurological: Pt is alert. Not confused , motor grossly intact Skin: Skin is warm. No rash, no LE edema Psychiatric: Pt behavior is normal. No agitation.  No other exam findings    Assessment & Plan:

## 2016-11-17 ENCOUNTER — Other Ambulatory Visit: Payer: Self-pay

## 2016-11-17 LAB — URINE CULTURE

## 2016-11-17 MED ORDER — TIZANIDINE HCL 4 MG PO TABS: 4.0000 mg | ORAL_TABLET | Freq: Four times a day (QID) | ORAL | 1 refills | Status: DC | PRN

## 2016-11-17 MED ORDER — LEVOFLOXACIN 250 MG PO TABS: 250.0000 mg | ORAL_TABLET | Freq: Every day | ORAL | 0 refills | Status: AC

## 2016-11-17 NOTE — Telephone Encounter (Signed)
Patient advised both meds resent to [facility]

## 2016-11-18 NOTE — Telephone Encounter (Signed)
Left message for pt to return call. I will mail letter to pt stating that she may try IBGard if she wishes and will send coupon as well. Will inform in letter to call for an appt if IBGard does no thelp.

## 2016-11-20 DIAGNOSIS — R350 Frequency of micturition: Secondary | ICD-10-CM | POA: Insufficient documentation

## 2016-11-20 NOTE — Assessment & Plan Note (Signed)
Mild, c/w msk spasm, for tizanidine prn,  to f/u any worsening symptoms or concerns

## 2016-11-20 NOTE — Assessment & Plan Note (Signed)
Mild to mod, probable cystitis, for urine studies, empiric antibx,  to f/u any worsening symptoms or concerns

## 2016-11-20 NOTE — Assessment & Plan Note (Signed)
BP Readings from Last 3 Encounters:  11/16/16 122/88  10/08/16 (!) 123/53  09/13/16 130/80   stable overall by history and exam, recent data reviewed with pt, and pt to continue medical treatment as before,  to f/u any worsening symptoms or concerns

## 2016-12-07 DIAGNOSIS — M25571 Pain in right ankle and joints of right foot: Secondary | ICD-10-CM | POA: Diagnosis not present

## 2016-12-09 ENCOUNTER — Ambulatory Visit (INDEPENDENT_AMBULATORY_CARE_PROVIDER_SITE_OTHER): Payer: Medicare Other | Admitting: Internal Medicine

## 2016-12-09 ENCOUNTER — Encounter: Payer: Self-pay | Admitting: Internal Medicine

## 2016-12-09 DIAGNOSIS — F411 Generalized anxiety disorder: Secondary | ICD-10-CM

## 2016-12-09 DIAGNOSIS — E785 Hyperlipidemia, unspecified: Secondary | ICD-10-CM | POA: Diagnosis not present

## 2016-12-09 DIAGNOSIS — E559 Vitamin D deficiency, unspecified: Secondary | ICD-10-CM

## 2016-12-09 DIAGNOSIS — I10 Essential (primary) hypertension: Secondary | ICD-10-CM | POA: Diagnosis not present

## 2016-12-09 MED ORDER — CRESTOR 10 MG PO TABS: 10.0000 mg | ORAL_TABLET | Freq: Every day | ORAL | 3 refills | Status: DC

## 2016-12-09 MED ORDER — IRBESARTAN 300 MG PO TABS: 150.0000 mg | ORAL_TABLET | Freq: Two times a day (BID) | ORAL | 3 refills | Status: DC

## 2016-12-09 NOTE — Progress Notes (Signed)
Pre visit review using our clinic review tool, if applicable. No additional management support is needed unless otherwise documented below in the visit note. 

## 2016-12-09 NOTE — Progress Notes (Signed)
Subjective:  Patient ID: Sharon Paul, female    DOB: 19-Sep-1945  Age: 71 y.o. MRN: [id number]  CC: No chief complaint on file.   HPI Sebastian Dzik presents for HTN, dyslipidemia, anxiety f/u  Outpatient Medications Prior to Visit  Medication Sig Dispense Refill  . aspirin 81 MG tablet Take 81 mg by mouth daily.    . Cholecalciferol 2000 UNITS TABS Take 1 tablet by mouth daily.    [name] dicyclomine (BENTYL) 10 MG capsule Take 1 capsule (10 mg total) by mouth every 8 (eight) hours as needed for spasms. 90 capsule 1  . estradiol (CLIMARA - DOSED IN MG/24 HR) 0.025 mg/24hr patch Place 1 patch (0.025 mg total) onto the skin once a week. 12 patch 4  . FLUoxetine (PROZAC) 10 MG capsule TAKE 1 CAPSULE BY MOUTH  DAILY 90 capsule 1  . fluticasone (FLONASE) 50 MCG/ACT nasal spray PLACE 2 SPRAYS INTO THE NOSE DAILY. 16 g 2  . ibuprofen (ADVIL,MOTRIN) 600 MG tablet TAKE 1 TABLET (600 MG TOTAL) BY MOUTH EVERY 8 (EIGHT) HOURS AS NEEDED FOR PAIN. 90 tablet 3  . ipratropium (ATROVENT) 0.03 % nasal spray Place 2 sprays into both nostrils 2 (two) times daily. Do not use for more than 5days. 30 mL 0  . irbesartan (AVAPRO) 300 MG tablet Take 0.5 tablets (150 mg total) by mouth 2 (two) times daily. 90 tablet 3  . labetalol (NORMODYNE) 300 MG tablet TAKE 1 TABLET BY MOUTH TWO  TIMES DAILY 180 tablet 3  . LORazepam (ATIVAN) 0.5 MG tablet TAKE 1 TABLET BY MOUTH TWICE A DAY 60 tablet 3  . omeprazole (PRILOSEC) 40 MG capsule Take 1 capsule by mouth  daily 90 capsule 3  . ranitidine (ZANTAC) 150 MG tablet TAKE 1 TABLET BY MOUTH 2 TIMES DAILY 90 tablet 3  . tiZANidine (ZANAFLEX) 4 MG tablet Take 1 tablet (4 mg total) by mouth every 6 (six) hours as needed for muscle spasms. 30 tablet 1  . valACYclovir (VALTREX) 500 MG tablet Take twice daily for 3-5 days 30 tablet 1  . guaiFENesin (MUCINEX) 600 MG 12 hr tablet Take 1 tablet (600 mg total) by mouth 2 (two) times daily as needed for cough or to loosen  phlegm. 14 tablet 0  . promethazine-codeine (PHENERGAN WITH CODEINE) 6.25-10 MG/5ML syrup Take 5 mLs by mouth every 4 (four) hours as needed. 300 mL 0   No facility-administered medications prior to visit.     ROS Review of Systems  Constitutional: Negative for activity change, appetite change, chills, fatigue and unexpected weight change.  HENT: Negative for congestion, mouth sores and sinus pressure.   Eyes: Negative for visual disturbance.  Respiratory: Negative for cough and chest tightness.   Gastrointestinal: Negative for abdominal pain and nausea.  Genitourinary: Negative for difficulty urinating, frequency and vaginal pain.  Musculoskeletal: Negative for back pain and gait problem.  Skin: Negative for pallor and rash.  Neurological: Negative for dizziness, tremors, weakness, numbness and headaches.  Psychiatric/Behavioral: Negative for confusion and sleep disturbance. The patient is not nervous/anxious.     Objective:  BP (!) 144/80   Pulse 60   Temp 98 F (36.7 C) (Oral)   Resp 16   Wt 130 lb (59 kg)   SpO2 98%   BMI 26.26 kg/m   BP Readings from Last 3 Encounters:  12/09/16 (!) 144/80  11/16/16 122/88  10/08/16 (!) 123/53    Wt Readings from Last 3 Encounters:  12/09/16 130 lb (59 kg)  11/16/16 129 lb (58.5 kg)  09/13/16 128 lb (58.1 kg)    Physical Exam  Constitutional: She appears well-developed. No distress.  HENT:  Head: Normocephalic.  Right Ear: External ear normal.  Left Ear: External ear normal.  Nose: Nose normal.  Mouth/Throat: Oropharynx is clear and moist.  Eyes: Conjunctivae are normal. Pupils are equal, round, and reactive to light. Right eye exhibits no discharge. Left eye exhibits no discharge.  Neck: Normal range of motion. Neck supple. No JVD present. No tracheal deviation present. No thyromegaly present.  Cardiovascular: Normal rate, regular rhythm and normal heart sounds.   Pulmonary/Chest: No stridor. No respiratory distress. She  has no wheezes.  Abdominal: Soft. Bowel sounds are normal. She exhibits no distension and no mass. There is no tenderness. There is no rebound and no guarding.  Musculoskeletal: She exhibits no edema or tenderness.  Lymphadenopathy:    She has no cervical adenopathy.  Neurological: She displays normal reflexes. No cranial nerve deficit. She exhibits normal muscle tone. Coordination normal.  Skin: No rash noted. No erythema.  Psychiatric: She has a normal mood and affect. Her behavior is normal. Judgment and thought content normal.    Lab Results  Component Value Date   WBC 3.6 (L) 09/08/2016   HGB 12.8 09/08/2016   HCT 36.9 09/08/2016   PLT 263.0 09/08/2016   GLUCOSE 84 11/16/2016   CHOL 181 11/16/2016   TRIG 190.0 (H) 11/16/2016   HDL 55.90 11/16/2016   LDLDIRECT 138.2 10/22/2010   LDLCALC 87 11/16/2016   ALT 16 11/16/2016   AST 16 11/16/2016   NA 138 11/16/2016   K 4.2 11/16/2016   CL 106 11/16/2016   CREATININE 0.80 11/16/2016   BUN 15 11/16/2016   CO2 26 11/16/2016   TSH 1.68 09/08/2016   HGBA1C 6.3 01/27/2016    No results found.  Assessment & Plan:   There are no diagnoses linked to this encounter. I have discontinued [name]'s guaiFENesin and promethazine-codeine. I am also having her maintain her fluticasone, aspirin, ibuprofen, Cholecalciferol, omeprazole, valACYclovir, estradiol, dicyclomine, irbesartan, LORazepam, ipratropium, labetalol, FLUoxetine, ranitidine, tiZANidine, and atorvastatin.  Meds ordered this encounter  Medications  . atorvastatin (LIPITOR) 10 MG tablet    Sig: Take 10 mg by mouth daily.     Follow-up: No Follow-up on file.  Walker Kehr, MD

## 2016-12-09 NOTE — Assessment & Plan Note (Signed)
-   Crestor 

## 2016-12-09 NOTE — Assessment & Plan Note (Signed)
On HCTZ 

## 2016-12-09 NOTE — Assessment & Plan Note (Signed)
On Lorazepam prn 

## 2016-12-09 NOTE — Assessment & Plan Note (Signed)
On Vit D 

## 2016-12-26 ENCOUNTER — Other Ambulatory Visit: Payer: Self-pay | Admitting: Internal Medicine

## 2017-01-11 ENCOUNTER — Encounter: Payer: Self-pay | Admitting: Gynecology

## 2017-02-01 ENCOUNTER — Other Ambulatory Visit: Payer: Self-pay | Admitting: *Deleted

## 2017-02-01 MED ORDER — CRESTOR 10 MG PO TABS: 10.0000 mg | ORAL_TABLET | Freq: Every day | ORAL | 3 refills | Status: DC

## 2017-02-02 ENCOUNTER — Telehealth: Payer: Self-pay | Admitting: Internal Medicine

## 2017-02-02 NOTE — Telephone Encounter (Signed)
Pt would like her crestor called in for generic, generic is covered by her insurance.   [facility]

## 2017-02-03 MED ORDER — ROSUVASTATIN CALCIUM 10 MG PO TABS: 10.0000 mg | ORAL_TABLET | Freq: Every day | ORAL | 3 refills | Status: DC

## 2017-02-03 NOTE — Telephone Encounter (Signed)
RX sent

## 2017-03-06 ENCOUNTER — Ambulatory Visit (INDEPENDENT_AMBULATORY_CARE_PROVIDER_SITE_OTHER): Payer: Medicare Other | Admitting: Gynecology

## 2017-03-06 ENCOUNTER — Encounter: Payer: Self-pay | Admitting: Gynecology

## 2017-03-06 VITALS — BP 120/78 | Ht <= 58 in | Wt 130.0 lb

## 2017-03-06 DIAGNOSIS — N952 Postmenopausal atrophic vaginitis: Secondary | ICD-10-CM

## 2017-03-06 DIAGNOSIS — M8580 Other specified disorders of bone density and structure, unspecified site: Secondary | ICD-10-CM

## 2017-03-06 DIAGNOSIS — Z79890 Hormone replacement therapy (postmenopausal): Secondary | ICD-10-CM

## 2017-03-06 DIAGNOSIS — Z01411 Encounter for gynecological examination (general) (routine) with abnormal findings: Secondary | ICD-10-CM

## 2017-03-06 DIAGNOSIS — N3946 Mixed incontinence: Secondary | ICD-10-CM

## 2017-03-06 MED ORDER — ESTRADIOL 0.025 MG/24HR TD PTWK: 0.0250 mg | MEDICATED_PATCH | TRANSDERMAL | 4 refills | Status: DC

## 2017-03-06 NOTE — Progress Notes (Signed)
Sharon Paul 1945-10-07 [id number]        71 y.o.  [id number] for annual exam.  Having worsening symptoms of urinary incontinence. Has a mixed pattern to include loss of urine with coughing sneezing laughing and particularly when her bladder gets full. Also having urgency incontinence where she gets close to the bathroom she urgently has to go and sometimes has incontinence with this.  Past medical history,surgical history, problem list, medications, allergies, family history and social history were all reviewed and documented as reviewed in the EPIC chart.  ROS:  Performed with pertinent positives and negatives included in the history, assessment and plan.   Additional significant findings :  None   Exam: Sharon Paul assistant Vitals:   03/06/17 1414  BP: 120/78  Weight: 130 lb (59 kg)  Height: 4\' 10"  (1.473 m)   Body mass index is 27.17 kg/m.  General appearance:  Normal affect, orientation and appearance. Skin: Grossly normal HEENT: Without gross lesions.  No cervical or supraclavicular adenopathy. Thyroid normal.  Lungs:  Clear without wheezing, rales or rhonchi Cardiac: RR, without RMG Abdominal:  Soft, nontender, without masses, guarding, rebound, organomegaly or hernia Breasts:  Examined lying and sitting without masses, retractions, discharge or axillary adenopathy. Pelvic:  Ext, BUS, Vagina: With atrophic changes  Adnexa: Without masses or tenderness    Anus and perineum: Normal   Rectovaginal: Normal sphincter tone without palpated masses or tenderness.    Assessment/Plan:  71 y.o. [id number] female for annual exam.   1. Postmenopausal/HRT. 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 Continues on one half of [place] 0.025 mg patch weekly. I reviewed the issues of HRT with her particularly as she is now 3. 2017 NAMS guidelines discussed. Risks of thrombosis including stroke heart attack DVT and possible increased risks with age reviewed. Benefits as  far as symptom relief cardiovascular and bone health when started early also discussed. She has tried stopping and had issues with sleeping. After a lengthy discussion the patient wants to continue and refill 1 year provided. 2. Urinary incontinence. Patient has a mixed incontinence with both stress and urge symptoms. I reviewed options again with her to include behavior modification as well as urology referral for further evaluation and possible treatment. Use of OAB medications versus surgery discussed. Patient is at the point now where she wants to follow up with the urologist and will help her make this appointment. She knows to call me if she does not hear from them within 2 weeks. 3. Osteopenia. DEXA 2016 T score -1.8 FRAX 14%/1.3%. Recommend follow up DEXA now and patient is going to schedule. Is having blood work done at Dr. Alain Paul in several weeks have asked her to make sure he does a vitamin D level and she agrees to ask. 4. Colonoscopy 2014. Repeat at their recommended interval. 5. Pap smear 2012. No Pap smear done today. No history of significant abnormal Pap smears. Reviewed current screening guidelines and we both feel comfortable stop screening based on age and hysterectomy history. 6. Mammography 04/2016. Continue with annual mammography when due. SBE monthly reviewed. Breast exam normal today. 7. Health maintenance. No routine lab work done as patient reports is done elsewhere. Follow up with the urologist. Follow up for bone density. Follow up in one year for annual exam.  Additional time in excess of her breast and pelvic exam was spent in direct face to face counseling and coordination of care in regards to her urinary incontinence and her HRT.  Sharon Auerbach MD, 2:48 PM 03/06/2017

## 2017-03-06 NOTE — Patient Instructions (Signed)
Office will call you to arrange an appointment to see [facility] in reference to the urinary incontinence.  Follow up for the bone density as scheduled.

## 2017-03-07 ENCOUNTER — Telehealth: Payer: Self-pay | Admitting: *Deleted

## 2017-03-07 LAB — URINALYSIS W MICROSCOPIC + REFLEX CULTURE
BACTERIA UA: NONE SEEN [HPF]
Bilirubin Urine: NEGATIVE
CASTS: NONE SEEN [LPF]
CRYSTALS: NONE SEEN [HPF]
Glucose, UA: NEGATIVE
HGB URINE DIPSTICK: NEGATIVE
Ketones, ur: NEGATIVE
Leukocytes, UA: NEGATIVE
Nitrite: NEGATIVE
PROTEIN: NEGATIVE
RBC / HPF: NONE SEEN RBC/HPF (ref <=2)
SQUAMOUS EPITHELIAL / LPF: NONE SEEN [HPF] (ref <=5)
Specific Gravity, Urine: 1.005 (ref 1.001–1.035)
WBC, UA: NONE SEEN WBC/HPF (ref <=5)
Yeast: NONE SEEN [HPF]
pH: 7 (ref 5.0–8.0)

## 2017-03-07 NOTE — Telephone Encounter (Signed)
-----   Message from Anastasio Auerbach, MD sent at 03/07/2017  7:50 AM EDT ----- Arrange urology appointment reference urinary incontinence

## 2017-03-07 NOTE — Telephone Encounter (Signed)
Referral faxed to [facility] they will fax me back with time and date to relay to patient.

## 2017-03-15 ENCOUNTER — Other Ambulatory Visit (INDEPENDENT_AMBULATORY_CARE_PROVIDER_SITE_OTHER): Payer: Medicare Other

## 2017-03-15 ENCOUNTER — Telehealth: Payer: Self-pay

## 2017-03-15 DIAGNOSIS — E785 Hyperlipidemia, unspecified: Secondary | ICD-10-CM

## 2017-03-15 DIAGNOSIS — R739 Hyperglycemia, unspecified: Secondary | ICD-10-CM

## 2017-03-15 LAB — LIPID PANEL
Cholesterol: 173 mg/dL (ref 0–200)
HDL: 57.6 mg/dL (ref >39.00)
LDL Cholesterol: 84 mg/dL (ref 0–99)
NonHDL: 115.63
Total CHOL/HDL Ratio: 3
Triglycerides: 157 mg/dL — ABNORMAL HIGH (ref 0.0–149.0)
VLDL: 31.4 mg/dL (ref 0.0–40.0)

## 2017-03-15 LAB — HEMOGLOBIN A1C: Hgb A1c MFr Bld: 6.1 % (ref 4.6–6.5)

## 2017-03-15 NOTE — Telephone Encounter (Signed)
Patient came in and stated she needed a lipid and A1c put in before her appointment coming up. Orders entered

## 2017-03-21 ENCOUNTER — Encounter: Payer: Self-pay | Admitting: Internal Medicine

## 2017-03-21 ENCOUNTER — Ambulatory Visit (INDEPENDENT_AMBULATORY_CARE_PROVIDER_SITE_OTHER): Payer: Medicare Other | Admitting: Internal Medicine

## 2017-03-21 VITALS — BP 122/82 | HR 78 | Temp 97.8°F | Ht <= 58 in | Wt 131.0 lb

## 2017-03-21 DIAGNOSIS — I10 Essential (primary) hypertension: Secondary | ICD-10-CM | POA: Diagnosis not present

## 2017-03-21 DIAGNOSIS — M19041 Primary osteoarthritis, right hand: Secondary | ICD-10-CM | POA: Diagnosis not present

## 2017-03-21 DIAGNOSIS — F411 Generalized anxiety disorder: Secondary | ICD-10-CM

## 2017-03-21 DIAGNOSIS — I739 Peripheral vascular disease, unspecified: Secondary | ICD-10-CM | POA: Diagnosis not present

## 2017-03-21 NOTE — Assessment & Plan Note (Addendum)
ASA, Crestor Carot doppler US

## 2017-03-21 NOTE — Assessment & Plan Note (Signed)
Labetalol, Irbesartan, HCTZ

## 2017-03-21 NOTE — Telephone Encounter (Signed)
Pt scheduled on 05/15/17 ! 1:15pm with Dr.MacDiarmid pt informed by [facility]

## 2017-03-21 NOTE — Assessment & Plan Note (Addendum)
Turmeric Hold Crestor x2-3 weeks

## 2017-03-21 NOTE — Progress Notes (Signed)
Subjective:  Patient ID: Sharon Paul, female    DOB: May 21, 1946  Age: 71 y.o. MRN: [id number]  CC: No chief complaint on file.   HPI Sharon Paul presents for HTN, depression, dyslipidemia C/o finger joints pain, stiffness in am C/o urinary issues - will see a urologist in Sept  Outpatient Medications Prior to Visit  Medication Sig Dispense Refill  . aspirin 81 MG tablet Take 81 mg by mouth daily.    . Cholecalciferol 2000 UNITS TABS Take 1 tablet by mouth daily.    [name] estradiol (CLIMARA - DOSED IN MG/24 HR) 0.025 mg/24hr patch Place 1 patch (0.025 mg total) onto the skin once a week. 12 patch 4  . FLUoxetine (PROZAC) 10 MG capsule TAKE 1 CAPSULE BY MOUTH  DAILY 90 capsule 2  . fluticasone (FLONASE) 50 MCG/ACT nasal spray PLACE 2 SPRAYS INTO THE NOSE DAILY. 16 g 2  . ibuprofen (ADVIL,MOTRIN) 600 MG tablet TAKE 1 TABLET (600 MG TOTAL) BY MOUTH EVERY 8 (EIGHT) HOURS AS NEEDED FOR PAIN. 90 tablet 3  . ipratropium (ATROVENT) 0.03 % nasal spray Place 2 sprays into both nostrils 2 (two) times daily. Do not use for more than 5days. 30 mL 0  . irbesartan (AVAPRO) 300 MG tablet Take 0.5 tablets (150 mg total) by mouth 2 (two) times daily. 90 tablet 3  . labetalol (NORMODYNE) 300 MG tablet TAKE 1 TABLET BY MOUTH TWO  TIMES DAILY 180 tablet 3  . LORazepam (ATIVAN) 0.5 MG tablet TAKE 1 TABLET BY MOUTH TWICE A DAY 60 tablet 3  . omeprazole (PRILOSEC) 40 MG capsule Take 1 capsule by mouth  daily 90 capsule 3  . ranitidine (ZANTAC) 150 MG tablet TAKE 1 TABLET BY MOUTH 2 TIMES DAILY 90 tablet 3  . rosuvastatin (CRESTOR) 10 MG tablet Take 1 tablet (10 mg total) by mouth daily. 90 tablet 3  . tiZANidine (ZANAFLEX) 4 MG tablet Take 1 tablet (4 mg total) by mouth every 6 (six) hours as needed for muscle spasms. 30 tablet 1  . valACYclovir (VALTREX) 500 MG tablet Take twice daily for 3-5 days 30 tablet 1   No facility-administered medications prior to visit.     ROS Review of  Systems  Constitutional: Negative for activity change, appetite change, chills, fatigue and unexpected weight change.  HENT: Negative for congestion, mouth sores and sinus pressure.   Eyes: Negative for visual disturbance.  Respiratory: Negative for cough and chest tightness.   Gastrointestinal: Negative for abdominal pain and nausea.  Genitourinary: Negative for difficulty urinating, frequency and vaginal pain.  Musculoskeletal: Positive for arthralgias. Negative for back pain and gait problem.  Skin: Negative for pallor and rash.  Neurological: Negative for dizziness, tremors, weakness, numbness and headaches.  Psychiatric/Behavioral: Negative for confusion and sleep disturbance.    Objective:  BP 122/82 (BP Location: Left Arm, Patient Position: Sitting, Cuff Size: Normal)   Pulse 78   Temp 97.8 F (36.6 C) (Oral)   Ht 4\' 10"  (1.473 m)   Wt 131 lb (59.4 kg)   SpO2 98%   BMI 27.38 kg/m   BP Readings from Last 3 Encounters:  03/21/17 122/82  03/06/17 120/78  12/09/16 (!) 144/80    Wt Readings from Last 3 Encounters:  03/21/17 131 lb (59.4 kg)  03/06/17 130 lb (59 kg)  12/09/16 130 lb (59 kg)    Physical Exam  Constitutional: She appears well-developed. No distress.  HENT:  Head: Normocephalic.  Right Ear: External ear normal.  Left Ear: External  ear normal.  Nose: Nose normal.  Mouth/Throat: Oropharynx is clear and moist.  Eyes: Pupils are equal, round, and reactive to light. Conjunctivae are normal. Right eye exhibits no discharge. Left eye exhibits no discharge.  Neck: Normal range of motion. Neck supple. No JVD present. No tracheal deviation present. No thyromegaly present.  Cardiovascular: Normal rate, regular rhythm and normal heart sounds.   Pulmonary/Chest: No stridor. No respiratory distress. She has no wheezes.  Abdominal: Soft. Bowel sounds are normal. She exhibits no distension and no mass. There is no tenderness. There is no rebound and no guarding.    Musculoskeletal: She exhibits no edema or tenderness.  Lymphadenopathy:    She has no cervical adenopathy.  Neurological: She displays normal reflexes. No cranial nerve deficit. She exhibits normal muscle tone. Coordination normal.  Skin: No rash noted. No erythema.  Psychiatric: She has a normal mood and affect. Her behavior is normal. Judgment and thought content normal.   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  Assessment & Plan:   There are no diagnoses linked to this encounter. I am having Sharon Paul maintain her fluticasone, aspirin, ibuprofen, Cholecalciferol, omeprazole, valACYclovir, LORazepam, ipratropium, labetalol, ranitidine, tiZANidine, irbesartan, FLUoxetine, rosuvastatin, and estradiol.  No orders of the defined types were placed in this encounter.    Follow-up: No Follow-up on file.  Walker Kehr, MD

## 2017-03-21 NOTE — Patient Instructions (Addendum)
Try Turmeric  Hold Crestor x2-3 weeks

## 2017-03-21 NOTE — Assessment & Plan Note (Signed)
Prozac,  Lorazepam prn  Potential benefits of a long term benzodiazepines  use as well as potential risks  and complications were explained to the patient and were aknowledged. 

## 2017-03-22 NOTE — Addendum Note (Signed)
Addended by: Aviva Signs M on: 03/22/2017 01:33 PM   Modules accepted: Orders

## 2017-03-27 DIAGNOSIS — G8929 Other chronic pain: Secondary | ICD-10-CM | POA: Diagnosis not present

## 2017-03-27 DIAGNOSIS — M7542 Impingement syndrome of left shoulder: Secondary | ICD-10-CM | POA: Diagnosis not present

## 2017-03-27 DIAGNOSIS — M25512 Pain in left shoulder: Secondary | ICD-10-CM | POA: Diagnosis not present

## 2017-03-29 ENCOUNTER — Other Ambulatory Visit: Payer: Self-pay | Admitting: Internal Medicine

## 2017-03-29 ENCOUNTER — Ambulatory Visit (HOSPITAL_COMMUNITY)
Admission: RE | Admit: 2017-03-29 | Discharge: 2017-03-29 | Disposition: A | Discharge status: Home / Self Care | Payer: Medicare Other | Source: Ambulatory Visit | Attending: Cardiovascular Disease | Admitting: Cardiovascular Disease

## 2017-03-29 DIAGNOSIS — I739 Peripheral vascular disease, unspecified: Secondary | ICD-10-CM

## 2017-03-29 DIAGNOSIS — I6523 Occlusion and stenosis of bilateral carotid arteries: Secondary | ICD-10-CM | POA: Diagnosis not present

## 2017-03-29 DIAGNOSIS — R42 Dizziness and giddiness: Secondary | ICD-10-CM | POA: Insufficient documentation

## 2017-03-29 DIAGNOSIS — E785 Hyperlipidemia, unspecified: Secondary | ICD-10-CM | POA: Insufficient documentation

## 2017-03-29 DIAGNOSIS — H538 Other visual disturbances: Secondary | ICD-10-CM | POA: Diagnosis not present

## 2017-03-29 DIAGNOSIS — I779 Disorder of arteries and arterioles, unspecified: Secondary | ICD-10-CM

## 2017-03-29 DIAGNOSIS — I10 Essential (primary) hypertension: Secondary | ICD-10-CM | POA: Insufficient documentation

## 2017-04-02 ENCOUNTER — Telehealth: Payer: Self-pay | Admitting: Internal Medicine

## 2017-04-02 NOTE — Telephone Encounter (Signed)
Inez Catalina, Please inform the patient that her carotid stenosis is mild and stable. Thanks, AP

## 2017-04-04 ENCOUNTER — Other Ambulatory Visit: Payer: Self-pay | Admitting: Gynecology

## 2017-04-04 ENCOUNTER — Encounter: Payer: Self-pay | Admitting: Gynecology

## 2017-04-04 ENCOUNTER — Ambulatory Visit (INDEPENDENT_AMBULATORY_CARE_PROVIDER_SITE_OTHER): Payer: Medicare Other

## 2017-04-04 DIAGNOSIS — M8580 Other specified disorders of bone density and structure, unspecified site: Secondary | ICD-10-CM

## 2017-04-04 DIAGNOSIS — M8589 Other specified disorders of bone density and structure, multiple sites: Secondary | ICD-10-CM

## 2017-04-04 DIAGNOSIS — Z780 Asymptomatic menopausal state: Secondary | ICD-10-CM | POA: Diagnosis not present

## 2017-04-04 NOTE — Telephone Encounter (Signed)
Pt.notified

## 2017-04-25 ENCOUNTER — Encounter: Payer: Self-pay | Admitting: Family Medicine

## 2017-04-25 ENCOUNTER — Ambulatory Visit (INDEPENDENT_AMBULATORY_CARE_PROVIDER_SITE_OTHER): Payer: Medicare Other | Admitting: Family Medicine

## 2017-04-25 VITALS — BP 140/76 | HR 60 | Temp 97.9°F | Ht <= 58 in | Wt 131.0 lb

## 2017-04-25 DIAGNOSIS — B9789 Other viral agents as the cause of diseases classified elsewhere: Secondary | ICD-10-CM

## 2017-04-25 DIAGNOSIS — J069 Acute upper respiratory infection, unspecified: Secondary | ICD-10-CM

## 2017-04-25 NOTE — Assessment & Plan Note (Signed)
Symptoms seem to be viral in nature. - Advised supportive care - Given indications to call back or return

## 2017-04-25 NOTE — Progress Notes (Signed)
Angelin Cutrone - 71 y.o. female MRN [id number]  Date of birth: 1946/02/17  SUBJECTIVE:  Including CC & ROS.  Chief Complaint  Patient presents with  . Sinus Problem    Patient states she has been having frontal headaches and her glasses hurt to sit on her nose from all the pressure. also states that she feels drainage in the back of her throat   Ms. Buckel is a 71 yo F that is presenting with URI symptoms. She reports symptoms have been present for 3-4 days. She is having some postnasal drip and headache. The headache is frontal in nature. She is also having some sinus pressure. She denies any fevers or chills. She is having some teeth pain. She has not had any coughing. She is having some sore throat. She has used a Nettie pot but no other medications. She does have a history of hypertension.     Review of Systems  Constitutional: Negative for fever.  HENT: Positive for sore throat. Negative for ear pain.   Eyes: Positive for itching.  Respiratory: Negative for shortness of breath.     HISTORY: Past Medical, Surgical, Social, and Family History Reviewed & Updated per EMR.   Pertinent Historical Findings include:  Past Medical History:  Diagnosis Date  . Celiac sprue 2007  . CVD (cardiovascular disease)   . Foot fracture, left 2011  . GERD (gastroesophageal reflux disease)   . Hyperlipidemia   . Osteopenia 03/2017   T score -1.8 FRAX 6%/1.2%. Stable from prior DEXA  . PVD (peripheral vascular disease) (HCC)    mild carotid ? fibromuscular dysplasia  . Urinary incontinence     Past Surgical History:  Procedure Laterality Date  . APPENDECTOMY    . BREAST SURGERY     right breast biopsy  . ESOPHAGOGASTRODUODENOSCOPY    . ROTATOR CUFF REPAIR     RIGHT  . stress cardiolite  04-18-00  . VAGINAL HYSTERECTOMY  1993   leiomyomata    Allergies  Allergen Reactions  . Amlodipine     HA  . Augmentin [Amoxicillin-Pot Clavulanate]     diarrhea  .  Clarithromycin     REACTION: gi upset  . Codeine Nausea And Vomiting  . Esomeprazole Magnesium     REACTION: side pain  . Hydrochlorothiazide     REACTION: leg cramps, dehydration  . Iohexol      Desc: PT HAS SWELLING TO FACE,LIPS AND THROAT WITH CONTRAST DYE   . Kapidex [Dexlansoprazole]     rash  . Losartan Potassium     REACTION: HA  . Olmesartan Medoxomil     REACTION: weak legs  . Ramipril     Per pt: unknown reaction  . Risedronate Sodium     Per pt: unknown reaction    Family History  Problem Relation Age of Onset  . Glaucoma Mother   . Heart disease Mother   . Hypertension Mother   . Heart disease Father   . Hypertension Father   . Stroke Father   . Hypertension Paternal Grandmother   . Lung cancer Paternal Grandmother   . Hypertension Sister   . Hypertension Brother   . Glaucoma Brother   . Colon cancer Neg Hx      Social History   Social History  . Marital status: Married    Spouse name: Theotis Burrow  . Number of children: N/A  . Years of education: N/A   Occupational History  .  [employer]   Social History Main  Topics  . Smoking status: Former Smoker    Quit date: 02/11/1970  . Smokeless tobacco: Never Used  . Alcohol use 0.0 oz/week     Comment: very rare  . Drug use: No  . Sexual activity: Yes    Birth control/ protection: Surgical, Post-menopausal     Comment: 1st intercourse 71 yo-Fewer than 5 partners   Other Topics Concern  . Not on file   Social History Narrative  . No narrative on file     PHYSICAL EXAM:  VS: BP 140/76 (BP Location: Left Arm, Patient Position: Sitting, Cuff Size: Normal)   Pulse 60   Temp 97.9 F (36.6 C) (Oral)   Ht 4\' 10"  (1.473 m)   Wt 131 lb (59.4 kg)   SpO2 99%   BMI 27.38 kg/m  Physical Exam Gen: NAD, alert, cooperative with exam, well-appearing ENT: normal lips, normal nasal mucosa, some clear fluid within the right ear but normal-appearing tympanic membrane. Normal pharynx left tympanic  membrane. Some inflamed turbinates. Normal air pharynx. No cervical lymphadenopathy. Eye: normal EOM, normal conjunctiva and lids CV:  no edema, +2 pedal pulses , regular rate and rhythm, S1-S2 Resp: no accessory muscle use, non-labored,  no wheezes or crackles Skin: no rashes, no areas of induration  Neuro: normal tone, normal sensation to touch Psych:  normal insight, alert and oriented MSK: Normal strength, normal gait      ASSESSMENT & PLAN:   Upper respiratory tract infection Symptoms seem to be viral in nature. - Advised supportive care - Given indications to call back or return

## 2017-04-25 NOTE — Patient Instructions (Addendum)
Thank you for coming in,   Please try things such as zyrtec-D or allegra-D which is an antihistamine and decongestant.   Please try afrin which will help with nasal congestion but use for only three days.   Please also try using a netti pot on a regular occasion.  Please try ibuprofen or tylenol for headache.   Please feel free to call with any questions or concerns at any time, at [phone]. --Dr. Raeford Razor

## 2017-05-06 IMAGING — DX DG ABDOMEN 2V
2 series · 2 of 2 positions shown · non-contrast
Comparison: CT abdomen and pelvis April 21, 2006

CLINICAL DATA: Abdominal pain

EXAM:
ABDOMEN - 2 VIEW

[abdomen erect]
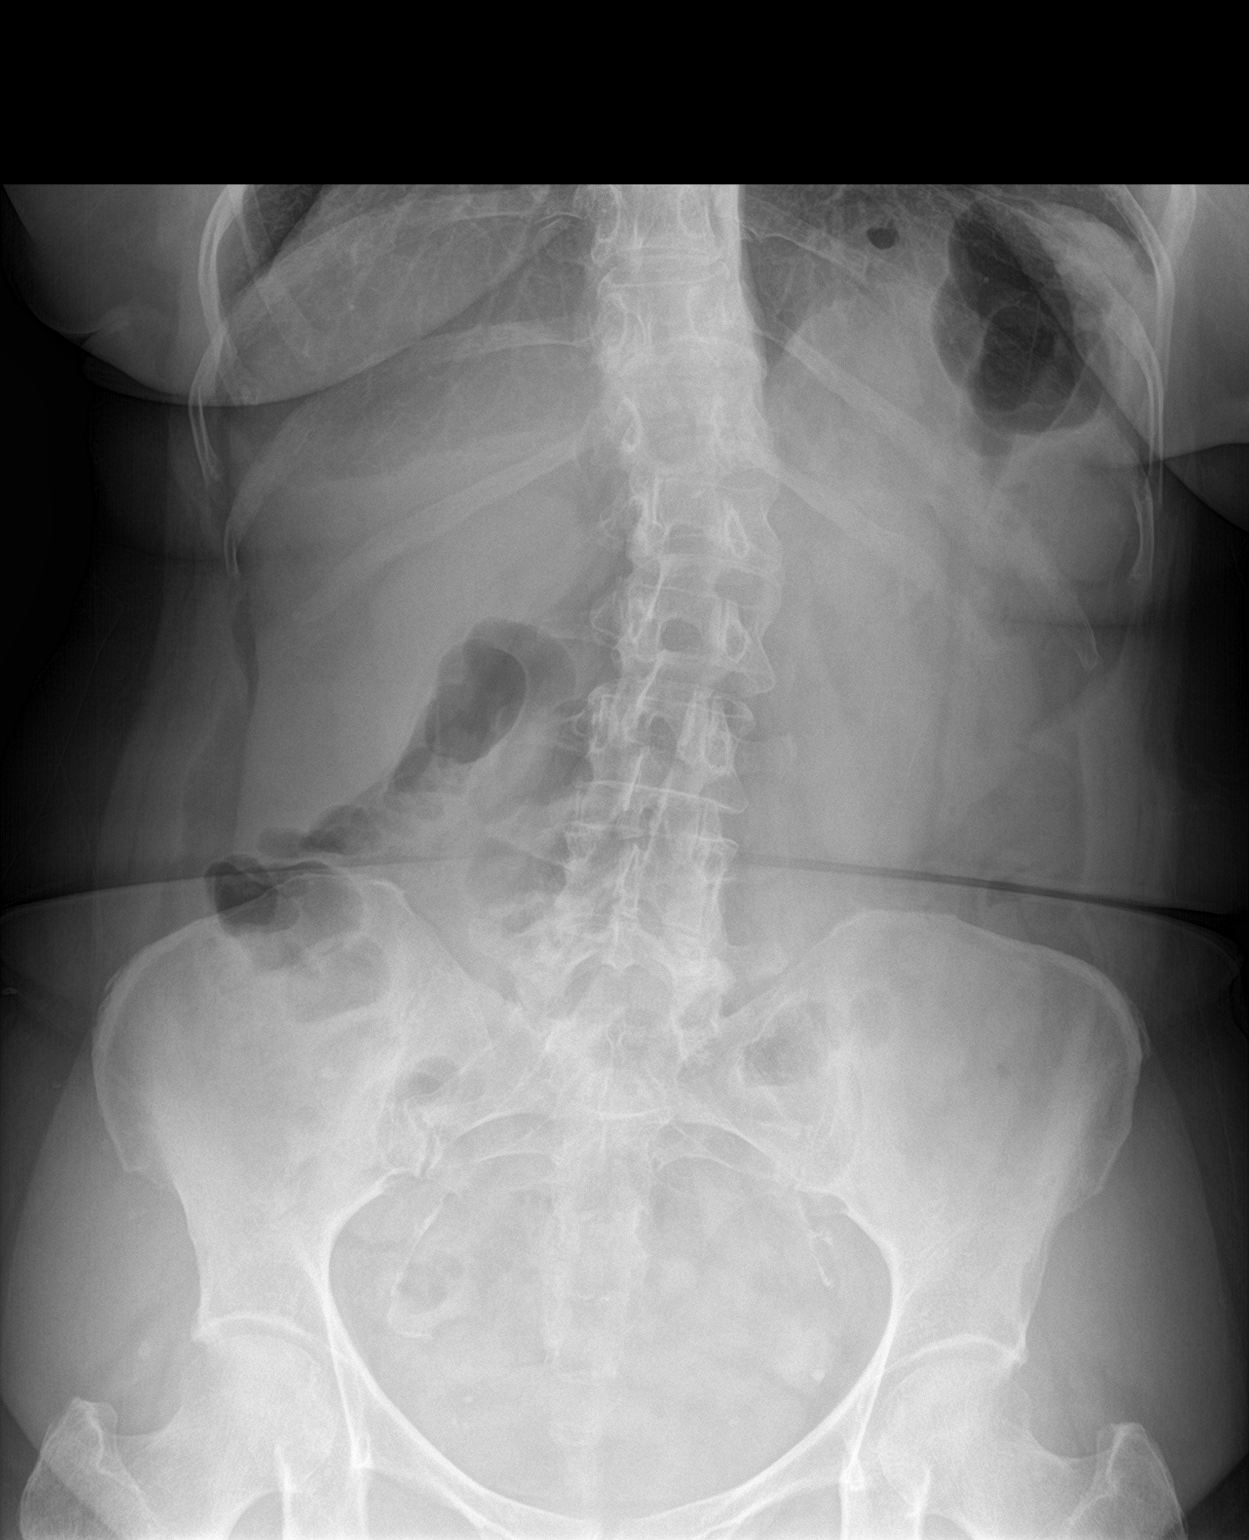

[abdomen supine]
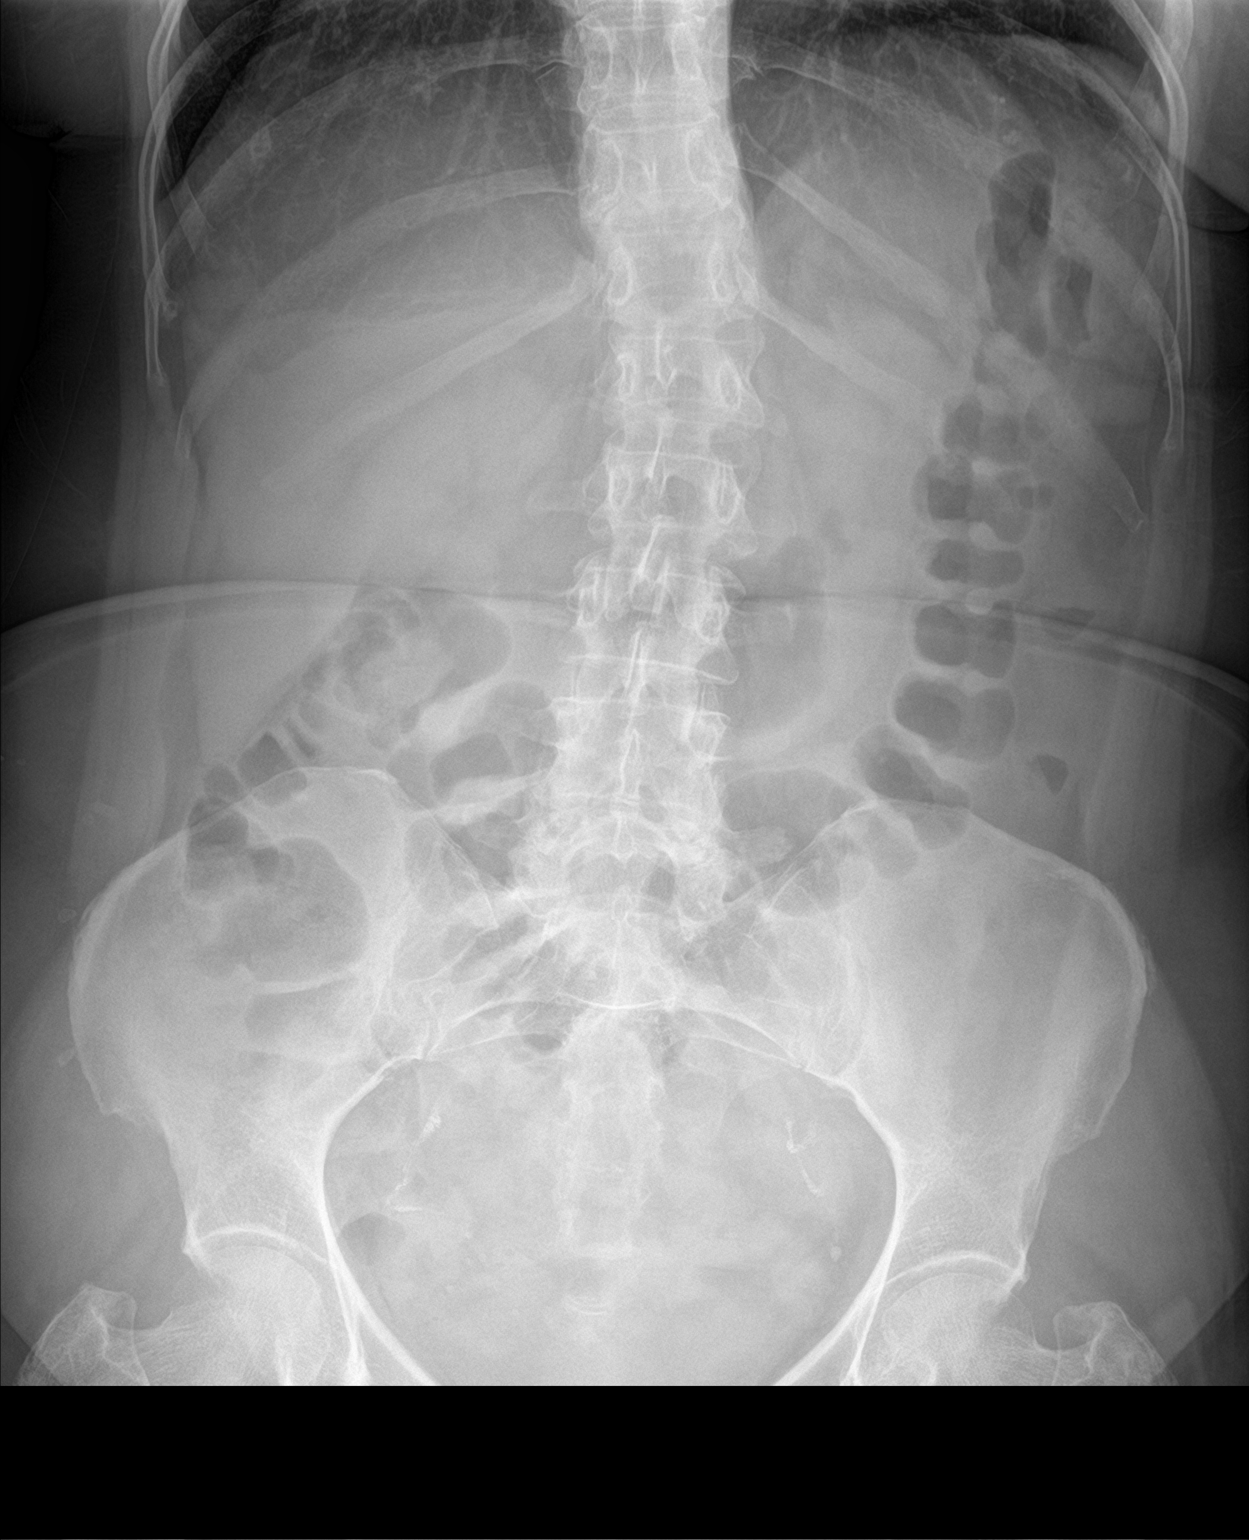

[2 of 2 positions shown; findings below may reference images not displayed]

FINDINGS: Supine and upright abdomen images obtained. There is moderate stool
throughout the colon. There is no bowel dilatation or air-fluid
level suggesting bowel obstruction. No free air. Visualized lung
bases are clear. There is calcification in both iliac arteries.
There are phleboliths in the pelvis. There is lumbar levoscoliosis.
IMPRESSION: Moderate stool in colon. No bowel obstruction or free air evident.
There is iliac atherosclerosis.

## 2017-05-08 DIAGNOSIS — M7542 Impingement syndrome of left shoulder: Secondary | ICD-10-CM | POA: Diagnosis not present

## 2017-05-12 ENCOUNTER — Ambulatory Visit: Payer: Medicare Other

## 2017-05-15 DIAGNOSIS — R350 Frequency of micturition: Secondary | ICD-10-CM | POA: Diagnosis not present

## 2017-05-19 ENCOUNTER — Ambulatory Visit: Payer: Medicare Other

## 2017-05-25 ENCOUNTER — Telehealth: Payer: Self-pay | Admitting: *Deleted

## 2017-05-25 NOTE — Telephone Encounter (Signed)
Pt called stating she stopped her climara patch 0.025 mg c/o headaches. Pt said after stopped no more headaches, symptoms are very minimum. Pt wanted this to documented in her chart she is no longer taking patch.

## 2017-05-26 ENCOUNTER — Ambulatory Visit (INDEPENDENT_AMBULATORY_CARE_PROVIDER_SITE_OTHER): Payer: Medicare Other | Admitting: *Deleted

## 2017-05-26 ENCOUNTER — Ambulatory Visit: Payer: Medicare Other

## 2017-05-26 DIAGNOSIS — Z23 Encounter for immunization: Secondary | ICD-10-CM | POA: Diagnosis not present

## 2017-06-06 DIAGNOSIS — Z1231 Encounter for screening mammogram for malignant neoplasm of breast: Secondary | ICD-10-CM | POA: Diagnosis not present

## 2017-06-06 LAB — HM MAMMOGRAPHY

## 2017-06-08 ENCOUNTER — Encounter: Payer: Self-pay | Admitting: Internal Medicine

## 2017-07-25 ENCOUNTER — Encounter: Payer: Self-pay | Admitting: Internal Medicine

## 2017-07-25 ENCOUNTER — Ambulatory Visit (INDEPENDENT_AMBULATORY_CARE_PROVIDER_SITE_OTHER): Payer: Medicare Other | Admitting: Internal Medicine

## 2017-07-25 DIAGNOSIS — I10 Essential (primary) hypertension: Secondary | ICD-10-CM | POA: Diagnosis not present

## 2017-07-25 DIAGNOSIS — I739 Peripheral vascular disease, unspecified: Secondary | ICD-10-CM

## 2017-07-25 MED ORDER — IRBESARTAN 300 MG PO TABS: 150.0000 mg | ORAL_TABLET | Freq: Two times a day (BID) | ORAL | 3 refills | Status: DC

## 2017-07-25 MED ORDER — PANTOPRAZOLE SODIUM 40 MG PO TBEC: 40.0000 mg | DELAYED_RELEASE_TABLET | Freq: Every day | ORAL | 3 refills | Status: DC

## 2017-07-25 MED ORDER — PANTOPRAZOLE SODIUM 40 MG PO TBEC: 40.0000 mg | DELAYED_RELEASE_TABLET | Freq: Every day | ORAL | 0 refills | Status: DC

## 2017-07-25 MED ORDER — ESTRADIOL 0.1 MG/GM VA CREA: 1.0000 | TOPICAL_CREAM | Freq: Every day | VAGINAL | 3 refills | Status: DC

## 2017-07-25 MED ORDER — LABETALOL HCL 300 MG PO TABS: 300.0000 mg | ORAL_TABLET | Freq: Two times a day (BID) | ORAL | 3 refills | Status: DC

## 2017-07-25 NOTE — Assessment & Plan Note (Signed)
Crestor,  ASA 

## 2017-07-25 NOTE — Progress Notes (Signed)
Subjective:  Patient ID: [name], female    DOB: 10/16/1945  Age: 71 y.o. MRN: [id number]  CC: No chief complaint on file.   HPI Jaskirat Zertuche presents for HTN, depression, dyslipidemia f/u. C/o GERD - Pantoprazole helped at 40 mg. Pt stopped Climara patch due to HAs. C/o dryness  Outpatient Medications Prior to Visit  Medication Sig Dispense Refill  . aspirin 81 MG tablet Take 81 mg by mouth daily.    . Cholecalciferol 2000 UNITS TABS Take 1 tablet by mouth daily.    [name] FLUoxetine (PROZAC) 10 MG capsule TAKE 1 CAPSULE BY MOUTH  DAILY 90 capsule 2  . fluticasone (FLONASE) 50 MCG/ACT nasal spray PLACE 2 SPRAYS INTO THE NOSE DAILY. 16 g 2  . ibuprofen (ADVIL,MOTRIN) 600 MG tablet TAKE 1 TABLET (600 MG TOTAL) BY MOUTH EVERY 8 (EIGHT) HOURS AS NEEDED FOR PAIN. 90 tablet 3  . ipratropium (ATROVENT) 0.03 % nasal spray Place 2 sprays into both nostrils 2 (two) times daily. Do not use for more than 5days. 30 mL 0  . irbesartan (AVAPRO) 300 MG tablet Take 0.5 tablets (150 mg total) by mouth 2 (two) times daily. 90 tablet 3  . labetalol (NORMODYNE) 300 MG tablet TAKE 1 TABLET BY MOUTH TWO  TIMES DAILY 180 tablet 3  . LORazepam (ATIVAN) 0.5 MG tablet TAKE 1 TABLET BY MOUTH TWICE A DAY 60 tablet 3  . omeprazole (PRILOSEC) 40 MG capsule Take 1 capsule by mouth  daily 90 capsule 3  . ranitidine (ZANTAC) 150 MG tablet TAKE 1 TABLET BY MOUTH 2 TIMES DAILY 90 tablet 3  . rosuvastatin (CRESTOR) 10 MG tablet Take 1 tablet (10 mg total) by mouth daily. 90 tablet 3  . tiZANidine (ZANAFLEX) 4 MG tablet Take 1 tablet (4 mg total) by mouth every 6 (six) hours as needed for muscle spasms. 30 tablet 1  . valACYclovir (VALTREX) 500 MG tablet Take twice daily for 3-5 days 30 tablet 1  . estradiol (CLIMARA - DOSED IN MG/24 HR) 0.025 mg/24hr patch Place 1 patch (0.025 mg total) onto the skin once a week. 12 patch 4   No facility-administered medications prior to visit.     ROS Review of  Systems  Constitutional: Negative for activity change, appetite change, chills, fatigue and unexpected weight change.  HENT: Negative for congestion, mouth sores and sinus pressure.   Eyes: Negative for visual disturbance.  Respiratory: Negative for cough and chest tightness.   Gastrointestinal: Negative for abdominal pain and nausea.  Genitourinary: Negative for difficulty urinating, frequency and vaginal pain.  Musculoskeletal: Negative for back pain and gait problem.  Skin: Negative for pallor and rash.  Neurological: Negative for dizziness, tremors, weakness, numbness and headaches.  Psychiatric/Behavioral: Negative for confusion and sleep disturbance.    Objective:  BP 138/76 (BP Location: Right Arm, Patient Position: Sitting, Cuff Size: Normal)   Pulse 61   Temp 97.9 F (36.6 C) (Oral)   Ht 4\' 10"  (1.473 m)   Wt 131 lb (59.4 kg)   SpO2 99%   BMI 27.38 kg/m   BP Readings from Last 3 Encounters:  07/25/17 138/76  04/25/17 140/76  03/21/17 122/82    Wt Readings from Last 3 Encounters:  07/25/17 131 lb (59.4 kg)  04/25/17 131 lb (59.4 kg)  03/21/17 131 lb (59.4 kg)    Physical Exam  Constitutional: She appears well-developed. No distress.  HENT:  Head: Normocephalic.  Right Ear: External ear normal.  Left Ear: External ear normal.  Nose: Nose normal.  Mouth/Throat: Oropharynx is clear and moist.  Eyes: Conjunctivae are normal. Pupils are equal, round, and reactive to light. Right eye exhibits no discharge. Left eye exhibits no discharge.  Neck: Normal range of motion. Neck supple. No JVD present. No tracheal deviation present. No thyromegaly present.  Cardiovascular: Normal rate, regular rhythm and normal heart sounds.  Pulmonary/Chest: No stridor. No respiratory distress. She has no wheezes.  Abdominal: Soft. Bowel sounds are normal. She exhibits no distension and no mass. There is no tenderness. There is no rebound and no guarding.  Musculoskeletal: She exhibits  no edema or tenderness.  Lymphadenopathy:    She has no cervical adenopathy.  Neurological: She displays normal reflexes. No cranial nerve deficit. She exhibits normal muscle tone. Coordination normal.  Skin: No rash noted. No erythema.  Psychiatric: She has a normal mood and affect. Her behavior is normal. Judgment and thought content normal.    Lab Results  Component Value Date   WBC 3.6 (L) 09/08/2016   HGB 12.8 09/08/2016   HCT 36.9 09/08/2016   PLT 263.0 09/08/2016   GLUCOSE 84 11/16/2016   CHOL 173 03/15/2017   TRIG 157.0 (H) 03/15/2017   HDL 57.60 03/15/2017   LDLDIRECT 138.2 10/22/2010   LDLCALC 84 03/15/2017   ALT 16 11/16/2016   AST 16 11/16/2016   NA 138 11/16/2016   K 4.2 11/16/2016   CL 106 11/16/2016   CREATININE 0.80 11/16/2016   BUN 15 11/16/2016   CO2 26 11/16/2016   TSH 1.68 09/08/2016   HGBA1C 6.1 03/15/2017    No results found.  Assessment & Plan:   There are no diagnoses linked to this encounter. I have discontinued Fujiko Sanchez-Grateron's estradiol. I am also having her maintain her fluticasone, aspirin, ibuprofen, Cholecalciferol, omeprazole, valACYclovir, LORazepam, ipratropium, labetalol, ranitidine, tiZANidine, irbesartan, FLUoxetine, and rosuvastatin.  No orders of the defined types were placed in this encounter.    Follow-up: No Follow-up on file.  Walker Kehr, MD

## 2017-07-25 NOTE — Assessment & Plan Note (Signed)
Labetalol, Irbesartan HCTZ - stopped

## 2017-07-27 ENCOUNTER — Other Ambulatory Visit (INDEPENDENT_AMBULATORY_CARE_PROVIDER_SITE_OTHER): Payer: Medicare Other

## 2017-07-27 DIAGNOSIS — I10 Essential (primary) hypertension: Secondary | ICD-10-CM | POA: Diagnosis not present

## 2017-07-27 LAB — BASIC METABOLIC PANEL
BUN: 16 mg/dL (ref 6–23)
CO2: 29 mEq/L (ref 19–32)
CREATININE: 0.8 mg/dL (ref 0.40–1.20)
Calcium: 9.8 mg/dL (ref 8.4–10.5)
Chloride: 103 mEq/L (ref 96–112)
GFR: 75.2 mL/min (ref >60.00)
GLUCOSE: 106 mg/dL — AB (ref 70–99)
POTASSIUM: 4.4 meq/L (ref 3.5–5.1)
Sodium: 139 mEq/L (ref 135–145)

## 2017-07-31 ENCOUNTER — Telehealth: Payer: Self-pay | Admitting: Internal Medicine

## 2017-07-31 NOTE — Telephone Encounter (Signed)
Please advise and clarify.

## 2017-07-31 NOTE — Telephone Encounter (Signed)
Malachy Mood, [facility] representative, called needing clarification of instructions for Estradiol vaginal cream. Instructions state for pt to take 1 applicator full vaginally at bedtime but the pharmacist states this medication is usually written to be taken 2-3 times/week.[facility] can be contacted at [phone]. Reference # [id number].

## 2017-08-01 NOTE — Telephone Encounter (Signed)
She will start qhs and later reduce to 2-3/wk. The pt is aware Thx

## 2017-08-01 NOTE — Telephone Encounter (Signed)
See below

## 2017-08-02 ENCOUNTER — Telehealth: Payer: Self-pay | Admitting: Internal Medicine

## 2017-08-02 NOTE — Telephone Encounter (Signed)
Pharmacy notified.

## 2017-08-02 NOTE — Telephone Encounter (Signed)
Copied from [place] [phone]. Topic: Quick Communication - See Telephone Encounter >> Aug 02, 2017  4:25 PM Boyd Kerbs wrote: CRM for notification. See Telephone encounter for:  Patient called regarding lab results, says there is something in my chart but her computer is not working.  Asked if you could call her and mail results also  08/02/17.

## 2017-08-03 NOTE — Telephone Encounter (Signed)
Pt notified, lab mailed

## 2017-08-08 NOTE — Telephone Encounter (Signed)
Sharon Paul--see note regarding RX ( I attached to previous phone note in error)

## 2017-08-08 NOTE — Telephone Encounter (Signed)
Copied from [place]. Topic: Inquiry >> Aug 08, 2017  3:30 PM Patrice Paradise wrote: Reason for CRM: Pt is requesting an alternative to the following RX estradiol (ESTRACE VAGINAL) 0.1 MG/GM vaginal cream. Her copay for the Rx is $275. Pt is requesting a call back from Dr. Alain Marion assistant. Pt also stated that her pharmacy [facility] will be faxing over an alternative med for the doctor to review.

## 2017-08-09 NOTE — Telephone Encounter (Signed)
[facility] faxed a RX for Premarin cream, okay to switch to this from estradiol?

## 2017-08-09 NOTE — Telephone Encounter (Signed)
Premarin is ok, same direction Thx

## 2017-08-10 MED ORDER — ESTROGENS, CONJUGATED 0.625 MG/GM VA CREA: 1.0000 | TOPICAL_CREAM | Freq: Every day | VAGINAL | 3 refills | Status: DC

## 2017-08-10 NOTE — Telephone Encounter (Signed)
RX sent

## 2017-09-05 ENCOUNTER — Telehealth: Payer: Self-pay | Admitting: *Deleted

## 2017-09-05 NOTE — Telephone Encounter (Signed)
Pt informed

## 2017-09-05 NOTE — Telephone Encounter (Signed)
Patient called wanted to relay to you that she has stopped the climara patch. She saw her PCP recently and mentioned to him that she has severe vaginal dryness he prescribed premarin vaginal cream. Patient wanted to make sure this is a medication you recommend. Please advise

## 2017-09-05 NOTE — Telephone Encounter (Signed)
I am okay with that.

## 2017-09-11 ENCOUNTER — Ambulatory Visit (INDEPENDENT_AMBULATORY_CARE_PROVIDER_SITE_OTHER): Payer: Medicare Other | Admitting: Internal Medicine

## 2017-09-11 ENCOUNTER — Encounter: Payer: Self-pay | Admitting: Internal Medicine

## 2017-09-11 VITALS — BP 128/78 | HR 67 | Temp 98.9°F | Ht <= 58 in | Wt 131.0 lb

## 2017-09-11 DIAGNOSIS — J069 Acute upper respiratory infection, unspecified: Secondary | ICD-10-CM

## 2017-09-11 DIAGNOSIS — J029 Acute pharyngitis, unspecified: Secondary | ICD-10-CM | POA: Diagnosis not present

## 2017-09-11 LAB — POCT RAPID STREP A (OFFICE): Rapid Strep A Screen: NEGATIVE

## 2017-09-11 MED ORDER — AZITHROMYCIN 250 MG PO TABS: ORAL_TABLET | ORAL | 0 refills | Status: DC

## 2017-09-11 MED ORDER — PROMETHAZINE-CODEINE 6.25-10 MG/5ML PO SYRP: 5.0000 mL | ORAL_SOLUTION | ORAL | 0 refills | Status: DC | PRN

## 2017-09-11 NOTE — Addendum Note (Signed)
Addended by: Karren Cobble on: 09/11/2017 02:02 PM   Modules accepted: Orders

## 2017-09-11 NOTE — Patient Instructions (Signed)
You can use over-the-counter  "cold" medicines  such as  "Afrin" nasal spray for nasal congestion as directed. Use " Delsym" or" Robitussin" cough syrup varietis for cough.  You can use plain "Tylenol" or "Advil" for fever, chills and achyness. Use Halls or Ricola cough drops.     Please, make an appointment if you are not better or if you're worse.  

## 2017-09-11 NOTE — Assessment & Plan Note (Signed)
Strep test Zpac

## 2017-09-11 NOTE — Progress Notes (Signed)
Subjective:  Patient ID: Sharon Paul, female    DOB: 1946-05-23  Age: 72 y.o. MRN: [id number]  CC: No chief complaint on file.   HPI Sharon Paul presents for a URI x 1 week C/o yellow nasal d/c, HA  Outpatient Medications Prior to Visit  Medication Sig Dispense Refill  . aspirin 81 MG tablet Take 81 mg by mouth daily.    . Cholecalciferol 2000 UNITS TABS Take 1 tablet by mouth daily.    [name] conjugated estrogens (PREMARIN) vaginal cream Place 1 Applicatorful vaginally daily. 42.5 g 3  . estradiol (ESTRACE VAGINAL) 0.1 MG/GM vaginal cream Place 1 Applicatorful vaginally at bedtime. 42.5 g 3  . FLUoxetine (PROZAC) 10 MG capsule TAKE 1 CAPSULE BY MOUTH  DAILY 90 capsule 2  . fluticasone (FLONASE) 50 MCG/ACT nasal spray PLACE 2 SPRAYS INTO THE NOSE DAILY. 16 g 2  . ibuprofen (ADVIL,MOTRIN) 600 MG tablet TAKE 1 TABLET (600 MG TOTAL) BY MOUTH EVERY 8 (EIGHT) HOURS AS NEEDED FOR PAIN. 90 tablet 3  . ipratropium (ATROVENT) 0.03 % nasal spray Place 2 sprays into both nostrils 2 (two) times daily. Do not use for more than 5days. 30 mL 0  . irbesartan (AVAPRO) 300 MG tablet Take 0.5 tablets (150 mg total) by mouth 2 (two) times daily. 90 tablet 3  . labetalol (NORMODYNE) 300 MG tablet Take 1 tablet (300 mg total) by mouth 2 (two) times daily. 180 tablet 3  . LORazepam (ATIVAN) 0.5 MG tablet TAKE 1 TABLET BY MOUTH TWICE A DAY 60 tablet 3  . omeprazole (PRILOSEC) 40 MG capsule Take 1 capsule by mouth  daily 90 capsule 3  . pantoprazole (PROTONIX) 40 MG tablet Take 1 tablet (40 mg total) by mouth daily. 30 tablet 0  . ranitidine (ZANTAC) 150 MG tablet TAKE 1 TABLET BY MOUTH 2 TIMES DAILY 90 tablet 3  . rosuvastatin (CRESTOR) 10 MG tablet Take 1 tablet (10 mg total) by mouth daily. 90 tablet 3  . tiZANidine (ZANAFLEX) 4 MG tablet Take 1 tablet (4 mg total) by mouth every 6 (six) hours as needed for muscle spasms. 30 tablet 1  . valACYclovir (VALTREX) 500 MG tablet Take twice daily  for 3-5 days 30 tablet 1   No facility-administered medications prior to visit.     ROS Review of Systems  Constitutional: Positive for fatigue. Negative for activity change, appetite change, chills and unexpected weight change.  HENT: Positive for postnasal drip, rhinorrhea, sinus pressure, sinus pain and sore throat. Negative for congestion and mouth sores.   Eyes: Negative for visual disturbance.  Respiratory: Negative for cough and chest tightness.   Gastrointestinal: Negative for abdominal pain and nausea.  Genitourinary: Negative for difficulty urinating, frequency and vaginal pain.  Musculoskeletal: Negative for back pain and gait problem.  Skin: Negative for pallor and rash.  Neurological: Negative for dizziness, tremors, weakness, numbness and headaches.  Psychiatric/Behavioral: Negative for confusion and sleep disturbance.    Objective:  BP 128/78 (BP Location: Left Arm, Patient Position: Sitting, Cuff Size: Normal)   Pulse 67   Temp 98.9 F (37.2 C) (Oral)   Ht 4\' 10"  (1.473 m)   Wt 131 lb (59.4 kg)   SpO2 98%   BMI 27.38 kg/m   BP Readings from Last 3 Encounters:  09/11/17 128/78  07/25/17 138/76  04/25/17 140/76    Wt Readings from Last 3 Encounters:  09/11/17 131 lb (59.4 kg)  07/25/17 131 lb (59.4 kg)  04/25/17 131 lb (59.4 kg)  Physical Exam  Constitutional: She appears well-developed. No distress.  HENT:  Head: Normocephalic.  Right Ear: External ear normal.  Left Ear: External ear normal.  Nose: Nose normal.  Mouth/Throat: Oropharynx is clear and moist.  Eyes: Conjunctivae are normal. Pupils are equal, round, and reactive to light. Right eye exhibits no discharge. Left eye exhibits no discharge.  Neck: Normal range of motion. Neck supple. No JVD present. No tracheal deviation present. No thyromegaly present.  Cardiovascular: Normal rate, regular rhythm and normal heart sounds.  Pulmonary/Chest: No stridor. No respiratory distress. She has no  wheezes.  Abdominal: Soft. Bowel sounds are normal. She exhibits no distension and no mass. There is no tenderness. There is no rebound and no guarding.  Musculoskeletal: She exhibits no edema or tenderness.  Lymphadenopathy:    She has no cervical adenopathy.  Neurological: She displays normal reflexes. No cranial nerve deficit. She exhibits normal muscle tone. Coordination normal.  Skin: No rash noted. No erythema.  Psychiatric: She has a normal mood and affect. Her behavior is normal. Judgment and thought content normal.  eryth throat  Lab Results  Component Value Date   WBC 3.6 (L) 09/08/2016   HGB 12.8 09/08/2016   HCT 36.9 09/08/2016   PLT 263.0 09/08/2016   GLUCOSE 106 (H) 07/27/2017   CHOL 173 03/15/2017   TRIG 157.0 (H) 03/15/2017   HDL 57.60 03/15/2017   LDLDIRECT 138.2 10/22/2010   LDLCALC 84 03/15/2017   ALT 16 11/16/2016   AST 16 11/16/2016   NA 139 07/27/2017   K 4.4 07/27/2017   CL 103 07/27/2017   CREATININE 0.80 07/27/2017   BUN 16 07/27/2017   CO2 29 07/27/2017   TSH 1.68 09/08/2016   HGBA1C 6.1 03/15/2017    No results found.  Assessment & Plan:   There are no diagnoses linked to this encounter. I am having Daron Sanchez-Grateron maintain her fluticasone, aspirin, ibuprofen, Cholecalciferol, omeprazole, valACYclovir, LORazepam, ipratropium, ranitidine, tiZANidine, FLUoxetine, rosuvastatin, irbesartan, labetalol, pantoprazole, estradiol, and conjugated estrogens.  No orders of the defined types were placed in this encounter.    Follow-up: No Follow-up on file.  Walker Kehr, MD

## 2017-09-12 ENCOUNTER — Telehealth: Payer: Self-pay | Admitting: Internal Medicine

## 2017-09-12 NOTE — Telephone Encounter (Signed)
Copied from [place] [phone]. Topic: General - Other >> Sep 12, 2017 11:40 AM Lolita Rieger, RMA wrote: Reason for CRM: Pt called and wanted to know if body aches can be symptoms of a sinus infection, pt would like a call back to discuss this [phone]

## 2017-09-12 NOTE — Telephone Encounter (Signed)
Copied from [place] [phone]. Topic: General - Other >> Sep 12, 2017 11:40 AM Lolita Rieger, RMA wrote: Reason for CRM: Pt called and wanted to know if body aches can be symptoms of a sinus infection, pt would like a call back to discuss this [phone]

## 2017-09-13 NOTE — Telephone Encounter (Signed)
LM notifying pt that with the symptoms she was having she can form body aches but if her symptoms get more severe or has any further questions to give a call back

## 2017-09-29 ENCOUNTER — Telehealth: Payer: Self-pay | Admitting: Internal Medicine

## 2017-09-29 NOTE — Telephone Encounter (Signed)
Copied from [place]. Topic: Quick Communication - See Telephone Encounter >> Sep 29, 2017  8:40 AM Percell Belt A wrote: CRM for notification. See Telephone encounter for: pt called in and said that she was seen a few weeks ago and had a sinus infection.  She said that it got better and now it is back again.  She would like to know if he could call something else in for her?    Pharmacy - [facility] [address]  09/29/17.

## 2017-10-01 MED ORDER — AZITHROMYCIN 250 MG PO TABS: ORAL_TABLET | ORAL | 0 refills | Status: DC

## 2017-10-01 NOTE — Telephone Encounter (Signed)
I reordered a Z-Pak.  Thank you

## 2017-10-02 NOTE — Telephone Encounter (Signed)
Called pt no answer LMOM w/MD response../lmb 

## 2017-10-11 ENCOUNTER — Encounter: Payer: Self-pay | Admitting: Gynecology

## 2017-10-11 ENCOUNTER — Ambulatory Visit: Payer: Medicare Other | Admitting: Gynecology

## 2017-10-11 VITALS — BP 124/74

## 2017-10-11 DIAGNOSIS — N3001 Acute cystitis with hematuria: Secondary | ICD-10-CM | POA: Diagnosis not present

## 2017-10-11 DIAGNOSIS — N898 Other specified noninflammatory disorders of vagina: Secondary | ICD-10-CM

## 2017-10-11 DIAGNOSIS — N952 Postmenopausal atrophic vaginitis: Secondary | ICD-10-CM

## 2017-10-11 LAB — WET PREP FOR TRICH, YEAST, CLUE

## 2017-10-11 MED ORDER — NITROFURANTOIN MONOHYD MACRO 100 MG PO CAPS: 100.0000 mg | ORAL_CAPSULE | Freq: Two times a day (BID) | ORAL | 0 refills | Status: DC

## 2017-10-11 NOTE — Patient Instructions (Signed)
Start using the formulated estrogen vaginal cream twice weekly through [facility].  My office staff will call a prescription in for you and then you can check with them to see when it is ready.  Take the Macrodantin antibiotic twice daily for 7 days.  Follow-up if your urinary symptoms continue or worsen.

## 2017-10-11 NOTE — Progress Notes (Signed)
    Sharon Paul Jan 13, 1946 [id number]        71 y.o.  [id number] presents with 2 issues:  1. Acute onset of dysuria and low back pain with hematuria.  Started last night and has progressed through today.  No urgency, frequency, fever or chills.  No nausea vomiting diarrhea constipation 2. Vaginal dryness and burning.  Had weaned herself off of her estradiol patch.  Had vaginal dryness and irritation.  Was started on Premarin vaginal cream recently by her primary physician and notes that its fairly expensive.  Past medical history,surgical history, problem list, medications, allergies, family history and social history were all reviewed and documented in the EPIC chart.  Directed ROS with pertinent positives and negatives documented in the history of present illness/assessment and plan.  Exam: Caryn Bee assistant Vitals:   10/11/17 0957  BP: 124/74   General appearance:  Normal in no acute distress Spine straight without CVA tenderness Abdomen soft nontender without masses guarding rebound Pelvic external BUS vagina with atrophic changes.  Scant white discharge noted.  Bimanual exam without masses or tenderness  Assessment/Plan:  72 y.o. [id number] with:  1. Acute cystitis with hematuria.  Urine analysis shows moderate bacteriuria with greater than 60 WBC and greater than 60 RBC.  Discussed with patient differential to include possible lithiasis.  Will treat with Macrobid 100 mg twice daily times 7 days.  Follow-up if symptoms persist, worsen or recur. 2. Vaginal dryness and burning.  Started on Premarin cream recently.  We discussed the issues of topical estrogen and risks of absorption with systemic effects to include thrombosis such as stroke heart attack DVT in the whole breast cancer issue.  Patient is not having issues with hot flushes and sweats since stopping her patch but it is primarily vaginal symptoms.  She wants to continue on vaginal estrogen.  I recommended she consider  estradiol formulated vaginal estrogen cream through [facility] and she wants to go ahead and do this.  Will call in a prescription for twice weekly disposable applicators through her next annual exam.  She will follow-up if there is any issues with this.  Greater than 50% of my time was spent in direct face to face counseling and coordination of care with the patient.     Anastasio Auerbach MD, 10:42 AM 10/11/2017

## 2017-10-11 NOTE — Addendum Note (Signed)
Addended by: Nelva Nay on: 10/11/2017 12:14 PM   Modules accepted: Orders

## 2017-10-12 ENCOUNTER — Encounter: Payer: Self-pay | Admitting: Gynecology

## 2017-10-12 ENCOUNTER — Other Ambulatory Visit: Payer: Self-pay | Admitting: Gynecology

## 2017-10-12 MED ORDER — NONFORMULARY OR COMPOUNDED ITEM: 5 refills | Status: DC

## 2017-10-14 LAB — URINALYSIS, COMPLETE W/RFL CULTURE
BILIRUBIN URINE: NEGATIVE
Glucose, UA: NEGATIVE
HYALINE CAST: NONE SEEN /LPF
Ketones, ur: NEGATIVE
Nitrites, Initial: NEGATIVE
RBC / HPF: >=60 /HPF — AB (ref 0–2)
Specific Gravity, Urine: 1.007 (ref 1.001–1.03)
WBC, UA: >=60 /HPF — AB (ref 0–5)
pH: 5 (ref 5.0–8.0)

## 2017-10-14 LAB — CULTURE INDICATED

## 2017-10-14 LAB — URINE CULTURE
MICRO NUMBER:: 90199056
SPECIMEN QUALITY:: ADEQUATE

## 2017-11-07 ENCOUNTER — Encounter: Payer: Self-pay | Admitting: Internal Medicine

## 2017-11-07 ENCOUNTER — Ambulatory Visit (INDEPENDENT_AMBULATORY_CARE_PROVIDER_SITE_OTHER): Payer: Medicare Other | Admitting: Internal Medicine

## 2017-11-07 DIAGNOSIS — B9789 Other viral agents as the cause of diseases classified elsewhere: Secondary | ICD-10-CM | POA: Diagnosis not present

## 2017-11-07 DIAGNOSIS — J069 Acute upper respiratory infection, unspecified: Secondary | ICD-10-CM | POA: Diagnosis not present

## 2017-11-07 MED ORDER — BENZONATATE 200 MG PO CAPS: 200.0000 mg | ORAL_CAPSULE | Freq: Three times a day (TID) | ORAL | 0 refills | Status: DC | PRN

## 2017-11-07 NOTE — Patient Instructions (Signed)
We have sent in tessalon perles for cough that you can use up to 3 times per day and they are non-drowsy.

## 2017-11-07 NOTE — Assessment & Plan Note (Signed)
Rx for tessalon perles for cough. Likely viral and reassurance given on likely course and otc remedies which are safe for her. Call back in 1 week if not improved.

## 2017-11-07 NOTE — Progress Notes (Signed)
   Subjective:    Patient ID: Sharon Paul, female    DOB: 22-Jul-1946, 72 y.o.   MRN: [id number]  HPI The patient is a 72 YO female coming in for about 3 days of cold symptoms. She is having some hoarseness. She denies fevers or chills. She denies SOB. Some fatigue and cough. Yellow sputum. She denies nausea or vomiting. She denies muscle aches or headaches. Overall worsening. She is taking mucinex but not flonase lately.   Review of Systems  Constitutional: Negative for activity change, appetite change, chills, fatigue, fever and unexpected weight change.  HENT: Positive for congestion, postnasal drip, rhinorrhea and sinus pressure. Negative for ear discharge, ear pain, sinus pain, sneezing, sore throat, tinnitus, trouble swallowing and voice change.   Eyes: Negative.   Respiratory: Positive for cough. Negative for chest tightness, shortness of breath and wheezing.   Cardiovascular: Negative.   Gastrointestinal: Negative.   Musculoskeletal: Negative for myalgias.  Neurological: Negative.       Objective:   Physical Exam  Constitutional: She is oriented to person, place, and time. She appears well-developed and well-nourished.  HENT:  Head: Normocephalic and atraumatic.  Oropharynx with redness and clear drainage, nose with swollen turbinates, TMs normal bilaterally  Eyes: EOM are normal.  Neck: Normal range of motion. No thyromegaly present.  Cardiovascular: Normal rate and regular rhythm.  Pulmonary/Chest: Effort normal and breath sounds normal. No respiratory distress. She has no wheezes. She has no rales.  Abdominal: Soft.  Lymphadenopathy:    She has no cervical adenopathy.  Neurological: She is alert and oriented to person, place, and time.  Skin: Skin is warm and dry.   Vitals:   11/07/17 1332  BP: 134/80  Pulse: 63  Temp: 98.1 F (36.7 C)  TempSrc: Oral  SpO2: 97%  Weight: 130 lb (59 kg)  Height: 4\' 10"  (1.473 m)      Assessment & Plan:

## 2017-11-10 ENCOUNTER — Other Ambulatory Visit: Payer: Self-pay | Admitting: Internal Medicine

## 2017-11-16 ENCOUNTER — Other Ambulatory Visit: Payer: Self-pay | Admitting: Internal Medicine

## 2017-11-21 DIAGNOSIS — H43812 Vitreous degeneration, left eye: Secondary | ICD-10-CM | POA: Diagnosis not present

## 2017-11-21 DIAGNOSIS — H25813 Combined forms of age-related cataract, bilateral: Secondary | ICD-10-CM | POA: Diagnosis not present

## 2017-11-22 ENCOUNTER — Encounter: Payer: Self-pay | Admitting: Internal Medicine

## 2017-11-22 ENCOUNTER — Ambulatory Visit (INDEPENDENT_AMBULATORY_CARE_PROVIDER_SITE_OTHER): Payer: Medicare Other | Admitting: Internal Medicine

## 2017-11-22 VITALS — BP 134/76 | HR 61 | Temp 98.7°F | Ht <= 58 in | Wt 133.0 lb

## 2017-11-22 DIAGNOSIS — E785 Hyperlipidemia, unspecified: Secondary | ICD-10-CM

## 2017-11-22 DIAGNOSIS — F411 Generalized anxiety disorder: Secondary | ICD-10-CM

## 2017-11-22 DIAGNOSIS — M19041 Primary osteoarthritis, right hand: Secondary | ICD-10-CM | POA: Diagnosis not present

## 2017-11-22 DIAGNOSIS — E559 Vitamin D deficiency, unspecified: Secondary | ICD-10-CM

## 2017-11-22 DIAGNOSIS — I10 Essential (primary) hypertension: Secondary | ICD-10-CM | POA: Diagnosis not present

## 2017-11-22 MED ORDER — FLUTICASONE PROPIONATE 50 MCG/ACT NA SUSP: NASAL | 3 refills | Status: DC

## 2017-11-22 MED ORDER — ICOSAPENT ETHYL 1 G PO CAPS: 2.0000 | ORAL_CAPSULE | Freq: Two times a day (BID) | ORAL | 11 refills | Status: DC

## 2017-11-22 MED ORDER — ICOSAPENT ETHYL 1 G PO CAPS: 2.0000 | ORAL_CAPSULE | Freq: Two times a day (BID) | ORAL | 3 refills | Status: DC

## 2017-11-22 NOTE — Assessment & Plan Note (Signed)
Vit D 

## 2017-11-22 NOTE — Assessment & Plan Note (Signed)
Can try CBD oil

## 2017-11-22 NOTE — Assessment & Plan Note (Signed)
Prozac,  Lorazepam prn  Potential benefits of a long term benzodiazepines  use as well as potential risks  and complications were explained to the patient and were aknowledged.

## 2017-11-22 NOTE — Progress Notes (Signed)
Subjective:  Patient ID: Sharon Paul, female    DOB: Jan 17, 1946  Age: 72 y.o. MRN: [id number]  CC: No chief complaint on file.   HPI Sharon Paul presents for HTN, OA, dyslipidemia f/u  Outpatient Medications Prior to Visit  Medication Sig Dispense Refill  . aspirin 81 MG tablet Take 81 mg by mouth daily.    . benzonatate (TESSALON) 200 MG capsule Take 1 capsule (200 mg total) by mouth 3 (three) times daily as needed. 60 capsule 0  . Cholecalciferol 2000 UNITS TABS Take 1 tablet by mouth daily.    [name] conjugated estrogens (PREMARIN) vaginal cream Place 1 Applicatorful vaginally daily. 42.5 g 3  . FLUoxetine (PROZAC) 10 MG capsule TAKE 1 CAPSULE BY MOUTH  DAILY 90 capsule 2  . fluticasone (FLONASE) 50 MCG/ACT nasal spray PLACE 2 SPRAYS INTO THE NOSE DAILY. 16 g 2  . ibuprofen (ADVIL,MOTRIN) 600 MG tablet TAKE 1 TABLET (600 MG TOTAL) BY MOUTH EVERY 8 (EIGHT) HOURS AS NEEDED FOR PAIN. 90 tablet 3  . ipratropium (ATROVENT) 0.03 % nasal spray Place 2 sprays into both nostrils 2 (two) times daily. Do not use for more than 5days. 30 mL 0  . irbesartan (AVAPRO) 300 MG tablet Take 0.5 tablets (150 mg total) by mouth 2 (two) times daily. 90 tablet 3  . labetalol (NORMODYNE) 300 MG tablet Take 1 tablet (300 mg total) by mouth 2 (two) times daily. 180 tablet 3  . LORazepam (ATIVAN) 0.5 MG tablet TAKE 1 TABLET BY MOUTH TWICE A DAY 60 tablet 3  . nitrofurantoin, macrocrystal-monohydrate, (MACROBID) 100 MG capsule Take 1 capsule (100 mg total) by mouth 2 (two) times daily. For 7 days 14 capsule 0  . NONFORMULARY OR COMPOUNDED ITEM Estradiol 3.78% prefilled applicators Insert one appful vaginally twice weekly. 8 each 5  . pantoprazole (PROTONIX) 40 MG tablet Take 1 tablet (40 mg total) by mouth daily. 30 tablet 0  . rosuvastatin (CRESTOR) 10 MG tablet TAKE 1 TABLET BY MOUTH  DAILY 90 tablet 3  . tiZANidine (ZANAFLEX) 4 MG tablet Take 1 tablet (4 mg total) by mouth every 6 (six) hours  as needed for muscle spasms. 30 tablet 1  . valACYclovir (VALTREX) 500 MG tablet Take twice daily for 3-5 days 30 tablet 1   No facility-administered medications prior to visit.     ROS Review of Systems  Constitutional: Negative for activity change, appetite change, chills, fatigue and unexpected weight change.  HENT: Negative for congestion, mouth sores and sinus pressure.   Eyes: Negative for visual disturbance.  Respiratory: Negative for cough and chest tightness.   Gastrointestinal: Negative for abdominal pain and nausea.  Genitourinary: Negative for difficulty urinating, frequency and vaginal pain.  Musculoskeletal: Negative for back pain and gait problem.  Skin: Negative for pallor and rash.  Neurological: Negative for dizziness, tremors, weakness, numbness and headaches.  Psychiatric/Behavioral: Negative for confusion and sleep disturbance.    Objective:  BP 134/76 (BP Location: Left Arm, Patient Position: Sitting, Cuff Size: Normal)   Pulse 61   Temp 98.7 F (37.1 C) (Oral)   Ht 4\' 10"  (1.473 m)   Wt 133 lb (60.3 kg)   SpO2 99%   BMI 27.80 kg/m   BP Readings from Last 3 Encounters:  11/22/17 134/76  11/07/17 134/80  10/11/17 124/74    Wt Readings from Last 3 Encounters:  11/22/17 133 lb (60.3 kg)  11/07/17 130 lb (59 kg)  09/11/17 131 lb (59.4 kg)    Physical Exam  Constitutional: She appears well-developed. No distress.  HENT:  Head: Normocephalic.  Right Ear: External ear normal.  Left Ear: External ear normal.  Nose: Nose normal.  Mouth/Throat: Oropharynx is clear and moist.  Eyes: Pupils are equal, round, and reactive to light. Conjunctivae are normal. Right eye exhibits no discharge. Left eye exhibits no discharge.  Neck: Normal range of motion. Neck supple. No JVD present. No tracheal deviation present. No thyromegaly present.  Cardiovascular: Normal rate, regular rhythm and normal heart sounds.  Pulmonary/Chest: No stridor. No respiratory  distress. She has no wheezes.  Abdominal: Soft. Bowel sounds are normal. She exhibits no distension and no mass. There is no tenderness. There is no rebound and no guarding.  Musculoskeletal: She exhibits no edema or tenderness.  Lymphadenopathy:    She has no cervical adenopathy.  Neurological: She displays normal reflexes. No cranial nerve deficit. She exhibits normal muscle tone. Coordination normal.  Skin: No rash noted. No erythema.  Psychiatric: She has a normal mood and affect. Her behavior is normal. Judgment and thought content normal.    Lab Results  Component Value Date   WBC 3.6 (L) 09/08/2016   HGB 12.8 09/08/2016   HCT 36.9 09/08/2016   PLT 263.0 09/08/2016   GLUCOSE 106 (H) 07/27/2017   CHOL 173 03/15/2017   TRIG 157.0 (H) 03/15/2017   HDL 57.60 03/15/2017   LDLDIRECT 138.2 10/22/2010   LDLCALC 84 03/15/2017   ALT 16 11/16/2016   AST 16 11/16/2016   NA 139 07/27/2017   K 4.4 07/27/2017   CL 103 07/27/2017   CREATININE 0.80 07/27/2017   BUN 16 07/27/2017   CO2 29 07/27/2017   TSH 1.68 09/08/2016   HGBA1C 6.1 03/15/2017    No results found.  Assessment & Plan:   There are no diagnoses linked to this encounter. I am having Sharon Paul maintain her fluticasone, aspirin, ibuprofen, Cholecalciferol, valACYclovir, LORazepam, ipratropium, tiZANidine, irbesartan, labetalol, pantoprazole, conjugated estrogens, nitrofurantoin (macrocrystal-monohydrate), NONFORMULARY OR COMPOUNDED ITEM, benzonatate, FLUoxetine, and rosuvastatin.  No orders of the defined types were placed in this encounter.    Follow-up: No follow-ups on file.  Walker Kehr, MD

## 2017-11-22 NOTE — Assessment & Plan Note (Signed)
Labetalol, Irbesartan 

## 2017-12-07 DIAGNOSIS — R0683 Snoring: Secondary | ICD-10-CM | POA: Diagnosis not present

## 2018-01-31 ENCOUNTER — Telehealth: Payer: Self-pay | Admitting: Internal Medicine

## 2018-01-31 DIAGNOSIS — M19041 Primary osteoarthritis, right hand: Secondary | ICD-10-CM

## 2018-01-31 DIAGNOSIS — M19049 Primary osteoarthritis, unspecified hand: Secondary | ICD-10-CM

## 2018-01-31 NOTE — Telephone Encounter (Signed)
Copied from [id number]. Topic: Referral - Request >> Jan 31, 2018  2:39 PM Robina Ade, Helene Kelp D wrote: Reason for CRM: Patient would like a referral to see a rheumatologist in [place] for arthritis on her hands and fingers. Pt would like to go and see Dr. Tennis Ship on [address] If her insurance covers it. Please call patient back, thanks.

## 2018-02-01 NOTE — Telephone Encounter (Signed)
Please advise about referral 

## 2018-02-01 NOTE — Telephone Encounter (Signed)
Okay.  I will refer.  Thank you

## 2018-02-14 ENCOUNTER — Other Ambulatory Visit (INDEPENDENT_AMBULATORY_CARE_PROVIDER_SITE_OTHER): Payer: Medicare Other

## 2018-02-14 DIAGNOSIS — E785 Hyperlipidemia, unspecified: Secondary | ICD-10-CM

## 2018-02-14 DIAGNOSIS — I10 Essential (primary) hypertension: Secondary | ICD-10-CM

## 2018-02-14 LAB — HEPATIC FUNCTION PANEL
ALBUMIN: 4.4 g/dL (ref 3.5–5.2)
ALT: 18 U/L (ref 0–35)
AST: 18 U/L (ref 0–37)
Alkaline Phosphatase: 83 U/L (ref 39–117)
BILIRUBIN DIRECT: 0.1 mg/dL (ref 0.0–0.3)
TOTAL PROTEIN: 6.9 g/dL (ref 6.0–8.3)
Total Bilirubin: 0.5 mg/dL (ref 0.2–1.2)

## 2018-02-14 LAB — BASIC METABOLIC PANEL
BUN: 12 mg/dL (ref 6–23)
CO2: 29 mEq/L (ref 19–32)
CREATININE: 0.8 mg/dL (ref 0.40–1.20)
Calcium: 9.6 mg/dL (ref 8.4–10.5)
Chloride: 105 mEq/L (ref 96–112)
GFR: 75.08 mL/min (ref >60.00)
Glucose, Bld: 111 mg/dL — ABNORMAL HIGH (ref 70–99)
POTASSIUM: 4.3 meq/L (ref 3.5–5.1)
Sodium: 141 mEq/L (ref 135–145)

## 2018-02-14 LAB — LIPID PANEL
CHOL/HDL RATIO: 4
Cholesterol: 214 mg/dL — ABNORMAL HIGH (ref 0–200)
HDL: 59.5 mg/dL (ref >39.00)
LDL CALC: 125 mg/dL — AB (ref 0–99)
NonHDL: 154.12
TRIGLYCERIDES: 147 mg/dL (ref 0.0–149.0)
VLDL: 29.4 mg/dL (ref 0.0–40.0)

## 2018-02-21 ENCOUNTER — Other Ambulatory Visit (INDEPENDENT_AMBULATORY_CARE_PROVIDER_SITE_OTHER): Payer: Medicare Other

## 2018-02-21 ENCOUNTER — Ambulatory Visit (INDEPENDENT_AMBULATORY_CARE_PROVIDER_SITE_OTHER): Payer: Medicare Other | Admitting: Internal Medicine

## 2018-02-21 ENCOUNTER — Encounter: Payer: Self-pay | Admitting: Internal Medicine

## 2018-02-21 DIAGNOSIS — E559 Vitamin D deficiency, unspecified: Secondary | ICD-10-CM | POA: Diagnosis not present

## 2018-02-21 DIAGNOSIS — I10 Essential (primary) hypertension: Secondary | ICD-10-CM | POA: Diagnosis not present

## 2018-02-21 DIAGNOSIS — M19049 Primary osteoarthritis, unspecified hand: Secondary | ICD-10-CM

## 2018-02-21 LAB — CBC WITH DIFFERENTIAL/PLATELET
BASOS ABS: 0 10*3/uL (ref 0.0–0.1)
Basophils Relative: 0.9 % (ref 0.0–3.0)
Eosinophils Absolute: 0.1 10*3/uL (ref 0.0–0.7)
Eosinophils Relative: 2.5 % (ref 0.0–5.0)
HCT: 35.7 % — ABNORMAL LOW (ref 36.0–46.0)
Hemoglobin: 12.1 g/dL (ref 12.0–15.0)
LYMPHS ABS: 1.6 10*3/uL (ref 0.7–4.0)
Lymphocytes Relative: 39.5 % (ref 12.0–46.0)
MCHC: 33.8 g/dL (ref 30.0–36.0)
MCV: 89.5 fl (ref 78.0–100.0)
MONO ABS: 0.4 10*3/uL (ref 0.1–1.0)
MONOS PCT: 9.1 % (ref 3.0–12.0)
Neutro Abs: 1.9 10*3/uL (ref 1.4–7.7)
Neutrophils Relative %: 48 % (ref 43.0–77.0)
Platelets: 245 10*3/uL (ref 150.0–400.0)
RBC: 3.99 Mil/uL (ref 3.87–5.11)
RDW: 13.3 % (ref 11.5–15.5)
WBC: 4 10*3/uL (ref 4.0–10.5)

## 2018-02-21 LAB — BASIC METABOLIC PANEL
BUN: 18 mg/dL (ref 6–23)
CO2: 28 mEq/L (ref 19–32)
Calcium: 9.8 mg/dL (ref 8.4–10.5)
Chloride: 102 mEq/L (ref 96–112)
Creatinine, Ser: 0.96 mg/dL (ref 0.40–1.20)
GFR: 60.83 mL/min (ref >60.00)
GLUCOSE: 91 mg/dL (ref 70–99)
POTASSIUM: 4.5 meq/L (ref 3.5–5.1)
SODIUM: 137 meq/L (ref 135–145)

## 2018-02-21 LAB — SEDIMENTATION RATE: Sed Rate: 4 mm/hr (ref 0–30)

## 2018-02-21 MED ORDER — IBUPROFEN 600 MG PO TABS: ORAL_TABLET | ORAL | 3 refills | Status: DC

## 2018-02-21 MED ORDER — METHYLPREDNISOLONE 4 MG PO TBPK
ORAL_TABLET | ORAL | 0 refills | Status: DC
Start: 2018-02-21 — End: 2018-03-28

## 2018-02-21 NOTE — Progress Notes (Signed)
Subjective:  Patient ID: Sharon Paul, female    DOB: 03-12-1946  Age: 72 y.o. MRN: [id number]  CC: No chief complaint on file.   HPI Tequisha Maahs presents for HTN, anxiety, OA f/u OA is bad - dropping things  Outpatient Medications Prior to Visit  Medication Sig Dispense Refill  . aspirin 81 MG tablet Take 81 mg by mouth daily.    . benzonatate (TESSALON) 200 MG capsule Take 1 capsule (200 mg total) by mouth 3 (three) times daily as needed. 60 capsule 0  . Cholecalciferol 2000 UNITS TABS Take 1 tablet by mouth daily.    [name] FLUoxetine (PROZAC) 10 MG capsule TAKE 1 CAPSULE BY MOUTH  DAILY 90 capsule 2  . fluticasone (FLONASE) 50 MCG/ACT nasal spray PLACE 2 SPRAYS INTO THE NOSE DAILY. 48 g 3  . ibuprofen (ADVIL,MOTRIN) 600 MG tablet TAKE 1 TABLET (600 MG TOTAL) BY MOUTH EVERY 8 (EIGHT) HOURS AS NEEDED FOR PAIN. 90 tablet 3  . ipratropium (ATROVENT) 0.03 % nasal spray Place 2 sprays into both nostrils 2 (two) times daily. Do not use for more than 5days. 30 mL 0  . irbesartan (AVAPRO) 300 MG tablet Take 0.5 tablets (150 mg total) by mouth 2 (two) times daily. 90 tablet 3  . labetalol (NORMODYNE) 300 MG tablet Take 1 tablet (300 mg total) by mouth 2 (two) times daily. 180 tablet 3  . LORazepam (ATIVAN) 0.5 MG tablet TAKE 1 TABLET BY MOUTH TWICE A DAY 60 tablet 3  . nitrofurantoin, macrocrystal-monohydrate, (MACROBID) 100 MG capsule Take 1 capsule (100 mg total) by mouth 2 (two) times daily. For 7 days 14 capsule 0  . NONFORMULARY OR COMPOUNDED ITEM Estradiol 0.86% prefilled applicators Insert one appful vaginally twice weekly. 8 each 5  . pantoprazole (PROTONIX) 40 MG tablet Take 1 tablet (40 mg total) by mouth daily. 30 tablet 0  . rosuvastatin (CRESTOR) 10 MG tablet TAKE 1 TABLET BY MOUTH  DAILY 90 tablet 3  . tiZANidine (ZANAFLEX) 4 MG tablet Take 1 tablet (4 mg total) by mouth every 6 (six) hours as needed for muscle spasms. 30 tablet 1  . valACYclovir (VALTREX) 500 MG  tablet Take twice daily for 3-5 days 30 tablet 1  . conjugated estrogens (PREMARIN) vaginal cream Place 1 Applicatorful vaginally daily. (Patient not taking: Reported on 02/21/2018) 42.5 g 3  . Icosapent Ethyl (VASCEPA) 1 g CAPS Take 2 capsules (2 g total) by mouth 2 (two) times daily. (Patient not taking: Reported on 02/21/2018) 360 capsule 3   No facility-administered medications prior to visit.     ROS: Review of Systems  Constitutional: Negative for activity change, appetite change, chills, fatigue and unexpected weight change.  HENT: Negative for congestion, mouth sores and sinus pressure.   Eyes: Negative for visual disturbance.  Respiratory: Negative for cough and chest tightness.   Gastrointestinal: Negative for abdominal pain and nausea.  Genitourinary: Negative for difficulty urinating, frequency and vaginal pain.  Musculoskeletal: Positive for arthralgias. Negative for back pain and gait problem.  Skin: Negative for pallor and rash.  Neurological: Negative for dizziness, tremors, weakness, numbness and headaches.  Psychiatric/Behavioral: Negative for confusion and sleep disturbance.    Objective:  BP 122/72 (BP Location: Left Arm, Patient Position: Sitting, Cuff Size: Normal)   Pulse (!) 59   Temp 98 F (36.7 C) (Oral)   Ht 4\' 10"  (1.473 m)   Wt 133 lb (60.3 kg)   SpO2 98%   BMI 27.80 kg/m   BP Readings  from Last 3 Encounters:  02/21/18 122/72  11/22/17 134/76  11/07/17 134/80    Wt Readings from Last 3 Encounters:  02/21/18 133 lb (60.3 kg)  11/22/17 133 lb (60.3 kg)  11/07/17 130 lb (59 kg)    Physical Exam  Constitutional: She appears well-developed. No distress.  HENT:  Head: Normocephalic.  Right Ear: External ear normal.  Left Ear: External ear normal.  Nose: Nose normal.  Mouth/Throat: Oropharynx is clear and moist.  Eyes: Pupils are equal, round, and reactive to light. Conjunctivae are normal. Right eye exhibits no discharge. Left eye exhibits no  discharge.  Neck: Normal range of motion. Neck supple. No JVD present. No tracheal deviation present. No thyromegaly present.  Cardiovascular: Normal rate, regular rhythm and normal heart sounds.  Pulmonary/Chest: No stridor. No respiratory distress. She has no wheezes.  Abdominal: Soft. Bowel sounds are normal. She exhibits no distension and no mass. There is no tenderness. There is no rebound and no guarding.  Musculoskeletal: She exhibits tenderness. She exhibits no edema.  Lymphadenopathy:    She has no cervical adenopathy.  Neurological: She displays normal reflexes. No cranial nerve deficit. She exhibits normal muscle tone. Coordination normal.  Skin: No rash noted. No erythema.  Psychiatric: She has a normal mood and affect. Her behavior is normal. Judgment and thought content normal.  hands - tender  Lab Results  Component Value Date   WBC 3.6 (L) 09/08/2016   HGB 12.8 09/08/2016   HCT 36.9 09/08/2016   PLT 263.0 09/08/2016   GLUCOSE 111 (H) 02/14/2018   CHOL 214 (H) 02/14/2018   TRIG 147.0 02/14/2018   HDL 59.50 02/14/2018   LDLDIRECT 138.2 10/22/2010   LDLCALC 125 (H) 02/14/2018   ALT 18 02/14/2018   AST 18 02/14/2018   NA 141 02/14/2018   K 4.3 02/14/2018   CL 105 02/14/2018   CREATININE 0.80 02/14/2018   BUN 12 02/14/2018   CO2 29 02/14/2018   TSH 1.68 09/08/2016   HGBA1C 6.1 03/15/2017    No results found.  Assessment & Plan:   There are no diagnoses linked to this encounter.   No orders of the defined types were placed in this encounter.    Follow-up: No follow-ups on file.  Walker Kehr, MD

## 2018-02-21 NOTE — Assessment & Plan Note (Signed)
Vit D 

## 2018-02-21 NOTE — Assessment & Plan Note (Signed)
BP Readings from Last 3 Encounters:  02/21/18 122/72  11/22/17 134/76  11/07/17 134/80

## 2018-02-21 NOTE — Patient Instructions (Addendum)
Brix jar opener Grip pads  Hold Crestor Medrol dose pack if worse

## 2018-02-21 NOTE — Assessment & Plan Note (Signed)
Worse - likely OA Hold Crestor Medrol dose pack if worse

## 2018-02-22 LAB — RHEUMATOID FACTOR

## 2018-02-26 ENCOUNTER — Telehealth: Payer: Self-pay | Admitting: Internal Medicine

## 2018-02-26 NOTE — Telephone Encounter (Signed)
There is no such thing as coded ibuprofen.  Duexis is not usually covered by insurance.  The patient can take over-the-counter Nexium with ibuprofen-it should create stomach lining protection.  If problems-please stop ibuprofen. Thank you

## 2018-02-26 NOTE — Telephone Encounter (Signed)
Copied from [place] [id number]. Topic: General - Other >> Feb 26, 2018 12:45 PM Cecelia Byars, NT wrote: Reason for CRM: The patient called and said she had been having stomach  issues with the  ibuprofen (ADVIL,MOTRIN) 600 MG tablet she says she needs for it to be coated and she was able to take it for 4 days and it caused her pain ,and fatigue ? In her stomach  she has stopped taking it until she is able to get the coated  , she would like it called in  to  the [id number] [place], [place]. [phone] (Phone) [phone] (Fax)

## 2018-02-26 NOTE — Telephone Encounter (Signed)
Please advise- what can she take? Duexis ?

## 2018-02-27 NOTE — Telephone Encounter (Signed)
Pt informed of below.  

## 2018-03-20 DIAGNOSIS — M25551 Pain in right hip: Secondary | ICD-10-CM | POA: Diagnosis not present

## 2018-03-20 DIAGNOSIS — M7061 Trochanteric bursitis, right hip: Secondary | ICD-10-CM | POA: Diagnosis not present

## 2018-03-28 ENCOUNTER — Encounter: Payer: Self-pay | Admitting: Gynecology

## 2018-03-28 ENCOUNTER — Ambulatory Visit: Payer: Medicare Other | Admitting: Gynecology

## 2018-03-28 VITALS — BP 128/80 | Ht <= 58 in | Wt 131.0 lb

## 2018-03-28 DIAGNOSIS — N952 Postmenopausal atrophic vaginitis: Secondary | ICD-10-CM | POA: Diagnosis not present

## 2018-03-28 DIAGNOSIS — M810 Age-related osteoporosis without current pathological fracture: Secondary | ICD-10-CM

## 2018-03-28 DIAGNOSIS — Z01419 Encounter for gynecological examination (general) (routine) without abnormal findings: Secondary | ICD-10-CM

## 2018-03-28 MED ORDER — IBUPROFEN 800 MG PO TABS: 800.0000 mg | ORAL_TABLET | Freq: Three times a day (TID) | ORAL | 1 refills | Status: DC | PRN

## 2018-03-28 NOTE — Patient Instructions (Signed)
Follow-up in 1 year for annual exam 

## 2018-03-28 NOTE — Progress Notes (Signed)
    Sharon Paul [birth year] [id number]        72 y.o.  [id number] for annual gynecologic exam.  Without gynecologic complaints.  Several issues noted below.  Past medical history,surgical history, problem list, medications, allergies, family history and social history were all reviewed and documented as reviewed in the EPIC chart.  ROS:  Performed with pertinent positives and negatives included in the history, assessment and plan.   Additional significant findings : None   Exam: Wandra Scot assistant Vitals:   03/28/18 1445  BP: 128/80  Weight: 131 lb (59.4 kg)  Height: 4\' 10"  (1.473 m)   Body mass index is 27.38 kg/m.  General appearance:  Normal affect, orientation and appearance. Skin: Grossly normal HEENT: Without gross lesions.  No cervical or supraclavicular adenopathy. Thyroid normal.  Lungs:  Clear without wheezing, rales or rhonchi Cardiac: RR, without RMG Abdominal:  Soft, nontender, without masses, guarding, rebound, organomegaly or hernia Breasts:  Examined lying and sitting without masses, retractions, discharge or axillary adenopathy. Pelvic:  Ext, BUS, Vagina: With atrophic changes  Adnexa: Without masses or tenderness    Anus and perineum: Normal   Rectovaginal: Normal sphincter tone without palpated masses or tenderness.    Assessment/Plan:  72 y.o. [id number] female for annual gynecologic exam.  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  1. Postmenopausal/atrophic genital changes.  Had been on estradiol patches but discontinued.  Is using estradiol vaginal cream twice weekly for vaginal dryness which is helping.  It also seems to help with her urinary incontinence symptoms.  Issues and risks to include absorption with systemic effects reviewed.  Patient has supply from [facility] but will call when needs more. 2. History of urinary incontinence.  Seems better with using the estradiol cream.  Still having some issues.  Had arranged to be  seen by the urologist but they ordered fairly expensive testing that she did not want to go through and has decided at this point to wait on any further evaluation. 3. Osteopenia.  DEXA 2018 T score -1.8 FRAX 6% / 1.2%.  Stable from prior DEXA. 4. Colonoscopy 2014.  Repeat at their recommended interval. 5. Pap smear 2012.  No Pap smear done today.  No history of significant abnormal Pap smears.  We discussed current screening guidelines and both agree to stop screening based on age and hysterectomy history. 6. Mammography 05/2017.  Patient will schedule this coming October.  Exam normal today. 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  Uses Valtrex twice daily for several days when needed.  Has supply but will call when needs more. 8. Health maintenance.  No routine lab work done as this is done elsewhere.  She asked if I would prescribe prescription strength ibuprofen.  Has issues with arthritis and is taking OTC ibuprofen.  Ibuprofen 800 mg #60 with 1 refill provided.  Follow-up in 1 year, sooner as needed.   Anastasio Auerbach MD, 3:08 PM 03/28/2018

## 2018-05-15 ENCOUNTER — Other Ambulatory Visit: Payer: Self-pay | Admitting: Internal Medicine

## 2018-05-22 DIAGNOSIS — H25813 Combined forms of age-related cataract, bilateral: Secondary | ICD-10-CM | POA: Diagnosis not present

## 2018-05-22 DIAGNOSIS — H43812 Vitreous degeneration, left eye: Secondary | ICD-10-CM | POA: Diagnosis not present

## 2018-05-22 DIAGNOSIS — H43392 Other vitreous opacities, left eye: Secondary | ICD-10-CM | POA: Diagnosis not present

## 2018-05-28 NOTE — Progress Notes (Addendum)
Subjective:   Sharon Paul is a 72 y.o. female who presents for Medicare Annual (Subsequent) preventive examination.  Review of Systems:  No ROS.  Medicare Wellness Visit. Additional risk factors are reflected in the social history.  Cardiac Risk Factors include: advanced age (>76men, >3 women);dyslipidemia;hypertension Sleep patterns: feels rested on waking, gets up 1 times nightly to void and sleeps 7 hours nightly.    Home Safety/Smoke Alarms: Feels safe in home. Smoke alarms in place.  Living environment; residence and Firearm Safety: 1-story house/ trailer, no firearms. Lives with husband, no needs for DME, good support system Seat Belt Safety/Bike Helmet: Wears seat belt.      Objective:     Vitals: BP 126/64   Pulse 60   Resp 17   Ht 4\' 10"  (1.473 m)   Wt 133 lb (60.3 kg)   SpO2 98%   BMI 27.80 kg/m   Body mass index is 27.8 kg/m.  Advanced Directives 05/29/2018  Does Patient Have a Medical Advance Directive? No  Does patient want to make changes to medical advance directive? Yes (ED - Information included in AVS)    Tobacco Social History   Tobacco Use  Smoking Status Former Smoker  . Last attempt to quit: 02/11/1970  . Years since quitting: 48.3  Smokeless Tobacco Never Used     Counseling given: Not Answered  Past Medical History:  Diagnosis Date  . Celiac sprue 2007  . CVD (cardiovascular disease)   . Foot fracture, left 2011  . GERD (gastroesophageal reflux disease)   . Hyperlipidemia   . Osteopenia 03/2017   T score -1.8 FRAX 6%/1.2%. Stable from prior DEXA  . PVD (peripheral vascular disease) (HCC)    mild carotid ? fibromuscular dysplasia  . Urinary incontinence    Past Surgical History:  Procedure Laterality Date  . APPENDECTOMY    . BREAST SURGERY     right breast biopsy  . ESOPHAGOGASTRODUODENOSCOPY    . ROTATOR CUFF REPAIR     RIGHT  . stress cardiolite  04-18-00  . VAGINAL HYSTERECTOMY  1993   leiomyomata   Family  History  Problem Relation Age of Onset  . Glaucoma Mother   . Heart disease Mother   . Hypertension Mother   . Heart disease Father   . Hypertension Father   . Stroke Father   . Hypertension Paternal Grandmother   . Lung cancer Paternal Grandmother   . Hypertension Sister   . Hypertension Brother   . Glaucoma Brother   . Colon cancer Neg Hx    Social History   Socioeconomic History  . Marital status: Married    Spouse name: Theotis Burrow  . Number of children: Not on file  . Years of education: Not on file  . Highest education level: Not on file  Occupational History    Employer: [employer]  . Financial resource strain: Not hard at all  . Food insecurity:    Worry: Never true    Inability: Never true  . Transportation needs:    Medical: No    Non-medical: No  Tobacco Use  . Smoking status: Former Smoker    Last attempt to quit: 02/11/1970    Years since quitting: 48.3  . Smokeless tobacco: Never Used  Substance and Sexual Activity  . Alcohol use: Yes    Alcohol/week: 0.0 standard drinks    Comment: very rare  . Drug use: No  . Sexual activity: Yes    Birth control/protection:  Surgical, Post-menopausal, None    Comment: 1st intercourse 72 yo-Fewer than 5 partners,des neg  Lifestyle  . Physical activity:    Days per week: 4 days    Minutes per session: 50 min  . Stress: Not at all  Relationships  . Social connections:    Talks on phone: More than three times a week    Gets together: More than three times a week    Attends religious service: More than 4 times per year    Active member of club or organization: Yes    Attends meetings of clubs or organizations: More than 4 times per year    Relationship status: Married  Other Topics Concern  . Not on file  Social History Narrative  . Not on file    Outpatient Encounter Medications as of 05/29/2018  Medication Sig  . aspirin 81 MG tablet Take 81 mg by mouth daily.  . Cholecalciferol 2000  UNITS TABS Take 1 tablet by mouth daily.  [name] FLUoxetine (PROZAC) 10 MG capsule TAKE 1 CAPSULE BY MOUTH  DAILY  . fluticasone (FLONASE) 50 MCG/ACT nasal spray PLACE 2 SPRAYS INTO THE NOSE DAILY.  [name] ibuprofen (ADVIL,MOTRIN) 800 MG tablet Take 1 tablet (800 mg total) by mouth every 8 (eight) hours as needed.  [name] ipratropium (ATROVENT) 0.03 % nasal spray Place 2 sprays into both nostrils 2 (two) times daily. Do not use for more than 5days.  . irbesartan (AVAPRO) 300 MG tablet Take 0.5 tablets (150 mg total) by mouth 2 (two) times daily.  [name] labetalol (NORMODYNE) 300 MG tablet Take 1 tablet (300 mg total) by mouth 2 (two) times daily.  [name] LORazepam (ATIVAN) 0.5 MG tablet TAKE 1 TABLET BY MOUTH TWICE A DAY  . NONFORMULARY OR COMPOUNDED ITEM Estradiol 9.79% prefilled applicators Insert one appful vaginally twice weekly.  . pantoprazole (PROTONIX) 40 MG tablet Take 1 tablet (40 mg total) by mouth daily.  . rosuvastatin (CRESTOR) 10 MG tablet TAKE 1 TABLET BY MOUTH  DAILY  . valACYclovir (VALTREX) 500 MG tablet Take twice daily for 3-5 days  . [DISCONTINUED] pantoprazole (PROTONIX) 40 MG tablet TAKE 1 TABLET BY MOUTH  DAILY (Patient not taking: Reported on 05/29/2018)   No facility-administered encounter medications on file as of 05/29/2018.     Activities of Daily Living In your present state of health, do you have any difficulty performing the following activities: 05/29/2018  Hearing? N  Vision? N  Difficulty concentrating or making decisions? N  Walking or climbing stairs? N  Dressing or bathing? N  Doing errands, shopping? N  Preparing Food and eating ? N  Using the Toilet? N  In the past six months, have you accidently leaked urine? N  Do you have problems with loss of bowel control? N  Managing your Medications? N  Managing your Finances? N  Housekeeping or managing your Housekeeping? N  Some recent data might be hidden    Patient Care Team: Plotnikov, Evie Lacks, MD as PCP -  General Armbruster, Carlota Raspberry, MD as Consulting Physician (Gastroenterology)    Assessment:   This is a routine wellness examination for Sharon Paul. Physical assessment deferred to PCP.   Exercise Activities and Dietary recommendations Current Exercise Habits: Structured exercise class;Home exercise routine, Type of exercise: walking;calisthenics(Silver Sneakers), Time (Minutes): 50, Frequency (Times/Week): 4, Weekly Exercise (Minutes/Week): 200, Intensity: Mild  Diet (meal preparation, eat out, water intake, caffeinated beverages, dairy products, fruits and vegetables): in general, a "healthy" diet  , well balanced.  Reviewed heart healthy diet. Encouraged patient to increase daily water and healthy fluid intake. Relevant patient education assigned to patient using Emmi.  Goals    . Patient Stated     Increase the amount of physical activity by going to the [facility] and walk around the track until I can participate again in [place].       Fall Risk Fall Risk  05/29/2018 11/22/2017 11/16/2016 01/26/2016 11/20/2014  Falls in the past year? No No No No No    Depression Screen PHQ 2/9 Scores 05/29/2018 11/22/2017 11/16/2016 01/26/2016  PHQ - 2 Score 0 0 0 1     Cognitive Function       Ad8 score reviewed for issues:  Issues making decisions: no  Less interest in hobbies / activities: no  Repeats questions, stories (family complaining): no  Trouble using ordinary gadgets (microwave, computer, phone):no  Forgets the month or year: no  Mismanaging finances: no  Remembering appts: no  Daily problems with thinking and/or memory: no Ad8 score is= 0  Immunization History  Administered Date(s) Administered  . Influenza Split 07/04/2012  . Influenza Whole 07/02/2008, 06/01/2010  . Influenza, High Dose Seasonal PF 05/31/2016, 05/26/2017, 05/29/2018  . Influenza,inj,Quad PF,6+ Mos 06/02/2015  . Influenza-Unspecified 06/16/2013, 05/29/2014  . Pneumococcal Conjugate-13 01/14/2015   . Pneumococcal Polysaccharide-23 12/04/2013  . Td 08/02/2000  . Tdap 10/06/2015  . Zoster 09/21/2009   Screening Tests Health Maintenance  Topic Date Due  . MAMMOGRAM  06/07/2019  . COLONOSCOPY  03/07/2023  . TETANUS/TDAP  10/05/2025  . INFLUENZA VACCINE  Completed  . DEXA SCAN  Completed  . Hepatitis C Screening  Completed  . PNA vac Low Risk Adult  Completed      Plan:     Continue doing brain stimulating activities (puzzles, reading, adult coloring books, staying active) to keep memory sharp.   Continue to eat heart healthy diet (full of fruits, vegetables, whole grains, lean protein, water--limit salt, fat, and sugar intake) and increase physical activity as tolerated.  I have personally reviewed and noted the following in the patient's chart:   . Medical and social history . Use of alcohol, tobacco or illicit drugs  . Current medications and supplements . Functional ability and status . Nutritional status . Physical activity . Advanced directives . List of other physicians . Vitals . Screenings to include cognitive, depression, and falls . Referrals and appointments  In addition, I have reviewed and discussed with patient certain preventive protocols, quality metrics, and best practice recommendations. A written personalized care plan for preventive services as well as general preventive health recommendations were provided to patient.     Michiel Cowboy, RN  05/29/2018  Medical screening examination/treatment/procedure(s) were performed by non-physician practitioner and as supervising physician I was immediately available for consultation/collaboration. I agree with above. Lew Dawes, MD

## 2018-05-29 ENCOUNTER — Ambulatory Visit (INDEPENDENT_AMBULATORY_CARE_PROVIDER_SITE_OTHER): Payer: Medicare Other | Admitting: *Deleted

## 2018-05-29 VITALS — BP 126/64 | HR 60 | Resp 17 | Ht <= 58 in | Wt 133.0 lb

## 2018-05-29 DIAGNOSIS — Z23 Encounter for immunization: Secondary | ICD-10-CM

## 2018-05-29 DIAGNOSIS — Z0000 Encounter for general adult medical examination without abnormal findings: Secondary | ICD-10-CM | POA: Diagnosis not present

## 2018-05-29 NOTE — Patient Instructions (Addendum)
Continue doing brain stimulating activities (puzzles, reading, adult coloring books, staying active) to keep memory sharp.   Continue to eat heart healthy diet (full of fruits, vegetables, whole grains, lean protein, water--limit salt, fat, and sugar intake) and increase physical activity as tolerated.   Sharon Paul , Thank you for taking time to come for your Medicare Wellness Visit. I appreciate your ongoing commitment to your health goals. Please review the following plan we discussed and let me know if I can assist you in the future.   These are the goals we discussed: Goals    . Patient Stated     Increase the amount of physical activity by going to the [facility] and walk around the track until I can participate again in [place].       This is a list of the screening recommended for you and due dates:  Health Maintenance  Topic Date Due  . Flu Shot  03/29/2018  . Mammogram  06/07/2019  . Colon Cancer Screening  03/07/2023  . Tetanus Vaccine  10/05/2025  . DEXA scan (bone density measurement)  Completed  .  Hepatitis C: One time screening is recommended by Center for Disease Control  (CDC) for  adults born from [birth year] through 1964.   Completed  . Pneumonia vaccines  Completed   Influenza Virus Vaccine injection What is this medicine? INFLUENZA VIRUS VACCINE (in floo EN zuh VAHY ruhs vak SEEN) helps to reduce the risk of getting influenza also known as the flu. The vaccine only helps protect you against some strains of the flu. This medicine may be used for other purposes; ask your health care provider or pharmacist if you have questions. COMMON BRAND NAME(S): Afluria, Agriflu, Alfuria, FLUAD, Fluarix, Fluarix Quadrivalent, Flublok, Flublok Quadrivalent, FLUCELVAX, Flulaval, Fluvirin, Fluzone, Fluzone High-Dose, Fluzone Intradermal What should I tell my health care provider before I take this medicine? They need to know if you have any of these conditions: -bleeding  disorder like hemophilia -fever or infection -Guillain-Barre syndrome or other neurological problems -immune system problems -infection with the human immunodeficiency virus (HIV) or AIDS -low blood platelet counts -multiple sclerosis -an unusual or allergic reaction to influenza virus vaccine, latex, other medicines, foods, dyes, or preservatives. Different brands of vaccines contain different allergens. Some may contain latex or eggs. Talk to your doctor about your allergies to make sure that you get the right vaccine. -pregnant or trying to get pregnant -breast-feeding How should I use this medicine? This vaccine is for injection into a muscle or under the skin. It is given by a health care professional. A copy of Vaccine Information Statements will be given before each vaccination. Read this sheet carefully each time. The sheet may change frequently. Talk to your healthcare provider to see which vaccines are right for you. Some vaccines should not be used in all age groups. Overdosage: If you think you have taken too much of this medicine contact a poison control center or emergency room at once. NOTE: This medicine is only for you. Do not share this medicine with others. What if I miss a dose? This does not apply. What may interact with this medicine? -chemotherapy or radiation therapy -medicines that lower your immune system like etanercept, anakinra, infliximab, and adalimumab -medicines that treat or prevent blood clots like warfarin -phenytoin -steroid medicines like prednisone or cortisone -theophylline -vaccines This list may not describe all possible interactions. Give your health care provider a list of all the medicines, herbs, non-prescription drugs, or  dietary supplements you use. Also tell them if you smoke, drink alcohol, or use illegal drugs. Some items may interact with your medicine. What should I watch for while using this medicine? Report any side effects that do  not go away within 3 days to your doctor or health care professional. Call your health care provider if any unusual symptoms occur within 6 weeks of receiving this vaccine. You may still catch the flu, but the illness is not usually as bad. You cannot get the flu from the vaccine. The vaccine will not protect against colds or other illnesses that may cause fever. The vaccine is needed every year. What side effects may I notice from receiving this medicine? Side effects that you should report to your doctor or health care professional as soon as possible: -allergic reactions like skin rash, itching or hives, swelling of the face, lips, or tongue Side effects that usually do not require medical attention (report to your doctor or health care professional if they continue or are bothersome): -fever -headache -muscle aches and pains -pain, tenderness, redness, or swelling at the injection site -tiredness This list may not describe all possible side effects. Call your doctor for medical advice about side effects. You may report side effects to FDA at 1-800-FDA-1088. Where should I keep my medicine? The vaccine will be given by a health care professional in a clinic, pharmacy, doctor's office, or other health care setting. You will not be given vaccine doses to store at home. NOTE: This sheet is a summary. It may not cover all possible information. If you have questions about this medicine, talk to your doctor, pharmacist, or health care provider.  2018 Elsevier/Gold Standard (2015-03-06 10:07:28)   Health Maintenance, Female Adopting a healthy lifestyle and getting preventive care can go a long way to promote health and wellness. Talk with your health care provider about what schedule of regular examinations is right for you. This is a good chance for you to check in with your provider about disease prevention and staying healthy. In between checkups, there are plenty of things you can do on your  own. Experts have done a lot of research about which lifestyle changes and preventive measures are most likely to keep you healthy. Ask your health care provider for more information. Weight and diet Eat a healthy diet  Be sure to include plenty of vegetables, fruits, low-fat dairy products, and lean protein.  Do not eat a lot of foods high in solid fats, added sugars, or salt.  Get regular exercise. This is one of the most important things you can do for your health. ? Most adults should exercise for at least 150 minutes each week. The exercise should increase your heart rate and make you sweat (moderate-intensity exercise). ? Most adults should also do strengthening exercises at least twice a week. This is in addition to the moderate-intensity exercise.  Maintain a healthy weight  Body mass index (BMI) is a measurement that can be used to identify possible weight problems. It estimates body fat based on height and weight. Your health care provider can help determine your BMI and help you achieve or maintain a healthy weight.  For females 88 years of age and older: ? A BMI below 18.5 is considered underweight. ? A BMI of 18.5 to 24.9 is normal. ? A BMI of 25 to 29.9 is considered overweight. ? A BMI of 30 and above is considered obese.  Watch levels of cholesterol and blood lipids  You should start having your blood tested for lipids and cholesterol at 71 years of age, then have this test every 5 years.  You may need to have your cholesterol levels checked more often if: ? Your lipid or cholesterol levels are high. ? You are older than 71 years of age. ? You are at high risk for heart disease.  Cancer screening Lung Cancer  Lung cancer screening is recommended for adults 47-14 years old who are at high risk for lung cancer because of a history of smoking.  A yearly low-dose CT scan of the lungs is recommended for people who: ? Currently smoke. ? Have quit within the past 15  years. ? Have at least a 30-pack-year history of smoking. A pack year is smoking an average of one pack of cigarettes a day for 1 year.  Yearly screening should continue until it has been 15 years since you quit.  Yearly screening should stop if you develop a health problem that would prevent you from having lung cancer treatment.  Breast Cancer  Practice breast self-awareness. This means understanding how your breasts normally appear and feel.  It also means doing regular breast self-exams. Let your health care provider know about any changes, no matter how small.  If you are in your 20s or 30s, you should have a clinical breast exam (CBE) by a health care provider every 1-3 years as part of a regular health exam.  If you are 30 or older, have a CBE every year. Also consider having a breast X-ray (mammogram) every year.  If you have a family history of breast cancer, talk to your health care provider about genetic screening.  If you are at high risk for breast cancer, talk to your health care provider about having an MRI and a mammogram every year.  Breast cancer gene (BRCA) assessment is recommended for women who have family members with BRCA-related cancers. BRCA-related cancers include: ? Breast. ? Ovarian. ? Tubal. ? Peritoneal cancers.  Results of the assessment will determine the need for genetic counseling and BRCA1 and BRCA2 testing.  Cervical Cancer Your health care provider may recommend that you be screened regularly for cancer of the pelvic organs (ovaries, uterus, and vagina). This screening involves a pelvic examination, including checking for microscopic changes to the surface of your cervix (Pap test). You may be encouraged to have this screening done every 3 years, beginning at age 32.  For women ages 45-65, health care providers may recommend pelvic exams and Pap testing every 3 years, or they may recommend the Pap and pelvic exam, combined with testing for human  papilloma virus (HPV), every 5 years. Some types of HPV increase your risk of cervical cancer. Testing for HPV may also be done on women of any age with unclear Pap test results.  Other health care providers may not recommend any screening for nonpregnant women who are considered low risk for pelvic cancer and who do not have symptoms. Ask your health care provider if a screening pelvic exam is right for you.  If you have had past treatment for cervical cancer or a condition that could lead to cancer, you need Pap tests and screening for cancer for at least 20 years after your treatment. If Pap tests have been discontinued, your risk factors (such as having a new sexual partner) need to be reassessed to determine if screening should resume. Some women have medical problems that increase the chance of getting cervical cancer. In these  cases, your health care provider may recommend more frequent screening and Pap tests.  Colorectal Cancer  This type of cancer can be detected and often prevented.  Routine colorectal cancer screening usually begins at 71 years of age and continues through 71 years of age.  Your health care provider may recommend screening at an earlier age if you have risk factors for colon cancer.  Your health care provider may also recommend using home test kits to check for hidden blood in the stool.  A small camera at the end of a tube can be used to examine your colon directly (sigmoidoscopy or colonoscopy). This is done to check for the earliest forms of colorectal cancer.  Routine screening usually begins at age 60.  Direct examination of the colon should be repeated every 5-10 years through 71 years of age. However, you may need to be screened more often if early forms of precancerous polyps or small growths are found.  Skin Cancer  Check your skin from head to toe regularly.  Tell your health care provider about any new moles or changes in moles, especially if there is  a change in a mole's shape or color.  Also tell your health care provider if you have a mole that is larger than the size of a pencil eraser.  Always use sunscreen. Apply sunscreen liberally and repeatedly throughout the day.  Protect yourself by wearing long sleeves, pants, a wide-brimmed hat, and sunglasses whenever you are outside.  Heart disease, diabetes, and high blood pressure  High blood pressure causes heart disease and increases the risk of stroke. High blood pressure is more likely to develop in: ? People who have blood pressure in the high end of the normal range (130-139/85-89 mm Hg). ? People who are overweight or obese. ? People who are African American.  If you are 74-32 years of age, have your blood pressure checked every 3-5 years. If you are 71 years of age or older, have your blood pressure checked every year. You should have your blood pressure measured twice-once when you are at a hospital or clinic, and once when you are not at a hospital or clinic. Record the average of the two measurements. To check your blood pressure when you are not at a hospital or clinic, you can use: ? An automated blood pressure machine at a pharmacy. ? A home blood pressure monitor.  If you are between 87 years and 27 years old, ask your health care provider if you should take aspirin to prevent strokes.  Have regular diabetes screenings. This involves taking a blood sample to check your fasting blood sugar level. ? If you are at a normal weight and have a low risk for diabetes, have this test once every three years after 71 years of age. ? If you are overweight and have a high risk for diabetes, consider being tested at a younger age or more often. Preventing infection Hepatitis B  If you have a higher risk for hepatitis B, you should be screened for this virus. You are considered at high risk for hepatitis B if: ? You were born in a country where hepatitis B is common. Ask your health  care provider which countries are considered high risk. ? Your parents were born in a high-risk country, and you have not been immunized against hepatitis B (hepatitis B vaccine). ? You have HIV or AIDS. ? You use needles to inject street drugs. ? You live with someone who has  hepatitis B. ? You have had sex with someone who has hepatitis B. ? You get hemodialysis treatment. ? You take certain medicines for conditions, including cancer, organ transplantation, and autoimmune conditions.  Hepatitis C  Blood testing is recommended for: ? Everyone born from [birth year] through 1964. ? Anyone with known risk factors for hepatitis C.  Sexually transmitted infections (STIs)  You should be screened for sexually transmitted infections (STIs) including gonorrhea and chlamydia if: ? You are sexually active and are younger than 71 years of age. ? You are older than 71 years of age and your health care provider tells you that you are at risk for this type of infection. ? Your sexual activity has changed since you were last screened and you are at an increased risk for chlamydia or gonorrhea. Ask your health care provider if you are at risk.  If you do not have HIV, but are at risk, it may be recommended that you take a prescription medicine daily to prevent HIV infection. This is called pre-exposure prophylaxis (PrEP). You are considered at risk if: ? You are sexually active and do not regularly use condoms or know the HIV status of your partner(s). ? You take drugs by injection. ? You are sexually active with a partner who has HIV.  Talk with your health care provider about whether you are at high risk of being infected with HIV. If you choose to begin PrEP, you should first be tested for HIV. You should then be tested every 3 months for as long as you are taking PrEP. Pregnancy  If you are premenopausal and you may become pregnant, ask your health care provider about preconception counseling.  If you  may become pregnant, take 400 to 800 micrograms (mcg) of folic acid every day.  If you want to prevent pregnancy, talk to your health care provider about birth control (contraception). Osteoporosis and menopause  Osteoporosis is a disease in which the bones lose minerals and strength with aging. This can result in serious bone fractures. Your risk for osteoporosis can be identified using a bone density scan.  If you are 57 years of age or older, or if you are at risk for osteoporosis and fractures, ask your health care provider if you should be screened.  Ask your health care provider whether you should take a calcium or vitamin D supplement to lower your risk for osteoporosis.  Menopause may have certain physical symptoms and risks.  Hormone replacement therapy may reduce some of these symptoms and risks. Talk to your health care provider about whether hormone replacement therapy is right for you. Follow these instructions at home:  Schedule regular health, dental, and eye exams.  Stay current with your immunizations.  Do not use any tobacco products including cigarettes, chewing tobacco, or electronic cigarettes.  If you are pregnant, do not drink alcohol.  If you are breastfeeding, limit how much and how often you drink alcohol.  Limit alcohol intake to no more than 1 drink per day for nonpregnant women. One drink equals 12 ounces of beer, 5 ounces of Dekayla Prestridge, or 1 ounces of hard liquor.  Do not use street drugs.  Do not share needles.  Ask your health care provider for help if you need support or information about quitting drugs.  Tell your health care provider if you often feel depressed.  Tell your health care provider if you have ever been abused or do not feel safe at home. This information is not intended  to replace advice given to you by your health care provider. Make sure you discuss any questions you have with your health care provider. Document Released: 02/28/2011  Document Revised: 01/21/2016 Document Reviewed: 05/19/2015 Elsevier Interactive Patient Education  [employer].

## 2018-06-04 ENCOUNTER — Encounter (INDEPENDENT_AMBULATORY_CARE_PROVIDER_SITE_OTHER): Payer: Medicare Other | Admitting: Ophthalmology

## 2018-06-12 ENCOUNTER — Encounter: Payer: Self-pay | Admitting: Gynecology

## 2018-06-12 DIAGNOSIS — Z1231 Encounter for screening mammogram for malignant neoplasm of breast: Secondary | ICD-10-CM | POA: Diagnosis not present

## 2018-06-18 NOTE — Progress Notes (Signed)
[facility] Note  06/19/2018     CHIEF COMPLAINT Patient presents for Retina Evaluation   HISTORY OF PRESENT ILLNESS: Sharon Paul is a 72 y.o. female who presents to the clinic today for:   HPI    Retina Evaluation    In both eyes.  Associated Symptoms Floaters and Flashes.  Negative for Blind Spot, Photophobia, Scalp Tenderness, Fever, Pain, Glare, Jaw Claudication, Weight Loss, Distortion, Redness, Trauma, Shoulder/Hip pain and Fatigue.  Context:  distance vision, mid-range vision and near vision.  Treatments tried include no treatments.  I, the attending physician,  performed the HPI with the patient and updated documentation appropriately.          Comments    Referral of Dr. Katy Fitch for retina eval. Patient states since February she has been experiencing flashing lights when she goes from dark to light Pt has occasional floaters ou. Pt reports she had red eye OS she saw Dr. Katy Fitch who told her she had bleeding and t she needed to be referred  to retina specialist. Patient use seasonal allergy gtt's PRN.          Last edited by Bernarda Caffey, MD on 06/19/2018  3:11 PM. (History)      Referring physician: Clent Jacks, MD [address] [place], [place] [address]  HISTORICAL INFORMATION:   Selected notes from the MEDICAL RECORD NUMBER Referred by Dr. Wyatt Portela for concern of increased floaters / bleeding and tear OS LEE: (S. Groat) [BCVA: OD: OS:] Ocular Hx- PMH-CVD, HLD    CURRENT MEDICATIONS: No current outpatient medications on file. (Ophthalmic Drugs)   No current facility-administered medications for this visit.  (Ophthalmic Drugs)   Current Outpatient Medications (Other)  Medication Sig  . aspirin 81 MG tablet Take 81 mg by mouth daily.  . Cholecalciferol 2000 UNITS TABS Take 1 tablet by mouth daily.  [name] FLUoxetine (PROZAC) 10 MG capsule TAKE 1 CAPSULE BY MOUTH  DAILY  . fluticasone (FLONASE) 50 MCG/ACT nasal spray  PLACE 2 SPRAYS INTO THE NOSE DAILY.  [name] ibuprofen (ADVIL,MOTRIN) 800 MG tablet Take 1 tablet (800 mg total) by mouth every 8 (eight) hours as needed.  [name] ipratropium (ATROVENT) 0.03 % nasal spray Place 2 sprays into both nostrils 2 (two) times daily. Do not use for more than 5days.  . irbesartan (AVAPRO) 300 MG tablet Take 0.5 tablets (150 mg total) by mouth 2 (two) times daily.  [name] labetalol (NORMODYNE) 300 MG tablet Take 1 tablet (300 mg total) by mouth 2 (two) times daily.  [name] LORazepam (ATIVAN) 0.5 MG tablet TAKE 1 TABLET BY MOUTH TWICE A DAY  . NONFORMULARY OR COMPOUNDED ITEM Estradiol 6.44% prefilled applicators Insert one appful vaginally twice weekly.  . pantoprazole (PROTONIX) 40 MG tablet Take 1 tablet (40 mg total) by mouth daily.  . rosuvastatin (CRESTOR) 10 MG tablet TAKE 1 TABLET BY MOUTH  DAILY  . valACYclovir (VALTREX) 500 MG tablet Take twice daily for 3-5 days   No current facility-administered medications for this visit.  (Other)      REVIEW OF SYSTEMS: ROS    Positive for: Eyes   Negative for: Constitutional, Gastrointestinal, Neurological, Skin, Genitourinary, Musculoskeletal, HENT, Endocrine, Cardiovascular, Respiratory, Psychiatric, Allergic/Imm, Heme/Lymph   Last edited by Zenovia Jordan, LPN on 03/47/4259  5:63 PM. (History)    pt states she saw Dr. Wyatt Portela back in Feb/March bc she was having flashes and floaters, pt states he told her to come back  in 6 months and she saw him again last month, pt states she sees flashes at night and she avoids driving at night because she sees so many flashes, she states it does not happen every day, pt states she only sees a few floaters occasionally and they don't really bother her, pt states flashes are always in the same spot in her eye, pt endorses having HTN, but denies having diabetes  ALLERGIES Allergies  Allergen Reactions  . Amlodipine     HA  . Augmentin [Amoxicillin-Pot Clavulanate]     diarrhea  . Clarithromycin      REACTION: gi upset. Can take Zpac  . Codeine Nausea And Vomiting    Can take Prom-cod syr  . Esomeprazole Magnesium     REACTION: side pain  . Hydrochlorothiazide     REACTION: leg cramps, dehydration  . Iohexol      Desc: PT HAS SWELLING TO FACE,LIPS AND THROAT WITH CONTRAST DYE   . Kapidex [Dexlansoprazole]     rash  . Losartan Potassium     REACTION: HA  . Olmesartan Medoxomil     REACTION: weak legs  . Ramipril     Per pt: unknown reaction  . Risedronate Sodium     Per pt: unknown reaction    PAST MEDICAL HISTORY Past Medical History:  Diagnosis Date  . Celiac sprue 2007  . CVD (cardiovascular disease)   . Foot fracture, left 2011  . GERD (gastroesophageal reflux disease)   . Hyperlipidemia   . Osteopenia 03/2017   T score -1.8 FRAX 6%/1.2%. Stable from prior DEXA  . PVD (peripheral vascular disease) (HCC)    mild carotid ? fibromuscular dysplasia  . Urinary incontinence    Past Surgical History:  Procedure Laterality Date  . APPENDECTOMY    . BREAST SURGERY     right breast biopsy  . ESOPHAGOGASTRODUODENOSCOPY    . ROTATOR CUFF REPAIR     RIGHT  . stress cardiolite  04-18-00  . VAGINAL HYSTERECTOMY  1993   leiomyomata    FAMILY HISTORY Family History  Problem Relation Age of Onset  . Glaucoma Mother   . Heart disease Mother   . Hypertension Mother   . Heart disease Father   . Hypertension Father   . Stroke Father   . Hypertension Paternal Grandmother   . Lung cancer Paternal Grandmother   . Hypertension Sister   . Hypertension Brother   . Glaucoma Brother   . Colon cancer Neg Hx     SOCIAL HISTORY Social History   Tobacco Use  . Smoking status: Former Smoker    Last attempt to quit: 02/11/1970    Years since quitting: 48.3  . Smokeless tobacco: Never Used  Substance Use Topics  . Alcohol use: Yes    Alcohol/week: 0.0 standard drinks    Comment: very rare  . Drug use: No         OPHTHALMIC EXAM:  Base Eye Exam    Visual  Acuity (Snellen - Linear)      Right Left   Dist cc 20/30 -1 20/30 -1   Dist ph cc 20/30 +2 20/25 +2   Correction:  Glasses       Tonometry (Tonopen, 2:43 PM)      Right Left   Pressure 17 18       Pupils      Dark Light Shape React APD   Right 4 3 Round Brisk None   Left 4 3 Round Brisk None  Visual Fields (Counting fingers)      Left Right    Full Full       Extraocular Movement      Right Left    Full, Ortho Full, Ortho       Neuro/Psych    Oriented x3:  Yes       Dilation    Both eyes:  1.0% Mydriacyl, 2.5% Phenylephrine @ 2:43 PM        Slit Lamp and Fundus Exam    Slit Lamp Exam      Right Left   Lids/Lashes Dermatochalasis - upper lid Dermatochalasis - upper lid   Conjunctiva/Sclera White and quiet White and quiet   Cornea 2+ Punctate epithelial erosions 2+ Punctate epithelial erosions   Anterior Chamber moderate depth, narrow temporal angle, No cell or flare moderate depth, narrow temporal angle, No cell or flare   Iris Round and dilated Round and dilated   Lens 2+ Nuclear sclerosis, 2+ Cortical cataract 2+ Nuclear sclerosis, 2+ Cortical cataract   Vitreous Vitreous syneresis Vitreous syneresis, Posterior vitreous detachment       Fundus Exam      Right Left   Disc Pink and Sharp Pink and Sharp   C/D Ratio 0.5 0.4   Macula Flat, Blunted foveal reflex, mild RPE mottling, No heme or edema Good foveal reflex, mild RPE mottling, trace Epiretinal membrane, No heme or edema   Vessels Vascular attenuation, +AV crossing changes Vascular attenuation   Periphery Attached, No heme, pigmented VR tuft at 1000 midzone -- no SRF Attached, No heme         Refraction    Wearing Rx      Sphere Cylinder Axis Add   Right +0.75 +0.75 174 +2.00   Left +1.50   +2.00   Age:  1year       Manifest Refraction (Auto)      Sphere Cylinder Axis Dist VA   Right +1.25 +0.75 161 20/25+1   Left +1.50 +0.25 004 20/20-3          IMAGING AND PROCEDURES  Imaging  and Procedures for @TODAY @  OCT, Retina - OU - Both Eyes       Right Eye Quality was good. Central Foveal Thickness: 253. Progression has no prior data. Findings include normal foveal contour, no SRF, no IRF, vitreomacular adhesion .   Left Eye Quality was good. Central Foveal Thickness: 254. Progression has no prior data. Findings include normal foveal contour, no SRF, no IRF.   Notes *Images captured and stored on drive  Diagnosis / Impression:  NFP/No SFR/IRF OU  Clinical management:  See below  Abbreviations: NFP - Normal foveal profile. CME - cystoid macular edema. PED - pigment epithelial detachment. IRF - intraretinal fluid. SRF - subretinal fluid. EZ - ellipsoid zone. ERM - epiretinal membrane. ORA - outer retinal atrophy. ORT - outer retinal tubulation. SRHM - subretinal hyper-reflective material                 ASSESSMENT/PLAN:    ICD-10-CM   1. Posterior vitreous detachment of left eye H43.812   2. Essential hypertension I10   3. Hypertensive retinopathy of both eyes H35.033   4. Retinal edema H35.81 OCT, Retina - OU - Both Eyes    1. PVD OS, mild vitreous syneresis OD  Mildly symptomatic PVD OS -- onset of intermittent flashes/floaters February 2019  Symptoms have gradually improved, but still some intermittent f/f  Discussed findings and prognosis  No RT or RD  on 360 scleral depressed exam  Reviewed s/s of RT/RD  Strict return precautions for any such RT/RD signs/symptoms  F/U 6-8 weeks  2,3. Hypertensive retinopathy OU - discussed importance of tight BP control - monitor  4. No retinal edema on exam or OCT  Ophthalmic Meds Ordered this visit:  No orders of the defined types were placed in this encounter.      Return for F/U PVD OS, DFE, OCT,  6-8 weeks.  There are no Patient Instructions on file for this visit.   Explained the diagnoses, plan, and follow up with the patient and they expressed understanding.  Patient expressed  understanding of the importance of proper follow up care.   This document serves as a record of services personally performed by Gardiner Sleeper, MD, PhD. It was created on their behalf by Ernest Mallick, OA, an ophthalmic assistant. The creation of this record is the provider's dictation and/or activities during the visit.    Electronically signed by: Ernest Mallick, OA  10.21.19 8:36 AM    Gardiner Sleeper, M.D., Ph.D. Diseases & Surgery of the Retina and Vitreous [facility]   I have reviewed the above documentation for accuracy and completeness, and I agree with the above. Gardiner Sleeper, M.D., Ph.D. 06/20/18 8:36 AM    Abbreviations: M myopia (nearsighted); A astigmatism; H hyperopia (farsighted); P presbyopia; Mrx spectacle prescription;  CTL contact lenses; OD right eye; OS left eye; OU both eyes  XT exotropia; ET esotropia; PEK punctate epithelial keratitis; PEE punctate epithelial erosions; DES dry eye syndrome; MGD meibomian gland dysfunction; ATs artificial tears; PFAT's preservative free artificial tears; [place] nuclear sclerotic cataract; PSC posterior subcapsular cataract; ERM epi-retinal membrane; PVD posterior vitreous detachment; RD retinal detachment; DM diabetes mellitus; DR diabetic retinopathy; NPDR non-proliferative diabetic retinopathy; PDR proliferative diabetic retinopathy; CSME clinically significant macular edema; DME diabetic macular edema; dbh dot blot hemorrhages; CWS cotton wool spot; POAG primary open angle glaucoma; C/D cup-to-disc ratio; HVF humphrey visual field; GVF goldmann visual field; OCT optical coherence tomography; IOP intraocular pressure; BRVO Branch retinal vein occlusion; CRVO central retinal vein occlusion; CRAO central retinal artery occlusion; BRAO branch retinal artery occlusion; RT retinal tear; SB scleral buckle; PPV pars plana vitrectomy; VH Vitreous hemorrhage; PRP panretinal laser photocoagulation; IVK intravitreal kenalog; VMT  vitreomacular traction; MH Macular hole;  NVD neovascularization of the disc; NVE neovascularization elsewhere; AREDS age related eye disease study; ARMD age related macular degeneration; POAG primary open angle glaucoma; EBMD epithelial/anterior basement membrane dystrophy; ACIOL anterior chamber intraocular lens; IOL intraocular lens; PCIOL posterior chamber intraocular lens; Phaco/IOL phacoemulsification with intraocular lens placement; [place] photorefractive keratectomy; LASIK laser assisted in situ keratomileusis; HTN hypertension; DM diabetes mellitus; COPD chronic obstructive pulmonary disease

## 2018-06-19 ENCOUNTER — Ambulatory Visit (INDEPENDENT_AMBULATORY_CARE_PROVIDER_SITE_OTHER): Payer: Medicare Other | Admitting: Ophthalmology

## 2018-06-19 ENCOUNTER — Encounter (INDEPENDENT_AMBULATORY_CARE_PROVIDER_SITE_OTHER): Payer: Self-pay | Admitting: Ophthalmology

## 2018-06-19 DIAGNOSIS — H43812 Vitreous degeneration, left eye: Secondary | ICD-10-CM

## 2018-06-19 DIAGNOSIS — I10 Essential (primary) hypertension: Secondary | ICD-10-CM | POA: Diagnosis not present

## 2018-06-19 DIAGNOSIS — H3581 Retinal edema: Secondary | ICD-10-CM | POA: Diagnosis not present

## 2018-06-19 DIAGNOSIS — H35033 Hypertensive retinopathy, bilateral: Secondary | ICD-10-CM | POA: Diagnosis not present

## 2018-06-20 ENCOUNTER — Encounter (INDEPENDENT_AMBULATORY_CARE_PROVIDER_SITE_OTHER): Payer: Self-pay | Admitting: Ophthalmology

## 2018-06-30 LAB — GLUCOSE, POCT (MANUAL RESULT ENTRY): POC GLUCOSE: 125 mg/dL — AB (ref 70–99)

## 2018-07-09 ENCOUNTER — Telehealth: Payer: Self-pay

## 2018-07-09 NOTE — Telephone Encounter (Signed)
Copied from [place] [id number]. Topic: General - Other >> Jul 09, 2018 11:38 AM Leward Quan A wrote: Reason for CRM: Patient called to ask if Emelia Loron RN could give her a call back when she gets a chance. Has questions she would like to ask. Patient did not eloberate Ph# [phone] or [phone]

## 2018-07-11 ENCOUNTER — Telehealth: Payer: Self-pay | Admitting: *Deleted

## 2018-07-11 NOTE — Telephone Encounter (Signed)
Called patient in response to a CRM she left for nurse. Patient stated that she has been experiencing abdominal pain and wants to discuss cholesterol medications with PCP. She called to make an appointment and was told that PCP would not be able to see her until December. When the patient stated she felt that would be too long to wait she was not given any other option to see another provider and was told she would need to wait. Patient added that she has not been told this in the past and she felt as though she had been dismissed.    Nurse apologized and attempted to schedule an appointment for patient. Nurse was unable to schedule with Dr. Alain Marion before December. At that time the patient requested to see Dr. Jenny Reichmann and an appointment was scheduled on 07/19/18 as the patient is leaving for [place] tomorrow and will not be back until then. Nurse explained she would inform manager regarding her treatment while attempting to schedule an appointment. The patient was thankful and appreciative.

## 2018-07-19 ENCOUNTER — Ambulatory Visit (INDEPENDENT_AMBULATORY_CARE_PROVIDER_SITE_OTHER): Payer: Medicare Other | Admitting: Internal Medicine

## 2018-07-19 ENCOUNTER — Encounter: Payer: Self-pay | Admitting: Internal Medicine

## 2018-07-19 VITALS — BP 116/68 | HR 63 | Temp 98.4°F | Ht <= 58 in | Wt 132.0 lb

## 2018-07-19 DIAGNOSIS — R1012 Left upper quadrant pain: Secondary | ICD-10-CM | POA: Insufficient documentation

## 2018-07-19 DIAGNOSIS — K219 Gastro-esophageal reflux disease without esophagitis: Secondary | ICD-10-CM

## 2018-07-19 DIAGNOSIS — I10 Essential (primary) hypertension: Secondary | ICD-10-CM | POA: Diagnosis not present

## 2018-07-19 DIAGNOSIS — R140 Abdominal distension (gaseous): Secondary | ICD-10-CM | POA: Diagnosis not present

## 2018-07-19 MED ORDER — MELOXICAM 7.5 MG PO TABS: 7.5000 mg | ORAL_TABLET | Freq: Two times a day (BID) | ORAL | 2 refills | Status: DC | PRN

## 2018-07-19 NOTE — Patient Instructions (Addendum)
OK to take the OTC pepcid at bedtime and also the pantoprazole in the AM  Please continue all other medications as before  Please have the pharmacy call with any other refills you may need.  Please continue your efforts at being more active, low cholesterol diet, and weight control  Please keep your appointments with your specialists as you may have planned  You will be contacted regarding the referral for: Gastroenterology/Dr Fuller Plan for possible endoscopy

## 2018-07-19 NOTE — Assessment & Plan Note (Signed)
Ok to take protonix 40 qd, and pepcid qhs

## 2018-07-19 NOTE — Progress Notes (Signed)
Subjective:    Patient ID: Sharon Paul, female    DOB: 29-May-1946, 72 y.o.   MRN: [id number]  HPI here with c/o several years onset intermittent upper/left abd pain with bloating and reflux, Denies worsening dysphagia, n/v, bowel change or blood.  Has known hx of GERD, IBS.  No recent EGD,  Pt denies fever, wt loss, night sweats, loss of appetite, or other constitutional symptoms  Pt denies chest pain, increased sob or doe, wheezing, orthopnea, PND, increased LE swelling, palpitations, dizziness or syncope.  Pt denies new neurological symptoms such as new headache, or facial or extremity weakness or numbness   Pt denies polydipsia, polyuria Does have persistent x 1 wk left lateral hip tender pain .   Past Medical History:  Diagnosis Date  . Celiac sprue 2007  . CVD (cardiovascular disease)   . Foot fracture, left 2011  . GERD (gastroesophageal reflux disease)   . Hyperlipidemia   . Osteopenia 03/2017   T score -1.8 FRAX 6%/1.2%. Stable from prior DEXA  . PVD (peripheral vascular disease) (HCC)    mild carotid ? fibromuscular dysplasia  . Urinary incontinence    Past Surgical History:  Procedure Laterality Date  . APPENDECTOMY    . BREAST SURGERY     right breast biopsy  . ESOPHAGOGASTRODUODENOSCOPY    . ROTATOR CUFF REPAIR     RIGHT  . stress cardiolite  04-18-00  . VAGINAL HYSTERECTOMY  1993   leiomyomata    reports that she quit smoking about 48 years ago. She has never used smokeless tobacco. She reports that she drinks alcohol. She reports that she does not use drugs. family history includes Glaucoma in her brother and mother; Heart disease in her father and mother; Hypertension in her brother, father, mother, paternal grandmother, and sister; Lung cancer in her paternal grandmother; Stroke in her father. Allergies  Allergen Reactions  . Amlodipine     HA  . Augmentin [Amoxicillin-Pot Clavulanate]     diarrhea  . Clarithromycin     REACTION: gi upset. Can take  Zpac  . Codeine Nausea And Vomiting    Can take Prom-cod syr  . Esomeprazole Magnesium     REACTION: side pain  . Hydrochlorothiazide     REACTION: leg cramps, dehydration  . Iohexol      Desc: PT HAS SWELLING TO FACE,LIPS AND THROAT WITH CONTRAST DYE   . Kapidex [Dexlansoprazole]     rash  . Losartan Potassium     REACTION: HA  . Olmesartan Medoxomil     REACTION: weak legs  . Ramipril     Per pt: unknown reaction  . Risedronate Sodium     Per pt: unknown reaction   Current Outpatient Medications on File Prior to Visit  Medication Sig Dispense Refill  . aspirin 81 MG tablet Take 81 mg by mouth daily.    . Cholecalciferol 2000 UNITS TABS Take 1 tablet by mouth daily.    [name] FLUoxetine (PROZAC) 10 MG capsule TAKE 1 CAPSULE BY MOUTH  DAILY 90 capsule 2  . fluticasone (FLONASE) 50 MCG/ACT nasal spray PLACE 2 SPRAYS INTO THE NOSE DAILY. 48 g 3  . ibuprofen (ADVIL,MOTRIN) 800 MG tablet Take 1 tablet (800 mg total) by mouth every 8 (eight) hours as needed. 60 tablet 1  . ipratropium (ATROVENT) 0.03 % nasal spray Place 2 sprays into both nostrils 2 (two) times daily. Do not use for more than 5days. 30 mL 0  . irbesartan (AVAPRO) 300 MG tablet  Take 0.5 tablets (150 mg total) by mouth 2 (two) times daily. 90 tablet 3  . labetalol (NORMODYNE) 300 MG tablet Take 1 tablet (300 mg total) by mouth 2 (two) times daily. 180 tablet 3  . LORazepam (ATIVAN) 0.5 MG tablet TAKE 1 TABLET BY MOUTH TWICE A DAY 60 tablet 3  . NONFORMULARY OR COMPOUNDED ITEM Estradiol 2.35% prefilled applicators Insert one appful vaginally twice weekly. 8 each 5  . pantoprazole (PROTONIX) 40 MG tablet Take 1 tablet (40 mg total) by mouth daily. 30 tablet 0  . rosuvastatin (CRESTOR) 10 MG tablet TAKE 1 TABLET BY MOUTH  DAILY 90 tablet 3  . valACYclovir (VALTREX) 500 MG tablet Take twice daily for 3-5 days 30 tablet 1   No current facility-administered medications on file prior to visit.    Review of Systems   Constitutional: Negative for other unusual diaphoresis or sweats HENT: Negative for ear discharge or swelling Eyes: Negative for other worsening visual disturbances Respiratory: Negative for stridor or other swelling  Gastrointestinal: Negative for worsening distension or other blood Genitourinary: Negative for retention or other urinary change Musculoskeletal: Negative for other MSK pain or swelling Skin: Negative for color change or other new lesions Neurological: Negative for worsening tremors and other numbness  Psychiatric/Behavioral: Negative for worsening agitation or other fatigue All other system neg per pt    Objective:   Physical Exam BP 116/68   Pulse 63   Temp 98.4 F (36.9 C) (Oral)   Ht 4\' 10"  (1.473 m)   Wt 132 lb (59.9 kg)   SpO2 96%   BMI 27.59 kg/m  VS noted,  Constitutional: Pt appears in NAD HENT: Head: NCAT.  Right Ear: External ear normal.  Left Ear: External ear normal.  Eyes: . Pupils are equal, round, and reactive to light. Conjunctivae and EOM are normal Nose: without d/c or deformity Neck: Neck supple. Gross normal ROM Cardiovascular: Normal rate and regular rhythm.   Pulmonary/Chest: Effort normal and breath sounds without rales or wheezing.  Abd:  Soft, NT, ND, + BS, no organomegaly Neurological: Pt is alert. At baseline orientation, motor grossly intact Skin: Skin is warm. No rashes, other new lesions, no LE edema Psychiatric: Pt behavior is normal without agitation  No other exam findings Lab Results  Component Value Date   WBC 4.0 02/21/2018   HGB 12.1 02/21/2018   HCT 35.7 (L) 02/21/2018   PLT 245.0 02/21/2018   GLUCOSE 91 02/21/2018   CHOL 214 (H) 02/14/2018   TRIG 147.0 02/14/2018   HDL 59.50 02/14/2018   LDLDIRECT 138.2 10/22/2010   LDLCALC 125 (H) 02/14/2018   ALT 18 02/14/2018   AST 18 02/14/2018   NA 137 02/21/2018   K 4.5 02/21/2018   CL 102 02/21/2018   CREATININE 0.96 02/21/2018   BUN 18 02/21/2018   CO2 28 02/21/2018    TSH 1.68 09/08/2016   HGBA1C 6.1 03/15/2017          Assessment & Plan:

## 2018-07-19 NOTE — Assessment & Plan Note (Signed)
Etiology unclear, will check labs, hold on imaging for now, for GI referral

## 2018-07-19 NOTE — Assessment & Plan Note (Signed)
stable overall by history and exam, recent data reviewed with pt, and pt to continue medical treatment as before,  to f/u any worsening symptoms or concerns  

## 2018-07-19 NOTE — Assessment & Plan Note (Signed)
?   funcitonal dyspepsa vs other - for GI referral for possible egd

## 2018-07-30 ENCOUNTER — Ambulatory Visit (INDEPENDENT_AMBULATORY_CARE_PROVIDER_SITE_OTHER): Payer: Medicare Other | Admitting: Family

## 2018-07-30 ENCOUNTER — Encounter: Payer: Self-pay | Admitting: Family

## 2018-07-30 VITALS — BP 126/78 | HR 94 | Temp 98.0°F | Ht <= 58 in | Wt 132.0 lb

## 2018-07-30 DIAGNOSIS — J209 Acute bronchitis, unspecified: Secondary | ICD-10-CM | POA: Diagnosis not present

## 2018-07-30 MED ORDER — AZITHROMYCIN 250 MG PO TABS: ORAL_TABLET | ORAL | 0 refills | Status: DC

## 2018-07-30 NOTE — Progress Notes (Signed)
Sharon Paul is a 72 y.o. female with the following history as recorded in [employer]:  Patient Active Problem List   Diagnosis Date Noted  . LUQ pain 07/19/2018  . Bloating 07/19/2018  . 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 Viral URI with cough 04/25/2017  . Urinary frequency 11/20/2016  . Lactose intolerance in adult 09/13/2016  . 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 Well adult exam 12/04/2013  . Back pain, thoracic 08/13/2012  . 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 Urinary incontinence   . Right shoulder pain 12/13/2011  . Sciatica of right side 07/06/2011  . Hyperglycemia 06/07/2011  . Abdominal pain 04/21/2011  . WEAKNESS 09/21/2010  . CAROTID OCCLUSIVE DISEASE 09/07/2010  . WRIST PAIN 06/25/2010  . DIZZINESS 07/28/2009  . TOBACCO USE, QUIT 07/28/2009  . Pain in joint 05/26/2009  . Lumbago 11/05/2008  . Irritable bowel syndrome 06/27/2008  . GASTRITIS, HX OF 06/27/2008  . Rash and other nonspecific skin eruption 06/02/2008  . Dyslipidemia 04/23/2008  . Generalized anxiety disorder 04/23/2008  . DIVERTICULOSIS, COLON 04/23/2008  . COLONIC POLYPS, HX OF 04/23/2008  . GERD 04/02/2008  . Allergic rhinitis 12/11/2007  . Vitamin D deficiency 08/15/2007  . Peripheral vascular disease ([place]) 08/15/2007  . Essential hypertension 03/24/2007    Current Outpatient Medications  Medication Sig Dispense Refill  . aspirin 81 MG tablet Take 81 mg by mouth daily.    . Cholecalciferol 2000 UNITS TABS Take 1 tablet by mouth daily.    [name] FLUoxetine (PROZAC) 10 MG capsule TAKE 1 CAPSULE BY MOUTH  DAILY 90 capsule 2  . fluticasone (FLONASE) 50 MCG/ACT nasal spray PLACE 2 SPRAYS INTO THE NOSE DAILY. 48 g 3  . ibuprofen (ADVIL,MOTRIN) 800 MG tablet Take 1 tablet (800 mg total) by mouth every 8 (eight) hours as needed. 60 tablet 1  . ipratropium (ATROVENT) 0.03 % nasal spray Place 2 sprays into both nostrils 2 (two) times daily. Do not use for more than 5days. 30 mL 0  . irbesartan (AVAPRO) 300 MG  tablet Take 0.5 tablets (150 mg total) by mouth 2 (two) times daily. 90 tablet 3  . labetalol (NORMODYNE) 300 MG tablet Take 1 tablet (300 mg total) by mouth 2 (two) times daily. 180 tablet 3  . LORazepam (ATIVAN) 0.5 MG tablet TAKE 1 TABLET BY MOUTH TWICE A DAY 60 tablet 3  . meloxicam (MOBIC) 7.5 MG tablet Take 1 tablet (7.5 mg total) by mouth 2 (two) times daily as needed for pain. 60 tablet 2  . NONFORMULARY OR COMPOUNDED ITEM Estradiol 3.66% prefilled applicators Insert one appful vaginally twice weekly. 8 each 5  . pantoprazole (PROTONIX) 40 MG tablet Take 1 tablet (40 mg total) by mouth daily. 30 tablet 0  . rosuvastatin (CRESTOR) 10 MG tablet TAKE 1 TABLET BY MOUTH  DAILY 90 tablet 3  . valACYclovir (VALTREX) 500 MG tablet Take twice daily for 3-5 days 30 tablet 1  . azithromycin (ZITHROMAX) 250 MG tablet 2 tabs po qd x 1 day; 1 tablet per day x 4 days; 6 tablet 0   No current facility-administered medications for this visit.     Allergies: Amlodipine; Augmentin [amoxicillin-pot clavulanate]; Clarithromycin; Codeine; Esomeprazole magnesium; Hydrochlorothiazide; Iohexol; Kapidex [dexlansoprazole]; Losartan potassium; Olmesartan medoxomil; Ramipril; and Risedronate sodium  Past Medical History:  Diagnosis Date  . Celiac sprue 2007  . CVD (cardiovascular disease)   . Foot fracture, left 2011  . GERD (gastroesophageal reflux disease)   . Hyperlipidemia   . Osteopenia 03/2017   T score -  1.8 FRAX 6%/1.2%. Stable from prior DEXA  . PVD (peripheral vascular disease) ([facility])    mild carotid ? fibromuscular dysplasia  . Urinary incontinence     Past Surgical History:  Procedure Laterality Date  . APPENDECTOMY    . BREAST SURGERY     right breast biopsy  . ESOPHAGOGASTRODUODENOSCOPY    . ROTATOR CUFF REPAIR     RIGHT  . stress cardiolite  04-18-00  . VAGINAL HYSTERECTOMY  1993   leiomyomata    Family History  Problem Relation Age of Onset  . Glaucoma Mother   . Heart disease  Mother   . Hypertension Mother   . Heart disease Father   . Hypertension Father   . Stroke Father   . Hypertension Paternal Grandmother   . Lung cancer Paternal Grandmother   . Hypertension Sister   . Hypertension Brother   . Glaucoma Brother   . Colon cancer Neg Hx     Social History   Tobacco Use  . Smoking status: Former Smoker    Last attempt to quit: 02/11/1970    Years since quitting: 48.4  . Smokeless tobacco: Never Used  Substance Use Topics  . Alcohol use: Yes    Alcohol/week: 0.0 standard drinks    Comment: very rare    Subjective:  Woke up last Friday with laryngitis/ sore throat; has progressively worsened- now complaining of productive cough/ green mucus; no fever, no chest pain; became concerned when her upper back started hurting in the past 24 hours; currently using OTC Mucinex and Robitussin DM to help treat symptoms.      Objective:  Vitals:   07/30/18 1045  BP: 126/78  Pulse: 94  Temp: 98 F (36.7 C)  TempSrc: Oral  SpO2: 96%  Weight: 132 lb (59.9 kg)  Height: 4\' 10"  (1.473 m)    General: Well developed, well nourished, in no acute distress  Skin : Warm and dry.  Head: Normocephalic and atraumatic  Eyes: Sclera and conjunctiva clear; pupils round and reactive to light; extraocular movements intact  Ears: External normal; canals clear; tympanic membranes normal  Oropharynx: Pink, supple. No suspicious lesions  Neck: Supple without thyromegaly, adenopathy  Lungs: Respirations unlabored; clear to auscultation bilaterally without wheeze, rales, rhonchi  CVS exam: normal rate and regular rhythm.  Neurologic: Alert and oriented; speech intact; face symmetrical; moves all extremities well; CNII-XII intact without focal deficit   Assessment:  1. Acute bronchitis, unspecified organism     Plan:  Rx for Z-pak #1 take as directed; increase fluids, rest and follow-up worse, no better.    No follow-ups on file.  No orders of the defined types were  placed in this encounter.   Requested Prescriptions   Signed Prescriptions Disp Refills  . azithromycin (ZITHROMAX) 250 MG tablet 6 tablet 0    Sig: 2 tabs po qd x 1 day; 1 tablet per day x 4 days;

## 2018-08-01 ENCOUNTER — Other Ambulatory Visit: Payer: Self-pay

## 2018-08-01 MED ORDER — NONFORMULARY OR COMPOUNDED ITEM: 2 refills | Status: DC

## 2018-08-09 DIAGNOSIS — M25551 Pain in right hip: Secondary | ICD-10-CM | POA: Diagnosis not present

## 2018-08-14 ENCOUNTER — Encounter (INDEPENDENT_AMBULATORY_CARE_PROVIDER_SITE_OTHER): Payer: Medicare Other | Admitting: Ophthalmology

## 2018-08-16 ENCOUNTER — Ambulatory Visit: Payer: Self-pay

## 2018-08-16 NOTE — Telephone Encounter (Signed)
Incoming call from Patient who request that another Rx of Z pak be called in related to the fact she is experiencing,  Sx all over again.  Patient states that the z pak worked, but she feels URI is starting all  Over.  Complaint of cough, congestion.  F/U appointment is 09/04/18. Patient reports that the cogh syrup did help. would like to have a refill on cough syrup and Z  Pak. Called to [facility]. Patient states that she has a history of bursitis in her hip.  Recently received                    Injection for pain. Bursitis.  Wanted  provider to be aware.  Patient request call regarding Rx.                             .     Reason for Disposition . [1] Taking antibiotic < 72 hours (3 days) AND [2] symptoms are SAME (not improved)  Additional Information . Negative: [1] Finished taking antibiotics AND [2] symptoms are BETTER but [3] not completely gone    Patient reports she has 4 more day left of antibiotic, works with children,  Who are constantly sneezing coughing etc.  Answer Assessment - Initial Assessment Questions 1. INFECTION: "What infection is the antibiotic being given for?"     Upper respiratory infection 2. ANTIBIOTIC: "What antibiotic are you taking" "How many times per day?"    ZPAK 3. DURATION: "When was the antibiotic started?"     5 day z- pack 4. MAIN CONCERN OR SYMPTOM:  "What is your main concern right now?"     Feeling like URI coming coming back... work with a lot of chilren  5. BETTER-SAME-WORSE: "Are you getting better, staying the same, or getting worse compared to when you first started the antibiotics?" If getting worse, ask: "In what way?"     Feel like the beginging of it all over again 6. FEVER: "Do you have a fever?" If so, ask: "What is your temperature, how was it measured, and when did it start?"     denies 7. SYMPTOMS: "Are there any other symptoms you're concerned about?" If so, ask: "When did it start?"      Cough, cogestion 8. FOLLOW-UP  APPOINTMENT: "Do you have a follow-up appointment with your doctor?"   09/04/18  Protocols used: INFECTION ON ANTIBIOTIC FOLLOW-UP CALL-A-AH

## 2018-08-16 NOTE — Telephone Encounter (Signed)
Pt saw Mickel Baas on 12/02, please advise

## 2018-08-17 ENCOUNTER — Other Ambulatory Visit: Payer: Self-pay | Admitting: Family

## 2018-08-17 MED ORDER — PROMETHAZINE-DM 6.25-15 MG/5ML PO SYRP: 5.0000 mL | ORAL_SOLUTION | Freq: Four times a day (QID) | ORAL | 0 refills | Status: DC | PRN

## 2018-08-17 MED ORDER — AZITHROMYCIN 250 MG PO TABS
ORAL_TABLET | ORAL | 0 refills | Status: DC
Start: 2018-08-17 — End: 2018-09-04

## 2018-08-17 NOTE — Addendum Note (Signed)
Addended by: Karren Cobble on: 08/17/2018 01:02 PM   Modules accepted: Orders

## 2018-08-17 NOTE — Telephone Encounter (Signed)
RX sent

## 2018-08-17 NOTE — Telephone Encounter (Signed)
Ok to renew Z pack OV if sick Thx

## 2018-08-20 ENCOUNTER — Ambulatory Visit: Payer: Self-pay

## 2018-08-20 MED ORDER — BENZONATATE 100 MG PO CAPS: 100.0000 mg | ORAL_CAPSULE | Freq: Three times a day (TID) | ORAL | 0 refills | Status: DC | PRN

## 2018-08-20 NOTE — Telephone Encounter (Signed)
The pt called stating she is having ongoing cough and chest congestion; she was given azithromycin on 08/17/18; the pt says that she hears her chest when she lays down (wheezing); she complains of chest tightness, and a non-productive cough; she has not taking the medication prescribed by Jodi Mourning because her pharmacy did not have it; [facility]l9 but they have not gotten a reply; [facility] P[facility] and she says that the medication is on backorder with no expected delivery date; notified the pt and she verbalized understanding;  she can be contacted at [phone];and detailed message can be left; the pt;[facility] choice [facility] [address]; spoke with Mickel Baas will also route to office for notification.

## 2018-08-20 NOTE — Telephone Encounter (Signed)
Call placed to patient. Left VM to return call to office 

## 2018-08-20 NOTE — Addendum Note (Signed)
Addended by: Sherlene Shams on: 08/20/2018 05:02 PM   Modules accepted: Orders

## 2018-08-20 NOTE — Telephone Encounter (Signed)
It looks like Dr. Florentina Addison the second Z-pak but if she is still having symptoms she needs to be seen. I saw her 3 weeks ago. I am going to send in [facility] for her as an alternative to the cough syrup.

## 2018-08-20 NOTE — Telephone Encounter (Signed)
[facility], she states that the medication is on backorder with no expected delivery date; electronic prescription requested.

## 2018-08-20 NOTE — Telephone Encounter (Signed)
  Reason for Disposition . Caller has URGENT medication question about med that PCP prescribed and triager unable to answer question  Answer Assessment - Initial Assessment Questions 1. SYMPTOMS: "Do you have any symptoms?"     Cough, chest congestion 2. SEVERITY: If symptoms are present, ask "Are they mild, moderate or severe?"     moderate  Protocols used: MEDICATION QUESTION CALL-A-AH

## 2018-08-21 ENCOUNTER — Other Ambulatory Visit: Payer: Self-pay | Admitting: *Deleted

## 2018-08-21 ENCOUNTER — Ambulatory Visit: Payer: Self-pay | Admitting: *Deleted

## 2018-08-21 MED ORDER — BENZONATATE 100 MG PO CAPS: 100.0000 mg | ORAL_CAPSULE | Freq: Three times a day (TID) | ORAL | 0 refills | Status: DC | PRN

## 2018-08-21 NOTE — Telephone Encounter (Signed)
Pt reports pharmacy did not have Z-Pack and Tessalon RXs.  TN spoke with pharmacist; states pt did pick up Z-Pack.  Tessalon resent. Spoke with patient who verifies she did pick up Z-Pack 08/17/18  and is on last day. States she thought she had to take more of the medication and was looking for the refill. Pt verbalizes understanding of antibiotic course.

## 2018-08-21 NOTE — Telephone Encounter (Signed)
Spoke with patient and info given 

## 2018-09-04 ENCOUNTER — Ambulatory Visit (INDEPENDENT_AMBULATORY_CARE_PROVIDER_SITE_OTHER): Payer: Medicare Other | Admitting: Internal Medicine

## 2018-09-04 ENCOUNTER — Encounter: Payer: Self-pay | Admitting: Internal Medicine

## 2018-09-04 VITALS — BP 130/84 | HR 63 | Temp 98.2°F | Ht <= 58 in | Wt 133.0 lb

## 2018-09-04 DIAGNOSIS — B029 Zoster without complications: Secondary | ICD-10-CM | POA: Diagnosis not present

## 2018-09-04 DIAGNOSIS — E785 Hyperlipidemia, unspecified: Secondary | ICD-10-CM

## 2018-09-04 DIAGNOSIS — E559 Vitamin D deficiency, unspecified: Secondary | ICD-10-CM | POA: Diagnosis not present

## 2018-09-04 DIAGNOSIS — I10 Essential (primary) hypertension: Secondary | ICD-10-CM | POA: Diagnosis not present

## 2018-09-04 DIAGNOSIS — I739 Peripheral vascular disease, unspecified: Secondary | ICD-10-CM | POA: Diagnosis not present

## 2018-09-04 DIAGNOSIS — M25552 Pain in left hip: Secondary | ICD-10-CM

## 2018-09-04 DIAGNOSIS — M25551 Pain in right hip: Secondary | ICD-10-CM

## 2018-09-04 DIAGNOSIS — M25559 Pain in unspecified hip: Secondary | ICD-10-CM | POA: Insufficient documentation

## 2018-09-04 MED ORDER — VALACYCLOVIR HCL 1 G PO TABS: 1000.0000 mg | ORAL_TABLET | Freq: Three times a day (TID) | ORAL | 0 refills | Status: DC

## 2018-09-04 MED ORDER — IRBESARTAN 300 MG PO TABS: 150.0000 mg | ORAL_TABLET | Freq: Two times a day (BID) | ORAL | 3 refills | Status: DC

## 2018-09-04 NOTE — Assessment & Plan Note (Signed)
Vit D 

## 2018-09-04 NOTE — Assessment & Plan Note (Signed)
F/u w/Ortho Hip opener exercises

## 2018-09-04 NOTE — Assessment & Plan Note (Signed)
cardiac CT scan for calcium scoring offered 

## 2018-09-04 NOTE — Assessment & Plan Note (Signed)
L chest - mitigated Valtrex po

## 2018-09-04 NOTE — Patient Instructions (Addendum)
CBD oil 5-10 mg twice a day Hip opener exercises  Cardiac CT calcium scoring test $150   Computed tomography, more commonly known as a CT or CAT scan, is a diagnostic medical imaging test. Like traditional x-rays, it produces multiple images or pictures of the inside of the body. The cross-sectional images generated during a CT scan can be reformatted in multiple planes. They can even generate three-dimensional images. These images can be viewed on a computer monitor, printed on film or by a 3D printer, or transferred to a CD or DVD. CT images of internal organs, bones, soft tissue and blood vessels provide greater detail than traditional x-rays, particularly of soft tissues and blood vessels. A cardiac CT scan for coronary calcium is a non-invasive way of obtaining information about the presence, location and extent of calcified plaque in the coronary arteries-the vessels that supply oxygen-containing blood to the heart muscle. Calcified plaque results when there is a build-up of fat and other substances under the inner layer of the artery. This material can calcify which signals the presence of atherosclerosis, a disease of the vessel wall, also called coronary artery disease (CAD). People with this disease have an increased risk for heart attacks. In addition, over time, progression of plaque build up (CAD) can narrow the arteries or even close off blood flow to the heart. The result may be chest pain, sometimes called "angina," or a heart attack. Because calcium is a marker of CAD, the amount of calcium detected on a cardiac CT scan is a helpful prognostic tool. The findings on cardiac CT are expressed as a calcium score. Another name for this test is coronary artery calcium scoring.  What are some common uses of the procedure? The goal of cardiac CT scan for calcium scoring is to determine if CAD is present and to what extent, even if there are no symptoms. It is a screening study that may be  recommended by a physician for patients with risk factors for CAD but no clinical symptoms. The major risk factors for CAD are: . high blood cholesterol levels  . family history of heart attacks  . diabetes  . high blood pressure  . cigarette smoking  . overweight or obese  . physical inactivity   A negative cardiac CT scan for calcium scoring shows no calcification within the coronary arteries. This suggests that CAD is absent or so minimal it cannot be seen by this technique. The chance of having a heart attack over the next two to five years is very low under these circumstances. A positive test means that CAD is present, regardless of whether or not the patient is experiencing any symptoms. The amount of calcification-expressed as the calcium score-may help to predict the likelihood of a myocardial infarction (heart attack) in the coming years and helps your medical doctor or cardiologist decide whether the patient may need to take preventive medicine or undertake other measures such as diet and exercise to lower the risk for heart attack. The extent of CAD is graded according to your calcium score:  Calcium Score  Presence of CAD  0 No evidence of CAD   1-10 Minimal evidence of CAD  11-100 Mild evidence of CAD  101-400 Moderate evidence of CAD  Over 400 Extensive evidence of CAD

## 2018-09-04 NOTE — Progress Notes (Signed)
Subjective:  Patient ID: Sharon Paul, female    DOB: Jan 08, 1946  Age: 73 y.o. MRN: [id number]  CC: No chief complaint on file.   HPI Sharon Paul presents for HTN,  C/o L burning CP 1.5 wks. Pt had a URI 2 wks ago C/o R hip pain - seeing Dr Rosamaria Lints - had an injection  Outpatient Medications Prior to Visit  Medication Sig Dispense Refill  . aspirin 81 MG tablet Take 81 mg by mouth daily.    . Cholecalciferol 2000 UNITS TABS Take 1 tablet by mouth daily.    [name] FLUoxetine (PROZAC) 10 MG capsule TAKE 1 CAPSULE BY MOUTH  DAILY 90 capsule 2  . fluticasone (FLONASE) 50 MCG/ACT nasal spray PLACE 2 SPRAYS INTO THE NOSE DAILY. 48 g 3  . ibuprofen (ADVIL,MOTRIN) 800 MG tablet Take 1 tablet (800 mg total) by mouth every 8 (eight) hours as needed. 60 tablet 1  . ipratropium (ATROVENT) 0.03 % nasal spray Place 2 sprays into both nostrils 2 (two) times daily. Do not use for more than 5days. 30 mL 0  . irbesartan (AVAPRO) 300 MG tablet Take 0.5 tablets (150 mg total) by mouth 2 (two) times daily. 90 tablet 3  . labetalol (NORMODYNE) 300 MG tablet Take 1 tablet (300 mg total) by mouth 2 (two) times daily. 180 tablet 3  . LORazepam (ATIVAN) 0.5 MG tablet TAKE 1 TABLET BY MOUTH TWICE A DAY 60 tablet 3  . meloxicam (MOBIC) 7.5 MG tablet Take 1 tablet (7.5 mg total) by mouth 2 (two) times daily as needed for pain. 60 tablet 2  . NONFORMULARY OR COMPOUNDED ITEM Estradiol 3.70% prefilled applicators Insert one appful vaginally twice weekly. 8 each 2  . pantoprazole (PROTONIX) 40 MG tablet Take 1 tablet (40 mg total) by mouth daily. 30 tablet 0  . rosuvastatin (CRESTOR) 10 MG tablet TAKE 1 TABLET BY MOUTH  DAILY 90 tablet 3  . valACYclovir (VALTREX) 500 MG tablet Take twice daily for 3-5 days 30 tablet 1  . azithromycin (ZITHROMAX) 250 MG tablet 2 tabs po qd x 1 day; 1 tablet per day x 4 days; OV if not better 6 tablet 0  . benzonatate (TESSALON) 100 MG capsule Take 1 capsule (100 mg  total) by mouth 3 (three) times daily as needed. 20 capsule 0   No facility-administered medications prior to visit.     ROS: Review of Systems  Constitutional: Negative.  Negative for activity change, appetite change, chills, diaphoresis, fatigue, fever and unexpected weight change.  HENT: Negative for congestion, ear pain, facial swelling, hearing loss, mouth sores, nosebleeds, postnasal drip, rhinorrhea, sinus pressure, sneezing, sore throat, tinnitus and trouble swallowing.   Eyes: Negative for pain, discharge, redness, itching and visual disturbance.  Respiratory: Negative for cough, chest tightness, shortness of breath, wheezing and stridor.   Cardiovascular: Negative for chest pain, palpitations and leg swelling.  Gastrointestinal: Negative for abdominal distention, anal bleeding, blood in stool, constipation, diarrhea, nausea and rectal pain.  Genitourinary: Negative for difficulty urinating, dysuria, flank pain, frequency, genital sores, hematuria, pelvic pain, urgency, vaginal bleeding and vaginal discharge.  Musculoskeletal: Negative for arthralgias, back pain, gait problem, joint swelling, neck pain and neck stiffness.  Skin: Negative.  Negative for rash.  Neurological: Negative for dizziness, tremors, seizures, syncope, speech difficulty, weakness, numbness and headaches.  Hematological: Negative for adenopathy. Does not bruise/bleed easily.  Psychiatric/Behavioral: Negative for behavioral problems, decreased concentration, dysphoric mood, sleep disturbance and suicidal ideas. The patient is not nervous/anxious.  Objective:  BP 130/84 (BP Location: Left Arm, Patient Position: Sitting, Cuff Size: Normal)   Pulse 63   Temp 98.2 F (36.8 C) (Oral)   Ht 4\' 10"  (1.473 m)   Wt 133 lb (60.3 kg)   SpO2 98%   BMI 27.80 kg/m   BP Readings from Last 3 Encounters:  09/04/18 130/84  07/30/18 126/78  07/19/18 116/68    Wt Readings from Last 3 Encounters:  09/04/18 133 lb  (60.3 kg)  07/30/18 132 lb (59.9 kg)  07/19/18 132 lb (59.9 kg)    Physical Exam Constitutional:      General: She is not in acute distress.    Appearance: She is well-developed.  HENT:     Head: Normocephalic.     Right Ear: External ear normal.     Left Ear: External ear normal.     Nose: Nose normal.  Eyes:     General:        Right eye: No discharge.        Left eye: No discharge.     Conjunctiva/sclera: Conjunctivae normal.     Pupils: Pupils are equal, round, and reactive to light.  Neck:     Musculoskeletal: Normal range of motion and neck supple.     Thyroid: No thyromegaly.     Vascular: No JVD.     Trachea: No tracheal deviation.  Cardiovascular:     Rate and Rhythm: Normal rate and regular rhythm.     Heart sounds: Normal heart sounds.  Pulmonary:     Effort: No respiratory distress.     Breath sounds: No stridor. No wheezing.  Abdominal:     General: Bowel sounds are normal. There is no distension.     Palpations: Abdomen is soft. There is no mass.     Tenderness: There is no abdominal tenderness. There is no guarding or rebound.  Musculoskeletal:        General: No tenderness.  Lymphadenopathy:     Cervical: No cervical adenopathy.  Skin:    Findings: No erythema or rash.  Neurological:     Cranial Nerves: No cranial nerve deficit.     Motor: No abnormal muscle tone.     Coordination: Coordination normal.     Deep Tendon Reflexes: Reflexes normal.  Psychiatric:        Behavior: Behavior normal.        Thought Content: Thought content normal.        Judgment: Judgment normal.   R hip w/pain ?zoster rash 2 elements on L chest  Lab Results  Component Value Date   WBC 4.0 02/21/2018   HGB 12.1 02/21/2018   HCT 35.7 (L) 02/21/2018   PLT 245.0 02/21/2018   GLUCOSE 91 02/21/2018   CHOL 214 (H) 02/14/2018   TRIG 147.0 02/14/2018   HDL 59.50 02/14/2018   LDLDIRECT 138.2 10/22/2010   LDLCALC 125 (H) 02/14/2018   ALT 18 02/14/2018   AST 18  02/14/2018   NA 137 02/21/2018   K 4.5 02/21/2018   CL 102 02/21/2018   CREATININE 0.96 02/21/2018   BUN 18 02/21/2018   CO2 28 02/21/2018   TSH 1.68 09/08/2016   HGBA1C 6.1 03/15/2017    No results found.  Assessment & Plan:   There are no diagnoses linked to this encounter.   No orders of the defined types were placed in this encounter.    Follow-up: No follow-ups on file.  Walker Kehr, MD

## 2018-09-04 NOTE — Assessment & Plan Note (Addendum)
1/19 cardiac CT scan for calcium scoring

## 2018-09-09 NOTE — Progress Notes (Signed)
[facility] Note  09/11/2018     CHIEF COMPLAINT Patient presents for Retina Follow Up   HISTORY OF PRESENT ILLNESS: Sharon Paul is a 73 y.o. female who presents to the clinic today for:   HPI    Retina Follow Up    Patient presents with  PVD.  In left eye.  Severity is moderate.  Duration of 2.5 months.  Since onset it is stable.  I, the attending physician,  performed the HPI with the patient and updated documentation appropriately.          Comments    Patient states vision the same OU. No new floaters or flashes.       Last edited by Bernarda Caffey, MD on 09/11/2018  2:15 PM. (History)      Referring physician: Cassandria Anger, MD [place], [place] [address]  HISTORICAL INFORMATION:   Selected notes from the MEDICAL RECORD NUMBER Referred by Dr. Wyatt Portela for concern of increased floaters / bleeding and tear OS LEE: (S. Groat) [BCVA: OD: OS:] Ocular Hx- PMH-CVD, HLD    CURRENT MEDICATIONS: No current outpatient medications on file. (Ophthalmic Drugs)   No current facility-administered medications for this visit.  (Ophthalmic Drugs)   Current Outpatient Medications (Other)  Medication Sig  . aspirin 81 MG tablet Take 81 mg by mouth daily.  . Cholecalciferol 2000 UNITS TABS Take 1 tablet by mouth daily.  [name] FLUoxetine (PROZAC) 10 MG capsule TAKE 1 CAPSULE BY MOUTH  DAILY  . fluticasone (FLONASE) 50 MCG/ACT nasal spray PLACE 2 SPRAYS INTO THE NOSE DAILY.  [name] ibuprofen (ADVIL,MOTRIN) 800 MG tablet Take 1 tablet (800 mg total) by mouth every 8 (eight) hours as needed.  [name] ipratropium (ATROVENT) 0.03 % nasal spray Place 2 sprays into both nostrils 2 (two) times daily. Do not use for more than 5days.  . irbesartan (AVAPRO) 300 MG tablet Take 0.5 tablets (150 mg total) by mouth 2 (two) times daily.  [name] labetalol (NORMODYNE) 300 MG tablet Take 1 tablet (300 mg total) by mouth 2 (two) times daily.  [name] LORazepam (ATIVAN)  0.5 MG tablet TAKE 1 TABLET BY MOUTH TWICE A DAY  . meloxicam (MOBIC) 7.5 MG tablet Take 1 tablet (7.5 mg total) by mouth 2 (two) times daily as needed for pain.  . NONFORMULARY OR COMPOUNDED ITEM Estradiol 0.30% prefilled applicators Insert one appful vaginally twice weekly.  . pantoprazole (PROTONIX) 40 MG tablet Take 1 tablet (40 mg total) by mouth daily.  . rosuvastatin (CRESTOR) 10 MG tablet TAKE 1 TABLET BY MOUTH  DAILY  . valACYclovir (VALTREX) 1000 MG tablet Take 1 tablet (1,000 mg total) by mouth 3 (three) times daily.  . valACYclovir (VALTREX) 500 MG tablet Take twice daily for 3-5 days   No current facility-administered medications for this visit.  (Other)      REVIEW OF SYSTEMS: ROS    Positive for: Eyes   Negative for: Constitutional, Gastrointestinal, Neurological, Skin, Genitourinary, Musculoskeletal, HENT, Endocrine, Cardiovascular, Respiratory, Psychiatric, Allergic/Imm, Heme/Lymph   Last edited by Roselee Nova D on 09/11/2018  2:01 PM. (History)      ALLERGIES Allergies  Allergen Reactions  . Amlodipine     HA  . Augmentin [Amoxicillin-Pot Clavulanate]     diarrhea  . Clarithromycin     REACTION: gi upset. Can take Zpac  . Codeine Nausea And Vomiting    Can take Prom-cod syr  . Esomeprazole Magnesium     REACTION:  side pain  . Hydrochlorothiazide     REACTION: leg cramps, dehydration  . Iohexol      Desc: PT HAS SWELLING TO FACE,LIPS AND THROAT WITH CONTRAST DYE   . Kapidex [Dexlansoprazole]     rash  . Losartan Potassium     REACTION: HA  . Olmesartan Medoxomil     REACTION: weak legs  . Ramipril     Per pt: unknown reaction  . Risedronate Sodium     Per pt: unknown reaction    PAST MEDICAL HISTORY Past Medical History:  Diagnosis Date  . Celiac sprue 2007  . CVD (cardiovascular disease)   . Foot fracture, left 2011  . GERD (gastroesophageal reflux disease)   . Hyperlipidemia   . Osteopenia 03/2017   T score -1.8 FRAX 6%/1.2%. Stable  from prior DEXA  . PVD (peripheral vascular disease) (HCC)    mild carotid ? fibromuscular dysplasia  . Urinary incontinence    Past Surgical History:  Procedure Laterality Date  . APPENDECTOMY    . BREAST SURGERY     right breast biopsy  . ESOPHAGOGASTRODUODENOSCOPY    . ROTATOR CUFF REPAIR     RIGHT  . stress cardiolite  04-18-00  . VAGINAL HYSTERECTOMY  1993   leiomyomata    FAMILY HISTORY Family History  Problem Relation Age of Onset  . Glaucoma Mother   . Heart disease Mother   . Hypertension Mother   . Heart disease Father   . Hypertension Father   . Stroke Father   . Hypertension Paternal Grandmother   . Lung cancer Paternal Grandmother   . Hypertension Sister   . Hypertension Brother   . Glaucoma Brother   . Colon cancer Neg Hx     SOCIAL HISTORY Social History   Tobacco Use  . Smoking status: Former Smoker    Last attempt to quit: 02/11/1970    Years since quitting: 48.6  . Smokeless tobacco: Never Used  Substance Use Topics  . Alcohol use: Yes    Alcohol/week: 0.0 standard drinks    Comment: very rare  . Drug use: No         OPHTHALMIC EXAM:  Base Eye Exam    Visual Acuity (Snellen - Linear)      Right Left   Dist cc 20/20 20/30   Dist ph cc  20/25 -1   Correction:  Glasses       Tonometry (Tonopen, 2:07 PM)      Right Left   Pressure 19 17       Pupils      Dark Light Shape React APD   Right 4 3 Round Brisk None   Left 4 3 Round Brisk None       Visual Fields (Counting fingers)      Left Right    Full Full       Extraocular Movement      Right Left    Full, Ortho Full, Ortho       Neuro/Psych    Oriented x3:  Yes   Mood/Affect:  Normal       Dilation    Both eyes:  1.0% Mydriacyl, 2.5% Phenylephrine @ 2:07 PM        Slit Lamp and Fundus Exam    Slit Lamp Exam      Right Left   Lids/Lashes Dermatochalasis - upper lid Dermatochalasis - upper lid   Conjunctiva/Sclera White and quiet White and quiet   Cornea 2+  Punctate epithelial  erosions 2+ Punctate epithelial erosions   Anterior Chamber moderate depth, narrow temporal angle, No cell or flare moderate depth, narrow temporal angle, No cell or flare   Iris Round and dilated Round and dilated   Lens 2+ Nuclear sclerosis, 2+ Cortical cataract 2+ Nuclear sclerosis, 2+ Cortical cataract   Vitreous Vitreous syneresis Vitreous syneresis, Posterior vitreous detachment       Fundus Exam      Right Left   Disc Pink and Sharp Pink and Sharp   C/D Ratio 0.5 0.4   Macula Flat, Blunted foveal reflex, mild RPE mottling, No heme or edema Good foveal reflex, mild RPE mottling, trace Epiretinal membrane, No heme or edema   Vessels Vascular attenuation, +AV crossing changes Vascular attenuation   Periphery Attached, No heme, pigmented CR scar at 1000 midzone -- no SRF; no RT/RD Attached, No heme; no RT/RD        Refraction    Wearing Rx      Sphere Cylinder Axis Add   Right +0.75 +0.75 174 +2.00   Left +1.50   +2.00          IMAGING AND PROCEDURES  Imaging and Procedures for @TODAY @  OCT, Retina - OU - Both Eyes       Right Eye Quality was good. Central Foveal Thickness: 252. Progression has been stable. Findings include normal foveal contour, no SRF, no IRF, vitreomacular adhesion  (Trace ERM).   Left Eye Quality was good. Central Foveal Thickness: 251. Progression has been stable. Findings include normal foveal contour, no SRF, no IRF, epiretinal membrane.   Notes *Images captured and stored on drive  Diagnosis / Impression:  NFP/No SFR/IRF OU -- no interval change from prior  Clinical management:  See below  Abbreviations: NFP - Normal foveal profile. CME - cystoid macular edema. PED - pigment epithelial detachment. IRF - intraretinal fluid. SRF - subretinal fluid. EZ - ellipsoid zone. ERM - epiretinal membrane. ORA - outer retinal atrophy. ORT - outer retinal tubulation. SRHM - subretinal hyper-reflective material                  ASSESSMENT/PLAN:    ICD-10-CM   1. Posterior vitreous detachment of left eye H43.812   2. Essential hypertension I10   3. Hypertensive retinopathy of both eyes H35.033   4. Retinal edema H35.81 OCT, Retina - OU - Both Eyes    1. PVD OS, mild vitreous syneresis OD  Mildly symptomatic PVD OS -- onset of intermittent flashes/floaters February 2019  Symptoms have gradually improved, but still some intermittent f/f  Discussed findings and prognosis  No RT or RD on 360 scleral depressed exam  Reviewed s/s of RT/RD  Strict return precautions for any such RT/RD signs/symptoms  F/u here prn -- recommend return to Dr. Katy Fitch for general ophthalmology care  2,3. Hypertensive retinopathy OU - discussed importance of tight BP control - monitor  4. No retinal edema on exam or OCT  Ophthalmic Meds Ordered this visit:  No orders of the defined types were placed in this encounter.      Return if symptoms worsen or fail to improve.  There are no Patient Instructions on file for this visit.   Explained the diagnoses, plan, and follow up with the patient and they expressed understanding.  Patient expressed understanding of the importance of proper follow up care.   This document serves as a record of services personally performed by Gardiner Sleeper, MD, PhD. It was created on their behalf  by Ernest Mallick, OA, an ophthalmic assistant. The creation of this record is the provider's dictation and/or activities during the visit.    Electronically signed by: Ernest Mallick, OA  01.12.2020 8:17 AM     Gardiner Sleeper, M.D., Ph.D. Diseases & Surgery of the Retina and Vitreous [facility]  I have reviewed the above documentation for accuracy and completeness, and I agree with the above. Gardiner Sleeper, M.D., Ph.D. 09/13/18 8:18 AM     Abbreviations: M myopia (nearsighted); A astigmatism; H hyperopia (farsighted); P presbyopia; Mrx spectacle prescription;  CTL contact  lenses; OD right eye; OS left eye; OU both eyes  XT exotropia; ET esotropia; PEK punctate epithelial keratitis; PEE punctate epithelial erosions; DES dry eye syndrome; MGD meibomian gland dysfunction; ATs artificial tears; PFAT's preservative free artificial tears; [place] nuclear sclerotic cataract; PSC posterior subcapsular cataract; ERM epi-retinal membrane; PVD posterior vitreous detachment; RD retinal detachment; DM diabetes mellitus; DR diabetic retinopathy; NPDR non-proliferative diabetic retinopathy; PDR proliferative diabetic retinopathy; CSME clinically significant macular edema; DME diabetic macular edema; dbh dot blot hemorrhages; CWS cotton wool spot; POAG primary open angle glaucoma; C/D cup-to-disc ratio; HVF humphrey visual field; GVF goldmann visual field; OCT optical coherence tomography; IOP intraocular pressure; BRVO Branch retinal vein occlusion; CRVO central retinal vein occlusion; CRAO central retinal artery occlusion; BRAO branch retinal artery occlusion; RT retinal tear; SB scleral buckle; PPV pars plana vitrectomy; VH Vitreous hemorrhage; PRP panretinal laser photocoagulation; IVK intravitreal kenalog; VMT vitreomacular traction; MH Macular hole;  NVD neovascularization of the disc; NVE neovascularization elsewhere; AREDS age related eye disease study; ARMD age related macular degeneration; POAG primary open angle glaucoma; EBMD epithelial/anterior basement membrane dystrophy; ACIOL anterior chamber intraocular lens; IOL intraocular lens; PCIOL posterior chamber intraocular lens; Phaco/IOL phacoemulsification with intraocular lens placement; [place] photorefractive keratectomy; LASIK laser assisted in situ keratomileusis; HTN hypertension; DM diabetes mellitus; COPD chronic obstructive pulmonary disease

## 2018-09-11 ENCOUNTER — Ambulatory Visit (INDEPENDENT_AMBULATORY_CARE_PROVIDER_SITE_OTHER): Payer: Medicare Other | Admitting: Ophthalmology

## 2018-09-11 ENCOUNTER — Encounter (INDEPENDENT_AMBULATORY_CARE_PROVIDER_SITE_OTHER): Payer: Self-pay | Admitting: Ophthalmology

## 2018-09-11 DIAGNOSIS — H3581 Retinal edema: Secondary | ICD-10-CM | POA: Diagnosis not present

## 2018-09-11 DIAGNOSIS — H43812 Vitreous degeneration, left eye: Secondary | ICD-10-CM

## 2018-09-11 DIAGNOSIS — H35033 Hypertensive retinopathy, bilateral: Secondary | ICD-10-CM

## 2018-09-11 DIAGNOSIS — I10 Essential (primary) hypertension: Secondary | ICD-10-CM | POA: Diagnosis not present

## 2018-09-13 ENCOUNTER — Encounter (INDEPENDENT_AMBULATORY_CARE_PROVIDER_SITE_OTHER): Payer: Self-pay | Admitting: Ophthalmology

## 2018-09-13 DIAGNOSIS — M7061 Trochanteric bursitis, right hip: Secondary | ICD-10-CM | POA: Diagnosis not present

## 2018-09-13 DIAGNOSIS — M25551 Pain in right hip: Secondary | ICD-10-CM | POA: Diagnosis not present

## 2018-09-17 ENCOUNTER — Other Ambulatory Visit: Payer: Self-pay | Admitting: Internal Medicine

## 2018-09-19 ENCOUNTER — Encounter: Payer: Self-pay | Admitting: Nurse Practitioner

## 2018-09-19 ENCOUNTER — Ambulatory Visit (INDEPENDENT_AMBULATORY_CARE_PROVIDER_SITE_OTHER): Payer: Medicare Other | Admitting: Nurse Practitioner

## 2018-09-19 VITALS — BP 142/80 | HR 59 | Temp 97.7°F | Ht <= 58 in | Wt 130.0 lb

## 2018-09-19 DIAGNOSIS — R109 Unspecified abdominal pain: Secondary | ICD-10-CM

## 2018-09-19 DIAGNOSIS — R197 Diarrhea, unspecified: Secondary | ICD-10-CM

## 2018-09-19 MED ORDER — METRONIDAZOLE 500 MG PO TABS: 500.0000 mg | ORAL_TABLET | Freq: Three times a day (TID) | ORAL | 0 refills | Status: DC

## 2018-09-19 MED ORDER — CIPROFLOXACIN HCL 500 MG PO TABS: 500.0000 mg | ORAL_TABLET | Freq: Two times a day (BID) | ORAL | 0 refills | Status: DC

## 2018-09-19 NOTE — Progress Notes (Signed)
Sharon Paul is a 73 y.o. female with the following history as recorded in Epic[employer]:  Patient Active Problem List   Diagnosis Date Noted  . Shingles outbreak 09/04/2018  . Hip pain 09/04/2018  . LUQ pain 07/19/2018  . Bloating 07/19/2018  . 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 Viral URI with cough 04/25/2017  . Urinary frequency 11/20/2016  . Lactose intolerance in adult 09/13/2016  . 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 Well adult exam 12/04/2013  . Back pain, thoracic 08/13/2012  . 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 Urinary incontinence   . Right shoulder pain 12/13/2011  . Sciatica of right side 07/06/2011  . Hyperglycemia 06/07/2011  . Abdominal pain 04/21/2011  . WEAKNESS 09/21/2010  . CAROTID OCCLUSIVE DISEASE 09/07/2010  . WRIST PAIN 06/25/2010  . DIZZINESS 07/28/2009  . TOBACCO USE, QUIT 07/28/2009  . Pain in joint 05/26/2009  . Lumbago 11/05/2008  . Irritable bowel syndrome 06/27/2008  . GASTRITIS, HX OF 06/27/2008  . Rash and other nonspecific skin eruption 06/02/2008  . Dyslipidemia 04/23/2008  . Generalized anxiety disorder 04/23/2008  . DIVERTICULOSIS, COLON 04/23/2008  . COLONIC POLYPS, HX OF 04/23/2008  . GERD 04/02/2008  . Allergic rhinitis 12/11/2007  . Vitamin D deficiency 08/15/2007  . Peripheral vascular disease ([place]) 08/15/2007  . Essential hypertension 03/24/2007    Current Outpatient Medications  Medication Sig Dispense Refill  . aspirin 81 MG tablet Take 81 mg by mouth daily.    . Cholecalciferol 2000 UNITS TABS Take 1 tablet by mouth daily.    [name] FLUoxetine (PROZAC) 10 MG capsule TAKE 1 CAPSULE BY MOUTH  DAILY 90 capsule 3  . fluticasone (FLONASE) 50 MCG/ACT nasal spray PLACE 2 SPRAYS INTO THE NOSE DAILY. 48 g 3  . irbesartan (AVAPRO) 300 MG tablet Take 0.5 tablets (150 mg total) by mouth 2 (two) times daily. 90 tablet 3  . labetalol (NORMODYNE) 300 MG tablet TAKE 1 TABLET BY MOUTH TWO  TIMES DAILY 180 tablet 3  . LORazepam (ATIVAN) 0.5 MG tablet  TAKE 1 TABLET BY MOUTH TWICE A DAY (Patient taking differently: as needed. ) 60 tablet 3  . meloxicam (MOBIC) 7.5 MG tablet Take 1 tablet (7.5 mg total) by mouth 2 (two) times daily as needed for pain. (Patient taking differently: Take 7.5 mg by mouth as needed for pain. ) 60 tablet 2  . NONFORMULARY OR COMPOUNDED ITEM Estradiol 5.78% prefilled applicators Insert one appful vaginally twice weekly. (Patient taking differently: Estradiol 4.69% prefilled applicators Insert one appful vaginally once weekly.) 8 each 2  . pantoprazole (PROTONIX) 40 MG tablet Take 1 tablet (40 mg total) by mouth daily. 30 tablet 0  . rosuvastatin (CRESTOR) 10 MG tablet TAKE 1 TABLET BY MOUTH  DAILY 90 tablet 3  . valACYclovir (VALTREX) 500 MG tablet Take twice daily for 3-5 days 30 tablet 1  . ciprofloxacin (CIPRO) 500 MG tablet Take 1 tablet (500 mg total) by mouth 2 (two) times daily. 10 tablet 0  . diphenoxylate-atropine (LOMOTIL) 2.5-0.025 MG tablet Take 1-2 tabs every six hours as needed. 30 tablet 1  . ibuprofen (ADVIL,MOTRIN) 800 MG tablet Take 1 tablet (800 mg total) by mouth every 8 (eight) hours as needed. 60 tablet 1  . metroNIDAZOLE (FLAGYL) 500 MG tablet Take 1 tablet (500 mg total) by mouth 3 (three) times daily. (Patient not taking: Reported on 09/21/2018) 21 tablet 0   No current facility-administered medications for this visit.     Allergies: Amlodipine; Augmentin [amoxicillin-pot clavulanate]; Clarithromycin; Codeine; Esomeprazole magnesium;  Hydrochlorothiazide; Iohexol; Kapidex [dexlansoprazole]; Losartan potassium; Olmesartan medoxomil; Ramipril; and Risedronate sodium  Past Medical History:  Diagnosis Date  . Celiac sprue 2007  . CVD (cardiovascular disease)   . Foot fracture, left 2011  . GERD (gastroesophageal reflux disease)   . Hyperlipidemia   . Osteopenia 03/2017   T score -1.8 FRAX 6%/1.2%. Stable from prior DEXA  . PVD (peripheral vascular disease) (HCC)    mild carotid ? fibromuscular  dysplasia  . Shingles   . Urinary incontinence     Past Surgical History:  Procedure Laterality Date  . APPENDECTOMY    . BREAST SURGERY     right breast biopsy  . ESOPHAGOGASTRODUODENOSCOPY    . ROTATOR CUFF REPAIR     RIGHT  . stress cardiolite  04-18-00  . VAGINAL HYSTERECTOMY  1993   leiomyomata    Family History  Problem Relation Age of Onset  . Glaucoma Mother   . Heart disease Mother   . Hypertension Mother   . Heart disease Father   . Hypertension Father   . Stroke Father   . Hypertension Paternal Grandmother   . Lung cancer Paternal Grandmother   . Hypertension Sister   . Hypertension Brother   . Glaucoma Brother   . Colon cancer Neg Hx     Social History   Tobacco Use  . Smoking status: Former Smoker    Last attempt to quit: 02/11/1970    Years since quitting: 48.6  . Smokeless tobacco: Never Used  Substance Use Topics  . Alcohol use: Yes    Alcohol/week: 0.0 standard drinks    Comment: very rare     Subjective:  She is here today requesting evaluation of acute complaint of diarrhea which began about 4 days ago after eating chicken dish at [employer]. She reports nausea, abdominal bloating, frequent loose stools with foul smell- about 3 stools per day , persistent over past 4 days. She started drinking kefir today which seemed to help her symptoms some. Denies fevers, chills, weakness, syncope, confusion, cp, sob, vomiting, rectal bleeding. She tried pepto bismol with no relief   ROS- See HPI  Objective:  Vitals:   09/19/18 1621 09/19/18 1707  BP: (!) 160/82 (!) 142/80  Pulse: (!) 59   Temp: 97.7 F (36.5 C)   TempSrc: Oral   SpO2: 98%   Weight: 130 lb (59 kg)   Height: 4\' 10"  (1.473 m)     General: Well developed, well nourished, in no acute distress  Skin : Warm and dry.  Head: Normocephalic and atraumatic  Eyes: Sclera and conjunctiva clear; pupils round and reactive to light; extraocular movements intact  Oropharynx: Pink,  supple. No suspicious lesions  Neck: Supple Lungs: Respirations unlabored; clear to auscultation bilaterally without wheeze, rales, rhonchi  CVS exam: normal rate and regular rhythm, S1 and S2 normal.  Abdomen: Soft; nondistended; normoactive bowel sounds; no masses or hepatosplenomegaly; mild generalized tenderness, no rigidity or guarding Extremities: No edema, cyanosis, clubbing  Vessels: Symmetric bilaterally  Neurologic: Alert and oriented; speech intact; face symmetrical; moves all extremities well; CNII-XII intact without focal deficit  Psychiatric: Normal mood and affect.   Assessment:  1. Diarrhea, unspecified type   2. Abdominal pain, unspecified abdominal location     Plan:   ?viral/food borne illness Will treat with antibiotic course-flagyl and cipro rx sent-medication dosing, side effects discussed Home management, red flags and return precautions including when to seek immediate care discussed and printed on AVS She will f/u for  new, worsening symptoms or if symptoms persist after antibiotic course  No follow-ups on file.  No orders of the defined types were placed in this encounter.   Requested Prescriptions   Signed Prescriptions Disp Refills  . metroNIDAZOLE (FLAGYL) 500 MG tablet 21 tablet 0    Sig: Take 1 tablet (500 mg total) by mouth 3 (three) times daily.    Patient not taking: Reported on 09/21/2018  . ciprofloxacin (CIPRO) 500 MG tablet 10 tablet 0    Sig: Take 1 tablet (500 mg total) by mouth 2 (two) times daily.

## 2018-09-19 NOTE — Patient Instructions (Signed)
Take cipro and flagyl as instructed  Please follow up for fevers over 101, if your symptoms get worse, or if your symptoms dont improve with antibiotics   Diarrhea, Adult Diarrhea is when you pass loose and watery poop (stool) often. Diarrhea can make you feel weak and cause you to lose water in your body (get dehydrated). Losing water in your body can cause you to:  Feel tired and thirsty.  Have a dry mouth.  Go pee (urinate) less often. Diarrhea often lasts 2-3 days. However, it can last longer if it is a sign of something more serious. It is important to treat your diarrhea as told by your doctor. Follow these instructions at home: Eating and drinking     Follow these instructions as told by your doctor:  Take an ORS (oral rehydration solution). This is a drink that helps you replace fluids and minerals your body lost. It is sold at pharmacies and stores.  Drink plenty of fluids, such as: ? Water. ? Ice chips. ? Diluted fruit juice. ? Low-calorie sports drinks. ? Milk, if you want.  Avoid drinking fluids that have a lot of sugar or caffeine in them.  Eat bland, easy-to-digest foods in small amounts as you are able. These foods include: ? Bananas. ? Applesauce. ? Rice. ? Low-fat (lean) meats. ? Toast. ? Crackers.  Avoid alcohol.  Avoid spicy or fatty foods.  Medicines  Take over-the-counter and prescription medicines only as told by your doctor.  If you were prescribed an antibiotic medicine, take it as told by your doctor. Do not stop using the antibiotic even if you start to feel better. General instructions   Wash your hands often using soap and water. If soap and water are not available, use a hand sanitizer. Others in your home should wash their hands as well. Hands should be washed: ? After using the toilet or changing a diaper. ? Before preparing, cooking, or serving food. ? While caring for a sick person. ? While visiting someone in a  hospital.  Drink enough fluid to keep your pee (urine) pale yellow.  Rest at home while you get better.  Watch your condition for any changes.  Take a warm bath to help with any burning or pain from having diarrhea.  Keep all follow-up visits as told by your doctor. This is important. Contact a doctor if:  You have a fever.  Your diarrhea gets worse.  You have new symptoms.  You cannot keep fluids down.  You feel light-headed or dizzy.  You have a headache.  You have muscle cramps. Get help right away if:  You have chest pain.  You feel very weak or you pass out (faint).  You have bloody or black poop or poop that looks like tar.  You have very bad pain, cramping, or bloating in your belly (abdomen).  You have trouble breathing or you are breathing very quickly.  Your heart is beating very quickly.  Your skin feels cold and clammy.  You feel confused.  You have signs of losing too much water in your body, such as: ? Dark pee, very little pee, or no pee. ? Cracked lips. ? Dry mouth. ? Sunken eyes. ? Sleepiness. ? Weakness. Summary  Diarrhea is when you pass loose and watery poop (stool) often.  Diarrhea can make you feel weak and cause you to lose water in your body (get dehydrated).  Take an ORS (oral rehydration solution). This is a drink that is  sold at pharmacies and stores.  Eat bland, easy-to-digest foods in small amounts as you are able.  Contact a doctor if your condition gets worse. Get help right away if you have signs that you have lost too much water in your body. This information is not intended to replace advice given to you by your health care provider. Make sure you discuss any questions you have with your health care provider. Document Released: 02/01/2008 Document Revised: 01/19/2018 Document Reviewed: 01/19/2018 Elsevier Interactive Patient Education  2019 [employer].

## 2018-09-21 ENCOUNTER — Encounter: Payer: Self-pay | Admitting: Physician Assistant

## 2018-09-21 ENCOUNTER — Other Ambulatory Visit: Payer: Medicare Other

## 2018-09-21 ENCOUNTER — Ambulatory Visit: Payer: Medicare Other | Admitting: Physician Assistant

## 2018-09-21 ENCOUNTER — Telehealth: Payer: Self-pay | Admitting: Gastroenterology

## 2018-09-21 ENCOUNTER — Ambulatory Visit: Payer: Self-pay

## 2018-09-21 VITALS — BP 110/62 | HR 60 | Ht <= 58 in | Wt 129.0 lb

## 2018-09-21 DIAGNOSIS — R197 Diarrhea, unspecified: Secondary | ICD-10-CM

## 2018-09-21 DIAGNOSIS — R109 Unspecified abdominal pain: Secondary | ICD-10-CM

## 2018-09-21 DIAGNOSIS — R1084 Generalized abdominal pain: Secondary | ICD-10-CM

## 2018-09-21 DIAGNOSIS — A09 Infectious gastroenteritis and colitis, unspecified: Secondary | ICD-10-CM

## 2018-09-21 LAB — C.DIFFICILE TOXIN: C. Difficile Toxin A: NOT DETECTED

## 2018-09-21 MED ORDER — DIPHENOXYLATE-ATROPINE 2.5-0.025 MG PO TABS: ORAL_TABLET | ORAL | 1 refills | Status: DC

## 2018-09-21 NOTE — Progress Notes (Signed)
Chief Complaint: Diarrhea  HPI:    Sharon Paul is a 73 year old female, known to Dr. Havery Moros, who presents to clinic today as an urgent add-on appointment for diarrhea.    03/06/2013 colonoscopy Dr. Maurene Capes was normal.  Repeat recommended in 10 years.    05/17/2016 last office visit for IBS with ongoing abdominal pain bloating and periodic loose stools.  At that time discussed low FODMAP and patient had done well with Bentyl 10-20 mg every 8 hours as needed for some spasms.  She had longstanding reflux well controlled with a PPI.    09/21/2018 patient called our office and discussed experiencing diarrhea which is been getting worse.  Patient saw her PCP 09/19/2018 and discussed diarrhea and was given Cipro and Flagyl.  She was called in Lomotil by her PCP today after complaining about diarrhea continuing..    Today, the patient tells me that on Friday night she ate [place] food, 09/14/2018 and felt okay, on Saturday she started some CBD Gummies under direction of her PCP and on Sunday morning awoke and just felt "not quite right".  Tells me that she had some generalized abdominal discomfort and nausea and later that day started with diarrhea.  She had taken another CBD gummy that morning.  She describes a generalized abdominal pain and bloating as well as "rumbling", telling me that the pain is much better now and it is more so the bloating bothering her with loose stools.  Tells me that anytime she eats she will have a watery stool.  She saw her PCP 2 days ago who prescribed Flagyl and Cipro.  Patient tried to take Flagyl but this was "way too strong for me and made me feel like I was going to vomit".  Has continued the Cipro 500 mg twice daily since then and tells me that her abdominal pain is gone but she continues with bloating and rumbling with multiple loose/watery stools up to 6-7 times/day which also woke her up through the night last night.  Did try taking Imodium on 2 occasions with no  help.    Denies fever, chills, blood in her stool, further nausea or vomiting.  Past Medical History:  Diagnosis Date  . Celiac sprue 2007  . CVD (cardiovascular disease)   . Foot fracture, left 2011  . GERD (gastroesophageal reflux disease)   . Hyperlipidemia   . Osteopenia 03/2017   T score -1.8 FRAX 6%/1.2%. Stable from prior DEXA  . PVD (peripheral vascular disease) (HCC)    mild carotid ? fibromuscular dysplasia  . Urinary incontinence     Past Surgical History:  Procedure Laterality Date  . APPENDECTOMY    . BREAST SURGERY     right breast biopsy  . ESOPHAGOGASTRODUODENOSCOPY    . ROTATOR CUFF REPAIR     RIGHT  . stress cardiolite  04-18-00  . VAGINAL HYSTERECTOMY  1993   leiomyomata    Current Outpatient Medications  Medication Sig Dispense Refill  . aspirin 81 MG tablet Take 81 mg by mouth daily.    . Cholecalciferol 2000 UNITS TABS Take 1 tablet by mouth daily.    . ciprofloxacin (CIPRO) 500 MG tablet Take 1 tablet (500 mg total) by mouth 2 (two) times daily. 10 tablet 0  . FLUoxetine (PROZAC) 10 MG capsule TAKE 1 CAPSULE BY MOUTH  DAILY 90 capsule 3  . fluticasone (FLONASE) 50 MCG/ACT nasal spray PLACE 2 SPRAYS INTO THE NOSE DAILY. 48 g 3  . ibuprofen (ADVIL,MOTRIN) 800 MG tablet  Take 1 tablet (800 mg total) by mouth every 8 (eight) hours as needed. (Patient not taking: Reported on 09/19/2018) 60 tablet 1  . ipratropium (ATROVENT) 0.03 % nasal spray Place 2 sprays into both nostrils 2 (two) times daily. Do not use for more than 5days. 30 mL 0  . irbesartan (AVAPRO) 300 MG tablet Take 0.5 tablets (150 mg total) by mouth 2 (two) times daily. 90 tablet 3  . labetalol (NORMODYNE) 300 MG tablet TAKE 1 TABLET BY MOUTH TWO  TIMES DAILY 180 tablet 3  . LORazepam (ATIVAN) 0.5 MG tablet TAKE 1 TABLET BY MOUTH TWICE A DAY 60 tablet 3  . meloxicam (MOBIC) 7.5 MG tablet Take 1 tablet (7.5 mg total) by mouth 2 (two) times daily as needed for pain. 60 tablet 2  . metroNIDAZOLE  (FLAGYL) 500 MG tablet Take 1 tablet (500 mg total) by mouth 3 (three) times daily. 21 tablet 0  . NONFORMULARY OR COMPOUNDED ITEM Estradiol 0.45% prefilled applicators Insert one appful vaginally twice weekly. 8 each 2  . pantoprazole (PROTONIX) 40 MG tablet Take 1 tablet (40 mg total) by mouth daily. 30 tablet 0  . rosuvastatin (CRESTOR) 10 MG tablet TAKE 1 TABLET BY MOUTH  DAILY 90 tablet 3  . valACYclovir (VALTREX) 1000 MG tablet Take 1 tablet (1,000 mg total) by mouth 3 (three) times daily. 21 tablet 0  . valACYclovir (VALTREX) 500 MG tablet Take twice daily for 3-5 days 30 tablet 1   No current facility-administered medications for this visit.     Allergies as of 09/21/2018 - Review Complete 09/21/2018  Allergen Reaction Noted  . Amlodipine  05/13/2013  . Augmentin [amoxicillin-pot clavulanate]  09/16/2016  . Clarithromycin  04/23/2008  . Codeine Nausea And Vomiting   . Esomeprazole magnesium  05/26/2009  . Hydrochlorothiazide  09/21/2010  . Iohexol  12/11/2003  . Kapidex [dexlansoprazole]  02/03/2011  . Losartan potassium  04/07/2010  . Olmesartan medoxomil  09/21/2010  . Ramipril    . Risedronate sodium      Family History  Problem Relation Age of Onset  . Glaucoma Mother   . Heart disease Mother   . Hypertension Mother   . Heart disease Father   . Hypertension Father   . Stroke Father   . Hypertension Paternal Grandmother   . Lung cancer Paternal Grandmother   . Hypertension Sister   . Hypertension Brother   . Glaucoma Brother   . Colon cancer Neg Hx     Social History   Socioeconomic History  . Marital status: Married    Spouse name: Theotis Burrow  . Number of children: Not on file  . Years of education: Not on file  . Highest education level: Not on file  Occupational History    Employer: [employer]  . Financial resource strain: Not hard at all  . Food insecurity:    Worry: Never true    Inability: Never true  . Transportation  needs:    Medical: No    Non-medical: No  Tobacco Use  . Smoking status: Former Smoker    Last attempt to quit: 02/11/1970    Years since quitting: 48.6  . Smokeless tobacco: Never Used  Substance and Sexual Activity  . Alcohol use: Yes    Alcohol/week: 0.0 standard drinks    Comment: very rare  . Drug use: No  . Sexual activity: Yes    Birth control/protection: Surgical, Post-menopausal, None    Comment: 1st intercourse 73 yo-Fewer  than 5 partners,des neg  Lifestyle  . Physical activity:    Days per week: 4 days    Minutes per session: 50 min  . Stress: Not at all  Relationships  . Social connections:    Talks on phone: More than three times a week    Gets together: More than three times a week    Attends religious service: More than 4 times per year    Active member of club or organization: Yes    Attends meetings of clubs or organizations: More than 4 times per year    Relationship status: Married  . Intimate partner violence:    Fear of current or ex partner: No    Emotionally abused: No    Physically abused: No    Forced sexual activity: No  Other Topics Concern  . Not on file  Social History Narrative  . Not on file    Review of Systems:    Constitutional: No weight loss, fever or chills Skin: No rash  Cardiovascular: No chest pain   Respiratory: No SOB  Gastrointestinal: See HPI and otherwise negative Genitourinary: No dysuria or change in urinary frequency Neurological: No headache, dizziness or syncope Musculoskeletal: No new muscle or joint pain Hematologic: No bleeding or bruising Psychiatric: No history of depression or anxiety   Physical Exam:  Vital signs: BP 110/62   Pulse 60   Ht 4\' 10"  (1.473 m)   Wt 129 lb (58.5 kg)   BMI 26.96 kg/m   Constitutional:   Pleasant female appears to be in NAD, Well developed, Well nourished, alert and cooperative Head:  Normocephalic and atraumatic. Eyes:   PEERL, EOMI. No icterus. Conjunctiva pink. Ears:   Normal auditory acuity. Neck:  Supple Throat: Oral cavity and pharynx without inflammation, swelling or lesion.  Respiratory: Respirations even and unlabored. Lungs clear to auscultation bilaterally.   No wheezes, crackles, or rhonchi.  Cardiovascular: Normal S1, S2. No MRG. Regular rate and rhythm. No peripheral edema, cyanosis or pallor.  Gastrointestinal:  Soft, nondistended, nontender. No rebound or guarding. Normal bowel sounds. No appreciable masses or hepatomegaly. Rectal:  Not performed.  Msk:  Symmetrical without gross deformities. Without edema, no deformity or joint abnormality.  Neurologic:  Alert and  oriented x4;  grossly normal neurologically.  Skin:   Dry and intact without significant lesions or rashes. Psychiatric: Demonstrates good judgement and reason without abnormal affect or behaviors.  No recent labs or imaging.  Assessment: 1.  Diarrhea: Acutely started on 09/17/2018 with generalized abdominal pain and bloating, pain is better after 2 days of Cipro 500 mg twice daily but diarrhea continues, curiously did start CBD Gummies on 09/16/2018, has since discontinued; consider viral versus infectious versus other cause 2.  Generalized abdominal pain: Better now after Cipro for 2 days  Plan: 1.  Told patient it is okay to discontinue Flagyl.  She should continue Cipro 500 mg twice daily for another 4 days which was her prescribed course from PCP. 2.  Encouraged patient to maintain a bland diet, discussed the BRAT diet 3.  Order stool studies to include a GI pathogen panel and C. difficile as well as O&P 4.  Prescribed Lomotil 1-2 tabs every 6 hours as needed for diarrhea #30 with 1 refill 5.  Discussed with patient if she is no better by midweek next week she should call and let us know 6.  Patient to follow in clinic with me or Dr. Havery Moros as needed in the future and/or as  directed by labs above.  Ellouise Newer, PA-C [facility] 09/21/2018, 1:57 PM  Cc:  Plotnikov, Evie Lacks, MD

## 2018-09-21 NOTE — Patient Instructions (Signed)
If you are age 72 or older, your body mass index should be between 23-30. Your Body mass index is 26.96 kg/m. If this is out of the aforementioned range listed, please consider follow up with your Primary Care Provider.  If you are age 72 or younger, your body mass index should be between 19-25. Your Body mass index is 26.96 kg/m. If this is out of the aformentioned range listed, please consider follow up with your Primary Care Provider.   Your provider has requested that you go to the basement level for lab work before leaving today. Press "B" on the elevator. The lab is located at the first door on the left as you exit the elevator.  We have sent the following medications to your pharmacy for you to pick up at your convenience: Lomotil  Follow up as needed.  Thank you for choosing me and [facility].    Ellouise Newer, PA-C

## 2018-09-21 NOTE — Telephone Encounter (Signed)
Patient notified to be here today at 1:45

## 2018-09-21 NOTE — Telephone Encounter (Signed)
Patient saw you on the 22nd and wanting lomotil sent in for her constant diarrhea still taking ABX. Please advise thank you

## 2018-09-21 NOTE — Telephone Encounter (Signed)
Pt has been experiencing diarrhea for a while and states that it has gotten worse, she would like to be seen today. pls call her.

## 2018-09-21 NOTE — Telephone Encounter (Signed)
Patient has been having diarrhea multiple episodes.  She is offered an appt today, but she declines.  She states that her PCP sent in some lomotil today.  She wants to try this and will call back if she changes her mind.

## 2018-09-21 NOTE — Telephone Encounter (Signed)
Out going call to Patient who states she was up  All night with having diarrhea stools.  States stomach has been rumbling.  All night.  Sx started Sunday.  Saw Caesar Chestnut NP   Prescribed  Antibiotic.  Patient still taking it. Patient  States the stool is very watery.  Denies vomiting.  Patient work in day care.  Exposure was probably there.  Patient request a Rx  Be called to [place] for Lomotil.  Patient also states that her rectum feels raw and irritated.  Related to Patient that I will send   Telephone encounter to DR.  Plotnivkov and Caesar Chestnut NP.  Patient voiced  Understanding.  Reviewed care  Advice voiced understanding. Encouraged Patient to call back if Sx.  Worsen.  Patient didn't want to make appointment , just requested Rx of Lomotil be called in.     Reason for Disposition . [1] MILD diarrhea (e.g., 1-3 or more stools than normal in past 24 hours) without known cause AND [2] present >  7 days  Answer Assessment - Initial Assessment Questions 1. DIARRHEA SEVERITY: "How bad is the diarrhea?" "How many extra stools have you had in the past 24 hours than normal?"    - NO DIARRHEA (SCALE 0)   - MILD (SCALE 1-3): Few loose or mushy BMs; increase of 1-3 stools over normal daily number of stools; mild increase in ostomy output.   -  MODERATE (SCALE 4-7): Increase of 4-6 stools daily over normal; moderate increase in ostomy output. * SEVERE (SCALE 8-10; OR 'WORST POSSIBLE'): Increase of 7 or more stools daily over normal; moderate increase in ostomy output; incontinence.     *No Answer*severe2. ONSET: "When did the diarrhea begin?"      sunday 3. BM CONSISTENCY: "How loose or watery is the diarrhea?"      watery 4. VOMITING: "Are you also vomiting?" If so, ask: "How many times in the past 24 hours?"      denies 5. ABDOMINAL PAIN: "Are you having any abdominal pain?" If yes: "What does it feel like?" (e.g., crampy, dull, intermittent, constant)      Denies pain  rumbling  6. ABDOMINAL PAIN SEVERITY: If present, ask: "How bad is the pain?"  (e.g., Scale 1-10; mild, moderate, or severe)   - MILD (1-3): doesn't interfere with normal activities, abdomen soft and not tender to touch    - MODERATE (4-7): interferes with normal activities or awakens from sleep, tender to touch    - SEVERE (8-10): excruciating pain, doubled over, unable to do any normal activities       denies 7. ORAL INTAKE: If vomiting, "Have you been able to drink liquids?" "How much fluids have you had in the past 24 hours?"     Unable to answer , in the process of purchasing gatorade.  8. HYDRATION: "Any signs of dehydration?" (e.g., dry mouth [not just dry lips], too weak to stand, dizziness, new weight loss) "When did you last urinate?"     Denies  9. EXPOSURE: "Have you traveled to a foreign country recently?" "Have you been exposed to anyone with diarrhea?" "Could you have eaten any food that was spoiled?"     Possibly.  Works with preschool.   10. ANTIBIOTIC USE: "Are you taking antibiotics now or have you taken antibiotics in the past 2 months?"       contnuing 11. OTHER SYMPTOMS: "Do you have any other symptoms?" (e.g., fever, blood in stool)  denies 12. PREGNANCY: "Is there any chance you are pregnant?" "When was your last menstrual period?"       na  Protocols used: [facility]

## 2018-09-21 NOTE — Telephone Encounter (Signed)
Pt called back and requested to be seen today.

## 2018-09-21 NOTE — Telephone Encounter (Signed)
It looks like she was seen in GI office for further evaluation of this complaint today.

## 2018-09-21 NOTE — Progress Notes (Signed)
Agree with assessment and plan as outlined although would await stool studies first to ensure c diff negative prior to starting lomotil, as that could make c diff worse

## 2018-09-24 ENCOUNTER — Other Ambulatory Visit: Payer: Medicare Other

## 2018-09-24 DIAGNOSIS — R197 Diarrhea, unspecified: Secondary | ICD-10-CM

## 2018-09-26 LAB — OVA AND PARASITE EXAMINATION
CONCENTRATE RESULT:: NONE SEEN
MICRO NUMBER:: 101453
SPECIMEN QUALITY: ADEQUATE
TRICHROME RESULT:: NONE SEEN

## 2018-09-27 ENCOUNTER — Telehealth: Payer: Self-pay | Admitting: Physician Assistant

## 2018-09-27 DIAGNOSIS — M25551 Pain in right hip: Secondary | ICD-10-CM | POA: Diagnosis not present

## 2018-09-27 NOTE — Telephone Encounter (Signed)
See lab results for details.  

## 2018-09-27 NOTE — Telephone Encounter (Signed)
Pt called to inquire about results.  Pt had OV 1.24.20 with Ellouise Newer.

## 2018-09-28 LAB — GASTROINTESTINAL PATHOGEN PANEL PCR
C. difficile Tox A/B, PCR: NOT DETECTED
Campylobacter, PCR: NOT DETECTED
Cryptosporidium, PCR: NOT DETECTED
E coli (ETEC) LT/ST PCR: NOT DETECTED
E coli (STEC) stx1/stx2, PCR: NOT DETECTED
E coli 0157, PCR: NOT DETECTED
Giardia lamblia, PCR: NOT DETECTED
Norovirus, PCR: NOT DETECTED
Rotavirus A, PCR: NOT DETECTED
SALMONELLA, PCR: NOT DETECTED
Shigella, PCR: NOT DETECTED

## 2018-10-03 DIAGNOSIS — M25551 Pain in right hip: Secondary | ICD-10-CM | POA: Diagnosis not present

## 2018-10-09 DIAGNOSIS — M25551 Pain in right hip: Secondary | ICD-10-CM | POA: Diagnosis not present

## 2018-10-11 DIAGNOSIS — M25551 Pain in right hip: Secondary | ICD-10-CM | POA: Diagnosis not present

## 2018-10-16 DIAGNOSIS — M25551 Pain in right hip: Secondary | ICD-10-CM | POA: Diagnosis not present

## 2018-10-23 ENCOUNTER — Other Ambulatory Visit: Payer: Self-pay | Admitting: Internal Medicine

## 2018-10-23 MED ORDER — IRBESARTAN 300 MG PO TABS: 150.0000 mg | ORAL_TABLET | Freq: Two times a day (BID) | ORAL | 0 refills | Status: DC

## 2018-10-23 NOTE — Telephone Encounter (Signed)
Copied from [place] [id number]. Topic: General - Other >> Oct 23, 2018  9:59 AM Lennox Solders wrote: Reason for CRM: pt is calling and waiting on avapro  med from [place]. Pt needs #30 day supply of avapro sent to [facility] [address] in [place].

## 2018-10-23 NOTE — Telephone Encounter (Signed)
Short term Rx to extend patient until she gets mail order sent to local pharmacy. Requested Prescriptions  Pending Prescriptions Disp Refills  . irbesartan (AVAPRO) 300 MG tablet 30 tablet 0    Sig: Take 0.5 tablets (150 mg total) by mouth 2 (two) times daily.     Cardiovascular:  Angiotensin Receptor Blockers Failed - 10/23/2018 10:20 AM      Failed - Cr in normal range and within 180 days    Creatinine, Ser  Date Value Ref Range Status  02/21/2018 0.96 0.40 - 1.20 mg/dL Final         Failed - K in normal range and within 180 days    Potassium  Date Value Ref Range Status  02/21/2018 4.5 3.5 - 5.1 mEq/L Final         Passed - Patient is not pregnant      Passed - Last BP in normal range    BP Readings from Last 1 Encounters:  09/21/18 110/62         Passed - Valid encounter within last 6 months    Recent Outpatient Visits          1 month ago Diarrhea, unspecified type   [place], Carter, NP   1 month ago Dyslipidemia   [place], MD   2 months ago Acute bronchitis, unspecified organism   [place], Marvis [facility], [place]   3 months ago Bloating   [place], Hunt Oris, MD   8 months ago Hand arthritis   [place], Evie Lacks, MD      Future Appointments            In 1 month Plotnikov, Evie Lacks, MD [place], [place]   In 7 months  [place], [facility]

## 2018-10-25 ENCOUNTER — Other Ambulatory Visit (INDEPENDENT_AMBULATORY_CARE_PROVIDER_SITE_OTHER): Payer: Medicare Other

## 2018-10-25 DIAGNOSIS — E785 Hyperlipidemia, unspecified: Secondary | ICD-10-CM | POA: Diagnosis not present

## 2018-10-25 DIAGNOSIS — I739 Peripheral vascular disease, unspecified: Secondary | ICD-10-CM

## 2018-10-25 LAB — BASIC METABOLIC PANEL
BUN: 12 mg/dL (ref 6–23)
CHLORIDE: 103 meq/L (ref 96–112)
CO2: 28 mEq/L (ref 19–32)
Calcium: 9.7 mg/dL (ref 8.4–10.5)
Creatinine, Ser: 0.82 mg/dL (ref 0.40–1.20)
GFR: 68.52 mL/min (ref >60.00)
Glucose, Bld: 97 mg/dL (ref 70–99)
POTASSIUM: 4.2 meq/L (ref 3.5–5.1)
Sodium: 139 mEq/L (ref 135–145)

## 2018-10-25 LAB — HEPATIC FUNCTION PANEL
ALT: 16 U/L (ref 0–35)
AST: 17 U/L (ref 0–37)
Albumin: 4.5 g/dL (ref 3.5–5.2)
Alkaline Phosphatase: 74 U/L (ref 39–117)
BILIRUBIN DIRECT: 0.1 mg/dL (ref 0.0–0.3)
Total Bilirubin: 0.5 mg/dL (ref 0.2–1.2)
Total Protein: 6.8 g/dL (ref 6.0–8.3)

## 2018-10-25 LAB — LIPID PANEL
Cholesterol: 176 mg/dL (ref 0–200)
HDL: 62.2 mg/dL (ref >39.00)
LDL CALC: 87 mg/dL (ref 0–99)
NonHDL: 113.85
Total CHOL/HDL Ratio: 3
Triglycerides: 132 mg/dL (ref 0.0–149.0)
VLDL: 26.4 mg/dL (ref 0.0–40.0)

## 2018-10-25 LAB — TSH: TSH: 1.69 u[IU]/mL (ref 0.35–4.50)

## 2018-11-05 ENCOUNTER — Telehealth: Payer: Self-pay | Admitting: Internal Medicine

## 2018-11-05 NOTE — Telephone Encounter (Unsigned)
Copied from [place] [id number]. Topic: General - Other >> Nov 05, 2018  4:38 PM Mcneil, Ja-Kwan wrote: Reason for CRM: Pt stated she is scheduled to travel to [place] on 11/24/18 and she would like Dr. Alain Marion or his nurse to call her to discuss whether or not she should cancel her trip. Pt stated she is fairly healthy but she is a little concerned about traveling since she is over the age of 49. Cb# [phone]

## 2018-11-06 NOTE — Telephone Encounter (Signed)
Pt notified we are not advising patients to cancel trips.

## 2018-11-12 ENCOUNTER — Ambulatory Visit (INDEPENDENT_AMBULATORY_CARE_PROVIDER_SITE_OTHER): Payer: Medicare Other | Admitting: Internal Medicine

## 2018-11-12 ENCOUNTER — Ambulatory Visit: Payer: Self-pay

## 2018-11-12 ENCOUNTER — Other Ambulatory Visit: Payer: Self-pay

## 2018-11-12 ENCOUNTER — Encounter: Payer: Self-pay | Admitting: Internal Medicine

## 2018-11-12 VITALS — BP 116/72 | HR 74 | Temp 98.1°F | Ht <= 58 in | Wt 130.0 lb

## 2018-11-12 DIAGNOSIS — R739 Hyperglycemia, unspecified: Secondary | ICD-10-CM

## 2018-11-12 DIAGNOSIS — I10 Essential (primary) hypertension: Secondary | ICD-10-CM | POA: Diagnosis not present

## 2018-11-12 DIAGNOSIS — J019 Acute sinusitis, unspecified: Secondary | ICD-10-CM | POA: Diagnosis not present

## 2018-11-12 MED ORDER — HYDROCODONE-HOMATROPINE 5-1.5 MG/5ML PO SYRP: 5.0000 mL | ORAL_SOLUTION | Freq: Four times a day (QID) | ORAL | 0 refills | Status: AC | PRN

## 2018-11-12 MED ORDER — IRBESARTAN 300 MG PO TABS: 150.0000 mg | ORAL_TABLET | Freq: Two times a day (BID) | ORAL | 3 refills | Status: DC

## 2018-11-12 MED ORDER — LEVOFLOXACIN 500 MG PO TABS: 500.0000 mg | ORAL_TABLET | Freq: Every day | ORAL | 0 refills | Status: AC

## 2018-11-12 NOTE — Progress Notes (Signed)
Subjective:    Patient ID: Sharon Paul, female    DOB: 12/24/1945, 73 y.o.   MRN: [id number]  HPI    Here with 2-3 days acute onset fever, facial pain, pressure, headache, general weakness and malaise, and greenish d/c, with mild ST and cough, but pt denies chest pain, wheezing, increased sob or doe, orthopnea, PND, increased LE swelling, palpitations, dizziness or syncope.  Pt denies new neurological symptoms such as new headache, or facial or extremity weakness or numbness   Pt denies polydipsia, polyuria Past Medical History:  Diagnosis Date  . Celiac sprue 2007  . CVD (cardiovascular disease)   . Foot fracture, left 2011  . GERD (gastroesophageal reflux disease)   . Hyperlipidemia   . Osteopenia 03/2017   T score -1.8 FRAX 6%/1.2%. Stable from prior DEXA  . PVD (peripheral vascular disease) (HCC)    mild carotid ? fibromuscular dysplasia  . Shingles   . Urinary incontinence    Past Surgical History:  Procedure Laterality Date  . APPENDECTOMY    . BREAST SURGERY     right breast biopsy  . ESOPHAGOGASTRODUODENOSCOPY    . ROTATOR CUFF REPAIR     RIGHT  . stress cardiolite  04-18-00  . VAGINAL HYSTERECTOMY  1993   leiomyomata    reports that she quit smoking about 48 years ago. She has never used smokeless tobacco. She reports current alcohol use. She reports that she does not use drugs. family history includes Glaucoma in her brother and mother; Heart disease in her father and mother; Hypertension in her brother, father, mother, paternal grandmother, and sister; Lung cancer in her paternal grandmother; Stroke in her father. Allergies  Allergen Reactions  . Amlodipine     HA  . Augmentin [Amoxicillin-Pot Clavulanate]     diarrhea  . Clarithromycin     REACTION: gi upset. Can take Zpac  . Codeine Nausea And Vomiting    Can take Prom-cod syr  . Esomeprazole Magnesium     REACTION: side pain  . Hydrochlorothiazide     REACTION: leg cramps, dehydration  .  Iohexol      Desc: PT HAS SWELLING TO FACE,LIPS AND THROAT WITH CONTRAST DYE   . Kapidex [Dexlansoprazole]     rash  . Losartan Potassium     REACTION: HA  . Olmesartan Medoxomil     REACTION: weak legs  . Ramipril     Per pt: unknown reaction  . Risedronate Sodium     Per pt: unknown reaction   Current Outpatient Medications on File Prior to Visit  Medication Sig Dispense Refill  . aspirin 81 MG tablet Take 81 mg by mouth daily.    . Cholecalciferol 2000 UNITS TABS Take 1 tablet by mouth daily.    . ciprofloxacin (CIPRO) 500 MG tablet Take 1 tablet (500 mg total) by mouth 2 (two) times daily. 10 tablet 0  . diphenoxylate-atropine (LOMOTIL) 2.5-0.025 MG tablet Take 1-2 tabs every six hours as needed. 30 tablet 1  . FLUoxetine (PROZAC) 10 MG capsule TAKE 1 CAPSULE BY MOUTH  DAILY 90 capsule 3  . fluticasone (FLONASE) 50 MCG/ACT nasal spray PLACE 2 SPRAYS INTO THE NOSE DAILY. 48 g 3  . ibuprofen (ADVIL,MOTRIN) 800 MG tablet Take 1 tablet (800 mg total) by mouth every 8 (eight) hours as needed. 60 tablet 1  . labetalol (NORMODYNE) 300 MG tablet TAKE 1 TABLET BY MOUTH TWO  TIMES DAILY 180 tablet 3  . LORazepam (ATIVAN) 0.5 MG tablet TAKE 1  TABLET BY MOUTH TWICE A DAY (Patient taking differently: as needed. ) 60 tablet 3  . meloxicam (MOBIC) 7.5 MG tablet Take 1 tablet (7.5 mg total) by mouth 2 (two) times daily as needed for pain. (Patient taking differently: Take 7.5 mg by mouth as needed for pain. ) 60 tablet 2  . metroNIDAZOLE (FLAGYL) 500 MG tablet Take 1 tablet (500 mg total) by mouth 3 (three) times daily. 21 tablet 0  . NONFORMULARY OR COMPOUNDED ITEM Estradiol 4.70% prefilled applicators Insert one appful vaginally twice weekly. (Patient taking differently: Estradiol 9.62% prefilled applicators Insert one appful vaginally once weekly.) 8 each 2  . pantoprazole (PROTONIX) 40 MG tablet Take 1 tablet (40 mg total) by mouth daily. 30 tablet 0  . rosuvastatin (CRESTOR) 10 MG tablet TAKE  1 TABLET BY MOUTH  DAILY 90 tablet 3  . valACYclovir (VALTREX) 500 MG tablet Take twice daily for 3-5 days 30 tablet 1   No current facility-administered medications on file prior to visit.    Review of Systems  Constitutional: Negative for other unusual diaphoresis or sweats HENT: Negative for ear discharge or swelling Eyes: Negative for other worsening visual disturbances Respiratory: Negative for stridor or other swelling  Gastrointestinal: Negative for worsening distension or other blood Genitourinary: Negative for retention or other urinary change Musculoskeletal: Negative for other MSK pain or swelling Skin: Negative for color change or other new lesions Neurological: Negative for worsening tremors and other numbness  Psychiatric/Behavioral: Negative for worsening agitation or other fatigue All other system neg per pt    Objective:   Physical Exam BP 116/72   Pulse 74   Temp 98.1 F (36.7 C) (Oral)   Ht 4\' 10"  (1.473 m)   Wt 130 lb (59 kg)   SpO2 95%   BMI 27.17 kg/m  VS noted, mild ill Constitutional: Pt appears in NAD HENT: Head: NCAT.  Right Ear: External ear normal.  Left Ear: External ear normal.  Eyes: . Pupils are equal, round, and reactive to light. Conjunctivae and EOM are normal Bilat tm's with mild erythema.  Max sinus areas mild tender.  Pharynx with mild erythema, no exudate Nose: without d/c or deformity Neck: Neck supple. Gross normal ROM Cardiovascular: Normal rate and regular rhythm.   Pulmonary/Chest: Effort normal and breath sounds without rales or wheezing.  Neurological: Pt is alert. At baseline orientation, motor grossly intact Skin: Skin is warm. No rashes, other new lesions, no LE edema Psychiatric: Pt behavior is normal without agitation  No other exam findings Lab Results  Component Value Date   WBC 4.0 02/21/2018   HGB 12.1 02/21/2018   HCT 35.7 (L) 02/21/2018   PLT 245.0 02/21/2018   GLUCOSE 97 10/25/2018   CHOL 176 10/25/2018    TRIG 132.0 10/25/2018   HDL 62.20 10/25/2018   LDLDIRECT 138.2 10/22/2010   LDLCALC 87 10/25/2018   ALT 16 10/25/2018   AST 17 10/25/2018   NA 139 10/25/2018   K 4.2 10/25/2018   CL 103 10/25/2018   CREATININE 0.82 10/25/2018   BUN 12 10/25/2018   CO2 28 10/25/2018   TSH 1.69 10/25/2018   HGBA1C 6.1 03/15/2017        Assessment & Plan:

## 2018-11-12 NOTE — Assessment & Plan Note (Signed)
stable overall by history and exam, recent data reviewed with pt, and pt to continue medical treatment as before,  to f/u any worsening symptoms or concerns  

## 2018-11-12 NOTE — Telephone Encounter (Signed)
Call returned to patient who states that she is having sinus congestion and pressure/pain to cheek bones and teeth. She rates the pain at 5. She has had symptoms since Saturday and has treated with Motrin and Allegra.  She has a cough and sore throat and stuffy ears. She has has sinus issues in the past that required antibiotic. She is using an Neti pot. Appointment scheduled per protocol. Care advice read to patient.  Patient verbalized understanding of all instructions.  Reason for Disposition . [1] Using nasal washes and pain medicine > 24 hours AND [2] sinus pain (around cheekbone or eye) persists  Answer Assessment - Initial Assessment Questions 1. LOCATION: "Where does it hurt?"      Cheeks and teeth 2. ONSET: "When did the sinus pain start?"  (e.g., hours, days)      Saturday 3. SEVERITY: "How bad is the pain?"   (Scale 1-10; mild, moderate or severe)   - MILD (1-3): doesn't interfere with normal activities    - MODERATE (4-7): interferes with normal activities (e.g., work or school) or awakens from sleep   - SEVERE (8-10): excruciating pain and patient unable to do any normal activities        5 4. RECURRENT SYMPTOM: "Have you ever had sinus problems before?" If so, ask: "When was the last time?" and "What happened that time?"      Yes took antibiotic 5. NASAL CONGESTION: "Is the nose blocked?" If so, ask, "Can you open it or must you breathe through the mouth?"     Yellow discharge after Neti 6. NASAL DISCHARGE: "Do you have discharge from your nose?" If so ask, "What color?"    yellow 7. FEVER: "Do you have a fever?" If so, ask: "What is it, how was it measured, and when did it start?"     No 8. OTHER SYMPTOMS: "Do you have any other symptoms?" (e.g., sore throat, cough, earache, difficulty breathing)     Sore throat cough stuffy ears 9. PREGNANCY: "Is there any chance you are pregnant?" "When was your last menstrual period?"     N/A  Protocols used: SINUS PAIN OR  CONGESTION-A-AH

## 2018-11-12 NOTE — Patient Instructions (Signed)
Please take all new medication as prescribed - the antibiotic, cough medicine  You can also take Mucinex (or it's generic off brand) for congestion, and tylenol as needed for pain.  Please continue all other medications as before, and refills have been done if requested - the irbesartan.  Please have the pharmacy call with any other refills you may need.  Please continue your efforts at being more active, low cholesterol diet, and weight control.  Please keep your appointments with your specialists as you may have planned

## 2018-11-12 NOTE — Assessment & Plan Note (Signed)
Mild to mod, for antibx course,  to f/u any worsening symptoms or concerns 

## 2018-11-17 ENCOUNTER — Other Ambulatory Visit: Payer: Self-pay | Admitting: Internal Medicine

## 2018-12-06 ENCOUNTER — Ambulatory Visit: Payer: Medicare Other | Admitting: Internal Medicine

## 2018-12-11 ENCOUNTER — Other Ambulatory Visit: Payer: Self-pay

## 2018-12-11 ENCOUNTER — Ambulatory Visit (INDEPENDENT_AMBULATORY_CARE_PROVIDER_SITE_OTHER): Payer: Medicare Other | Admitting: Internal Medicine

## 2018-12-11 ENCOUNTER — Encounter: Payer: Self-pay | Admitting: Internal Medicine

## 2018-12-11 VITALS — BP 124/72 | HR 65 | Temp 97.7°F | Ht <= 58 in | Wt 129.0 lb

## 2018-12-11 DIAGNOSIS — I10 Essential (primary) hypertension: Secondary | ICD-10-CM | POA: Diagnosis not present

## 2018-12-11 DIAGNOSIS — E559 Vitamin D deficiency, unspecified: Secondary | ICD-10-CM

## 2018-12-11 DIAGNOSIS — E785 Hyperlipidemia, unspecified: Secondary | ICD-10-CM

## 2018-12-11 MED ORDER — IRBESARTAN 300 MG PO TABS: 150.0000 mg | ORAL_TABLET | Freq: Two times a day (BID) | ORAL | 3 refills | Status: DC

## 2018-12-11 MED ORDER — LABETALOL HCL 300 MG PO TABS: 300.0000 mg | ORAL_TABLET | Freq: Two times a day (BID) | ORAL | 3 refills | Status: DC

## 2018-12-11 NOTE — Assessment & Plan Note (Signed)
Crestor Diet

## 2018-12-11 NOTE — Progress Notes (Signed)
Subjective:  Patient ID: Sharon Paul, female    DOB: 1946-01-20  Age: 73 y.o. MRN: [id number]  CC: No chief complaint on file.   HPI Sharon Paul presents for HTN, dyslipidemia, allergies f/u  Outpatient Medications Prior to Visit  Medication Sig Dispense Refill  . aspirin 81 MG tablet Take 81 mg by mouth daily.    . Cholecalciferol 2000 UNITS TABS Take 1 tablet by mouth daily.    . ciprofloxacin (CIPRO) 500 MG tablet Take 1 tablet (500 mg total) by mouth 2 (two) times daily. 10 tablet 0  . diphenoxylate-atropine (LOMOTIL) 2.5-0.025 MG tablet Take 1-2 tabs every six hours as needed. 30 tablet 1  . FLUoxetine (PROZAC) 10 MG capsule TAKE 1 CAPSULE BY MOUTH  DAILY 90 capsule 3  . fluticasone (FLONASE) 50 MCG/ACT nasal spray PLACE 2 SPRAYS INTO THE NOSE DAILY. 48 g 3  . ibuprofen (ADVIL,MOTRIN) 800 MG tablet Take 1 tablet (800 mg total) by mouth every 8 (eight) hours as needed. 60 tablet 1  . irbesartan (AVAPRO) 300 MG tablet Take 0.5 tablets (150 mg total) by mouth 2 (two) times daily. 90 tablet 3  . labetalol (NORMODYNE) 300 MG tablet TAKE 1 TABLET BY MOUTH TWO  TIMES DAILY 180 tablet 3  . LORazepam (ATIVAN) 0.5 MG tablet TAKE 1 TABLET BY MOUTH TWICE A DAY (Patient taking differently: as needed. ) 60 tablet 3  . meloxicam (MOBIC) 7.5 MG tablet Take 1 tablet (7.5 mg total) by mouth 2 (two) times daily as needed for pain. (Patient taking differently: Take 7.5 mg by mouth as needed for pain. ) 60 tablet 2  . metroNIDAZOLE (FLAGYL) 500 MG tablet Take 1 tablet (500 mg total) by mouth 3 (three) times daily. 21 tablet 0  . NONFORMULARY OR COMPOUNDED ITEM Estradiol 7.10% prefilled applicators Insert one appful vaginally twice weekly. (Patient taking differently: Estradiol 6.26% prefilled applicators Insert one appful vaginally once weekly.) 8 each 2  . pantoprazole (PROTONIX) 40 MG tablet Take 1 tablet (40 mg total) by mouth daily. 30 tablet 0  . rosuvastatin (CRESTOR) 10 MG  tablet TAKE 1 TABLET BY MOUTH  DAILY 90 tablet 3  . valACYclovir (VALTREX) 500 MG tablet Take twice daily for 3-5 days 30 tablet 1   No facility-administered medications prior to visit.     ROS: Review of Systems  Constitutional: Negative for activity change, appetite change, chills, fatigue and unexpected weight change.  HENT: Negative for congestion, mouth sores and sinus pressure.   Eyes: Negative for visual disturbance.  Respiratory: Negative for cough and chest tightness.   Gastrointestinal: Negative for abdominal pain and nausea.  Genitourinary: Negative for difficulty urinating, frequency and vaginal pain.  Musculoskeletal: Negative for back pain and gait problem.  Skin: Negative for pallor and rash.  Neurological: Negative for dizziness, tremors, weakness, numbness and headaches.  Psychiatric/Behavioral: Negative for confusion and sleep disturbance.    Objective:  BP 124/72 (BP Location: Left Arm, Patient Position: Sitting, Cuff Size: Normal)   Pulse 65   Temp 97.7 F (36.5 C) (Oral)   Ht 4\' 10"  (1.473 m)   Wt 129 lb (58.5 kg)   SpO2 99%   BMI 26.96 kg/m   BP Readings from Last 3 Encounters:  12/11/18 124/72  11/12/18 116/72  09/21/18 110/62    Wt Readings from Last 3 Encounters:  12/11/18 129 lb (58.5 kg)  11/12/18 130 lb (59 kg)  09/21/18 129 lb (58.5 kg)    Physical Exam Constitutional:  General: She is not in acute distress.    Appearance: She is well-developed. She is not ill-appearing or diaphoretic.  HENT:     Head: Normocephalic.     Nose: Nose normal.  Eyes:     General:        Right eye: No discharge.        Left eye: No discharge.     Conjunctiva/sclera: Conjunctivae normal.     Pupils: Pupils are equal, round, and reactive to light.  Neck:     Thyroid: No thyromegaly.     Vascular: No JVD.     Trachea: No tracheal deviation.  Musculoskeletal:        General: No swelling.  Neurological:     Mental Status: She is oriented to person,  place, and time.     Motor: No abnormal muscle tone.  Psychiatric:        Mood and Affect: Mood normal.        Behavior: Behavior normal.        Thought Content: Thought content normal.        Judgment: Judgment normal.     Lab Results  Component Value Date   WBC 4.0 02/21/2018   HGB 12.1 02/21/2018   HCT 35.7 (L) 02/21/2018   PLT 245.0 02/21/2018   GLUCOSE 97 10/25/2018   CHOL 176 10/25/2018   TRIG 132.0 10/25/2018   HDL 62.20 10/25/2018   LDLDIRECT 138.2 10/22/2010   LDLCALC 87 10/25/2018   ALT 16 10/25/2018   AST 17 10/25/2018   NA 139 10/25/2018   K 4.2 10/25/2018   CL 103 10/25/2018   CREATININE 0.82 10/25/2018   BUN 12 10/25/2018   CO2 28 10/25/2018   TSH 1.69 10/25/2018   HGBA1C 6.1 03/15/2017    No results found.  Assessment & Plan:   There are no diagnoses linked to this encounter.   No orders of the defined types were placed in this encounter.    Follow-up: No follow-ups on file.  Walker Kehr, MD

## 2018-12-11 NOTE — Assessment & Plan Note (Signed)
Labetalol, Irbesartan

## 2018-12-11 NOTE — Assessment & Plan Note (Signed)
Vit D 

## 2019-01-08 DIAGNOSIS — H25813 Combined forms of age-related cataract, bilateral: Secondary | ICD-10-CM | POA: Diagnosis not present

## 2019-01-08 DIAGNOSIS — H43392 Other vitreous opacities, left eye: Secondary | ICD-10-CM | POA: Diagnosis not present

## 2019-01-08 DIAGNOSIS — H43812 Vitreous degeneration, left eye: Secondary | ICD-10-CM | POA: Diagnosis not present

## 2019-01-15 DIAGNOSIS — M25551 Pain in right hip: Secondary | ICD-10-CM | POA: Diagnosis not present

## 2019-01-28 DIAGNOSIS — M7061 Trochanteric bursitis, right hip: Secondary | ICD-10-CM | POA: Diagnosis not present

## 2019-01-28 DIAGNOSIS — M6281 Muscle weakness (generalized): Secondary | ICD-10-CM | POA: Diagnosis not present

## 2019-01-28 DIAGNOSIS — R262 Difficulty in walking, not elsewhere classified: Secondary | ICD-10-CM | POA: Diagnosis not present

## 2019-01-28 DIAGNOSIS — M25551 Pain in right hip: Secondary | ICD-10-CM | POA: Diagnosis not present

## 2019-01-28 DIAGNOSIS — M25651 Stiffness of right hip, not elsewhere classified: Secondary | ICD-10-CM | POA: Diagnosis not present

## 2019-02-01 DIAGNOSIS — M7061 Trochanteric bursitis, right hip: Secondary | ICD-10-CM | POA: Diagnosis not present

## 2019-02-01 DIAGNOSIS — M6281 Muscle weakness (generalized): Secondary | ICD-10-CM | POA: Diagnosis not present

## 2019-02-01 DIAGNOSIS — M25551 Pain in right hip: Secondary | ICD-10-CM | POA: Diagnosis not present

## 2019-02-01 DIAGNOSIS — R262 Difficulty in walking, not elsewhere classified: Secondary | ICD-10-CM | POA: Diagnosis not present

## 2019-02-01 DIAGNOSIS — M25651 Stiffness of right hip, not elsewhere classified: Secondary | ICD-10-CM | POA: Diagnosis not present

## 2019-02-06 DIAGNOSIS — M7061 Trochanteric bursitis, right hip: Secondary | ICD-10-CM | POA: Diagnosis not present

## 2019-02-06 DIAGNOSIS — M25651 Stiffness of right hip, not elsewhere classified: Secondary | ICD-10-CM | POA: Diagnosis not present

## 2019-02-06 DIAGNOSIS — R262 Difficulty in walking, not elsewhere classified: Secondary | ICD-10-CM | POA: Diagnosis not present

## 2019-02-06 DIAGNOSIS — M25551 Pain in right hip: Secondary | ICD-10-CM | POA: Diagnosis not present

## 2019-02-06 DIAGNOSIS — M6281 Muscle weakness (generalized): Secondary | ICD-10-CM | POA: Diagnosis not present

## 2019-02-08 DIAGNOSIS — R262 Difficulty in walking, not elsewhere classified: Secondary | ICD-10-CM | POA: Diagnosis not present

## 2019-02-08 DIAGNOSIS — M6281 Muscle weakness (generalized): Secondary | ICD-10-CM | POA: Diagnosis not present

## 2019-02-08 DIAGNOSIS — M25651 Stiffness of right hip, not elsewhere classified: Secondary | ICD-10-CM | POA: Diagnosis not present

## 2019-02-08 DIAGNOSIS — M7061 Trochanteric bursitis, right hip: Secondary | ICD-10-CM | POA: Diagnosis not present

## 2019-02-08 DIAGNOSIS — M25551 Pain in right hip: Secondary | ICD-10-CM | POA: Diagnosis not present

## 2019-02-13 DIAGNOSIS — M7061 Trochanteric bursitis, right hip: Secondary | ICD-10-CM | POA: Diagnosis not present

## 2019-02-13 DIAGNOSIS — M25551 Pain in right hip: Secondary | ICD-10-CM | POA: Diagnosis not present

## 2019-02-13 DIAGNOSIS — R262 Difficulty in walking, not elsewhere classified: Secondary | ICD-10-CM | POA: Diagnosis not present

## 2019-02-13 DIAGNOSIS — M25651 Stiffness of right hip, not elsewhere classified: Secondary | ICD-10-CM | POA: Diagnosis not present

## 2019-02-13 DIAGNOSIS — M6281 Muscle weakness (generalized): Secondary | ICD-10-CM | POA: Diagnosis not present

## 2019-02-15 DIAGNOSIS — M25551 Pain in right hip: Secondary | ICD-10-CM | POA: Diagnosis not present

## 2019-02-15 DIAGNOSIS — M7061 Trochanteric bursitis, right hip: Secondary | ICD-10-CM | POA: Diagnosis not present

## 2019-02-15 DIAGNOSIS — M25651 Stiffness of right hip, not elsewhere classified: Secondary | ICD-10-CM | POA: Diagnosis not present

## 2019-02-15 DIAGNOSIS — R262 Difficulty in walking, not elsewhere classified: Secondary | ICD-10-CM | POA: Diagnosis not present

## 2019-02-15 DIAGNOSIS — M6281 Muscle weakness (generalized): Secondary | ICD-10-CM | POA: Diagnosis not present

## 2019-02-18 ENCOUNTER — Telehealth: Payer: Self-pay | Admitting: Internal Medicine

## 2019-02-18 MED ORDER — ROSUVASTATIN CALCIUM 10 MG PO TABS: 10.0000 mg | ORAL_TABLET | Freq: Every day | ORAL | 3 refills | Status: DC

## 2019-02-18 NOTE — Telephone Encounter (Signed)
Medication Refill - Medication: rosuvastatin (CRESTOR) 10 MG tablet    Has the patient contacted their pharmacy? Yes.   (Agent: If no, request that the patient contact the pharmacy for the refill.) (Agent: If yes, when and what did the pharmacy advise?)  Preferred Pharmacy (with phone number or street name): [facility]  Pt talked to pharmacy for medication reconciliation told the pharm that she is taking the above med every other day  Pharm is asking for an updated rx indicating this new dosage.    Agent: Please be advised that RX refills may take up to 3 business days. We ask that you follow-up with your pharmacy.

## 2019-02-18 NOTE — Telephone Encounter (Signed)
RX sent

## 2019-02-19 ENCOUNTER — Other Ambulatory Visit: Payer: Self-pay

## 2019-02-19 NOTE — Patient Outreach (Signed)
[facility] [facility]) Care Management  02/19/2019  [name] [birth year] [id number]   .Medication Adherence call to Mrs. Zeanna Sunde Hippa Identifiers Verify spoke with patient she is past due on Rosuvastatin 10 mg patient explain she was having side effects and is only taking 1 tablet every other day per doctors new instructions. Mrs. Laurin Coder is showing past due under [place].   [place] Management Direct Dial [phone]  Fax [phone] [email or web address]

## 2019-02-28 ENCOUNTER — Other Ambulatory Visit: Payer: Self-pay | Admitting: Internal Medicine

## 2019-02-28 NOTE — Telephone Encounter (Signed)
Pt is [facility] for fluoxetine 10 mg medication. Pt would like a 10 day supply of fluoxetine send to [facility]  on [address]

## 2019-03-04 MED ORDER — FLUOXETINE HCL 10 MG PO CAPS: 10.0000 mg | ORAL_CAPSULE | Freq: Every day | ORAL | 1 refills | Status: DC

## 2019-03-05 ENCOUNTER — Other Ambulatory Visit: Payer: Self-pay

## 2019-03-05 DIAGNOSIS — M6281 Muscle weakness (generalized): Secondary | ICD-10-CM | POA: Diagnosis not present

## 2019-03-05 DIAGNOSIS — R262 Difficulty in walking, not elsewhere classified: Secondary | ICD-10-CM | POA: Diagnosis not present

## 2019-03-05 DIAGNOSIS — M25651 Stiffness of right hip, not elsewhere classified: Secondary | ICD-10-CM | POA: Diagnosis not present

## 2019-03-05 DIAGNOSIS — M25551 Pain in right hip: Secondary | ICD-10-CM | POA: Diagnosis not present

## 2019-03-05 DIAGNOSIS — M7061 Trochanteric bursitis, right hip: Secondary | ICD-10-CM | POA: Diagnosis not present

## 2019-03-05 MED ORDER — NONFORMULARY OR COMPOUNDED ITEM: 0 refills | Status: DC

## 2019-03-08 ENCOUNTER — Other Ambulatory Visit: Payer: Self-pay

## 2019-03-08 MED ORDER — FLUTICASONE PROPIONATE 50 MCG/ACT NA SUSP: NASAL | 3 refills | Status: DC

## 2019-03-19 DIAGNOSIS — R262 Difficulty in walking, not elsewhere classified: Secondary | ICD-10-CM | POA: Diagnosis not present

## 2019-03-19 DIAGNOSIS — M7061 Trochanteric bursitis, right hip: Secondary | ICD-10-CM | POA: Diagnosis not present

## 2019-03-19 DIAGNOSIS — M6281 Muscle weakness (generalized): Secondary | ICD-10-CM | POA: Diagnosis not present

## 2019-03-19 DIAGNOSIS — M25551 Pain in right hip: Secondary | ICD-10-CM | POA: Diagnosis not present

## 2019-03-19 DIAGNOSIS — M25651 Stiffness of right hip, not elsewhere classified: Secondary | ICD-10-CM | POA: Diagnosis not present

## 2019-03-25 ENCOUNTER — Other Ambulatory Visit: Payer: Self-pay | Admitting: Internal Medicine

## 2019-03-26 DIAGNOSIS — M6281 Muscle weakness (generalized): Secondary | ICD-10-CM | POA: Diagnosis not present

## 2019-03-26 DIAGNOSIS — M7061 Trochanteric bursitis, right hip: Secondary | ICD-10-CM | POA: Diagnosis not present

## 2019-03-26 DIAGNOSIS — M25651 Stiffness of right hip, not elsewhere classified: Secondary | ICD-10-CM | POA: Diagnosis not present

## 2019-03-26 DIAGNOSIS — M25551 Pain in right hip: Secondary | ICD-10-CM | POA: Diagnosis not present

## 2019-03-26 DIAGNOSIS — R262 Difficulty in walking, not elsewhere classified: Secondary | ICD-10-CM | POA: Diagnosis not present

## 2019-03-28 DIAGNOSIS — M6281 Muscle weakness (generalized): Secondary | ICD-10-CM | POA: Diagnosis not present

## 2019-03-28 DIAGNOSIS — M7061 Trochanteric bursitis, right hip: Secondary | ICD-10-CM | POA: Diagnosis not present

## 2019-03-28 DIAGNOSIS — R262 Difficulty in walking, not elsewhere classified: Secondary | ICD-10-CM | POA: Diagnosis not present

## 2019-03-28 DIAGNOSIS — M25651 Stiffness of right hip, not elsewhere classified: Secondary | ICD-10-CM | POA: Diagnosis not present

## 2019-03-28 DIAGNOSIS — M25551 Pain in right hip: Secondary | ICD-10-CM | POA: Diagnosis not present

## 2019-04-04 DIAGNOSIS — M7061 Trochanteric bursitis, right hip: Secondary | ICD-10-CM | POA: Diagnosis not present

## 2019-04-04 DIAGNOSIS — R262 Difficulty in walking, not elsewhere classified: Secondary | ICD-10-CM | POA: Diagnosis not present

## 2019-04-04 DIAGNOSIS — M25551 Pain in right hip: Secondary | ICD-10-CM | POA: Diagnosis not present

## 2019-04-04 DIAGNOSIS — M25651 Stiffness of right hip, not elsewhere classified: Secondary | ICD-10-CM | POA: Diagnosis not present

## 2019-04-04 DIAGNOSIS — M6281 Muscle weakness (generalized): Secondary | ICD-10-CM | POA: Diagnosis not present

## 2019-04-08 DIAGNOSIS — M7061 Trochanteric bursitis, right hip: Secondary | ICD-10-CM | POA: Diagnosis not present

## 2019-04-08 DIAGNOSIS — R262 Difficulty in walking, not elsewhere classified: Secondary | ICD-10-CM | POA: Diagnosis not present

## 2019-04-08 DIAGNOSIS — M6281 Muscle weakness (generalized): Secondary | ICD-10-CM | POA: Diagnosis not present

## 2019-04-08 DIAGNOSIS — M25551 Pain in right hip: Secondary | ICD-10-CM | POA: Diagnosis not present

## 2019-04-08 DIAGNOSIS — M25651 Stiffness of right hip, not elsewhere classified: Secondary | ICD-10-CM | POA: Diagnosis not present

## 2019-04-09 ENCOUNTER — Other Ambulatory Visit: Payer: Self-pay

## 2019-04-10 ENCOUNTER — Other Ambulatory Visit: Payer: Self-pay

## 2019-04-10 ENCOUNTER — Ambulatory Visit (INDEPENDENT_AMBULATORY_CARE_PROVIDER_SITE_OTHER): Payer: Medicare Other | Admitting: Gynecology

## 2019-04-10 ENCOUNTER — Encounter: Payer: Self-pay | Admitting: Gynecology

## 2019-04-10 VITALS — BP 118/66 | Ht 59.0 in | Wt 128.0 lb

## 2019-04-10 DIAGNOSIS — N952 Postmenopausal atrophic vaginitis: Secondary | ICD-10-CM

## 2019-04-10 DIAGNOSIS — B001 Herpesviral vesicular dermatitis: Secondary | ICD-10-CM | POA: Diagnosis not present

## 2019-04-10 DIAGNOSIS — Z01419 Encounter for gynecological examination (general) (routine) without abnormal findings: Secondary | ICD-10-CM | POA: Diagnosis not present

## 2019-04-10 DIAGNOSIS — M8580 Other specified disorders of bone density and structure, unspecified site: Secondary | ICD-10-CM | POA: Diagnosis not present

## 2019-04-10 MED ORDER — VALACYCLOVIR HCL 1 G PO TABS: ORAL_TABLET | ORAL | 1 refills | Status: AC

## 2019-04-10 NOTE — Patient Instructions (Signed)
Follow-up for the bone density as scheduled.  With the earliest onset of symptoms of a cold sore take 2 of the Valtrex pills.  Repeat that dose 12 hours later.  This should shorten the length of the outbreak.  Follow-up in 1 year for annual exam

## 2019-04-10 NOTE — Progress Notes (Signed)
    Brezlyn Manrique 09-Jun-1946 [id number]        73 y.o.  [id number] for annual gynecologic exam.  Without gynecologic complaints.  Several issues noted below.  Past medical history,surgical history, problem list, medications, allergies, family history and social history were all reviewed and documented as reviewed in the EPIC chart.  ROS:  Performed with pertinent positives and negatives included in the history, assessment and plan.   Additional significant findings : None   Exam: Caryn Bee assistant Vitals:   04/10/19 1525  BP: 118/66  Weight: 128 lb (58.1 kg)  Height: 4\' 11"  (1.499 m)   Body mass index is 25.85 kg/m.  General appearance:  Normal affect, orientation and appearance. Skin: Grossly normal HEENT: Without gross lesions.  No cervical or supraclavicular adenopathy. Thyroid normal.  Lungs:  Clear without wheezing, rales or rhonchi Cardiac: RR, without RMG Abdominal:  Soft, nontender, without masses, guarding, rebound, organomegaly or hernia Breasts:  Examined lying and sitting without masses, retractions, discharge or axillary adenopathy. Pelvic:  Ext, BUS, Vagina: With atrophic changes  Adnexa: Without masses or tenderness    Anus and perineum: Normal   Rectovaginal: Normal sphincter tone without palpated masses or tenderness.    Assessment/Plan:  73 y.o. [id number] female for annual gynecologic exam.  Status post hysterectomy for leiomyoma 1993  1. Postmenopausal.  Uses estradiol cream vaginally once weekly with relief of her vaginal dryness.  Has supply but will call when needs more.  With discussed previously the risks of absorption with systemic effects and she understands and accepts these risks. 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  2 g twice daily for the first day #12 prescribed with 1 refill. 3. Osteopenia.  DEXA 2018 T score -1.8 FRAX 6% / 1.2%.  Recommend follow-up DEXA now at 2-year interval and she will schedule in follow-up  for this. 4. Pap smear 2012.  No Pap smear done today.  No history of abnormal Pap smears.  We both agree to stop screening per current screening guidelines. 5. Colonoscopy 2014.  Repeat at their recommended interval. 6. Mammography 05/2018.  Continue with annual mammography this coming fall.  Breast exam normal today. 7. Health maintenance.  No routine lab work done as patient does this elsewhere.  Follow-up 1 year, sooner as needed.   Anastasio Auerbach MD, 4:01 PM 04/10/2019

## 2019-04-12 DIAGNOSIS — M25651 Stiffness of right hip, not elsewhere classified: Secondary | ICD-10-CM | POA: Diagnosis not present

## 2019-04-12 DIAGNOSIS — M25551 Pain in right hip: Secondary | ICD-10-CM | POA: Diagnosis not present

## 2019-04-12 DIAGNOSIS — M7061 Trochanteric bursitis, right hip: Secondary | ICD-10-CM | POA: Diagnosis not present

## 2019-04-12 DIAGNOSIS — M6281 Muscle weakness (generalized): Secondary | ICD-10-CM | POA: Diagnosis not present

## 2019-04-12 DIAGNOSIS — R262 Difficulty in walking, not elsewhere classified: Secondary | ICD-10-CM | POA: Diagnosis not present

## 2019-04-15 DIAGNOSIS — M25651 Stiffness of right hip, not elsewhere classified: Secondary | ICD-10-CM | POA: Diagnosis not present

## 2019-04-15 DIAGNOSIS — M6281 Muscle weakness (generalized): Secondary | ICD-10-CM | POA: Diagnosis not present

## 2019-04-15 DIAGNOSIS — R262 Difficulty in walking, not elsewhere classified: Secondary | ICD-10-CM | POA: Diagnosis not present

## 2019-04-15 DIAGNOSIS — M25551 Pain in right hip: Secondary | ICD-10-CM | POA: Diagnosis not present

## 2019-04-15 DIAGNOSIS — M7061 Trochanteric bursitis, right hip: Secondary | ICD-10-CM | POA: Diagnosis not present

## 2019-04-23 DIAGNOSIS — R262 Difficulty in walking, not elsewhere classified: Secondary | ICD-10-CM | POA: Diagnosis not present

## 2019-04-23 DIAGNOSIS — M7061 Trochanteric bursitis, right hip: Secondary | ICD-10-CM | POA: Diagnosis not present

## 2019-04-23 DIAGNOSIS — M25651 Stiffness of right hip, not elsewhere classified: Secondary | ICD-10-CM | POA: Diagnosis not present

## 2019-04-23 DIAGNOSIS — M25551 Pain in right hip: Secondary | ICD-10-CM | POA: Diagnosis not present

## 2019-04-23 DIAGNOSIS — M6281 Muscle weakness (generalized): Secondary | ICD-10-CM | POA: Diagnosis not present

## 2019-04-25 DIAGNOSIS — M25651 Stiffness of right hip, not elsewhere classified: Secondary | ICD-10-CM | POA: Diagnosis not present

## 2019-04-25 DIAGNOSIS — M6281 Muscle weakness (generalized): Secondary | ICD-10-CM | POA: Diagnosis not present

## 2019-04-25 DIAGNOSIS — R262 Difficulty in walking, not elsewhere classified: Secondary | ICD-10-CM | POA: Diagnosis not present

## 2019-04-25 DIAGNOSIS — M25551 Pain in right hip: Secondary | ICD-10-CM | POA: Diagnosis not present

## 2019-04-25 DIAGNOSIS — M7061 Trochanteric bursitis, right hip: Secondary | ICD-10-CM | POA: Diagnosis not present

## 2019-04-30 DIAGNOSIS — M25551 Pain in right hip: Secondary | ICD-10-CM | POA: Diagnosis not present

## 2019-04-30 DIAGNOSIS — R262 Difficulty in walking, not elsewhere classified: Secondary | ICD-10-CM | POA: Diagnosis not present

## 2019-04-30 DIAGNOSIS — M7061 Trochanteric bursitis, right hip: Secondary | ICD-10-CM | POA: Diagnosis not present

## 2019-04-30 DIAGNOSIS — M6281 Muscle weakness (generalized): Secondary | ICD-10-CM | POA: Diagnosis not present

## 2019-04-30 DIAGNOSIS — M25651 Stiffness of right hip, not elsewhere classified: Secondary | ICD-10-CM | POA: Diagnosis not present

## 2019-05-02 DIAGNOSIS — M25651 Stiffness of right hip, not elsewhere classified: Secondary | ICD-10-CM | POA: Diagnosis not present

## 2019-05-02 DIAGNOSIS — M25551 Pain in right hip: Secondary | ICD-10-CM | POA: Diagnosis not present

## 2019-05-02 DIAGNOSIS — M7061 Trochanteric bursitis, right hip: Secondary | ICD-10-CM | POA: Diagnosis not present

## 2019-05-02 DIAGNOSIS — R262 Difficulty in walking, not elsewhere classified: Secondary | ICD-10-CM | POA: Diagnosis not present

## 2019-05-02 DIAGNOSIS — M6281 Muscle weakness (generalized): Secondary | ICD-10-CM | POA: Diagnosis not present

## 2019-05-03 ENCOUNTER — Other Ambulatory Visit: Payer: Self-pay

## 2019-05-07 ENCOUNTER — Ambulatory Visit (INDEPENDENT_AMBULATORY_CARE_PROVIDER_SITE_OTHER): Payer: Medicare Other

## 2019-05-07 ENCOUNTER — Other Ambulatory Visit: Payer: Self-pay

## 2019-05-07 ENCOUNTER — Other Ambulatory Visit: Payer: Self-pay | Admitting: Gynecology

## 2019-05-07 DIAGNOSIS — M8589 Other specified disorders of bone density and structure, multiple sites: Secondary | ICD-10-CM

## 2019-05-07 DIAGNOSIS — M8580 Other specified disorders of bone density and structure, unspecified site: Secondary | ICD-10-CM

## 2019-05-07 DIAGNOSIS — Z780 Asymptomatic menopausal state: Secondary | ICD-10-CM | POA: Diagnosis not present

## 2019-05-08 ENCOUNTER — Encounter: Payer: Self-pay | Admitting: Gynecology

## 2019-05-08 NOTE — Telephone Encounter (Signed)
I would stay on vitamin D at 1000 units daily.

## 2019-05-10 DIAGNOSIS — R262 Difficulty in walking, not elsewhere classified: Secondary | ICD-10-CM | POA: Diagnosis not present

## 2019-05-10 DIAGNOSIS — M7061 Trochanteric bursitis, right hip: Secondary | ICD-10-CM | POA: Diagnosis not present

## 2019-05-10 DIAGNOSIS — M25651 Stiffness of right hip, not elsewhere classified: Secondary | ICD-10-CM | POA: Diagnosis not present

## 2019-05-10 DIAGNOSIS — M6281 Muscle weakness (generalized): Secondary | ICD-10-CM | POA: Diagnosis not present

## 2019-05-10 DIAGNOSIS — M25551 Pain in right hip: Secondary | ICD-10-CM | POA: Diagnosis not present

## 2019-05-14 DIAGNOSIS — M25651 Stiffness of right hip, not elsewhere classified: Secondary | ICD-10-CM | POA: Diagnosis not present

## 2019-05-14 DIAGNOSIS — R262 Difficulty in walking, not elsewhere classified: Secondary | ICD-10-CM | POA: Diagnosis not present

## 2019-05-14 DIAGNOSIS — M25551 Pain in right hip: Secondary | ICD-10-CM | POA: Diagnosis not present

## 2019-05-14 DIAGNOSIS — M6281 Muscle weakness (generalized): Secondary | ICD-10-CM | POA: Diagnosis not present

## 2019-05-14 DIAGNOSIS — M7061 Trochanteric bursitis, right hip: Secondary | ICD-10-CM | POA: Diagnosis not present

## 2019-05-21 ENCOUNTER — Other Ambulatory Visit: Payer: Self-pay | Admitting: Internal Medicine

## 2019-05-21 ENCOUNTER — Other Ambulatory Visit: Payer: Self-pay

## 2019-05-21 MED ORDER — ROSUVASTATIN CALCIUM 10 MG PO TABS: 10.0000 mg | ORAL_TABLET | ORAL | 3 refills | Status: DC

## 2019-05-21 NOTE — Telephone Encounter (Signed)
Medication: rosuvastatin (CRESTOR) 10 MG tablet  Pharmacy states pt is taking medication every other day due to mild side effects.  Please update rx and re send  Preferred Pharmacy:   [place], [place] [facility] [phone] (Phone) [phone] (Fax)

## 2019-05-21 NOTE — Patient Outreach (Signed)
[facility] [facility]) Care Management  05/21/2019  Sharon Paul 02-13-1946 [id number]   Medication Adherence call to Sharon Paul Hippa Identifiers Verify spoke with patient she is past due on Rosuvastatin 10 mg patient explain she is only taking it every other day or 4 times a week per doctors instruction (not one tablet daily). Sharon Paul is showing past due under [place].   [place] Management Direct Dial [phone]  Fax [phone] [email or web address]

## 2019-05-21 NOTE — Telephone Encounter (Signed)
Okay to change RX?

## 2019-05-27 DIAGNOSIS — M25651 Stiffness of right hip, not elsewhere classified: Secondary | ICD-10-CM | POA: Diagnosis not present

## 2019-05-27 DIAGNOSIS — M6281 Muscle weakness (generalized): Secondary | ICD-10-CM | POA: Diagnosis not present

## 2019-05-27 DIAGNOSIS — M25551 Pain in right hip: Secondary | ICD-10-CM | POA: Diagnosis not present

## 2019-05-27 DIAGNOSIS — M7061 Trochanteric bursitis, right hip: Secondary | ICD-10-CM | POA: Diagnosis not present

## 2019-05-27 DIAGNOSIS — R262 Difficulty in walking, not elsewhere classified: Secondary | ICD-10-CM | POA: Diagnosis not present

## 2019-06-05 ENCOUNTER — Encounter: Payer: Self-pay | Admitting: Gynecology

## 2019-06-06 ENCOUNTER — Other Ambulatory Visit: Payer: Self-pay

## 2019-06-06 ENCOUNTER — Ambulatory Visit (INDEPENDENT_AMBULATORY_CARE_PROVIDER_SITE_OTHER): Payer: Medicare Other | Admitting: *Deleted

## 2019-06-06 VITALS — BP 138/80 | HR 62 | Resp 17 | Ht 59.0 in | Wt 128.0 lb

## 2019-06-06 DIAGNOSIS — Z23 Encounter for immunization: Secondary | ICD-10-CM | POA: Diagnosis not present

## 2019-06-06 DIAGNOSIS — Z0000 Encounter for general adult medical examination without abnormal findings: Secondary | ICD-10-CM | POA: Diagnosis not present

## 2019-06-06 NOTE — Progress Notes (Addendum)
Subjective:   Sharon Paul is a 73 y.o. female who presents for Medicare Annual (Subsequent) preventive examination.  Review of Systems:   Cardiac Risk Factors include: advanced age (>47men, >42 women);dyslipidemia;hypertension Sleep patterns: feels rested on waking, gets up 1-2 times nightly to void and sleeps 7-8 hours nightly.    Home Safety/Smoke Alarms: Feels safe in home. Smoke alarms in place.  Living environment; residence and Firearm Safety: 1-story house/ trailer. Lives with husband, no needs for DME, good support system  Seat Belt Safety/Bike Helmet: Wears seat belt.     Objective:     Vitals: BP 138/80   Pulse 62   Resp 17   Ht 4\' 11"  (1.499 m)   Wt 128 lb (58.1 kg)   SpO2 99%   BMI 25.85 kg/m   Body mass index is 25.85 kg/m.  Advanced Directives 05/29/2018  Does Patient Have a Medical Advance Directive? No  Does patient want to make changes to medical advance directive? Yes (ED - Information included in AVS)    Tobacco Social History   Tobacco Use  Smoking Status Former Smoker  . Quit date: 02/11/1970  . Years since quitting: 49.3  Smokeless Tobacco Never Used     Counseling given: Not Answered  Past Medical History:  Diagnosis Date  . Celiac sprue 2007  . CVD (cardiovascular disease)   . Foot fracture, left 2011  . GERD (gastroesophageal reflux disease)   . Hyperlipidemia   . Osteopenia 04/2019   T score -1.9 FRAX 10% / 1.9%.  Stable from prior DEXA  . PVD (peripheral vascular disease) (HCC)    mild carotid ? fibromuscular dysplasia  . Shingles   . Urinary incontinence    Past Surgical History:  Procedure Laterality Date  . APPENDECTOMY    . BREAST SURGERY     right breast biopsy  . ESOPHAGOGASTRODUODENOSCOPY    . ROTATOR CUFF REPAIR     RIGHT  . stress cardiolite  04-18-00  . VAGINAL HYSTERECTOMY  1993   leiomyomata   Family History  Problem Relation Age of Onset  . Glaucoma Mother   . Heart disease Mother   .  Hypertension Mother   . Heart disease Father   . Hypertension Father   . Stroke Father   . Hypertension Paternal Grandmother   . Lung cancer Paternal Grandmother   . Hypertension Sister   . Hypertension Brother   . Glaucoma Brother   . Colon cancer Neg Hx    Social History   Socioeconomic History  . Marital status: Married    Spouse name: Sharon Paul  . Number of children: 2  . Years of education: Not on file  . Highest education level: Not on file  Occupational History  . Occupation: semi-retired  Social Needs  . Financial resource strain: Not hard at all  . Food insecurity    Worry: Never true    Inability: Never true  . Transportation needs    Medical: No    Non-medical: No  Tobacco Use  . Smoking status: Former Smoker    Quit date: 02/11/1970    Years since quitting: 49.3  . Smokeless tobacco: Never Used  Substance and Sexual Activity  . Alcohol use: Yes    Alcohol/week: 0.0 standard drinks    Comment: very rare  . Drug use: No  . Sexual activity: Yes    Birth control/protection: Surgical, Post-menopausal    Comment: 1st intercourse 73 yo-Fewer than 5 partners,des neg  Lifestyle  .  Physical activity    Days per week: 3 days    Minutes per session: 30 min  . Stress: Not at all  Relationships  . Social connections    Talks on phone: More than three times a week    Gets together: More than three times a week    Attends religious service: More than 4 times per year    Active member of club or organization: Yes    Attends meetings of clubs or organizations: More than 4 times per year    Relationship status: Married  Other Topics Concern  . Not on file  Social History Narrative  . Not on file    Outpatient Encounter Medications as of 06/06/2019  Medication Sig  . acetaminophen (TYLENOL) 500 MG tablet Take 500 mg by mouth every 6 (six) hours as needed.  [name] aspirin 81 MG tablet Take 81 mg by mouth daily.  . Cholecalciferol 2000 UNITS TABS Take 1 tablet by  mouth daily.  [name] FLUoxetine (PROZAC) 10 MG capsule Take 1 capsule (10 mg total) by mouth daily.  . fluticasone (FLONASE) 50 MCG/ACT nasal spray PLACE 2 SPRAYS INTO THE NOSE DAILY.  [name] ibuprofen (ADVIL,MOTRIN) 800 MG tablet Take 1 tablet (800 mg total) by mouth every 8 (eight) hours as needed.  . irbesartan (AVAPRO) 300 MG tablet Take 0.5 tablets (150 mg total) by mouth 2 (two) times daily.  [name] labetalol (NORMODYNE) 300 MG tablet Take 1 tablet (300 mg total) by mouth 2 (two) times daily.  [name] LORazepam (ATIVAN) 0.5 MG tablet TAKE 1 TABLET BY MOUTH TWICE A DAY (Patient taking differently: 0.5 mg as needed. )  . meloxicam (MOBIC) 7.5 MG tablet TAKE 1 TABLET BY MOUTH TWICE A DAY AS NEEDED FOR PAIN  . NONFORMULARY OR COMPOUNDED ITEM Estradiol [id number] prefilled applicators Insert one appful vaginally twice weekly.  . pantoprazole (PROTONIX) 40 MG tablet Take 1 tablet (40 mg total) by mouth daily.  . rosuvastatin (CRESTOR) 10 MG tablet Take 1 tablet (10 mg total) by mouth every other day.  . valACYclovir (VALTREX) 1000 MG tablet Take 2 tablets at the earliest onset of cold sore symptoms.  Repeat with 2 tablets 12 hours later   No facility-administered encounter medications on file as of 06/06/2019.     Activities of Daily Living In your present state of health, do you have any difficulty performing the following activities: 06/06/2019  Hearing? N  Vision? N  Difficulty concentrating or making decisions? N  Walking or climbing stairs? N  Dressing or bathing? N  Doing errands, shopping? N  Preparing Food and eating ? N  Using the Toilet? N  In the past six months, have you accidently leaked urine? N  Do you have problems with loss of bowel control? N  Managing your Medications? N  Managing your Finances? N  Housekeeping or managing your Housekeeping? N  Some recent data might be hidden    Patient Care Team: Plotnikov, Evie Lacks, MD as PCP - General Armbruster, Carlota Raspberry, MD as Consulting Physician  (Gastroenterology)    Assessment:   This is a routine wellness examination for Sharon Paul. Physical assessment deferred to PCP.  Exercise Activities and Dietary recommendations Current Exercise Habits: Home exercise routine, Type of exercise: walking, Time (Minutes): 30, Frequency (Times/Week): 4, Weekly Exercise (Minutes/Week): 120, Intensity: Mild, Exercise limited by: orthopedic condition(s) Diet (meal preparation, eat out, water intake, caffeinated beverages, dairy products, fruits and vegetables): in general, a "healthy" diet  , well balanced. eats a  variety of fruits and vegetables daily, limits salt, fat/cholesterol, sugar,carbohydrates,caffeine, drinks 6-8 glasses of water daily.  Goals    . Patient Stated     I want to increase physical activity by walking more routinely with my dog.       Fall Risk Fall Risk  06/06/2019 05/29/2018 11/22/2017 11/16/2016 01/26/2016  Falls in the past year? 0 No No No No  Number falls in past yr: 0 - - - -  Injury with Fall? 0 - - - -   Is the patient's home free of loose throw rugs in walkways, pet beds, electrical cords, etc?   yes      Grab bars in the bathroom? no      Handrails on the stairs?   no      Adequate lighting?   yes  Depression Screen PHQ 2/9 Scores 06/06/2019 05/29/2018 11/22/2017 11/16/2016  PHQ - 2 Score 0 0 0 0     Cognitive Function     6CIT Screen 06/06/2019  What Year? 0 points  What month? 0 points  What time? 0 points  Count back from 20 0 points  Months in reverse 0 points  Repeat phrase 0 points  Total Score 0    Immunization History  Administered Date(s) Administered  . Influenza Split 07/04/2012  . Influenza Whole 07/02/2008, 06/01/2010  . Influenza, High Dose Seasonal PF 05/31/2016, 05/26/2017, 05/29/2018  . Influenza,inj,Quad PF,6+ Mos 06/02/2015  . Influenza-Unspecified 06/16/2013, 05/29/2014  . Pneumococcal Conjugate-13 01/14/2015  . Pneumococcal Polysaccharide-23 12/04/2013  . Td 08/02/2000  . Tdap  10/06/2015  . Zoster 09/21/2009   Screening Tests Health Maintenance  Topic Date Due  . INFLUENZA VACCINE  03/30/2019  . MAMMOGRAM  06/12/2020  . COLONOSCOPY  03/07/2023  . TETANUS/TDAP  10/05/2025  . DEXA SCAN  Completed  . Hepatitis C Screening  Completed  . PNA vac Low Risk Adult  Completed      Plan:    Reviewed health maintenance screenings with patient today and relevant education, vaccines, and/or referrals were provided.   I have personally reviewed and noted the following in the patient's chart:   . Medical and social history . Use of alcohol, tobacco or illicit drugs  . Current medications and supplements . Functional ability and status . Nutritional status . Physical activity . Advanced directives . List of other physicians . Vitals . Screenings to include cognitive, depression, and falls . Referrals and appointments  In addition, I have reviewed and discussed with patient certain preventive protocols, quality metrics, and best practice recommendations. A written personalized care plan for preventive services as well as general preventive health recommendations were provided to patient.     Michiel Cowboy, RN  06/06/2019  Medical screening examination/treatment/procedure(s) were performed by non-physician practitioner and as supervising physician I was immediately available for consultation/collaboration. I agree with above. Lew Dawes, MD

## 2019-06-06 NOTE — Patient Instructions (Addendum)
Shingrix discussed. Please contact your pharmacy for coverage information.   Continue doing brain stimulating activities (puzzles, reading, adult coloring books, staying active) to keep memory sharp.  Download memory games on I-Pad.  Continue to eat heart healthy diet (full of fruits, vegetables, whole grains, lean protein, water--limit salt, fat, and sugar intake) and increase physical activity as tolerated.   Ms. Riede , Thank you for taking time to come for your Medicare Wellness Visit. I appreciate your ongoing commitment to your health goals. Please review the following plan we discussed and let me know if I can assist you in the future.   These are the goals we discussed: Goals    . Patient Stated     I want to increase physical activity by walking more routinely with my dog.       This is a list of the screening recommended for you and due dates:  Health Maintenance  Topic Date Due  . Flu Shot  03/30/2019  . Mammogram  06/12/2020  . Colon Cancer Screening  03/07/2023  . Tetanus Vaccine  10/05/2025  . DEXA scan (bone density measurement)  Completed  .  Hepatitis C: One time screening is recommended by Center for Disease Control  (CDC) for  adults born from [birth year] through 1964.   Completed  . Pneumonia vaccines  Completed     Preventive Care 15 Years and Older, Female Preventive care refers to lifestyle choices and visits with your health care provider that can promote health and wellness. This includes:  A yearly physical exam. This is also called an annual well check.  Regular dental and eye exams.  Immunizations.  Screening for certain conditions.  Healthy lifestyle choices, such as diet and exercise. What can I expect for my preventive care visit? Physical exam Your health care provider will check:  Height and weight. These may be used to calculate body mass index (BMI), which is a measurement that tells if you are at a healthy weight.  Heart rate  and blood pressure.  Your skin for abnormal spots. Counseling Your health care provider may ask you questions about:  Alcohol, tobacco, and drug use.  Emotional well-being.  Home and relationship well-being.  Sexual activity.  Eating habits.  History of falls.  Memory and ability to understand (cognition).  Work and work Statistician.  Pregnancy and menstrual history. What immunizations do I need?  Influenza (flu) vaccine  This is recommended every year. Tetanus, diphtheria, and pertussis (Tdap) vaccine  You may need a Td booster every 10 years. Varicella (chickenpox) vaccine  You may need this vaccine if you have not already been vaccinated. Zoster (shingles) vaccine  You may need this after age 8. Pneumococcal conjugate (PCV13) vaccine  One dose is recommended after age 69. Pneumococcal polysaccharide (PPSV23) vaccine  One dose is recommended after age 38. Measles, mumps, and rubella (MMR) vaccine  You may need at least one dose of MMR if you were born in 1957 or later. You may also need a second dose. Meningococcal conjugate (MenACWY) vaccine  You may need this if you have certain conditions. Hepatitis A vaccine  You may need this if you have certain conditions or if you travel or work in places where you may be exposed to hepatitis A. Hepatitis B vaccine  You may need this if you have certain conditions or if you travel or work in places where you may be exposed to hepatitis B. Haemophilus influenzae type b (Hib) vaccine  You may need  this if you have certain conditions. You may receive vaccines as individual doses or as more than one vaccine together in one shot (combination vaccines). Talk with your health care provider about the risks and benefits of combination vaccines. What tests do I need? Blood tests  Lipid and cholesterol levels. These may be checked every 5 years, or more frequently depending on your overall health.  Hepatitis C  test.  Hepatitis B test. Screening  Lung cancer screening. You may have this screening every year starting at age 27 if you have a 30-pack-year history of smoking and currently smoke or have quit within the past 15 years.  Colorectal cancer screening. All adults should have this screening starting at age 77 and continuing until age 59. Your health care provider may recommend screening at age 4 if you are at increased risk. You will have tests every 1-10 years, depending on your results and the type of screening test.  Diabetes screening. This is done by checking your blood sugar (glucose) after you have not eaten for a while (fasting). You may have this done every 1-3 years.  Mammogram. This may be done every 1-2 years. Talk with your health care provider about how often you should have regular mammograms.  BRCA-related cancer screening. This may be done if you have a family history of breast, ovarian, tubal, or peritoneal cancers. Other tests  Sexually transmitted disease (STD) testing.  Bone density scan. This is done to screen for osteoporosis. You may have this done starting at age 30. Follow these instructions at home: Eating and drinking  Eat a diet that includes fresh fruits and vegetables, whole grains, lean protein, and low-fat dairy products. Limit your intake of foods with high amounts of sugar, saturated fats, and salt.  Take vitamin and mineral supplements as recommended by your health care provider.  Do not drink alcohol if your health care provider tells you not to drink.  If you drink alcohol: ? Limit how much you have to 0-1 drink a day. ? Be aware of how much alcohol is in your drink. In the [place] one drink equals one 12 oz bottle of beer (355 mL), one 5 oz glass of Karys Meckley (148 mL), or one 1 oz glass of hard liquor (44 mL). Lifestyle  Take daily care of your teeth and gums.  Stay active. Exercise for at least 30 minutes on 5 or more days each week.  Do not use  any products that contain nicotine or tobacco, such as cigarettes, e-cigarettes, and chewing tobacco. If you need help quitting, ask your health care provider.  If you are sexually active, practice safe sex. Use a condom or other form of protection in order to prevent STIs (sexually transmitted infections).  Talk with your health care provider about taking a low-dose aspirin or statin. What's next?  Go to your health care provider once a year for a well check visit.  Ask your health care provider how often you should have your eyes and teeth checked.  Stay up to date on all vaccines. This information is not intended to replace advice given to you by your health care provider. Make sure you discuss any questions you have with your health care provider. Document Released: 09/11/2015 Document Revised: 08/09/2018 Document Reviewed: 08/09/2018 Elsevier Patient Education  2020 [employer].

## 2019-06-18 DIAGNOSIS — Z1231 Encounter for screening mammogram for malignant neoplasm of breast: Secondary | ICD-10-CM | POA: Diagnosis not present

## 2019-06-18 LAB — HM MAMMOGRAPHY

## 2019-06-21 ENCOUNTER — Other Ambulatory Visit: Payer: Self-pay | Admitting: Internal Medicine

## 2019-06-21 ENCOUNTER — Encounter: Payer: Self-pay | Admitting: Internal Medicine

## 2019-06-26 DIAGNOSIS — H25813 Combined forms of age-related cataract, bilateral: Secondary | ICD-10-CM | POA: Diagnosis not present

## 2019-06-26 DIAGNOSIS — H43812 Vitreous degeneration, left eye: Secondary | ICD-10-CM | POA: Diagnosis not present

## 2019-06-26 DIAGNOSIS — H43392 Other vitreous opacities, left eye: Secondary | ICD-10-CM | POA: Diagnosis not present

## 2019-06-26 DIAGNOSIS — H02831 Dermatochalasis of right upper eyelid: Secondary | ICD-10-CM | POA: Diagnosis not present

## 2019-06-26 DIAGNOSIS — H02834 Dermatochalasis of left upper eyelid: Secondary | ICD-10-CM | POA: Diagnosis not present

## 2019-07-03 DIAGNOSIS — M25512 Pain in left shoulder: Secondary | ICD-10-CM | POA: Diagnosis not present

## 2019-07-15 DIAGNOSIS — M25512 Pain in left shoulder: Secondary | ICD-10-CM | POA: Diagnosis not present

## 2019-07-22 DIAGNOSIS — M25512 Pain in left shoulder: Secondary | ICD-10-CM | POA: Diagnosis not present

## 2019-07-23 ENCOUNTER — Encounter: Payer: Self-pay | Admitting: Internal Medicine

## 2019-07-23 ENCOUNTER — Ambulatory Visit (HOSPITAL_COMMUNITY)
Admission: EM | Admit: 2019-07-23 | Discharge: 2019-07-23 | Disposition: A | Discharge status: Home / Self Care | Payer: Medicare Other

## 2019-07-23 ENCOUNTER — Other Ambulatory Visit: Payer: Self-pay

## 2019-07-23 ENCOUNTER — Ambulatory Visit (INDEPENDENT_AMBULATORY_CARE_PROVIDER_SITE_OTHER): Payer: Medicare Other | Admitting: Internal Medicine

## 2019-07-23 DIAGNOSIS — Z20822 Contact with and (suspected) exposure to covid-19: Secondary | ICD-10-CM

## 2019-07-23 DIAGNOSIS — J019 Acute sinusitis, unspecified: Secondary | ICD-10-CM | POA: Diagnosis not present

## 2019-07-23 DIAGNOSIS — R6883 Chills (without fever): Secondary | ICD-10-CM | POA: Diagnosis not present

## 2019-07-23 MED ORDER — AZITHROMYCIN 250 MG PO TABS: ORAL_TABLET | ORAL | 0 refills | Status: DC

## 2019-07-23 MED ORDER — FLUCONAZOLE 150 MG PO TABS: 150.0000 mg | ORAL_TABLET | Freq: Once | ORAL | 1 refills | Status: AC

## 2019-07-23 NOTE — ED Notes (Signed)
Patient LWBS after speaking with Dr. Lanny Cramp regarding unnecessary request for rapid covid test. Patient was tested this morning but did not want to wait for results and so came here for the rapid test. Patient verbalizes understanding of isolating until results are available, will check her MyChart for test results.

## 2019-07-23 NOTE — Assessment & Plan Note (Signed)
Zpac Diflucan if needed po COVID 19 test

## 2019-07-23 NOTE — Progress Notes (Signed)
Virtual Visit via Video Note  I connected with Sharon Paul on 07/23/19 at  9:30 AM EST by a video enabled telemedicine application and verified that I am speaking with the correct person using two identifiers.   I discussed the limitations of evaluation and management by telemedicine and the availability of in person appointments. The patient expressed understanding and agreed to proceed.  History of Present Illness: C/o sinus HAs, chills, joints, teeth are aching. No fever  There has been no runny nose, cough, chest pain, shortness of breath, abdominal pain, diarrhea, constipation,  skin rashes.   Observations/Objective: The patient appears to be in no acute distress.  Assessment and Plan:  See my Assessment and Plan. Follow Up Instructions:    I discussed the assessment and treatment plan with the patient. The patient was provided an opportunity to ask questions and all were answered. The patient agreed with the plan and demonstrated an understanding of the instructions.   The patient was advised to call back or seek an in-person evaluation if the symptoms worsen or if the condition fails to improve as anticipated.  I provided face-to-face time during this encounter. We were at different locations.   Walker Kehr, MD

## 2019-07-25 LAB — NOVEL CORONAVIRUS, NAA: SARS-CoV-2, NAA: NOT DETECTED

## 2019-08-05 DIAGNOSIS — S46092D Other injury of muscle(s) and tendon(s) of the rotator cuff of left shoulder, subsequent encounter: Secondary | ICD-10-CM | POA: Diagnosis not present

## 2019-08-05 DIAGNOSIS — M25512 Pain in left shoulder: Secondary | ICD-10-CM | POA: Diagnosis not present

## 2019-08-17 ENCOUNTER — Other Ambulatory Visit: Payer: Self-pay

## 2019-08-17 ENCOUNTER — Encounter: Payer: Self-pay | Admitting: Emergency Medicine

## 2019-08-17 ENCOUNTER — Ambulatory Visit
Admission: EM | Admit: 2019-08-17 | Discharge: 2019-08-17 | Disposition: A | Discharge status: Home / Self Care | Payer: Medicare Other | Attending: Physician Assistant | Admitting: Physician Assistant

## 2019-08-17 DIAGNOSIS — J069 Acute upper respiratory infection, unspecified: Secondary | ICD-10-CM

## 2019-08-17 DIAGNOSIS — Z20828 Contact with and (suspected) exposure to other viral communicable diseases: Secondary | ICD-10-CM | POA: Diagnosis not present

## 2019-08-17 MED ORDER — AZELASTINE HCL 0.1 % NA SOLN: 2.0000 | Freq: Two times a day (BID) | NASAL | 0 refills | Status: DC

## 2019-08-17 NOTE — Discharge Instructions (Signed)
COVID PCR testing ordered. I would like you to quarantine until testing results. Start azelastine nasal spray.  If experiencing shortness of breath, trouble breathing, go to the emergency department for further evaluation needed.

## 2019-08-17 NOTE — ED Triage Notes (Addendum)
Pt states she was dx with a sinus infection three weeks ago, states her husband also was sick and she felt like she became sick again. Pt c/o congestion, drainage, headache. States she was given antibiotics three weeks ago and felt better for a week, then this week had symptoms again. Pt states she has an altered sense of smell

## 2019-08-17 NOTE — ED Provider Notes (Signed)
[name]    CSN: [id number] Arrival date & time: 08/17/19  1139      History   Chief Complaint Chief Complaint  Patient presents with  . Headache  . Nasal Congestion    HPI Sharon Paul is a 73 y.o. female.   73 year old female comes in for 4-day history of URI symptoms.  States she was treated for sinusitis about 3 weeks ago with azithromycin, she had 1 week of resolution of symptoms prior to current symptom onset.  She has had sinus pressure, rhinorrhea, postnasal drip.  She denies cough.  Has had chills without fever.  Denies body aches.  Denies abdominal pain, nausea, vomiting, diarrhea.  Denies shortness of breath, loss of taste or smell.  Has felt that smell has changed, but thinks this is due to medication she is putting to her nostril.  States has been started with URI symptoms prior to her symptoms starting, unknown Covid status.     Past Medical History:  Diagnosis Date  . Celiac sprue 2007  . CVD (cardiovascular disease)   . Foot fracture, left 2011  . GERD (gastroesophageal reflux disease)   . Hyperlipidemia   . Osteopenia 04/2019   T score -1.9 FRAX 10% / 1.9%.  Stable from prior DEXA  . PVD (peripheral vascular disease) (HCC)    mild carotid ? fibromuscular dysplasia  . Shingles   . Urinary incontinence     Patient Active Problem List   Diagnosis Date Noted  . Acute sinus infection 11/12/2018  . Shingles outbreak 09/04/2018  . Hip pain 09/04/2018  . LUQ pain 07/19/2018  . Bloating 07/19/2018  . 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 Viral URI with cough 04/25/2017  . Urinary frequency 11/20/2016  . Lactose intolerance in adult 09/13/2016  . 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 Well adult exam 12/04/2013  . Back pain, thoracic 08/13/2012  . 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 Urinary incontinence   . Right shoulder pain 12/13/2011  . Sciatica of right side 07/06/2011  . Hyperglycemia 06/07/2011  . Abdominal pain 04/21/2011  .  WEAKNESS 09/21/2010  . CAROTID OCCLUSIVE DISEASE 09/07/2010  . WRIST PAIN 06/25/2010  . DIZZINESS 07/28/2009  . TOBACCO USE, QUIT 07/28/2009  . Pain in joint 05/26/2009  . Lumbago 11/05/2008  . Irritable bowel syndrome 06/27/2008  . GASTRITIS, HX OF 06/27/2008  . Rash and other nonspecific skin eruption 06/02/2008  . Dyslipidemia 04/23/2008  . Generalized anxiety disorder 04/23/2008  . DIVERTICULOSIS, COLON 04/23/2008  . COLONIC POLYPS, HX OF 04/23/2008  . GERD 04/02/2008  . Allergic rhinitis 12/11/2007  . Vitamin D deficiency 08/15/2007  . Peripheral vascular disease ([employer]) 08/15/2007  . Essential hypertension 03/24/2007    Past Surgical History:  Procedure Laterality Date  . APPENDECTOMY    . BREAST SURGERY     right breast biopsy  . ESOPHAGOGASTRODUODENOSCOPY    . ROTATOR CUFF REPAIR     RIGHT  . stress cardiolite  04-18-00  . VAGINAL HYSTERECTOMY  1993   leiomyomata    OB History    Gravida  3   Para  2   Term  2   Preterm      AB  1   Living  2     SAB      TAB      Ectopic      Multiple      Live Births               Home Medications  Prior to Admission medications   Medication Sig Start Date End Date Taking? Authorizing Provider  acetaminophen (TYLENOL) 500 MG tablet Take 500 mg by mouth every 6 (six) hours as needed.    [provider]  aspirin 81 MG tablet Take 81 mg by mouth daily.    [provider]  azelastine (ASTELIN) 0.1 % nasal spray Place 2 sprays into both nostrils 2 (two) times daily. 08/17/19   Ok Edwards, PA-C  Cholecalciferol 2000 UNITS TABS Take 1 tablet by mouth daily.    [provider]  FLUoxetine (PROZAC) 10 MG capsule Take 1 capsule (10 mg total) by mouth daily. 03/04/19   Plotnikov, Evie Lacks, MD  fluticasone (FLONASE) 50 MCG/ACT nasal spray PLACE 2 SPRAYS INTO THE NOSE DAILY. 03/08/19   Plotnikov, Evie Lacks, MD  ibuprofen (ADVIL,MOTRIN) 800 MG tablet Take 1 tablet (800 mg total) by mouth  every 8 (eight) hours as needed. 03/28/18   Fontaine, Belinda Block, MD  irbesartan (AVAPRO) 300 MG tablet Take 0.5 tablets (150 mg total) by mouth 2 (two) times daily. 12/11/18   Plotnikov, Evie Lacks, MD  labetalol (NORMODYNE) 300 MG tablet Take 1 tablet (300 mg total) by mouth 2 (two) times daily. 12/11/18   Plotnikov, Evie Lacks, MD  LORazepam (ATIVAN) 0.5 MG tablet TAKE 1 TABLET BY MOUTH TWICE A DAY Patient taking differently: 0.5 mg as needed.  06/29/16   Plotnikov, Evie Lacks, MD  meloxicam (MOBIC) 7.5 MG tablet TAKE 1 TABLET BY MOUTH TWICE A DAY AS NEEDED FOR PAIN 03/25/19   Plotnikov, Evie Lacks, MD  NONFORMULARY OR COMPOUNDED ITEM Estradiol [id number] prefilled applicators Insert one appful vaginally twice weekly. 03/05/19   Fontaine, Belinda Block, MD  pantoprazole (PROTONIX) 40 MG tablet Take 1 tablet (40 mg total) by mouth daily. Office visit needed before refills will be given 06/21/19   Plotnikov, Evie Lacks, MD  rosuvastatin (CRESTOR) 10 MG tablet Take 1 tablet (10 mg total) by mouth every other day. 05/21/19   Plotnikov, Evie Lacks, MD  valACYclovir (VALTREX) 1000 MG tablet Take 2 tablets at the earliest onset of cold sore symptoms.  Repeat with 2 tablets 12 hours later 04/10/19   Fontaine, Belinda Block, MD    Family History Family History  Problem Relation Age of Onset  . Glaucoma Mother   . Heart disease Mother   . Hypertension Mother   . Heart disease Father   . Hypertension Father   . Stroke Father   . Hypertension Paternal Grandmother   . Lung cancer Paternal Grandmother   . Hypertension Sister   . Hypertension Brother   . Glaucoma Brother   . Colon cancer Neg Hx     Social History Social History   Tobacco Use  . Smoking status: Former Smoker    Quit date: 02/11/1970    Years since quitting: 49.5  . Smokeless tobacco: Never Used  Substance Use Topics  . Alcohol use: Yes    Alcohol/week: 0.0 standard drinks    Comment: very rare  . Drug use: No     Allergies   Amlodipine,  Augmentin [amoxicillin-pot clavulanate], Clarithromycin, Codeine, Esomeprazole magnesium, Hydrochlorothiazide, Iohexol, Kapidex [dexlansoprazole], Losartan potassium, Olmesartan medoxomil, Ramipril, and Risedronate sodium   Review of Systems Review of Systems  Reason unable to perform ROS: See HPI as above.     Physical Exam Triage Vital Signs ED Triage Vitals  Enc Vitals Group     BP 08/17/19 1229 (!) 166/72     Pulse Rate 08/17/19  1229 75     Resp 08/17/19 1229 18     Temp 08/17/19 1229 99.7 F (37.6 C)     Temp src --      SpO2 08/17/19 1229 97 %     Weight --      Height --      Head Circumference --      Peak Flow --      Pain Score 08/17/19 1231 6     Pain Loc --      Pain Edu? --      Excl. in [place]? --    No data found.  Updated Vital Signs BP (!) 166/72   Pulse 75   Temp 99.7 F (37.6 C)   Resp 18   SpO2 97%   Physical Exam Constitutional:      General: She is not in acute distress.    Appearance: Normal appearance. She is not ill-appearing, toxic-appearing or diaphoretic.  HENT:     Head: Normocephalic and atraumatic.     Right Ear: Ear canal and external ear normal. A middle ear effusion is present. Tympanic membrane is not erythematous or bulging.     Left Ear: Tympanic membrane, ear canal and external ear normal.  No middle ear effusion. Tympanic membrane is not erythematous or bulging.     Nose:     Right Sinus: Maxillary sinus tenderness present. No frontal sinus tenderness.     Left Sinus: Maxillary sinus tenderness and frontal sinus tenderness present.     Mouth/Throat:     Mouth: Mucous membranes are moist.     Pharynx: Oropharynx is clear. Uvula midline.  Cardiovascular:     Rate and Rhythm: Normal rate and regular rhythm.     Heart sounds: Normal heart sounds. No murmur. No friction rub. No gallop.   Pulmonary:     Effort: Pulmonary effort is normal. No accessory muscle usage, prolonged expiration, respiratory distress or retractions.      Comments: Lungs clear to auscultation without adventitious lung sounds. Musculoskeletal:     Cervical back: Normal range of motion and neck supple.  Neurological:     General: No focal deficit present.     Mental Status: She is alert and oriented to person, place, and time.    UC Treatments / Results  Labs (all labs ordered are listed, but only abnormal results are displayed) Labs Reviewed  NOVEL CORONAVIRUS, NAA    EKG   Radiology No results found.  Procedures Procedures (including critical care time)  Medications Ordered in UC Medications - No data to display  Initial Impression / Assessment and Plan / UC Course  I have reviewed the triage vital signs and the nursing notes.  Pertinent labs & imaging results that were available during my care of the patient were reviewed by me and considered in my medical decision making (see chart for details).    COVID PCR test ordered. Patient to quarantine until testing results return. No alarming signs on exam.  Patient speaking in full sentences without respiratory distress.  Symptomatic treatment discussed.  Push fluids.  Return precautions given.  Patient expresses understanding and agrees to plan.  If patient symptom does not improve with Flonase or azelastine, okay to call in prednisone for symptomatic relief.  Final Clinical Impressions(s) / UC Diagnoses   Final diagnoses:  Acute URI   ED Prescriptions    Medication Sig Dispense Auth. Provider   azelastine (ASTELIN) 0.1 % nasal spray Place 2 sprays into both nostrils  2 (two) times daily. 30 mL Ok Edwards, PA-C     PDMP not reviewed this encounter.   Ok Edwards, PA-C 08/17/19 1317

## 2019-08-19 ENCOUNTER — Telehealth (HOSPITAL_COMMUNITY): Payer: Self-pay | Admitting: Emergency Medicine

## 2019-08-19 ENCOUNTER — Ambulatory Visit: Payer: Self-pay | Admitting: *Deleted

## 2019-08-19 LAB — NOVEL CORONAVIRUS, NAA: SARS-CoV-2, NAA: DETECTED — AB

## 2019-08-19 NOTE — Telephone Encounter (Signed)
Your test for COVID-19 was positive, meaning that you were infected with the novel coronavirus and could give the germ to others.  Please continue isolation at home for at least 10 days since the start of your symptoms. If you do not have symptoms, please isolate at home for 10 days from the day you were tested. Once you complete your 10 day quarantine, you may return to normal activities as long as you've not had a fever for over 24 hours(without taking fever reducing medicine) and your symptoms are improving. Please continue good preventive care measures, including:  frequent hand-washing, avoid touching your face, cover coughs/sneezes, stay out of crowds and keep a 6 foot distance from others.  Go to the nearest hospital emergency room if fever/cough/breathlessness are severe or illness seems like a threat to life.  Patient contacted by phone and made aware of    results. Pt verbalized understanding and had all questions answered.  Quarantine ends dec 30th

## 2019-08-19 NOTE — Telephone Encounter (Signed)
Patient would like a call back from a nurse to discuss what she should do since she received a positive covid diagnosis. She received her diagnosis on My Chart with no other explanation. Patient would like a call back at Ph# [phone] or [phone]    Pt stated that she had had a cold for 2 weeks and then went to an urgent care for her symptoms and got tested for covid. She has loss her sense of smell, having fatigue and has a little congested and cough in the morning.   She was advise by a nurse this morning regarding her results and recommendations.  Again advised of the recommendations of being in quarantine, the symptoms, calling 911 for respiratory distress, treating symptoms with OTC medications, notifying her provider of her testing, results and symptoms, getting individuals close to her getting tested, and cleaning the hard surfaces. She voiced understanding Routing to [facility] at [facility] for review.

## 2019-08-20 ENCOUNTER — Ambulatory Visit: Payer: Self-pay | Admitting: *Deleted

## 2019-08-20 ENCOUNTER — Other Ambulatory Visit: Payer: Self-pay | Admitting: Unknown Physician Specialty

## 2019-08-20 ENCOUNTER — Telehealth: Payer: Self-pay | Admitting: Unknown Physician Specialty

## 2019-08-20 DIAGNOSIS — U071 COVID-19: Secondary | ICD-10-CM

## 2019-08-20 NOTE — Telephone Encounter (Signed)
She called in wanting to know if there was anything special she needs to be taking for the COVID-19?    I let her know there is not a medication for COVID-19 at this point.    See notes below.  I answered her questions.   She thanked me for my help.    Reason for Disposition . Health Information question, no triage required and triager able to answer question  Answer Assessment - Initial Assessment Questions 1. REASON FOR CALL or QUESTION: "What is your reason for calling today?" or "How can I best help you?" or "What question do you have that I can help answer?"     I am positive for COVID-19.     Is there anything special I need to be taking for it?   I let her know there is not any medications available for COVID-19.   I let her know to rest and keep herself hydrated and of course continue your quarantine instructions given to you yesterday when a nurse made you aware of having the virus.  You mentioned diarrhea you are having,   Drink plenty of fluids, use a medication like Imodium or Pepto Bismal.  Protocols used: INFORMATION ONLY CALL-A-AH

## 2019-08-20 NOTE — Telephone Encounter (Signed)
  I connected by phone with Sharon Paul on 08/20/2019 at 2:38 PM to discuss the potential use of an new treatment for mild to moderate COVID-19 viral infection in non-hospitalized patients.  This patient is a 73 y.o. female that meets the FDA criteria for Emergency Use Authorization of bamlanivimab or casirivimab\imdevimab.  Has a (+) direct SARS-CoV-2 viral test result  Has mild or moderate COVID-19   Is ? 73 years of age and weighs ? 40 kg  Is NOT hospitalized due to COVID-19  Is NOT requiring oxygen therapy or requiring an increase in baseline oxygen flow rate due to COVID-19  Is within 10 days of symptom onset  Has at least one of the high risk factor(s) for progression to severe COVID-19 and/or hospitalization as defined in EUA.  Specific high risk criteria : >/= 72 yo and hypertension   I have spoken and communicated the following to the patient or parent/caregiver:  1. FDA has authorized the emergency use of bamlanivimab and casirivimab\imdevimab for the treatment of mild to moderate COVID-19 in adults and pediatric patients with positive results of direct SARS-CoV-2 viral testing who are 65 years of age and older weighing at least 40 kg, and who are at high risk for progressing to severe COVID-19 and/or hospitalization.  2. The significant known and potential risks and benefits of bamlanivimab and casirivimab\imdevimab, and the extent to which such potential risks and benefits are unknown.  3. Information on available alternative treatments and the risks and benefits of those alternatives, including clinical trials.  4. Patients treated with bamlanivimab and casirivimab\imdevimab should continue to self-isolate and use infection control measures (e.g., wear mask, isolate, social distance, avoid sharing personal items, clean and disinfect "high touch" surfaces, and frequent handwashing) according to CDC guidelines.   5. The patient or parent/caregiver has the option  to accept or refuse bamlanivimab or casirivimab\imdevimab .  After reviewing this information with the patient, The patient agreed to proceed with receiving the bamlanimivab infusion and will be provided a copy of the Fact sheet prior to receiving the infusion.Sharon Paul 08/20/2019 2:38 PM      Symptoms of diarrhea, fatigue, loss of taste and smell for 3-4 days.

## 2019-08-22 ENCOUNTER — Encounter (HOSPITAL_COMMUNITY): Payer: Self-pay

## 2019-08-22 ENCOUNTER — Ambulatory Visit (HOSPITAL_COMMUNITY)
Admission: RE | Admit: 2019-08-22 | Discharge: 2019-08-22 | Disposition: A | Discharge status: Home / Self Care | Payer: Medicare Other | Source: Ambulatory Visit | Attending: Pulmonary Disease | Admitting: Pulmonary Disease

## 2019-08-22 DIAGNOSIS — U071 COVID-19: Secondary | ICD-10-CM | POA: Diagnosis not present

## 2019-08-22 DIAGNOSIS — Z23 Encounter for immunization: Secondary | ICD-10-CM | POA: Insufficient documentation

## 2019-08-22 MED ORDER — ALBUTEROL SULFATE HFA 108 (90 BASE) MCG/ACT IN AERS: 2.0000 | INHALATION_SPRAY | Freq: Once | RESPIRATORY_TRACT | Status: DC | PRN

## 2019-08-22 MED ORDER — DIPHENHYDRAMINE HCL 50 MG/ML IJ SOLN: 50.0000 mg | Freq: Once | INTRAMUSCULAR | Status: DC | PRN

## 2019-08-22 MED ORDER — EPINEPHRINE 0.3 MG/0.3ML IJ SOAJ: 0.3000 mg | Freq: Once | INTRAMUSCULAR | Status: DC | PRN

## 2019-08-22 MED ORDER — SODIUM CHLORIDE 0.9 % IV SOLN
700.0000 mg | Freq: Once | INTRAVENOUS | Status: AC
  Administered 2019-08-22: 700 mg via INTRAVENOUS
  Filled 2019-08-22: qty 20

## 2019-08-22 MED ORDER — FAMOTIDINE IN NACL 20-0.9 MG/50ML-% IV SOLN: 20.0000 mg | Freq: Once | INTRAVENOUS | Status: DC | PRN

## 2019-08-22 MED ORDER — METHYLPREDNISOLONE SODIUM SUCC 125 MG IJ SOLR: 125.0000 mg | Freq: Once | INTRAMUSCULAR | Status: DC | PRN

## 2019-08-22 MED ORDER — SODIUM CHLORIDE 0.9 % IV SOLN
INTRAVENOUS | Status: DC | PRN
  Administered 2019-08-22: 14:00:00 250 mL via INTRAVENOUS

## 2019-08-22 NOTE — Progress Notes (Signed)
  Diagnosis: COVID-19  Physician:  Procedure: [facility] Med: bamlanivimab infusion - Provided patient with bamlanimivab fact sheet for patients, parents and caregivers prior to infusion.  Complications: No immediate complications noted.  Discharge: Discharged home   Sharon Paul [name] 08/22/2019

## 2019-08-22 NOTE — Discharge Instructions (Signed)
Prevent the Spread of COVID-19 if You Are Sick If you are sick with COVID-19 or think you might have COVID-19, follow the steps below to help protect other people in your home and community. Stay home except to get medical care.  Stay home. Most people with COVID-19 have mild illness and are able to recover at home without medical care. Do not leave your home, except to get medical care. Do not visit public areas.  Take care of yourself. Get rest and stay hydrated.  Get medical care when needed. Call your doctor before you go to their office for care. But, if you have trouble breathing or other concerning symptoms, call 911 for immediate help.  Avoid public transportation, ride-sharing, or taxis. Separate yourself from other people and pets in your home.  As much as possible, stay in a specific room and away from other people and pets in your home. Also, you should use a separate bathroom, if available. If you need to be around other people or animals in or outside of the home, wear a cloth face covering. ? See COVID-19 and Animals if you have questions about pets: https://www.cdc.gov/coronavirus/2019-ncov/faq.html#COVID19animals Monitor your symptoms.  Common symptoms of COVID-19 include fever and cough. Trouble breathing is a more serious symptom that means you should get medical attention.  Follow care instructions from your healthcare provider and local health department. Your local health authorities will give instructions on checking your symptoms and reporting information. If you develop emergency warning signs for COVID-19 get medical attention immediately.  Emergency warning signs include*:  Trouble breathing  Persistent pain or pressure in the chest  New confusion or not able to be woken  Bluish lips or face *This list is not all inclusive. Please consult your medical provider for any other symptoms that are severe or concerning to you. Call 911 if you have a medical  emergency. If you have a medical emergency and need to call 911, notify the operator that you have or think you might have, COVID-19. If possible, put on a facemask before medical help arrives. Call ahead before visiting your doctor.  Call ahead. Many medical visits for routine care are being postponed or done by phone or telemedicine.  If you have a medical appointment that cannot be postponed, call your doctor's office. This will help the office protect themselves and other patients. If you are sick, wear a cloth covering over your nose and mouth.  You should wear a cloth face covering over your nose and mouth if you must be around other people or animals, including pets (even at home).  You don't need to wear the cloth face covering if you are alone. If you can't put on a cloth face covering (because of trouble breathing for example), cover your coughs and sneezes in some other way. Try to stay at least 6 feet away from other people. This will help protect the people around you. Note: During the COVID-19 pandemic, medical grade facemasks are reserved for healthcare workers and some first responders. You may need to make a cloth face covering using a scarf or bandana. Cover your coughs and sneezes.  Cover your mouth and nose with a tissue when you cough or sneeze.  Throw used tissues in a lined trash can.  Immediately wash your hands with soap and water for at least 20 seconds. If soap and water are not available, clean your hands with an alcohol-based hand sanitizer that contains at least 60% alcohol. Clean your hands often.    Wash your hands often with soap and water for at least 20 seconds. This is especially important after blowing your nose, coughing, or sneezing; going to the bathroom; and before eating or preparing food.  Use hand sanitizer if soap and water are not available. Use an alcohol-based hand sanitizer with at least 60% alcohol, covering all surfaces of your hands and rubbing  them together until they feel dry.  Soap and water are the best option, especially if your hands are visibly dirty.  Avoid touching your eyes, nose, and mouth with unwashed hands. Avoid sharing personal household items.  Do not share dishes, drinking glasses, cups, eating utensils, towels, or bedding with other people in your home.  Wash these items thoroughly after using them with soap and water or put them in the dishwasher. Clean all "high-touch" surfaces everyday.  Clean and disinfect high-touch surfaces in your "sick room" and bathroom. Let someone else clean and disinfect surfaces in common areas, but not your bedroom and bathroom.  If a caregiver or other person needs to clean and disinfect a sick person's bedroom or bathroom, they should do so on an as-needed basis. The caregiver/other person should wear a mask and wait as long as possible after the sick person has used the bathroom. High-touch surfaces include phones, remote controls, counters, tabletops, doorknobs, bathroom fixtures, toilets, keyboards, tablets, and bedside tables.  Clean and disinfect areas that may have blood, stool, or body fluids on them.  Use household cleaners and disinfectants. Clean the area or item with soap and water or another detergent if it is dirty. Then use a household disinfectant. ? Be sure to follow the instructions on the label to ensure safe and effective use of the product. Many products recommend keeping the surface wet for several minutes to ensure germs are killed. Many also recommend precautions such as wearing gloves and making sure you have good ventilation during use of the product. ? Most EPA-registered household disinfectants should be effective. How to discontinue home isolation  People with COVID-19 who have stayed home (home isolated) can stop home isolation under the following conditions: ? If you will not have a test to determine if you are still contagious, you can leave home  after these three things have happened:  You have had no fever for at least 72 hours (that is three full days of no fever without the use of medicine that reduces fevers) AND  other symptoms have improved (for example, when your cough or shortness of breath has improved) AND  at least 10 days have passed since your symptoms first appeared. ? If you will be tested to determine if you are still contagious, you can leave home after these three things have happened:  You no longer have a fever (without the use of medicine that reduces fevers) AND  other symptoms have improved (for example, when your cough or shortness of breath has improved) AND  you received two negative tests in a row, 24 hours apart. Your doctor will follow CDC guidelines. In all cases, follow the guidance of your healthcare provider and local health department. The decision to stop home isolation should be made in consultation with your healthcare provider and state and local health departments. Local decisions depend on local circumstances. cdc.gov/coronavirus 12/30/2018 This information is not intended to replace advice given to you by your health care provider. Make sure you discuss any questions you have with your health care provider. Document Released: 12/11/2018 Document Revised: 01/09/2019 Document Reviewed: 12/11/2018   Elsevier Patient Education  2020 [employer]  

## 2019-08-22 NOTE — Progress Notes (Signed)
  Diagnosis: COVID-19  Physician: Dr. Joya Gaskins  Procedure: [facility] Med: bamlanivimab infusion - Provided patient with bamlanimivab fact sheet for patients, parents and caregivers prior to infusion.  Complications: No immediate complications noted.  Discharge: Discharged home   Janine Ores 08/22/2019

## 2019-08-26 ENCOUNTER — Ambulatory Visit: Payer: Self-pay | Admitting: *Deleted

## 2019-08-26 NOTE — Telephone Encounter (Signed)
She is COVID-19 positive.    She is on day 9 of the virus.    She is requesting something stronger for the cough in the evenings and the non stop diarrhea after she eats.   She has tried the OTC medications for both issues without relief.   Would Dr. Alain Marion be willing to order me something stronger? She was agreeable to me sending a high priority note.  I sent a high priority note to Dr. Judeen Hammans office with her 2 requests.  She has also sent a MyChart message but has not heard anything.  See triage notes.  Reason for Disposition . [1] COVID-19 infection suspected by caller or triager AND [2] mild symptoms (cough, fever, or others) AND 99991111 no complications or SOB    Having diarrhea that OTC medications are not helping. Coughing in the evenings that OTC medications are not helping. She is requesting something stronger for these 2 issues.  Answer Assessment - Initial Assessment Questions 1. COVID-19 DIAGNOSIS: "Who made your Coronavirus (COVID-19) diagnosis?" "Was it confirmed by a positive lab test?" If not diagnosed by a HCP, ask "Are there lots of cases (community spread) where you live?" (See public health department website, if unsure)     I have the COVID-19 this is the 9th day.    I'm having diarrhea.   I've taken Imodium and Pepto Bismal.    I've had the diarrhea for a week now.    I need something stronger for the diarrhea. I'm drinking a lot of fluids and keeping them down.   Every time I eat I have to go to the bathroom. Sometimes at night I'm having problems with coughing.    Can the doctor order me something for the cough?   The Robitussin is not helping.   2. COVID-19 EXPOSURE: "Was there any known exposure to COVID before the symptoms began?" CDC Definition of close contact: within 6 feet (2 meters) for a total of 15 minutes or more over a 24-hour period.      The cough has lasted almost the whole time in the evening.   It's worse in the evening. 3. ONSET: "When did the  COVID-19 symptoms start?"      I've been very tired and no appetite.   I make myself eat but then I have diarrhea.    4. WORST SYMPTOM: "What is your worst symptom?" (e.g., cough, fever, shortness of breath, muscle aches)     The diarrhea 5. COUGH: "Do you have a cough?" If so, ask: "How bad is the cough?"       Yes in the evenings. 6. FEVER: "Do you have a fever?" If so, ask: "What is your temperature, how was it measured, and when did it start?"     No fever 7. RESPIRATORY STATUS: "Describe your breathing?" (e.g., shortness of breath, wheezing, unable to speak)      When I'm coughing a lot it bothers me. 8. BETTER-SAME-WORSE: "Are you getting better, staying the same or getting worse compared to yesterday?"  If getting worse, ask, "In what way?"     Worse the diarrhea 9. HIGH RISK DISEASE: "Do you have any chronic medical problems?" (e.g., asthma, heart or lung disease, weak immune system, obesity, etc.)     I have high BP    10. PREGNANCY: "Is there any chance you are pregnant?" "When was your last menstrual period?"       N/A due to age 76. OTHER SYMPTOMS: "Do you have  any other symptoms?"  (e.g., chills, fatigue, headache, loss of smell or taste, muscle pain, sore throat; new loss of smell or taste especially support the diagnosis of COVID-19)       At beginning no smell but now it's back.  Protocols used: CORONAVIRUS (COVID-19) DIAGNOSED OR SUSPECTED-A-AH

## 2019-08-27 MED ORDER — HYDROCODONE-HOMATROPINE 5-1.5 MG/5ML PO SYRP: 5.0000 mL | ORAL_SOLUTION | Freq: Three times a day (TID) | ORAL | 0 refills | Status: DC | PRN

## 2019-08-27 MED ORDER — DIPHENOXYLATE-ATROPINE 2.5-0.025 MG PO TABS: 1.0000 | ORAL_TABLET | Freq: Four times a day (QID) | ORAL | 0 refills | Status: DC | PRN

## 2019-08-27 NOTE — Addendum Note (Signed)
Addended by: Cassandria Anger on: 08/27/2019 07:21 AM   Modules accepted: Orders

## 2019-08-27 NOTE — Telephone Encounter (Signed)
Notified pt w/Md response.../lmb 

## 2019-08-27 NOTE — Telephone Encounter (Addendum)
Ok Hycodan (reduce or stop if nausea), Lomotil - done. Feel better! AP

## 2019-09-06 ENCOUNTER — Other Ambulatory Visit: Payer: Self-pay

## 2019-09-06 ENCOUNTER — Ambulatory Visit (INDEPENDENT_AMBULATORY_CARE_PROVIDER_SITE_OTHER): Payer: Medicare Other | Admitting: Internal Medicine

## 2019-09-06 DIAGNOSIS — R197 Diarrhea, unspecified: Secondary | ICD-10-CM

## 2019-09-06 DIAGNOSIS — U071 COVID-19: Secondary | ICD-10-CM

## 2019-09-06 DIAGNOSIS — R059 Cough, unspecified: Secondary | ICD-10-CM

## 2019-09-06 DIAGNOSIS — R05 Cough: Secondary | ICD-10-CM

## 2019-09-06 DIAGNOSIS — R0600 Dyspnea, unspecified: Secondary | ICD-10-CM

## 2019-09-06 MED ORDER — DIPHENOXYLATE-ATROPINE 2.5-0.025 MG PO TABS: 1.0000 | ORAL_TABLET | Freq: Four times a day (QID) | ORAL | 0 refills | Status: DC | PRN

## 2019-09-06 MED ORDER — ALBUTEROL SULFATE HFA 108 (90 BASE) MCG/ACT IN AERS: 2.0000 | INHALATION_SPRAY | Freq: Four times a day (QID) | RESPIRATORY_TRACT | 0 refills | Status: DC | PRN

## 2019-09-06 MED ORDER — DICYCLOMINE HCL 10 MG PO CAPS: 10.0000 mg | ORAL_CAPSULE | Freq: Three times a day (TID) | ORAL | 0 refills | Status: DC

## 2019-09-06 NOTE — Progress Notes (Signed)
Patient ID: Sharon Paul, female   DOB: Feb 14, 1946, 74 y.o.   MRN: [id number]  Virtual Visit via Video Note  I connected with Sharon Paul on 09/06/19 at  4:20 PM EST by a video enabled telemedicine application and verified that I am speaking with the correct person using two identifiers.  Location: Patient: at home Provider: at office  I discussed the limitations of evaluation and management by telemedicine and the availability of in person appointments. The patient expressed understanding and agreed to proceed.  History of Present Illness: Here with c/o recent COVID infection 12/19 for which she received monoclonal Ab and felt immediately some improved in next few days.  But now with persistent non prod cough that never resolved, worse at night, better with robitussin DM, but still assoc with mild sob and cant take full deep breath.  Pt denies chest pain, orthopnea, PND, increased LE swelling, palpitations, dizziness or syncope. Also with persistent episodes of watery diarrhea 2-3 times per day without blood, fever, n/v or worsening abd pain, but does have some lower appetite despite immoidium AD and peptobismol, also with sweats, feeling warm, blurry vision off an on but lorazepam seemed to help.  Denies worsening reflux, dysphagia.  No other new complaints Past Medical History:  Diagnosis Date  . Celiac sprue 2007  . CVD (cardiovascular disease)   . Foot fracture, left 2011  . GERD (gastroesophageal reflux disease)   . Hyperlipidemia   . Osteopenia 04/2019   T score -1.9 FRAX 10% / 1.9%.  Stable from prior DEXA  . PVD (peripheral vascular disease) (HCC)    mild carotid ? fibromuscular dysplasia  . Shingles   . Urinary incontinence    Past Surgical History:  Procedure Laterality Date  . APPENDECTOMY    . BREAST SURGERY     right breast biopsy  . ESOPHAGOGASTRODUODENOSCOPY    . ROTATOR CUFF REPAIR     RIGHT  . stress cardiolite  04-18-00  . 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 She has never used smokeless tobacco. She reports current alcohol use. She reports that she does not use drugs. family history includes Glaucoma in her brother and mother; Heart disease in her father and mother; Hypertension in her brother, father, mother, paternal grandmother, and sister; Lung cancer in her paternal grandmother; Stroke in her father. Allergies  Allergen Reactions  . Amlodipine     HA  . Augmentin [Amoxicillin-Pot Clavulanate]     diarrhea  . Clarithromycin     REACTION: gi upset. Can take Zpac  . Codeine Nausea And Vomiting    Can take Prom-cod syr  . Esomeprazole Magnesium     REACTION: side pain  . Hydrochlorothiazide     REACTION: leg cramps, dehydration  . Iohexol      Desc: PT HAS SWELLING TO FACE,LIPS AND THROAT WITH CONTRAST DYE   . Kapidex [Dexlansoprazole]     rash  . Losartan Potassium     REACTION: HA  . Olmesartan Medoxomil     REACTION: weak legs  . Ramipril     Per pt: unknown reaction  . Risedronate Sodium     Per pt: unknown reaction   Current Outpatient Medications on File Prior to Visit  Medication Sig Dispense Refill  . acetaminophen (TYLENOL) 500 MG tablet Take 500 mg by mouth every 6 (six) hours as needed.    [name] aspirin 81 MG tablet Take 81 mg by  mouth daily.    [name] azelastine (ASTELIN) 0.1 % nasal spray Place 2 sprays into both nostrils 2 (two) times daily. 30 mL 0  . Cholecalciferol 2000 UNITS TABS Take 1 tablet by mouth daily.    [name] FLUoxetine (PROZAC) 10 MG capsule Take 1 capsule (10 mg total) by mouth daily. 90 capsule 1  . fluticasone (FLONASE) 50 MCG/ACT nasal spray PLACE 2 SPRAYS INTO THE NOSE DAILY. 48 g 3  . ibuprofen (ADVIL,MOTRIN) 800 MG tablet Take 1 tablet (800 mg total) by mouth every 8 (eight) hours as needed. 60 tablet 1  . irbesartan (AVAPRO) 300 MG tablet Take 0.5 tablets (150 mg total) by mouth 2 (two) times daily. 90 tablet 3  . labetalol  (NORMODYNE) 300 MG tablet Take 1 tablet (300 mg total) by mouth 2 (two) times daily. 180 tablet 3  . LORazepam (ATIVAN) 0.5 MG tablet TAKE 1 TABLET BY MOUTH TWICE A DAY (Patient taking differently: 0.5 mg as needed. ) 60 tablet 3  . pantoprazole (PROTONIX) 40 MG tablet Take 1 tablet (40 mg total) by mouth daily. Office visit needed before refills will be given 90 tablet 0  . rosuvastatin (CRESTOR) 10 MG tablet Take 1 tablet (10 mg total) by mouth every other day. 45 tablet 3  . valACYclovir (VALTREX) 1000 MG tablet Take 2 tablets at the earliest onset of cold sore symptoms.  Repeat with 2 tablets 12 hours later 12 tablet 1  . HYDROcodone-homatropine (HYCODAN) 5-1.5 MG/5ML syrup Take 5 mLs by mouth every 8 (eight) hours as needed for cough. (Patient not taking: Reported on 09/06/2019) 240 mL 0  . meloxicam (MOBIC) 7.5 MG tablet TAKE 1 TABLET BY MOUTH TWICE A DAY AS NEEDED FOR PAIN (Patient not taking: Reported on 09/06/2019) 60 tablet 2  . NONFORMULARY OR COMPOUNDED ITEM Estradiol [id number] prefilled applicators Insert one appful vaginally twice weekly. (Patient not taking: Reported on 09/06/2019) 24 each 0   No current facility-administered medications on file prior to visit.    Observations/Objective: Alert, NAD, appropriate mood and affect, resps normal, cn 2-12 intact, moves all 4s, no visible rash or swelling Lab Results  Component Value Date   WBC 4.0 02/21/2018   HGB 12.1 02/21/2018   HCT 35.7 (L) 02/21/2018   PLT 245.0 02/21/2018   GLUCOSE 97 10/25/2018   CHOL 176 10/25/2018   TRIG 132.0 10/25/2018   HDL 62.20 10/25/2018   LDLDIRECT 138.2 10/22/2010   LDLCALC 87 10/25/2018   ALT 16 10/25/2018   AST 17 10/25/2018   NA 139 10/25/2018   K 4.2 10/25/2018   CL 103 10/25/2018   CREATININE 0.82 10/25/2018   BUN 12 10/25/2018   CO2 28 10/25/2018   TSH 1.69 10/25/2018   HGBA1C 6.1 03/15/2017   Assessment and Plan: See notes  Follow Up Instructions: See notes   I discussed the  assessment and treatment plan with the patient. The patient was provided an opportunity to ask questions and all were answered. The patient agreed with the plan and demonstrated an understanding of the instructions.   The patient was advised to call back or seek an in-person evaluation if the symptoms worsen or if the condition fails to improve as anticipated.  Cathlean Cower, MD

## 2019-09-06 NOTE — Patient Instructions (Signed)
Please take all new medication as prescribed - the GI medications and the inhaler  Please go to UC if not improving  Please continue all other medications as before, and refills have been done if requested.  Please have the pharmacy call with any other refills you may need.  Please keep your appointments with your specialists as you may have planned

## 2019-09-08 ENCOUNTER — Encounter: Payer: Self-pay | Admitting: Internal Medicine

## 2019-09-08 DIAGNOSIS — U071 COVID-19: Secondary | ICD-10-CM | POA: Insufficient documentation

## 2019-09-08 DIAGNOSIS — R0600 Dyspnea, unspecified: Secondary | ICD-10-CM | POA: Insufficient documentation

## 2019-09-08 NOTE — Assessment & Plan Note (Signed)
Ok for albuterol HFA prn,  to f/u any worsening symptoms or concerns

## 2019-09-08 NOTE — Assessment & Plan Note (Signed)
Likely post covid related, ok for dicyclomine prn, lomotil prn and consider UC if getting worse

## 2019-09-08 NOTE — Assessment & Plan Note (Signed)
Improved,  to f/u any worsening symptoms or concerns 

## 2019-09-08 NOTE — Assessment & Plan Note (Signed)
Ok for contd robitussin DM,  to f/u any worsening symptoms or concerns

## 2019-09-09 ENCOUNTER — Ambulatory Visit (INDEPENDENT_AMBULATORY_CARE_PROVIDER_SITE_OTHER): Payer: Medicare Other | Admitting: Family Medicine

## 2019-09-09 VITALS — BP 152/84 | HR 57 | Temp 98.2°F | Ht 59.0 in

## 2019-09-09 DIAGNOSIS — Z8616 Personal history of COVID-19: Secondary | ICD-10-CM | POA: Diagnosis not present

## 2019-09-09 DIAGNOSIS — I10 Essential (primary) hypertension: Secondary | ICD-10-CM | POA: Diagnosis not present

## 2019-09-09 DIAGNOSIS — U071 COVID-19: Secondary | ICD-10-CM

## 2019-09-09 DIAGNOSIS — R0602 Shortness of breath: Secondary | ICD-10-CM

## 2019-09-09 NOTE — Progress Notes (Signed)
Patient ID: Sharon Paul, female    DOB: [birth year], 74 y.o.   MRN: [id number]  PCP: Cassandria Anger, MD  Chief Complaint  Patient presents with  . covid follow-up    Subjective:  HPI  Sharon Paul is a 74 y.o. female presents for evaluation of cough and shortness of breath related to COVID-19 infection.     Sharon Paul seen today in  [facility] Respiratory clinic today for further evaluation of COVID-19 related symptoms. As of last week, patient was still experienced GI symptoms, dyspnea and persistent cough. Diagnosed with COVID-19 on 08/17/19 and she was symptomatic with respiratory symptoms during time of diagnosis. Patient completed a virtual visit with PCP last week and prescribed albuterol and medication for GI symptoms. Today she reports the symptoms have all resolved. She kept appointment today to receive an in person evaluation. She has not experienced a fever. She is tolerating foods and fluids. No complaints today. Social History   Socioeconomic History  . Marital status: Married    Spouse name: Theotis Burrow  . Number of children: 2  . Years of education: Not on file  . Highest education level: Not on file  Occupational History  . Occupation: semi-retired  Tobacco Use  . Smoking status: Former Smoker    Quit date: 02/11/1970    Years since quitting: 49.6  . Smokeless tobacco: Never Used  Substance and Sexual Activity  . Alcohol use: Yes    Alcohol/week: 0.0 standard drinks    Comment: very rare  . Drug use: No  . Sexual activity: Yes    Birth control/protection: Surgical, Post-menopausal    Comment: 1st intercourse 74 yo-Fewer than 5 partners,des neg  Other Topics Concern  . Not on file  Social History Narrative  . Not on file   Social Determinants of Health   Financial Resource Strain:   . Difficulty of Paying Living Expenses: Not on file  Food Insecurity:   . Worried About Charity fundraiser in the Last Year: Not on file   . Ran Out of Food in the Last Year: Not on file  Transportation Needs:   . Lack of Transportation (Medical): Not on file  . Lack of Transportation (Non-Medical): Not on file  Physical Activity: Insufficiently Active  . Days of Exercise per Week: 3 days  . Minutes of Exercise per Session: 30 min  Stress:   . Feeling of Stress : Not on file  Social Connections:   . Frequency of Communication with Friends and Family: Not on file  . Frequency of Social Gatherings with Friends and Family: Not on file  . Attends Religious Services: Not on file  . Active Member of Clubs or Organizations: Not on file  . Attends Archivist Meetings: Not on file  . Marital Status: Not on file  Intimate Partner Violence:   . Fear of Current or Ex-Partner: Not on file  . Emotionally Abused: Not on file  . Physically Abused: Not on file  . Sexually Abused: Not on file    Family History  Problem Relation Age of Onset  . Glaucoma Mother   . Heart disease Mother   . Hypertension Mother   . Heart disease Father   . Hypertension Father   . Stroke Father   . Hypertension Paternal Grandmother   . Lung cancer Paternal Grandmother   . Hypertension Sister   . Hypertension Brother   . Glaucoma Brother   . Colon cancer Neg Hx    Review  of Systems Pertinent negatives listed in HPI Allergies  Allergen Reactions  . Amlodipine     HA  . Augmentin [Amoxicillin-Pot Clavulanate]     diarrhea  . Clarithromycin     REACTION: gi upset. Can take Zpac  . Codeine Nausea And Vomiting    Can take Prom-cod syr  . Esomeprazole Magnesium     REACTION: side pain  . Hydrochlorothiazide     REACTION: leg cramps, dehydration  . Iohexol      Desc: PT HAS SWELLING TO FACE,LIPS AND THROAT WITH CONTRAST DYE   . Kapidex [Dexlansoprazole]     rash  . Losartan Potassium     REACTION: HA  . Olmesartan Medoxomil     REACTION: weak legs  . Ramipril     Per pt: unknown reaction  . Risedronate Sodium     Per pt:  unknown reaction    Prior to Admission medications   Medication Sig Start Date End Date Taking? Authorizing Provider  acetaminophen (TYLENOL) 500 MG tablet Take 500 mg by mouth every 6 (six) hours as needed.   Yes [provider]  albuterol (VENTOLIN HFA) 108 (90 Base) MCG/ACT inhaler Inhale 2 puffs into the lungs every 6 (six) hours as needed for wheezing or shortness of breath. 09/06/19  Yes Biagio Borg, MD  aspirin 81 MG tablet Take 81 mg by mouth daily.   Yes [provider]  azelastine (ASTELIN) 0.1 % nasal spray Place 2 sprays into both nostrils 2 (two) times daily. 08/17/19  Yes Yu, Amy V, PA-C  Cholecalciferol 2000 UNITS TABS Take 1 tablet by mouth daily.   Yes [provider]  dicyclomine (BENTYL) 10 MG capsule Take 1 capsule (10 mg total) by mouth 4 (four) times daily -  before meals and at bedtime. 09/06/19  Yes Biagio Borg, MD  diphenoxylate-atropine (LOMOTIL) 2.5-0.025 MG tablet Take 1-2 tablets by mouth 4 (four) times daily as needed for diarrhea or loose stools. 09/06/19  Yes Biagio Borg, MD  FLUoxetine (PROZAC) 10 MG capsule Take 1 capsule (10 mg total) by mouth daily. 03/04/19  Yes Plotnikov, Evie Lacks, MD  fluticasone (FLONASE) 50 MCG/ACT nasal spray PLACE 2 SPRAYS INTO THE NOSE DAILY. 03/08/19  Yes Plotnikov, Evie Lacks, MD  HYDROcodone-homatropine (HYCODAN) 5-1.5 MG/5ML syrup Take 5 mLs by mouth every 8 (eight) hours as needed for cough. 08/27/19  Yes Plotnikov, Evie Lacks, MD  ibuprofen (ADVIL,MOTRIN) 800 MG tablet Take 1 tablet (800 mg total) by mouth every 8 (eight) hours as needed. 03/28/18  Yes Fontaine, Belinda Block, MD  irbesartan (AVAPRO) 300 MG tablet Take 0.5 tablets (150 mg total) by mouth 2 (two) times daily. 12/11/18  Yes Plotnikov, Evie Lacks, MD  labetalol (NORMODYNE) 300 MG tablet Take 1 tablet (300 mg total) by mouth 2 (two) times daily. 12/11/18  Yes Plotnikov, Evie Lacks, MD  LORazepam (ATIVAN) 0.5 MG tablet TAKE 1 TABLET BY MOUTH TWICE A  DAY Patient taking differently: 0.5 mg as needed.  06/29/16  Yes Plotnikov, Evie Lacks, MD  meloxicam (MOBIC) 7.5 MG tablet TAKE 1 TABLET BY MOUTH TWICE A DAY AS NEEDED FOR PAIN 03/25/19  Yes Plotnikov, Evie Lacks, MD  NONFORMULARY OR COMPOUNDED ITEM Estradiol [id number] prefilled applicators Insert one appful vaginally twice weekly. 03/05/19  Yes Fontaine, Belinda Block, MD  pantoprazole (PROTONIX) 40 MG tablet Take 1 tablet (40 mg total) by mouth daily. Office visit needed before refills will be given 06/21/19  Yes Plotnikov, Evie Lacks, MD  rosuvastatin (  CRESTOR) 10 MG tablet Take 1 tablet (10 mg total) by mouth every other day. 05/21/19  Yes Plotnikov, Evie Lacks, MD  valACYclovir (VALTREX) 1000 MG tablet Take 2 tablets at the earliest onset of cold sore symptoms.  Repeat with 2 tablets 12 hours later 04/10/19  Yes Fontaine, Belinda Block, MD  diazepam (VALIUM) 5 MG tablet Take 5 mg by mouth 2 (two) times daily as needed. 07/03/19   [provider]    Past Medical, Surgical Family and Social History reviewed and updated.    Objective:   Today's Vitals   09/09/19 1813  BP: (!) 180/90  Pulse: (!) 57  Temp: 98.2 F (36.8 C)  SpO2: 98%  Height: 4\' 11"  (1.499 m)    BP Readings from Last 3 Encounters:  09/09/19 (!) 180/90  08/22/19 121/71  08/17/19 (!) 166/72    There were no vitals filed for this visit.     Physical Exam General appearance: alert, well developed, well nourished, cooperative and in no distress Head: Normocephalic, without obvious abnormality, atraumatic Respiratory: Respirations even and unlabored, normal respiratory rate Heart: rate and rhythm normal. No gallop or murmurs noted on exam  Extremities: No gross deformities Skin: Skin color, texture, turgor normal. No rashes seen  Psych: Appropriate mood and affect. Neurologic: Mental status: Alert, oriented to person, place, and time, thought content appropriate.  Lab Results  Component Value Date   POCGLU 125 (A)  06/30/2018    Lab Results  Component Value Date   HGBA1C 6.1 03/15/2017            Assessment & Plan:  1. Shortness of breath, resolved with albuterol. 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  2. COVID-19, approaching 4 weeks. Quarantine period completed.  3. Elevated blood pressure reading with diagnosis of hypertension, repeated BP prior to discharge and remained elevated, although improved. Encouraged to take evening dose of labetalol as soon as she arrives home and monitor BP at home. If readings remain elevated, follow-up with PCP.      Molli Barrows, FNP-C [facility], PRN Provider  [facility]. [place], [place] Clinic Phone: [phone] Clinic Fax: [phone] Clinic Hours: 5:30 pm -7:30 pm (Monday, Wednesday, and Friday)

## 2019-09-09 NOTE — Patient Instructions (Signed)
Shortness of Breath, Adult Shortness of breath means you have trouble breathing. Shortness of breath could be a sign of a medical problem. Follow these instructions at home:   Watch for any changes in your symptoms.  Do not use any products that contain nicotine or tobacco, such as cigarettes, e-cigarettes, and chewing tobacco.  Do not smoke. Smoking can cause shortness of breath. If you need help to quit smoking, ask your doctor.  Avoid things that can make it harder to breathe, such as: ? Mold. ? Dust. ? Air pollution. ? Chemical smells. ? Things that can cause allergy symptoms (allergens), if you have allergies.  Keep your living space clean. Use products that help remove mold and dust.  Rest as needed. Slowly return to your normal activities.  Take over-the-counter and prescription medicines only as told by your doctor. This includes oxygen therapy and inhaled medicines.  Keep all follow-up visits as told by your doctor. This is important. Contact a doctor if:  Your condition does not get better as soon as expected.  You have a hard time doing your normal activities, even after you rest.  You have new symptoms. Get help right away if:  Your shortness of breath gets worse.  You have trouble breathing when you are resting.  You feel light-headed or you pass out (faint).  You have a cough that is not helped by medicines.  You cough up blood.  You have pain with breathing.  You have pain in your chest, arms, shoulders, or belly (abdomen).  You have a fever.  You cannot walk up stairs.  You cannot exercise the way you normally do. These symptoms may represent a serious problem that is an emergency. Do not wait to see if the symptoms will go away. Get medical help right away. Call your local emergency services (911 in the [place] Do not drive yourself to the hospital. Summary  Shortness of breath is when you have trouble breathing enough air. It can be a sign of a  medical problem.  Avoid things that make it hard for you to breathe, such as smoking, pollution, mold, and dust.  Watch for any changes in your symptoms. Contact your doctor if you do not get better or you get worse. This information is not intended to replace advice given to you by your health care provider. Make sure you discuss any questions you have with your health care provider. Document Revised: 01/15/2018 Document Reviewed: 01/15/2018 Elsevier Patient Education  2020 Elsevier Inc.  

## 2019-09-12 ENCOUNTER — Other Ambulatory Visit: Payer: Self-pay | Admitting: Internal Medicine

## 2019-09-25 ENCOUNTER — Other Ambulatory Visit: Payer: Self-pay | Admitting: Internal Medicine

## 2019-09-26 ENCOUNTER — Telehealth: Payer: Self-pay

## 2019-09-26 NOTE — Telephone Encounter (Signed)
New message   The patient has questions regarding COVID vaccine.

## 2019-09-27 ENCOUNTER — Other Ambulatory Visit: Payer: Self-pay | Admitting: Internal Medicine

## 2019-09-30 NOTE — Telephone Encounter (Signed)
Pt notified to wait 6 months between her positive result and her vaccine

## 2019-10-09 ENCOUNTER — Other Ambulatory Visit: Payer: Self-pay

## 2019-10-09 ENCOUNTER — Encounter: Payer: Self-pay | Admitting: Internal Medicine

## 2019-10-09 ENCOUNTER — Ambulatory Visit (INDEPENDENT_AMBULATORY_CARE_PROVIDER_SITE_OTHER): Payer: Medicare Other | Admitting: Internal Medicine

## 2019-10-09 VITALS — BP 140/78 | HR 64 | Temp 98.0°F | Ht 59.0 in | Wt 123.0 lb

## 2019-10-09 DIAGNOSIS — Z8616 Personal history of COVID-19: Secondary | ICD-10-CM

## 2019-10-09 DIAGNOSIS — I10 Essential (primary) hypertension: Secondary | ICD-10-CM

## 2019-10-09 DIAGNOSIS — E785 Hyperlipidemia, unspecified: Secondary | ICD-10-CM

## 2019-10-09 DIAGNOSIS — Z0000 Encounter for general adult medical examination without abnormal findings: Secondary | ICD-10-CM

## 2019-10-09 DIAGNOSIS — J019 Acute sinusitis, unspecified: Secondary | ICD-10-CM | POA: Diagnosis not present

## 2019-10-09 DIAGNOSIS — E559 Vitamin D deficiency, unspecified: Secondary | ICD-10-CM

## 2019-10-09 DIAGNOSIS — Z0001 Encounter for general adult medical examination with abnormal findings: Secondary | ICD-10-CM | POA: Diagnosis not present

## 2019-10-09 DIAGNOSIS — F411 Generalized anxiety disorder: Secondary | ICD-10-CM

## 2019-10-09 MED ORDER — FLUCONAZOLE 150 MG PO TABS: 150.0000 mg | ORAL_TABLET | Freq: Once | ORAL | 1 refills | Status: AC

## 2019-10-09 MED ORDER — CEFDINIR 300 MG PO CAPS: 300.0000 mg | ORAL_CAPSULE | Freq: Two times a day (BID) | ORAL | 0 refills | Status: DC

## 2019-10-09 MED ORDER — LABETALOL HCL 300 MG PO TABS: 300.0000 mg | ORAL_TABLET | Freq: Two times a day (BID) | ORAL | 3 refills | Status: DC

## 2019-10-09 MED ORDER — DICYCLOMINE HCL 10 MG PO CAPS: 10.0000 mg | ORAL_CAPSULE | Freq: Three times a day (TID) | ORAL | 0 refills | Status: DC

## 2019-10-09 NOTE — Assessment & Plan Note (Signed)
Worse post-covid19: discussed Lorazepam prn, Prozac

## 2019-10-09 NOTE — Assessment & Plan Note (Signed)
Post-COVID: Omnicef x 10 d and Diflucan prn

## 2019-10-09 NOTE — Progress Notes (Signed)
Subjective:  Patient ID: Sharon Paul, female    DOB: June 02, 1946  Age: 74 y.o. MRN: [id number]  CC: No chief complaint on file.   HPI Sharon Paul presents for a well exam C/o brown nasal d/c, blood Had COVID 19 in Dec 2020 - stressed about it now F/u HTN  Outpatient Medications Prior to Visit  Medication Sig Dispense Refill  . acetaminophen (TYLENOL) 500 MG tablet Take 500 mg by mouth every 6 (six) hours as needed.    [name] albuterol (VENTOLIN HFA) 108 (90 Base) MCG/ACT inhaler TAKE 2 PUFFS BY MOUTH EVERY 6 HOURS AS NEEDED FOR WHEEZE OR SHORTNESS OF BREATH 6.7 g 0  . aspirin 81 MG tablet Take 81 mg by mouth daily.    [name] azelastine (ASTELIN) 0.1 % nasal spray Place 2 sprays into both nostrils 2 (two) times daily. 30 mL 0  . Cholecalciferol 2000 UNITS TABS Take 1 tablet by mouth daily.    . diazepam (VALIUM) 5 MG tablet Take 5 mg by mouth 2 (two) times daily as needed.    . dicyclomine (BENTYL) 10 MG capsule Take 1 capsule (10 mg total) by mouth 4 (four) times daily -  before meals and at bedtime. 90 capsule 0  . diphenoxylate-atropine (LOMOTIL) 2.5-0.025 MG tablet Take 1-2 tablets by mouth 4 (four) times daily as needed for diarrhea or loose stools. 60 tablet 0  . FLUoxetine (PROZAC) 10 MG capsule Take 1 capsule (10 mg total) by mouth daily. Office visit needed before refills will be given 90 capsule 0  . fluticasone (FLONASE) 50 MCG/ACT nasal spray PLACE 2 SPRAYS INTO THE NOSE DAILY. 48 g 3  . HYDROcodone-homatropine (HYCODAN) 5-1.5 MG/5ML syrup Take 5 mLs by mouth every 8 (eight) hours as needed for cough. 240 mL 0  . ibuprofen (ADVIL,MOTRIN) 800 MG tablet Take 1 tablet (800 mg total) by mouth every 8 (eight) hours as needed. 60 tablet 1  . irbesartan (AVAPRO) 300 MG tablet Take 0.5 tablets (150 mg total) by mouth 2 (two) times daily. 90 tablet 3  . labetalol (NORMODYNE) 300 MG tablet Take 1 tablet (300 mg total) by mouth 2 (two) times daily. 180 tablet 3  . LORazepam  (ATIVAN) 0.5 MG tablet TAKE 1 TABLET BY MOUTH TWICE A DAY (Patient taking differently: 0.5 mg as needed. ) 60 tablet 3  . meloxicam (MOBIC) 7.5 MG tablet TAKE 1 TABLET BY MOUTH TWICE A DAY AS NEEDED FOR PAIN 60 tablet 2  . NONFORMULARY OR COMPOUNDED ITEM Estradiol [id number] prefilled applicators Insert one appful vaginally twice weekly. 24 each 0  . pantoprazole (PROTONIX) 40 MG tablet TAKE 1 TABLET BY MOUTH  DAILY 90 tablet 1  . rosuvastatin (CRESTOR) 10 MG tablet Take 1 tablet (10 mg total) by mouth every other day. 45 tablet 3  . valACYclovir (VALTREX) 1000 MG tablet Take 2 tablets at the earliest onset of cold sore symptoms.  Repeat with 2 tablets 12 hours later 12 tablet 1   No facility-administered medications prior to visit.    ROS: Review of Systems  Constitutional: Positive for fatigue. Negative for activity change, appetite change, chills and unexpected weight change.  HENT: Positive for congestion, nosebleeds, postnasal drip, rhinorrhea and sinus pressure. Negative for mouth sores.   Eyes: Negative for visual disturbance.  Respiratory: Negative for cough and chest tightness.   Gastrointestinal: Positive for abdominal pain. Negative for nausea.  Genitourinary: Negative for difficulty urinating, frequency and vaginal pain.  Musculoskeletal: Negative for back pain and gait problem.  Skin: Negative for pallor and rash.  Neurological: Negative for dizziness, tremors, weakness, numbness and headaches.  Psychiatric/Behavioral: Negative for confusion and sleep disturbance.    Objective:  BP 140/78 (BP Location: Left Arm, Patient Position: Sitting, Cuff Size: Normal)   Pulse 64   Temp 98 F (36.7 C) (Oral)   Ht 4\' 11"  (1.499 m)   Wt 123 lb (55.8 kg)   SpO2 99%   BMI 24.84 kg/m   BP Readings from Last 3 Encounters:  10/09/19 140/78  09/11/19 (!) 152/84  08/22/19 121/71    Wt Readings from Last 3 Encounters:  10/09/19 123 lb (55.8 kg)  06/06/19 128 lb (58.1 kg)  04/10/19 128  lb (58.1 kg)    Physical Exam Constitutional:      General: She is not in acute distress.    Appearance: She is well-developed.  HENT:     Head: Normocephalic.     Right Ear: External ear normal.     Left Ear: External ear normal.     Nose: Nose normal.  Eyes:     General:        Right eye: No discharge.        Left eye: No discharge.     Conjunctiva/sclera: Conjunctivae normal.     Pupils: Pupils are equal, round, and reactive to light.  Neck:     Thyroid: No thyromegaly.     Vascular: No JVD.     Trachea: No tracheal deviation.  Cardiovascular:     Rate and Rhythm: Normal rate and regular rhythm.     Heart sounds: Normal heart sounds.  Pulmonary:     Effort: No respiratory distress.     Breath sounds: No stridor. No wheezing.  Abdominal:     General: Bowel sounds are normal. There is no distension.     Palpations: Abdomen is soft. There is no mass.     Tenderness: There is no abdominal tenderness. There is no guarding or rebound.  Musculoskeletal:        General: No tenderness.     Cervical back: Normal range of motion and neck supple.  Lymphadenopathy:     Cervical: No cervical adenopathy.  Skin:    Findings: No erythema or rash.  Neurological:     Cranial Nerves: No cranial nerve deficit.     Motor: No abnormal muscle tone.     Coordination: Coordination normal.     Deep Tendon Reflexes: Reflexes normal.  Psychiatric:        Behavior: Behavior normal.        Thought Content: Thought content normal.        Judgment: Judgment normal.   swallen nasal lining Tearful a little  Lab Results  Component Value Date   WBC 4.0 02/21/2018   HGB 12.1 02/21/2018   HCT 35.7 (L) 02/21/2018   PLT 245.0 02/21/2018   GLUCOSE 97 10/25/2018   CHOL 176 10/25/2018   TRIG 132.0 10/25/2018   HDL 62.20 10/25/2018   LDLDIRECT 138.2 10/22/2010   LDLCALC 87 10/25/2018   ALT 16 10/25/2018   AST 17 10/25/2018   NA 139 10/25/2018   K 4.2 10/25/2018   CL 103 10/25/2018    CREATININE 0.82 10/25/2018   BUN 12 10/25/2018   CO2 28 10/25/2018   TSH 1.69 10/25/2018   HGBA1C 6.1 03/15/2017    No results found.  Assessment & Plan:   There are no diagnoses linked to this encounter.   No orders of the defined types  were placed in this encounter.    Follow-up: No follow-ups on file.  Walker Kehr, MD

## 2019-10-09 NOTE — Assessment & Plan Note (Signed)
On Vit D 

## 2019-10-09 NOTE — Assessment & Plan Note (Signed)
We discussed age appropriate health related issues, including available/recomended screening tests and vaccinations. We discussed a need for adhering to healthy diet and exercise. Labs were ordered to be later reviewed . All questions were answered.  Here for medicare wellness/physical  Diet: heart healthy  Physical activity: not sedentary  Depression/mood screen: negative  Hearing: intact to whispered voice  Visual acuity: grossly normal w/glasses, performs annual eye exam  ADLs: capable  Fall risk: low to none  Home safety: good  Cognitive evaluation: intact to orientation, naming, recall and repetition  EOL planning: adv directives, full code/ I agree  I have personally reviewed and have noted  1. The patient's medical, surgical and social history  2. Their use of alcohol, tobacco or illicit drugs  3. Their current medications and supplements  4. The patient's functional ability including ADL's, fall risks, home safety risks and hearing or visual impairment.  5. Diet and physical activities  6. Evidence for depression or mood disorders 7. The roster of all physicians providing medical care to patient - is listed in the Snapshot section of the chart and reviewed today.    Today patient counseled on age appropriate routine health concerns for screening and prevention, each reviewed and up to date or declined. Immunizations reviewed and up to date or declined. Labs ordered and reviewed. Risk factors for depression reviewed and negative. Hearing function and visual acuity are intact. ADLs screened and addressed as needed. Functional ability and level of safety reviewed and appropriate. Education, counseling and referrals performed based on assessed risks today. Patient provided with a copy of personalized plan for preventive services.  COVID vaccine in March orr later Labs

## 2019-10-10 ENCOUNTER — Other Ambulatory Visit (INDEPENDENT_AMBULATORY_CARE_PROVIDER_SITE_OTHER): Payer: Medicare Other

## 2019-10-10 ENCOUNTER — Other Ambulatory Visit: Payer: Self-pay

## 2019-10-10 DIAGNOSIS — I10 Essential (primary) hypertension: Secondary | ICD-10-CM | POA: Diagnosis not present

## 2019-10-10 DIAGNOSIS — R739 Hyperglycemia, unspecified: Secondary | ICD-10-CM

## 2019-10-10 DIAGNOSIS — E785 Hyperlipidemia, unspecified: Secondary | ICD-10-CM

## 2019-10-10 DIAGNOSIS — Z0000 Encounter for general adult medical examination without abnormal findings: Secondary | ICD-10-CM

## 2019-10-10 LAB — BASIC METABOLIC PANEL
BUN: 12 mg/dL (ref 6–23)
CO2: 29 mEq/L (ref 19–32)
Calcium: 9.9 mg/dL (ref 8.4–10.5)
Chloride: 103 mEq/L (ref 96–112)
Creatinine, Ser: 0.78 mg/dL (ref 0.40–1.20)
GFR: 72.4 mL/min (ref >60.00)
Glucose, Bld: 96 mg/dL (ref 70–99)
Potassium: 4.5 mEq/L (ref 3.5–5.1)
Sodium: 138 mEq/L (ref 135–145)

## 2019-10-10 LAB — URINALYSIS
Bilirubin Urine: NEGATIVE
Hgb urine dipstick: NEGATIVE
Ketones, ur: NEGATIVE
Leukocytes,Ua: NEGATIVE
Nitrite: NEGATIVE
Specific Gravity, Urine: 1.015 (ref 1.000–1.030)
Total Protein, Urine: NEGATIVE
Urine Glucose: NEGATIVE
Urobilinogen, UA: 0.2 (ref 0.0–1.0)
pH: 7 (ref 5.0–8.0)

## 2019-10-10 LAB — HEPATIC FUNCTION PANEL
ALT: 14 U/L (ref 0–35)
AST: 16 U/L (ref 0–37)
Albumin: 4.4 g/dL (ref 3.5–5.2)
Alkaline Phosphatase: 84 U/L (ref 39–117)
Bilirubin, Direct: 0.1 mg/dL (ref 0.0–0.3)
Total Bilirubin: 0.5 mg/dL (ref 0.2–1.2)
Total Protein: 6.7 g/dL (ref 6.0–8.3)

## 2019-10-10 LAB — LIPID PANEL
Cholesterol: 181 mg/dL (ref 0–200)
HDL: 60.8 mg/dL (ref >39.00)
LDL Cholesterol: 98 mg/dL (ref 0–99)
NonHDL: 119.9
Total CHOL/HDL Ratio: 3
Triglycerides: 108 mg/dL (ref 0.0–149.0)
VLDL: 21.6 mg/dL (ref 0.0–40.0)

## 2019-10-10 LAB — CBC WITH DIFFERENTIAL/PLATELET
Basophils Absolute: 0 10*3/uL (ref 0.0–0.1)
Basophils Relative: 1.3 % (ref 0.0–3.0)
Eosinophils Absolute: 0.1 10*3/uL (ref 0.0–0.7)
Eosinophils Relative: 2.8 % (ref 0.0–5.0)
HCT: 37.8 % (ref 36.0–46.0)
Hemoglobin: 12.5 g/dL (ref 12.0–15.0)
Lymphocytes Relative: 43.9 % (ref 12.0–46.0)
Lymphs Abs: 1.4 10*3/uL (ref 0.7–4.0)
MCHC: 33 g/dL (ref 30.0–36.0)
MCV: 91 fl (ref 78.0–100.0)
Monocytes Absolute: 0.3 10*3/uL (ref 0.1–1.0)
Monocytes Relative: 10.2 % (ref 3.0–12.0)
Neutro Abs: 1.4 10*3/uL (ref 1.4–7.7)
Neutrophils Relative %: 41.8 % — ABNORMAL LOW (ref 43.0–77.0)
Platelets: 251 10*3/uL (ref 150.0–400.0)
RBC: 4.16 Mil/uL (ref 3.87–5.11)
RDW: 14.2 % (ref 11.5–15.5)
WBC: 3.3 10*3/uL — ABNORMAL LOW (ref 4.0–10.5)

## 2019-10-10 LAB — HEMOGLOBIN A1C: Hgb A1c MFr Bld: 6 % (ref 4.6–6.5)

## 2019-10-10 LAB — TSH: TSH: 1.81 u[IU]/mL (ref 0.35–4.50)

## 2019-10-12 ENCOUNTER — Other Ambulatory Visit: Payer: Self-pay | Admitting: Internal Medicine

## 2019-10-28 ENCOUNTER — Telehealth: Payer: Self-pay

## 2019-10-28 NOTE — Telephone Encounter (Signed)
Ok Thx 

## 2019-10-28 NOTE — Telephone Encounter (Signed)
Please advise 

## 2019-10-28 NOTE — Telephone Encounter (Signed)
Patient calling and states that she was COVID + mid December. She was seen last month with Dr Alain Marion and he wanted her to wait until March to get COVID vaccine. States that she was able to schedule one for this Thursday 10/31/2019 and wanted to make sure that it is okay with Dr Alain Marion that she go ahead and get this vaccine. Please advise.  CB#: [phone] or [phone]

## 2019-10-29 NOTE — Telephone Encounter (Signed)
Pt.notified

## 2019-10-31 ENCOUNTER — Ambulatory Visit: Payer: Medicare Other

## 2019-11-04 ENCOUNTER — Other Ambulatory Visit: Payer: Self-pay

## 2019-11-04 DIAGNOSIS — M25551 Pain in right hip: Secondary | ICD-10-CM | POA: Diagnosis not present

## 2019-11-04 DIAGNOSIS — R293 Abnormal posture: Secondary | ICD-10-CM | POA: Diagnosis not present

## 2019-11-04 DIAGNOSIS — M25651 Stiffness of right hip, not elsewhere classified: Secondary | ICD-10-CM | POA: Diagnosis not present

## 2019-11-04 DIAGNOSIS — M6281 Muscle weakness (generalized): Secondary | ICD-10-CM | POA: Diagnosis not present

## 2019-11-04 MED ORDER — IRBESARTAN 300 MG PO TABS: 150.0000 mg | ORAL_TABLET | Freq: Two times a day (BID) | ORAL | 2 refills | Status: DC

## 2019-11-07 DIAGNOSIS — M25551 Pain in right hip: Secondary | ICD-10-CM | POA: Diagnosis not present

## 2019-11-07 DIAGNOSIS — R293 Abnormal posture: Secondary | ICD-10-CM | POA: Diagnosis not present

## 2019-11-07 DIAGNOSIS — M25651 Stiffness of right hip, not elsewhere classified: Secondary | ICD-10-CM | POA: Diagnosis not present

## 2019-11-07 DIAGNOSIS — M6281 Muscle weakness (generalized): Secondary | ICD-10-CM | POA: Diagnosis not present

## 2019-11-11 DIAGNOSIS — R293 Abnormal posture: Secondary | ICD-10-CM | POA: Diagnosis not present

## 2019-11-11 DIAGNOSIS — M25651 Stiffness of right hip, not elsewhere classified: Secondary | ICD-10-CM | POA: Diagnosis not present

## 2019-11-11 DIAGNOSIS — M25551 Pain in right hip: Secondary | ICD-10-CM | POA: Diagnosis not present

## 2019-11-11 DIAGNOSIS — M6281 Muscle weakness (generalized): Secondary | ICD-10-CM | POA: Diagnosis not present

## 2019-11-18 DIAGNOSIS — R293 Abnormal posture: Secondary | ICD-10-CM | POA: Diagnosis not present

## 2019-11-18 DIAGNOSIS — M6281 Muscle weakness (generalized): Secondary | ICD-10-CM | POA: Diagnosis not present

## 2019-11-18 DIAGNOSIS — M25651 Stiffness of right hip, not elsewhere classified: Secondary | ICD-10-CM | POA: Diagnosis not present

## 2019-11-18 DIAGNOSIS — M25551 Pain in right hip: Secondary | ICD-10-CM | POA: Diagnosis not present

## 2019-11-19 ENCOUNTER — Ambulatory Visit: Payer: Medicare Other

## 2019-11-25 DIAGNOSIS — M25551 Pain in right hip: Secondary | ICD-10-CM | POA: Diagnosis not present

## 2019-11-25 DIAGNOSIS — M25651 Stiffness of right hip, not elsewhere classified: Secondary | ICD-10-CM | POA: Diagnosis not present

## 2019-11-25 DIAGNOSIS — R293 Abnormal posture: Secondary | ICD-10-CM | POA: Diagnosis not present

## 2019-11-25 DIAGNOSIS — M6281 Muscle weakness (generalized): Secondary | ICD-10-CM | POA: Diagnosis not present

## 2019-11-26 ENCOUNTER — Ambulatory Visit: Payer: Medicare Other

## 2019-11-29 DIAGNOSIS — M6281 Muscle weakness (generalized): Secondary | ICD-10-CM | POA: Diagnosis not present

## 2019-11-29 DIAGNOSIS — M25551 Pain in right hip: Secondary | ICD-10-CM | POA: Diagnosis not present

## 2019-11-29 DIAGNOSIS — R293 Abnormal posture: Secondary | ICD-10-CM | POA: Diagnosis not present

## 2019-11-29 DIAGNOSIS — M25651 Stiffness of right hip, not elsewhere classified: Secondary | ICD-10-CM | POA: Diagnosis not present

## 2019-12-02 ENCOUNTER — Other Ambulatory Visit: Payer: Self-pay | Admitting: Internal Medicine

## 2019-12-10 ENCOUNTER — Ambulatory Visit: Payer: Medicare Other

## 2019-12-12 DIAGNOSIS — R293 Abnormal posture: Secondary | ICD-10-CM | POA: Diagnosis not present

## 2019-12-12 DIAGNOSIS — M25651 Stiffness of right hip, not elsewhere classified: Secondary | ICD-10-CM | POA: Diagnosis not present

## 2019-12-12 DIAGNOSIS — M25551 Pain in right hip: Secondary | ICD-10-CM | POA: Diagnosis not present

## 2019-12-12 DIAGNOSIS — M6281 Muscle weakness (generalized): Secondary | ICD-10-CM | POA: Diagnosis not present

## 2019-12-17 ENCOUNTER — Encounter: Payer: Self-pay | Admitting: Internal Medicine

## 2019-12-17 ENCOUNTER — Ambulatory Visit (INDEPENDENT_AMBULATORY_CARE_PROVIDER_SITE_OTHER): Payer: Medicare Other

## 2019-12-17 ENCOUNTER — Other Ambulatory Visit: Payer: Self-pay

## 2019-12-17 ENCOUNTER — Ambulatory Visit (INDEPENDENT_AMBULATORY_CARE_PROVIDER_SITE_OTHER): Payer: Medicare Other | Admitting: Internal Medicine

## 2019-12-17 DIAGNOSIS — I10 Essential (primary) hypertension: Secondary | ICD-10-CM

## 2019-12-17 DIAGNOSIS — M19049 Primary osteoarthritis, unspecified hand: Secondary | ICD-10-CM | POA: Diagnosis not present

## 2019-12-17 DIAGNOSIS — E559 Vitamin D deficiency, unspecified: Secondary | ICD-10-CM | POA: Diagnosis not present

## 2019-12-17 DIAGNOSIS — M19042 Primary osteoarthritis, left hand: Secondary | ICD-10-CM | POA: Diagnosis not present

## 2019-12-17 DIAGNOSIS — M19041 Primary osteoarthritis, right hand: Secondary | ICD-10-CM

## 2019-12-17 LAB — BASIC METABOLIC PANEL
BUN: 14 mg/dL (ref 6–23)
CO2: 29 mEq/L (ref 19–32)
Calcium: 9.8 mg/dL (ref 8.4–10.5)
Chloride: 103 mEq/L (ref 96–112)
Creatinine, Ser: 0.93 mg/dL (ref 0.40–1.20)
GFR: 59.07 mL/min — ABNORMAL LOW (ref >60.00)
Glucose, Bld: 111 mg/dL — ABNORMAL HIGH (ref 70–99)
Potassium: 4 mEq/L (ref 3.5–5.1)
Sodium: 138 mEq/L (ref 135–145)

## 2019-12-17 LAB — VITAMIN D 25 HYDROXY (VIT D DEFICIENCY, FRACTURES): VITD: 29.26 ng/mL — ABNORMAL LOW (ref 30.00–100.00)

## 2019-12-17 LAB — SEDIMENTATION RATE: Sed Rate: 9 mm/hr (ref 0–30)

## 2019-12-17 NOTE — Assessment & Plan Note (Signed)
Worse 

## 2019-12-17 NOTE — Assessment & Plan Note (Signed)
Labs

## 2019-12-17 NOTE — Patient Instructions (Signed)
Voltaren gel for hands

## 2019-12-17 NOTE — Progress Notes (Signed)
Subjective:  Patient ID: Sharon Paul, female    DOB: Jul 06, 1946  Age: 74 y.o. MRN: [id number]  CC: No chief complaint on file.   HPI Sharon Paul presents for B hands arthritis - worse in the past year, fingers are crooked, stiff; dropping things Tylenol helps at HS F/u Vit D def, HTN C/o fall - had R shoulder pain; MRI shoulder - tear of her rotator cuff    Outpatient Medications Prior to Visit  Medication Sig Dispense Refill  . acetaminophen (TYLENOL) 500 MG tablet Take 500 mg by mouth every 6 (six) hours as needed.    [name] albuterol (VENTOLIN HFA) 108 (90 Base) MCG/ACT inhaler TAKE 2 PUFFS BY MOUTH EVERY 6 HOURS AS NEEDED FOR WHEEZE OR SHORTNESS OF BREATH 18 g 3  . aspirin 81 MG tablet Take 81 mg by mouth daily.    [name] azelastine (ASTELIN) 0.1 % nasal spray Place 2 sprays into both nostrils 2 (two) times daily. 30 mL 0  . Cholecalciferol 2000 UNITS TABS Take 1 tablet by mouth daily.    . diazepam (VALIUM) 5 MG tablet Take 5 mg by mouth 2 (two) times daily as needed.    . dicyclomine (BENTYL) 10 MG capsule Take 1 capsule (10 mg total) by mouth 4 (four) times daily -  before meals and at bedtime. 90 capsule 0  . diphenoxylate-atropine (LOMOTIL) 2.5-0.025 MG tablet Take 1-2 tablets by mouth 4 (four) times daily as needed for diarrhea or loose stools. 60 tablet 0  . FLUoxetine (PROZAC) 10 MG capsule Take 1 capsule (10 mg total) by mouth daily. 90 capsule 3  . fluticasone (FLONASE) 50 MCG/ACT nasal spray PLACE 2 SPRAYS INTO THE NOSE DAILY. 48 g 3  . ibuprofen (ADVIL,MOTRIN) 800 MG tablet Take 1 tablet (800 mg total) by mouth every 8 (eight) hours as needed. 60 tablet 1  . irbesartan (AVAPRO) 300 MG tablet Take 0.5 tablets (150 mg total) by mouth 2 (two) times daily. 90 tablet 2  . labetalol (NORMODYNE) 300 MG tablet Take 1 tablet (300 mg total) by mouth 2 (two) times daily. 180 tablet 3  . LORazepam (ATIVAN) 0.5 MG tablet TAKE 1 TABLET BY MOUTH TWICE A DAY (Patient taking  differently: 0.5 mg as needed. ) 60 tablet 3  . meloxicam (MOBIC) 7.5 MG tablet TAKE 1 TABLET BY MOUTH TWICE A DAY AS NEEDED FOR PAIN 60 tablet 2  . NONFORMULARY OR COMPOUNDED ITEM Estradiol [id number] prefilled applicators Insert one appful vaginally twice weekly. 24 each 0  . pantoprazole (PROTONIX) 40 MG tablet TAKE 1 TABLET BY MOUTH  DAILY 90 tablet 1  . rosuvastatin (CRESTOR) 10 MG tablet Take 1 tablet (10 mg total) by mouth every other day. 45 tablet 3  . valACYclovir (VALTREX) 1000 MG tablet Take 2 tablets at the earliest onset of cold sore symptoms.  Repeat with 2 tablets 12 hours later 12 tablet 1  . cefdinir (OMNICEF) 300 MG capsule Take 1 capsule (300 mg total) by mouth 2 (two) times daily. 20 capsule 0   No facility-administered medications prior to visit.    ROS: Review of Systems  Constitutional: Negative for activity change, appetite change, chills, fatigue and unexpected weight change.  HENT: Negative for congestion, mouth sores and sinus pressure.   Eyes: Negative for visual disturbance.  Respiratory: Negative for cough and chest tightness.   Gastrointestinal: Negative for abdominal pain and nausea.  Genitourinary: Negative for difficulty urinating, frequency and vaginal pain.  Musculoskeletal: Positive for arthralgias and back  pain. Negative for gait problem.  Skin: Negative for pallor and rash.  Neurological: Negative for dizziness, tremors, weakness, numbness and headaches.  Psychiatric/Behavioral: Negative for confusion and sleep disturbance.    Objective:  BP 132/82 (BP Location: Left Arm, Patient Position: Sitting, Cuff Size: Normal)   Pulse 64   Temp 98.5 F (36.9 C) (Oral)   Ht 4\' 11"  (1.499 m)   Wt 124 lb (56.2 kg)   SpO2 99%   BMI 25.04 kg/m   BP Readings from Last 3 Encounters:  12/17/19 132/82  10/09/19 140/78  09/11/19 (!) 152/84    Wt Readings from Last 3 Encounters:  12/17/19 124 lb (56.2 kg)  10/09/19 123 lb (55.8 kg)  06/06/19 128 lb (58.1  kg)    Physical Exam Constitutional:      General: She is not in acute distress.    Appearance: She is well-developed.  HENT:     Head: Normocephalic.     Right Ear: External ear normal.     Left Ear: External ear normal.     Nose: Nose normal.  Eyes:     General:        Right eye: No discharge.        Left eye: No discharge.     Conjunctiva/sclera: Conjunctivae normal.     Pupils: Pupils are equal, round, and reactive to light.  Neck:     Thyroid: No thyromegaly.     Vascular: No JVD.     Trachea: No tracheal deviation.  Cardiovascular:     Rate and Rhythm: Normal rate and regular rhythm.     Heart sounds: Normal heart sounds.  Pulmonary:     Effort: No respiratory distress.     Breath sounds: No stridor. No wheezing.  Abdominal:     General: Bowel sounds are normal. There is no distension.     Palpations: Abdomen is soft. There is no mass.     Tenderness: There is no abdominal tenderness. There is no guarding or rebound.  Musculoskeletal:        General: Tenderness and deformity present.     Cervical back: Normal range of motion and neck supple.  Lymphadenopathy:     Cervical: No cervical adenopathy.  Skin:    Findings: No erythema or rash.  Neurological:     Cranial Nerves: No cranial nerve deficit.     Motor: No abnormal muscle tone.     Coordination: Coordination normal.     Deep Tendon Reflexes: Reflexes normal.  Psychiatric:        Behavior: Behavior normal.        Thought Content: Thought content normal.        Judgment: Judgment normal.    Fingers w/OA deformties B  Lab Results  Component Value Date   WBC 3.3 (L) 10/10/2019   HGB 12.5 10/10/2019   HCT 37.8 10/10/2019   PLT 251.0 10/10/2019   GLUCOSE 96 10/10/2019   CHOL 181 10/10/2019   TRIG 108.0 10/10/2019   HDL 60.80 10/10/2019   LDLDIRECT 138.2 10/22/2010   LDLCALC 98 10/10/2019   ALT 14 10/10/2019   AST 16 10/10/2019   NA 138 10/10/2019   K 4.5 10/10/2019   CL 103 10/10/2019    CREATININE 0.78 10/10/2019   BUN 12 10/10/2019   CO2 29 10/10/2019   TSH 1.81 10/10/2019   HGBA1C 6.0 10/10/2019    No results found.  Assessment & Plan:   There are no diagnoses linked to this encounter.  No orders of the defined types were placed in this encounter.    Follow-up: No follow-ups on file.  Walker Kehr, MD

## 2019-12-19 ENCOUNTER — Other Ambulatory Visit: Payer: Self-pay | Admitting: Internal Medicine

## 2019-12-19 DIAGNOSIS — R293 Abnormal posture: Secondary | ICD-10-CM | POA: Diagnosis not present

## 2019-12-19 DIAGNOSIS — M6281 Muscle weakness (generalized): Secondary | ICD-10-CM | POA: Diagnosis not present

## 2019-12-19 DIAGNOSIS — M25551 Pain in right hip: Secondary | ICD-10-CM | POA: Diagnosis not present

## 2019-12-19 DIAGNOSIS — M25651 Stiffness of right hip, not elsewhere classified: Secondary | ICD-10-CM | POA: Diagnosis not present

## 2019-12-19 LAB — ANA: Anti Nuclear Antibody (ANA): NEGATIVE

## 2019-12-19 LAB — RHEUMATOID FACTOR: Rheumatoid fact SerPl-aCnc: <14 [IU]/mL

## 2019-12-20 ENCOUNTER — Other Ambulatory Visit: Payer: Self-pay | Admitting: Internal Medicine

## 2019-12-20 MED ORDER — VITAMIN D3 1.25 MG (50000 UT) PO CAPS: 1.0000 | ORAL_CAPSULE | ORAL | 0 refills | Status: DC

## 2019-12-24 ENCOUNTER — Encounter: Payer: Self-pay | Admitting: Internal Medicine

## 2019-12-24 NOTE — Assessment & Plan Note (Signed)
BP Readings from Last 3 Encounters:  12/17/19 132/82  10/09/19 140/78  09/11/19 (!) 152/84

## 2020-01-03 DIAGNOSIS — M25651 Stiffness of right hip, not elsewhere classified: Secondary | ICD-10-CM | POA: Diagnosis not present

## 2020-01-03 DIAGNOSIS — M6281 Muscle weakness (generalized): Secondary | ICD-10-CM | POA: Diagnosis not present

## 2020-01-03 DIAGNOSIS — R293 Abnormal posture: Secondary | ICD-10-CM | POA: Diagnosis not present

## 2020-01-03 DIAGNOSIS — M25551 Pain in right hip: Secondary | ICD-10-CM | POA: Diagnosis not present

## 2020-01-07 DIAGNOSIS — K045 Chronic apical periodontitis: Secondary | ICD-10-CM | POA: Diagnosis not present

## 2020-01-10 DIAGNOSIS — M7542 Impingement syndrome of left shoulder: Secondary | ICD-10-CM | POA: Diagnosis not present

## 2020-01-10 DIAGNOSIS — M659 Synovitis and tenosynovitis, unspecified: Secondary | ICD-10-CM | POA: Diagnosis not present

## 2020-01-10 DIAGNOSIS — M75122 Complete rotator cuff tear or rupture of left shoulder, not specified as traumatic: Secondary | ICD-10-CM | POA: Diagnosis not present

## 2020-01-10 DIAGNOSIS — M958 Other specified acquired deformities of musculoskeletal system: Secondary | ICD-10-CM | POA: Diagnosis not present

## 2020-01-10 DIAGNOSIS — M24112 Other articular cartilage disorders, left shoulder: Secondary | ICD-10-CM | POA: Diagnosis not present

## 2020-01-10 DIAGNOSIS — S46092D Other injury of muscle(s) and tendon(s) of the rotator cuff of left shoulder, subsequent encounter: Secondary | ICD-10-CM | POA: Diagnosis not present

## 2020-01-10 DIAGNOSIS — M94212 Chondromalacia, left shoulder: Secondary | ICD-10-CM | POA: Diagnosis not present

## 2020-01-10 DIAGNOSIS — M19012 Primary osteoarthritis, left shoulder: Secondary | ICD-10-CM | POA: Diagnosis not present

## 2020-01-10 DIAGNOSIS — I890 Lymphedema, not elsewhere classified: Secondary | ICD-10-CM | POA: Diagnosis not present

## 2020-01-10 DIAGNOSIS — S46212A Strain of muscle, fascia and tendon of other parts of biceps, left arm, initial encounter: Secondary | ICD-10-CM | POA: Diagnosis not present

## 2020-01-10 DIAGNOSIS — S43432A Superior glenoid labrum lesion of left shoulder, initial encounter: Secondary | ICD-10-CM | POA: Diagnosis not present

## 2020-01-10 DIAGNOSIS — Z4889 Encounter for other specified surgical aftercare: Secondary | ICD-10-CM | POA: Diagnosis not present

## 2020-01-10 DIAGNOSIS — M7522 Bicipital tendinitis, left shoulder: Secondary | ICD-10-CM | POA: Diagnosis not present

## 2020-01-10 DIAGNOSIS — G8918 Other acute postprocedural pain: Secondary | ICD-10-CM | POA: Diagnosis not present

## 2020-01-11 DIAGNOSIS — M25512 Pain in left shoulder: Secondary | ICD-10-CM | POA: Diagnosis not present

## 2020-01-11 DIAGNOSIS — Z4889 Encounter for other specified surgical aftercare: Secondary | ICD-10-CM | POA: Diagnosis not present

## 2020-01-11 DIAGNOSIS — S46092D Other injury of muscle(s) and tendon(s) of the rotator cuff of left shoulder, subsequent encounter: Secondary | ICD-10-CM | POA: Diagnosis not present

## 2020-01-11 DIAGNOSIS — I890 Lymphedema, not elsewhere classified: Secondary | ICD-10-CM | POA: Diagnosis not present

## 2020-01-20 DIAGNOSIS — M25612 Stiffness of left shoulder, not elsewhere classified: Secondary | ICD-10-CM | POA: Diagnosis not present

## 2020-01-20 DIAGNOSIS — M25512 Pain in left shoulder: Secondary | ICD-10-CM | POA: Diagnosis not present

## 2020-01-20 DIAGNOSIS — R293 Abnormal posture: Secondary | ICD-10-CM | POA: Diagnosis not present

## 2020-01-20 DIAGNOSIS — Z4789 Encounter for other orthopedic aftercare: Secondary | ICD-10-CM | POA: Diagnosis not present

## 2020-01-24 DIAGNOSIS — R293 Abnormal posture: Secondary | ICD-10-CM | POA: Diagnosis not present

## 2020-01-24 DIAGNOSIS — Z4789 Encounter for other orthopedic aftercare: Secondary | ICD-10-CM | POA: Diagnosis not present

## 2020-01-24 DIAGNOSIS — M25612 Stiffness of left shoulder, not elsewhere classified: Secondary | ICD-10-CM | POA: Diagnosis not present

## 2020-01-24 DIAGNOSIS — M25512 Pain in left shoulder: Secondary | ICD-10-CM | POA: Diagnosis not present

## 2020-01-25 ENCOUNTER — Other Ambulatory Visit: Payer: Self-pay | Admitting: Internal Medicine

## 2020-01-31 DIAGNOSIS — M25512 Pain in left shoulder: Secondary | ICD-10-CM | POA: Diagnosis not present

## 2020-01-31 DIAGNOSIS — M25612 Stiffness of left shoulder, not elsewhere classified: Secondary | ICD-10-CM | POA: Diagnosis not present

## 2020-01-31 DIAGNOSIS — Z4789 Encounter for other orthopedic aftercare: Secondary | ICD-10-CM | POA: Diagnosis not present

## 2020-01-31 DIAGNOSIS — R293 Abnormal posture: Secondary | ICD-10-CM | POA: Diagnosis not present

## 2020-02-07 DIAGNOSIS — M25612 Stiffness of left shoulder, not elsewhere classified: Secondary | ICD-10-CM | POA: Diagnosis not present

## 2020-02-07 DIAGNOSIS — Z4789 Encounter for other orthopedic aftercare: Secondary | ICD-10-CM | POA: Diagnosis not present

## 2020-02-07 DIAGNOSIS — M25512 Pain in left shoulder: Secondary | ICD-10-CM | POA: Diagnosis not present

## 2020-02-07 DIAGNOSIS — R293 Abnormal posture: Secondary | ICD-10-CM | POA: Diagnosis not present

## 2020-02-13 DIAGNOSIS — Z4789 Encounter for other orthopedic aftercare: Secondary | ICD-10-CM | POA: Diagnosis not present

## 2020-02-13 DIAGNOSIS — R293 Abnormal posture: Secondary | ICD-10-CM | POA: Diagnosis not present

## 2020-02-13 DIAGNOSIS — M25512 Pain in left shoulder: Secondary | ICD-10-CM | POA: Diagnosis not present

## 2020-02-13 DIAGNOSIS — M25612 Stiffness of left shoulder, not elsewhere classified: Secondary | ICD-10-CM | POA: Diagnosis not present

## 2020-02-19 ENCOUNTER — Other Ambulatory Visit: Payer: Self-pay | Admitting: Internal Medicine

## 2020-02-20 DIAGNOSIS — R293 Abnormal posture: Secondary | ICD-10-CM | POA: Diagnosis not present

## 2020-02-20 DIAGNOSIS — Z4789 Encounter for other orthopedic aftercare: Secondary | ICD-10-CM | POA: Diagnosis not present

## 2020-02-20 DIAGNOSIS — M25512 Pain in left shoulder: Secondary | ICD-10-CM | POA: Diagnosis not present

## 2020-02-20 DIAGNOSIS — M25612 Stiffness of left shoulder, not elsewhere classified: Secondary | ICD-10-CM | POA: Diagnosis not present

## 2020-02-27 DIAGNOSIS — M25612 Stiffness of left shoulder, not elsewhere classified: Secondary | ICD-10-CM | POA: Diagnosis not present

## 2020-02-27 DIAGNOSIS — M25512 Pain in left shoulder: Secondary | ICD-10-CM | POA: Diagnosis not present

## 2020-02-27 DIAGNOSIS — R293 Abnormal posture: Secondary | ICD-10-CM | POA: Diagnosis not present

## 2020-02-27 DIAGNOSIS — Z4789 Encounter for other orthopedic aftercare: Secondary | ICD-10-CM | POA: Diagnosis not present

## 2020-03-12 DIAGNOSIS — M25512 Pain in left shoulder: Secondary | ICD-10-CM | POA: Diagnosis not present

## 2020-03-12 DIAGNOSIS — Z4789 Encounter for other orthopedic aftercare: Secondary | ICD-10-CM | POA: Diagnosis not present

## 2020-03-12 DIAGNOSIS — R293 Abnormal posture: Secondary | ICD-10-CM | POA: Diagnosis not present

## 2020-03-12 DIAGNOSIS — M25612 Stiffness of left shoulder, not elsewhere classified: Secondary | ICD-10-CM | POA: Diagnosis not present

## 2020-03-18 ENCOUNTER — Other Ambulatory Visit: Payer: Self-pay | Admitting: Internal Medicine

## 2020-03-18 DIAGNOSIS — M7502 Adhesive capsulitis of left shoulder: Secondary | ICD-10-CM | POA: Diagnosis not present

## 2020-03-19 DIAGNOSIS — R293 Abnormal posture: Secondary | ICD-10-CM | POA: Diagnosis not present

## 2020-03-19 DIAGNOSIS — Z4789 Encounter for other orthopedic aftercare: Secondary | ICD-10-CM | POA: Diagnosis not present

## 2020-03-19 DIAGNOSIS — M25512 Pain in left shoulder: Secondary | ICD-10-CM | POA: Diagnosis not present

## 2020-03-19 DIAGNOSIS — M25612 Stiffness of left shoulder, not elsewhere classified: Secondary | ICD-10-CM | POA: Diagnosis not present

## 2020-03-26 DIAGNOSIS — M25512 Pain in left shoulder: Secondary | ICD-10-CM | POA: Diagnosis not present

## 2020-03-26 DIAGNOSIS — M25612 Stiffness of left shoulder, not elsewhere classified: Secondary | ICD-10-CM | POA: Diagnosis not present

## 2020-03-26 DIAGNOSIS — R293 Abnormal posture: Secondary | ICD-10-CM | POA: Diagnosis not present

## 2020-03-26 DIAGNOSIS — Z4789 Encounter for other orthopedic aftercare: Secondary | ICD-10-CM | POA: Diagnosis not present

## 2020-04-01 DIAGNOSIS — M25512 Pain in left shoulder: Secondary | ICD-10-CM | POA: Diagnosis not present

## 2020-04-01 DIAGNOSIS — Z4789 Encounter for other orthopedic aftercare: Secondary | ICD-10-CM | POA: Diagnosis not present

## 2020-04-01 DIAGNOSIS — R293 Abnormal posture: Secondary | ICD-10-CM | POA: Diagnosis not present

## 2020-04-01 DIAGNOSIS — M25612 Stiffness of left shoulder, not elsewhere classified: Secondary | ICD-10-CM | POA: Diagnosis not present

## 2020-04-08 ENCOUNTER — Encounter: Payer: Self-pay | Admitting: Internal Medicine

## 2020-04-08 ENCOUNTER — Ambulatory Visit (INDEPENDENT_AMBULATORY_CARE_PROVIDER_SITE_OTHER): Payer: Medicare Other | Admitting: Internal Medicine

## 2020-04-08 ENCOUNTER — Other Ambulatory Visit: Payer: Self-pay

## 2020-04-08 DIAGNOSIS — E559 Vitamin D deficiency, unspecified: Secondary | ICD-10-CM

## 2020-04-08 DIAGNOSIS — G8929 Other chronic pain: Secondary | ICD-10-CM

## 2020-04-08 DIAGNOSIS — M25511 Pain in right shoulder: Secondary | ICD-10-CM | POA: Diagnosis not present

## 2020-04-08 DIAGNOSIS — I10 Essential (primary) hypertension: Secondary | ICD-10-CM

## 2020-04-08 DIAGNOSIS — E785 Hyperlipidemia, unspecified: Secondary | ICD-10-CM

## 2020-04-08 MED ORDER — SHINGRIX 50 MCG/0.5ML IM SUSR: 0.5000 mL | Freq: Once | INTRAMUSCULAR | 1 refills | Status: AC

## 2020-04-08 MED ORDER — MELOXICAM 7.5 MG PO TABS: 7.5000 mg | ORAL_TABLET | Freq: Every day | ORAL | 1 refills | Status: DC | PRN

## 2020-04-08 MED ORDER — IRBESARTAN 300 MG PO TABS: 150.0000 mg | ORAL_TABLET | Freq: Two times a day (BID) | ORAL | 3 refills | Status: DC

## 2020-04-08 NOTE — Assessment & Plan Note (Signed)
On Crestor 

## 2020-04-08 NOTE — Progress Notes (Signed)
Subjective:  Patient ID: Sharon Paul, female    DOB: [birth year]  Age: 74 y.o. MRN: [id number]  CC: No chief complaint on file.   HPI Sharon Paul presents for HTN, dyslipidemia, OA f/u She had a L rotator cuff repair May 2021, now she has a frozen shoulder x 1 month  Outpatient Medications Prior to Visit  Medication Sig Dispense Refill  . acetaminophen (TYLENOL) 500 MG tablet Take 500 mg by mouth every 6 (six) hours as needed.    [name] albuterol (VENTOLIN HFA) 108 (90 Base) MCG/ACT inhaler TAKE 2 PUFFS BY MOUTH EVERY 6 HOURS AS NEEDED FOR WHEEZE OR SHORTNESS OF BREATH 18 g 3  . aspirin 81 MG tablet Take 81 mg by mouth daily.    [name] azelastine (ASTELIN) 0.1 % nasal spray Place 2 sprays into both nostrils 2 (two) times daily. 30 mL 0  . Cholecalciferol (VITAMIN D3) 1.25 MG (50000 UT) CAPS Take 1 capsule by mouth once a week. 6 capsule 0  . Cholecalciferol 2000 UNITS TABS Take 1 tablet by mouth daily.    . diazepam (VALIUM) 5 MG tablet Take 5 mg by mouth 2 (two) times daily as needed.    . dicyclomine (BENTYL) 10 MG capsule Take 1 capsule (10 mg total) by mouth 4 (four) times daily -  before meals and at bedtime. 90 capsule 0  . diphenoxylate-atropine (LOMOTIL) 2.5-0.025 MG tablet Take 1-2 tablets by mouth 4 (four) times daily as needed for diarrhea or loose stools. 60 tablet 0  . FLUoxetine (PROZAC) 10 MG capsule Take 1 capsule (10 mg total) by mouth daily. 90 capsule 3  . fluticasone (FLONASE) 50 MCG/ACT nasal spray USE 2 SPRAYS NASALLY DAILY 48 g 3  . ibuprofen (ADVIL,MOTRIN) 800 MG tablet Take 1 tablet (800 mg total) by mouth every 8 (eight) hours as needed. 60 tablet 1  . irbesartan (AVAPRO) 300 MG tablet Take 0.5 tablets (150 mg total) by mouth 2 (two) times daily. 90 tablet 2  . labetalol (NORMODYNE) 300 MG tablet TAKE 1 TABLET BY MOUTH  TWICE DAILY 180 tablet 3  . LORazepam (ATIVAN) 0.5 MG tablet TAKE 1 TABLET BY MOUTH TWICE A DAY (Patient taking differently: 0.5 mg as  needed. ) 60 tablet 3  . meloxicam (MOBIC) 7.5 MG tablet TAKE 1 TABLET BY MOUTH TWICE A DAY AS NEEDED FOR PAIN 60 tablet 2  . NONFORMULARY OR COMPOUNDED ITEM Estradiol 2.22% prefilled applicators Insert one appful vaginally twice weekly. 24 each 0  . pantoprazole (PROTONIX) 40 MG tablet TAKE 1 TABLET BY MOUTH  DAILY 90 tablet 3  . rosuvastatin (CRESTOR) 10 MG tablet Take 1 tablet (10 mg total) by mouth every other day. 45 tablet 3  . valACYclovir (VALTREX) 1000 MG tablet Take 2 tablets at the earliest onset of cold sore symptoms.  Repeat with 2 tablets 12 hours later 12 tablet 1   No facility-administered medications prior to visit.    ROS: Review of Systems  Constitutional: Negative for activity change, appetite change, chills, fatigue and unexpected weight change.  HENT: Negative for congestion, mouth sores and sinus pressure.   Eyes: Negative for visual disturbance.  Respiratory: Negative for cough and chest tightness.   Gastrointestinal: Negative for abdominal pain and nausea.  Genitourinary: Negative for difficulty urinating, frequency and vaginal pain.  Musculoskeletal: Positive for arthralgias. Negative for back pain and gait problem.  Skin: Negative for pallor and rash.  Neurological: Negative for dizziness, tremors, weakness, numbness and headaches.  Psychiatric/Behavioral: Negative for  confusion and sleep disturbance. The patient is nervous/anxious.     Objective:  BP 122/70 (BP Location: Right Arm, Patient Position: Sitting, Cuff Size: Normal)   Pulse 65   Temp 98.4 F (36.9 C) (Oral)   Ht 4\' 11"  (1.499 m)   Wt 126 lb (57.2 kg)   SpO2 98%   BMI 25.45 kg/m   BP Readings from Last 3 Encounters:  04/08/20 122/70  12/17/19 132/82  10/09/19 140/78    Wt Readings from Last 3 Encounters:  04/08/20 126 lb (57.2 kg)  12/17/19 124 lb (56.2 kg)  10/09/19 123 lb (55.8 kg)    Physical Exam Constitutional:      General: She is not in acute distress.    Appearance: She  is well-developed.  HENT:     Head: Normocephalic.     Right Ear: External ear normal.     Left Ear: External ear normal.     Nose: Nose normal.  Eyes:     General:        Right eye: No discharge.        Left eye: No discharge.     Conjunctiva/sclera: Conjunctivae normal.     Pupils: Pupils are equal, round, and reactive to light.  Neck:     Thyroid: No thyromegaly.     Vascular: No JVD.     Trachea: No tracheal deviation.  Cardiovascular:     Rate and Rhythm: Normal rate and regular rhythm.     Heart sounds: Normal heart sounds.  Pulmonary:     Effort: No respiratory distress.     Breath sounds: No stridor. No wheezing.  Abdominal:     General: Bowel sounds are normal. There is no distension.     Palpations: Abdomen is soft. There is no mass.     Tenderness: There is no abdominal tenderness. There is no guarding or rebound.  Musculoskeletal:        General: No tenderness.     Cervical back: Normal range of motion and neck supple.  Lymphadenopathy:     Cervical: No cervical adenopathy.  Skin:    Findings: No erythema or rash.  Neurological:     Cranial Nerves: No cranial nerve deficit.     Motor: No abnormal muscle tone.     Coordination: Coordination normal.     Deep Tendon Reflexes: Reflexes normal.  Psychiatric:        Behavior: Behavior normal.        Thought Content: Thought content normal.        Judgment: Judgment normal.   L shoulder - a little restricted ROM  Lab Results  Component Value Date   WBC 3.3 (L) 10/10/2019   HGB 12.5 10/10/2019   HCT 37.8 10/10/2019   PLT 251.0 10/10/2019   GLUCOSE 111 (H) 12/17/2019   CHOL 181 10/10/2019   TRIG 108.0 10/10/2019   HDL 60.80 10/10/2019   LDLDIRECT 138.2 10/22/2010   LDLCALC 98 10/10/2019   ALT 14 10/10/2019   AST 16 10/10/2019   NA 138 12/17/2019   K 4.0 12/17/2019   CL 103 12/17/2019   CREATININE 0.93 12/17/2019   BUN 14 12/17/2019   CO2 29 12/17/2019   TSH 1.81 10/10/2019   HGBA1C 6.0 10/10/2019      No results found.  Assessment & Plan:    Walker Kehr, MD

## 2020-04-08 NOTE — Assessment & Plan Note (Signed)
She had a L rotator cuff repair May 2021, now she has a frozen shoulder x 1 month In PT Meloxicam low dose prn F/u w/Dr Onnie Graham

## 2020-04-08 NOTE — Assessment & Plan Note (Signed)
Avapro 

## 2020-04-08 NOTE — Assessment & Plan Note (Signed)
Vit D 

## 2020-04-09 DIAGNOSIS — Z4789 Encounter for other orthopedic aftercare: Secondary | ICD-10-CM | POA: Diagnosis not present

## 2020-04-09 DIAGNOSIS — R293 Abnormal posture: Secondary | ICD-10-CM | POA: Diagnosis not present

## 2020-04-09 DIAGNOSIS — M25512 Pain in left shoulder: Secondary | ICD-10-CM | POA: Diagnosis not present

## 2020-04-09 DIAGNOSIS — M25612 Stiffness of left shoulder, not elsewhere classified: Secondary | ICD-10-CM | POA: Diagnosis not present

## 2020-04-13 ENCOUNTER — Ambulatory Visit: Payer: Medicare Other | Admitting: Obstetrics and Gynecology

## 2020-04-13 ENCOUNTER — Other Ambulatory Visit: Payer: Self-pay

## 2020-04-13 ENCOUNTER — Encounter: Payer: Self-pay | Admitting: Obstetrics and Gynecology

## 2020-04-13 VITALS — BP 118/76 | Ht <= 58 in | Wt 127.0 lb

## 2020-04-13 DIAGNOSIS — M8580 Other specified disorders of bone density and structure, unspecified site: Secondary | ICD-10-CM

## 2020-04-13 DIAGNOSIS — Z01419 Encounter for gynecological examination (general) (routine) without abnormal findings: Secondary | ICD-10-CM

## 2020-04-13 DIAGNOSIS — Z1321 Encounter for screening for nutritional disorder: Secondary | ICD-10-CM | POA: Diagnosis not present

## 2020-04-13 DIAGNOSIS — E559 Vitamin D deficiency, unspecified: Secondary | ICD-10-CM | POA: Diagnosis not present

## 2020-04-13 DIAGNOSIS — N952 Postmenopausal atrophic vaginitis: Secondary | ICD-10-CM | POA: Diagnosis not present

## 2020-04-13 LAB — VITAMIN D 25 HYDROXY (VIT D DEFICIENCY, FRACTURES): Vit D, 25-Hydroxy: 32 ng/mL (ref 30–100)

## 2020-04-13 MED ORDER — NONFORMULARY OR COMPOUNDED ITEM: 3 refills | Status: DC

## 2020-04-13 NOTE — Progress Notes (Signed)
Sharon Paul 1945-10-05 [id number]  SUBJECTIVE:  74 y.o. [id number] female here for an annual routine gynecologic exam.  Having discomfort with intercourse due to vaginal dryness.  She did stop taking the vaginal estrogen as she was feeling hot flashes after use and she was concerned given her family history of stroke. She has no other gynecologic concerns.  Current Outpatient Medications  Medication Sig Dispense Refill  . acetaminophen (TYLENOL) 500 MG tablet Take 500 mg by mouth every 6 (six) hours as needed.    [name] albuterol (VENTOLIN HFA) 108 (90 Base) MCG/ACT inhaler TAKE 2 PUFFS BY MOUTH EVERY 6 HOURS AS NEEDED FOR WHEEZE OR SHORTNESS OF BREATH 18 g 3  . aspirin 81 MG tablet Take 81 mg by mouth daily.    [name] azelastine (ASTELIN) 0.1 % nasal spray Place 2 sprays into both nostrils 2 (two) times daily. 30 mL 0  . Cholecalciferol (VITAMIN D3) 1.25 MG (50000 UT) CAPS Take 1 capsule by mouth once a week. 6 capsule 0  . Cholecalciferol 2000 UNITS TABS Take 1 tablet by mouth daily.    . diazepam (VALIUM) 5 MG tablet Take 5 mg by mouth 2 (two) times daily as needed.    . dicyclomine (BENTYL) 10 MG capsule Take 1 capsule (10 mg total) by mouth 4 (four) times daily -  before meals and at bedtime. 90 capsule 0  . diphenoxylate-atropine (LOMOTIL) 2.5-0.025 MG tablet Take 1-2 tablets by mouth 4 (four) times daily as needed for diarrhea or loose stools. 60 tablet 0  . FLUoxetine (PROZAC) 10 MG capsule Take 1 capsule (10 mg total) by mouth daily. 90 capsule 3  . fluticasone (FLONASE) 50 MCG/ACT nasal spray USE 2 SPRAYS NASALLY DAILY 48 g 3  . ibuprofen (ADVIL,MOTRIN) 800 MG tablet Take 1 tablet (800 mg total) by mouth every 8 (eight) hours as needed. 60 tablet 1  . irbesartan (AVAPRO) 300 MG tablet Take 0.5 tablets (150 mg total) by mouth 2 (two) times daily. 90 tablet 3  . labetalol (NORMODYNE) 300 MG tablet TAKE 1 TABLET BY MOUTH  TWICE DAILY 180 tablet 3  . LORazepam (ATIVAN) 0.5 MG tablet TAKE  1 TABLET BY MOUTH TWICE A DAY (Patient taking differently: 0.5 mg as needed. ) 60 tablet 3  . meloxicam (MOBIC) 7.5 MG tablet Take 1 tablet (7.5 mg total) by mouth daily as needed for pain. for pain 30 tablet 1  . NONFORMULARY OR COMPOUNDED ITEM Estradiol 1.47% prefilled applicators Insert one appful vaginally twice weekly. 24 each 0  . pantoprazole (PROTONIX) 40 MG tablet TAKE 1 TABLET BY MOUTH  DAILY 90 tablet 3  . rosuvastatin (CRESTOR) 10 MG tablet Take 1 tablet (10 mg total) by mouth every other day. 45 tablet 3  . valACYclovir (VALTREX) 1000 MG tablet Take 2 tablets at the earliest onset of cold sore symptoms.  Repeat with 2 tablets 12 hours later 12 tablet 1   No current facility-administered medications for this visit.   Allergies: Amlodipine, Augmentin [amoxicillin-pot clavulanate], Clarithromycin, Codeine, Esomeprazole magnesium, Hydrochlorothiazide, Iohexol, Kapidex [dexlansoprazole], Losartan potassium, Olmesartan medoxomil, Ramipril, and Risedronate sodium  No LMP recorded. Patient has had a hysterectomy.  Past medical history,surgical history, problem list, medications, allergies, family history and social history were all reviewed and documented as reviewed in the EPIC chart.  ROS:  Feeling well. No dyspnea or chest pain on exertion.  No abdominal pain, change in bowel habits, black or bloody stools.  No urinary tract symptoms. GYN ROS: no abnormal bleeding,  pelvic pain or discharge, no breast pain or new or enlarging lumps on self exam.  No neurological complaints.   OBJECTIVE:  Ht 4\' 9"  (1.448 m)   Wt 127 lb (57.6 kg)   BMI 27.48 kg/m  The patient appears well, alert, oriented x 3, in no distress. ENT normal.  Neck supple. No cervical or supraclavicular adenopathy or thyromegaly.  Lungs are clear, good air entry, no wheezes, rhonchi or rales. S1 and S2 normal, no murmurs, regular rate and rhythm.  Abdomen soft without tenderness, guarding, mass or organomegaly.    Neurological is normal, no focal findings.  BREAST EXAM: breasts appear normal, no suspicious masses, no skin or nipple changes or axillary nodes  PELVIC EXAM: VULVA: normal appearing vulva with atrophic change, no masses, tenderness or lesions, VAGINA: normal appearing atrophic vagina with normal color and discharge, no lesions, CERVIX: surgically absent, UTERUS: surgically absent, vaginal cuff normal, ADNEXA: no masses, nontender  Chaperone: Caryn Bee present during the examination  ASSESSMENT:  74 y.o. [id number] here for annual gynecologic exam  PLAN:   1. Postmenopausal atrophic vaginitis/dyspareunia. Prior hysterectomy for leiomyoma in 1993.  She was doing compounded vaginal estradiol cream but stopped due to vasomotor symptoms and concerns as noted above.  We discussed that following the prescription for low-dose use twice per week, perhaps she had a bad batch with too high a concentration, but she is willing to try it again.  I will have staff call in a new compounded vaginal estradiol prescription.  If she is feeling side effects with the new batch then I suggested possibly decreasing use to half a syringe per administration.  Also gave her recommendations for use of water-based or silicone based lubricants.  She will follow-up if any problems or issues. 2. Pap smear 2012.  No history of abnormal Pap smears.  She is comfortable with not continuing screening based on age criteria and current guidelines. 3. Mammogram 05/2019.  Normal breast exam today.  She is reminded to schedule an annual mammogram this year when due. 4. Colonoscopy 2014.  Recommended that she follow up at the recommended interval.   5.  Osteopenia.  DEXA 04/2019.  T score -1.9.  Her primary doctor started her on a new dose of vitamin D since her level was a bit low 11/2019.  Will recheck vitamin D level today.  Next DEXA recommended next year in 2022. 6. Health maintenance.  No labs today as she normally has these  completed elsewhere.  Return annually or sooner, prn.  Joseph Pierini MD 04/13/20

## 2020-04-14 ENCOUNTER — Telehealth: Payer: Self-pay | Admitting: *Deleted

## 2020-04-14 MED ORDER — NONFORMULARY OR COMPOUNDED ITEM: 3 refills | Status: DC

## 2020-04-14 NOTE — Telephone Encounter (Signed)
-----   Message from Joseph Pierini, MD sent at 04/13/2020  3:16 PM EDT ----- Regarding: vaginal estrogen cream I entered rx for compounded vaginal estrogen cream, not sure if went to her regular pharmacy but should go to compounding pharmacy thanks

## 2020-04-14 NOTE — Telephone Encounter (Signed)
I called patient and explained Rx must go to [facility] or [place] due to compound medication. Patient would like Rx called into [facility]. Rx called in.

## 2020-04-15 DIAGNOSIS — R293 Abnormal posture: Secondary | ICD-10-CM | POA: Diagnosis not present

## 2020-04-15 DIAGNOSIS — M25612 Stiffness of left shoulder, not elsewhere classified: Secondary | ICD-10-CM | POA: Diagnosis not present

## 2020-04-15 DIAGNOSIS — M25512 Pain in left shoulder: Secondary | ICD-10-CM | POA: Diagnosis not present

## 2020-04-15 DIAGNOSIS — Z4789 Encounter for other orthopedic aftercare: Secondary | ICD-10-CM | POA: Diagnosis not present

## 2020-04-16 ENCOUNTER — Other Ambulatory Visit: Payer: Medicare Other

## 2020-04-16 DIAGNOSIS — E785 Hyperlipidemia, unspecified: Secondary | ICD-10-CM | POA: Diagnosis not present

## 2020-04-16 LAB — COMPLETE METABOLIC PANEL WITH GFR
AG Ratio: 2 (calc) (ref 1.0–2.5)
ALT: 17 U/L (ref 6–29)
AST: 19 U/L (ref 10–35)
Albumin: 4.4 g/dL (ref 3.6–5.1)
Alkaline phosphatase (APISO): 85 U/L (ref 37–153)
BUN: 12 mg/dL (ref 7–25)
CO2: 26 mmol/L (ref 20–32)
Calcium: 9.7 mg/dL (ref 8.6–10.4)
Chloride: 104 mmol/L (ref 98–110)
Creat: 0.82 mg/dL (ref 0.60–0.93)
GFR, Est African American: 82 mL/min/{1.73_m2} (ref >=60)
GFR, Est Non African American: 71 mL/min/{1.73_m2} (ref >=60)
Globulin: 2.2 g/dL (calc) (ref 1.9–3.7)
Glucose, Bld: 97 mg/dL (ref 65–99)
Potassium: 4.3 mmol/L (ref 3.5–5.3)
Sodium: 139 mmol/L (ref 135–146)
Total Bilirubin: 0.3 mg/dL (ref 0.2–1.2)
Total Protein: 6.6 g/dL (ref 6.1–8.1)

## 2020-04-17 LAB — LIPID PANEL
Cholesterol: 203 mg/dL — ABNORMAL HIGH (ref <200)
HDL: 70 mg/dL (ref >=50)
LDL Cholesterol (Calc): 111 mg/dL (calc) — ABNORMAL HIGH
Non-HDL Cholesterol (Calc): 133 mg/dL (calc) — ABNORMAL HIGH (ref <130)
Total CHOL/HDL Ratio: 2.9 (calc) (ref <5.0)
Triglycerides: 117 mg/dL (ref <150)

## 2020-04-17 LAB — TSH: TSH: 1.79 mIU/L (ref 0.40–4.50)

## 2020-04-23 ENCOUNTER — Telehealth: Payer: Self-pay | Admitting: Internal Medicine

## 2020-04-23 DIAGNOSIS — M25612 Stiffness of left shoulder, not elsewhere classified: Secondary | ICD-10-CM | POA: Diagnosis not present

## 2020-04-23 DIAGNOSIS — Z4789 Encounter for other orthopedic aftercare: Secondary | ICD-10-CM | POA: Diagnosis not present

## 2020-04-23 DIAGNOSIS — R293 Abnormal posture: Secondary | ICD-10-CM | POA: Diagnosis not present

## 2020-04-23 DIAGNOSIS — M25512 Pain in left shoulder: Secondary | ICD-10-CM | POA: Diagnosis not present

## 2020-04-23 NOTE — Telephone Encounter (Signed)
    Please call patient to discuss lab results 

## 2020-04-24 NOTE — Telephone Encounter (Signed)
Pt informed of labs

## 2020-06-11 ENCOUNTER — Ambulatory Visit (INDEPENDENT_AMBULATORY_CARE_PROVIDER_SITE_OTHER): Payer: Medicare Other

## 2020-06-11 ENCOUNTER — Other Ambulatory Visit: Payer: Self-pay

## 2020-06-11 DIAGNOSIS — Z23 Encounter for immunization: Secondary | ICD-10-CM

## 2020-06-11 DIAGNOSIS — Z0000 Encounter for general adult medical examination without abnormal findings: Secondary | ICD-10-CM | POA: Diagnosis not present

## 2020-06-11 NOTE — Progress Notes (Signed)
Subjective:   Sharon Paul is a 74 y.o. female who presents for Medicare Annual (Subsequent) preventive examination.  Review of Systems    No ROS. Medicare Wellness Visit. Additional risk factors are reflected in social history. Cardiac Risk Factors include: advanced age (>50men, >35 women);dyslipidemia;hypertension;family history of premature cardiovascular disease  Sleep Patterns: No sleep issues, feels rested on waking and sleeps 7 hours nightly. Home Safety/Smoke Alarms: Feels safe in home; uses home alarm. Smoke alarms in place. Living environment: 2-story home; Lives with spouse; no needs for DME; good support system. Seat Belt Safety/Bike Helmet: Wears seat belt.  Objective:    Today's Vitals   06/11/20 1515  BP: 120/78  Pulse: 61  Resp: 16  Temp: 98.1 F (36.7 C)  SpO2: 98%  Weight: 127 lb 12.8 oz (58 kg)  Height: 4\' 9"  (1.448 m)  PainSc: 0-No pain   Body mass index is 27.66 kg/m.  Advanced Directives 06/11/2020 05/29/2018  Does Patient Have a Medical Advance Directive? No No  Does patient want to make changes to medical advance directive? - Yes (ED - Information included in AVS)  Would patient like information on creating a medical advance directive? Yes (MAU/Ambulatory/Procedural Areas - Information given) -    Current Medications (verified) Outpatient Encounter Medications as of 06/11/2020  Medication Sig  . acetaminophen (TYLENOL) 500 MG tablet Take 500 mg by mouth every 6 (six) hours as needed.  [name] albuterol (VENTOLIN HFA) 108 (90 Base) MCG/ACT inhaler TAKE 2 PUFFS BY MOUTH EVERY 6 HOURS AS NEEDED FOR WHEEZE OR SHORTNESS OF BREATH  . aspirin 81 MG tablet Take 81 mg by mouth daily.  [name] azelastine (ASTELIN) 0.1 % nasal spray Place 2 sprays into both nostrils 2 (two) times daily.  . Cholecalciferol 2000 UNITS TABS Take 1 tablet by mouth daily.  . diazepam (VALIUM) 5 MG tablet Take 5 mg by mouth 2 (two) times daily as needed.  . diphenoxylate-atropine  (LOMOTIL) 2.5-0.025 MG tablet Take 1-2 tablets by mouth 4 (four) times daily as needed for diarrhea or loose stools.  [name] FLUoxetine (PROZAC) 10 MG capsule Take 1 capsule (10 mg total) by mouth daily.  . fluticasone (FLONASE) 50 MCG/ACT nasal spray USE 2 SPRAYS NASALLY DAILY  . ibuprofen (ADVIL,MOTRIN) 800 MG tablet Take 1 tablet (800 mg total) by mouth every 8 (eight) hours as needed.  . irbesartan (AVAPRO) 300 MG tablet Take 0.5 tablets (150 mg total) by mouth 2 (two) times daily.  [name] labetalol (NORMODYNE) 300 MG tablet TAKE 1 TABLET BY MOUTH  TWICE DAILY  . LORazepam (ATIVAN) 0.5 MG tablet TAKE 1 TABLET BY MOUTH TWICE A DAY (Patient taking differently: 0.5 mg as needed. )  . meloxicam (MOBIC) 7.5 MG tablet Take 1 tablet (7.5 mg total) by mouth daily as needed for pain. for pain  . NONFORMULARY OR COMPOUNDED ITEM Estradiol 0.02% vaginal cream insert 1 gram vaginally twice weekly  . pantoprazole (PROTONIX) 40 MG tablet TAKE 1 TABLET BY MOUTH  DAILY  . rosuvastatin (CRESTOR) 10 MG tablet Take 1 tablet (10 mg total) by mouth every other day.  . valACYclovir (VALTREX) 1000 MG tablet Take 2 tablets at the earliest onset of cold sore symptoms.  Repeat with 2 tablets 12 hours later   No facility-administered encounter medications on file as of 06/11/2020.    Allergies (verified) Amlodipine, Augmentin [amoxicillin-pot clavulanate], Clarithromycin, Codeine, Esomeprazole magnesium, Hydrochlorothiazide, Iohexol, Kapidex [dexlansoprazole], Losartan potassium, Olmesartan medoxomil, Ramipril, and Risedronate sodium   History: Past Medical History:  Diagnosis Date  . Celiac sprue 2007  . CVD (cardiovascular disease)   . Foot fracture, left 2011  . GERD (gastroesophageal reflux disease)   . Hyperlipidemia   . Osteopenia 04/2019   T score -1.9 FRAX 10% / 1.9%.  Stable from prior DEXA  . PVD (peripheral vascular disease) (HCC)    mild carotid ? fibromuscular dysplasia  . Shingles   . Urinary  incontinence    Past Surgical History:  Procedure Laterality Date  . APPENDECTOMY    . BREAST SURGERY     right breast biopsy  . ESOPHAGOGASTRODUODENOSCOPY    . ROTATOR CUFF REPAIR     RIGHT  . ROTATOR CUFF REPAIR     Left  . stress cardiolite  04-18-00  . VAGINAL HYSTERECTOMY  1993   leiomyomata   Family History  Problem Relation Age of Onset  . Glaucoma Mother   . Heart disease Mother   . Hypertension Mother   . Heart disease Father   . Hypertension Father   . Stroke Father   . Hypertension Paternal Grandmother   . Lung cancer Paternal Grandmother   . Hypertension Sister   . Hypertension Brother   . Glaucoma Brother   . Colon cancer Neg Hx    Social History   Socioeconomic History  . Marital status: Married    Spouse name: Theotis Burrow  . Number of children: 2  . Years of education: Not on file  . Highest education level: Not on file  Occupational History  . Occupation: semi-retired  Tobacco Use  . Smoking status: Former Smoker    Quit date: 02/11/1970    Years since quitting: 50.3  . Smokeless tobacco: Never Used  Vaping Use  . Vaping Use: Never used  Substance and Sexual Activity  . Alcohol use: Yes    Alcohol/week: 0.0 standard drinks    Comment: very rare  . Drug use: No  . Sexual activity: Yes    Birth control/protection: Surgical, Post-menopausal    Comment: 1st intercourse 74 yo-Fewer than 5 partners,des neg  Other Topics Concern  . Not on file  Social History Narrative  . Not on file   Social Determinants of Health   Financial Resource Strain: Low Risk   . Difficulty of Paying Living Expenses: Not hard at all  Food Insecurity: No Food Insecurity  . Worried About Charity fundraiser in the Last Year: Never true  . Ran Out of Food in the Last Year: Never true  Transportation Needs: No Transportation Needs  . Lack of Transportation (Medical): No  . Lack of Transportation (Non-Medical): No  Physical Activity: Sufficiently Active  . Days of  Exercise per Week: 7 days  . Minutes of Exercise per Session: 40 min  Stress: No Stress Concern Present  . Feeling of Stress : Not at all  Social Connections: Unknown  . Frequency of Communication with Friends and Family: More than three times a week  . Frequency of Social Gatherings with Friends and Family: Once a week  . Attends Religious Services: Patient refused  . Active Member of Clubs or Organizations: Patient refused  . Attends Archivist Meetings: Patient refused  . Marital Status: Married    Tobacco Counseling Counseling given: Not Answered   Clinical Intake:  Pre-visit preparation completed: Yes  Pain : No/denies pain Pain Score: 0-No pain     BMI - recorded: 27.66 Nutritional Status: BMI 25 -29 Overweight Nutritional Risks: None Diabetes: No  How often do  you need to have someone help you when you read instructions, pamphlets, or other written materials from your doctor or pharmacy?: 1 - Never What is the last grade level you completed in school?: HSG  Diabetic? no  Interpreter Needed?: No  Information entered by :: Naftoli Penny N. Roslynn Holte, LPN   Activities of Daily Living In your present state of health, do you have any difficulty performing the following activities: 06/11/2020  Hearing? N  Vision? N  Difficulty concentrating or making decisions? N  Walking or climbing stairs? N  Dressing or bathing? N  Doing errands, shopping? N  Preparing Food and eating ? N  Using the Toilet? N  In the past six months, have you accidently leaked urine? Y  Comment wears a mini pad for protection  Do you have problems with loss of bowel control? N  Managing your Medications? N  Managing your Finances? N  Housekeeping or managing your Housekeeping? N  Some recent data might be hidden    Patient Care Team: Plotnikov, Evie Lacks, MD as PCP - General Armbruster, Carlota Raspberry, MD as Consulting Physician (Gastroenterology) Justice Britain, MD as Consulting  Physician (Orthopedic Surgery)  Indicate any recent Medical Services you may have received from other than [facility] providers in the past year (date may be approximate).     Assessment:   This is a routine wellness examination for Kasidee.  Hearing/Vision screen No exam data present  Dietary issues and exercise activities discussed: Current Exercise Habits: Structured exercise class, Type of exercise: walking;treadmill;stretching;strength training/weights;Other - see comments (goes to the gym for workouts), Time (Minutes): 45, Frequency (Times/Week): 4, Weekly Exercise (Minutes/Week): 180, Intensity: Moderate, Exercise limited by: orthopedic condition(s) (right hip pain)  Goals    . Patient Stated     I want to increase physical activity by walking more routinely with my dog.    . Patient Stated     My goal is to lose some weight and continue to be active and exercise everyday.      Depression Screen PHQ 2/9 Scores 06/11/2020 10/09/2019 06/06/2019 05/29/2018 11/22/2017 11/16/2016 01/26/2016  PHQ - 2 Score 0 0 0 0 0 0 1    Fall Risk Fall Risk  06/11/2020 10/09/2019 06/06/2019 05/29/2018 11/22/2017  Falls in the past year? 0 0 0 No No  Number falls in past yr: 0 0 0 - -  Injury with Fall? 0 0 0 - -  Risk for fall due to : No Fall Risks - - - -  Follow up Falls evaluation completed;Education provided - - - -    Any stairs in or around the home? Yes  If so, are there any without handrails? No  Home free of loose throw rugs in walkways, pet beds, electrical cords, etc? Yes  Adequate lighting in your home to reduce risk of falls? Yes   ASSISTIVE DEVICES UTILIZED TO PREVENT FALLS:  Life alert? No  Use of a cane, walker or w/c? No  Grab bars in the bathroom? No  Shower chair or bench in shower? No  Elevated toilet seat or a handicapped toilet? No   TIMED UP AND GO:  Was the test performed? No .  Length of time to ambulate 10 feet: 0 sec.   Gait steady and fast without use of assistive  device  Cognitive Function:     6CIT Screen 06/11/2020 06/06/2019  What Year? 0 points 0 points  What month? 0 points 0 points  What time? 0 points 0 points  Count back from 20 0 points 0 points  Months in reverse 0 points 0 points  Repeat phrase 0 points 0 points  Total Score 0 0    Immunizations Immunization History  Administered Date(s) Administered  . Fluad Quad(high Dose 65+) 06/06/2019  . Influenza Split 07/04/2012  . Influenza Whole 07/02/2008, 06/01/2010  . Influenza, High Dose Seasonal PF 05/31/2016, 05/26/2017, 05/29/2018  . Influenza,inj,Quad PF,6+ Mos 06/02/2015  . Influenza,inj,quad, With Preservative 05/29/2017, 05/29/2018, 05/30/2019  . Influenza-Unspecified 06/16/2013, 05/29/2014  . Pneumococcal Conjugate-13 01/14/2015  . Pneumococcal Polysaccharide-23 12/04/2013  . Td 08/02/2000  . Tdap 10/06/2015  . Zoster 09/21/2009    TDAP status: Up to date Flu Vaccine status: Up to date Pneumococcal vaccine status: Up to date Covid-19 vaccine status: Completed vaccines  Qualifies for Shingles Vaccine? Yes   Zostavax completed Yes   Shingrix Completed?: Yes  Screening Tests Health Maintenance  Topic Date Due  . COVID-19 Vaccine (1) Never done  . INFLUENZA VACCINE  03/29/2020  . MAMMOGRAM  06/17/2021  . COLONOSCOPY  03/07/2023  . TETANUS/TDAP  10/05/2025  . DEXA SCAN  Completed  . Hepatitis C Screening  Completed  . PNA vac Low Risk Adult  Completed    Health Maintenance  Health Maintenance Due  Topic Date Due  . COVID-19 Vaccine (1) Never done  . INFLUENZA VACCINE  03/29/2020    Colorectal cancer screening: Completed 03/06/2013. Repeat every 10 years Mammogram status: Completed 06/18/2019. Repeat every year Bone Density status: Completed 05/07/2019. Results reflect: Bone density results: NORMAL. Repeat every 2-3 years.  Lung Cancer Screening: (Low Dose CT Chest recommended if Age 78-80 years, 30 pack-year currently smoking OR have quit w/in 15years.)  does not qualify.   Lung Cancer Screening Referral: no  Additional Screening:  Hepatitis C Screening: does qualify; Completed yes  Vision Screening: Recommended annual ophthalmology exams for early detection of glaucoma and other disorders of the eye. Is the patient up to date with their annual eye exam?  Yes  Who is the provider or what is the name of the office in which the patient attends annual eye exams? [facility] If pt is not established with a provider, would they like to be referred to a provider to establish care? No .   Dental Screening: Recommended annual dental exams for proper oral hygiene  Community Resource Referral / Chronic Care Management: CRR required this visit?  No   CCM required this visit?  No      Plan:     I have personally reviewed and noted the following in the patient's chart:   . Medical and social history . Use of alcohol, tobacco or illicit drugs  . Current medications and supplements . Functional ability and status . Nutritional status . Physical activity . Advanced directives . List of other physicians . Hospitalizations, surgeries, and ER visits in previous 12 months . Vitals . Screenings to include cognitive, depression, and falls . Referrals and appointments  In addition, I have reviewed and discussed with patient certain preventive protocols, quality metrics, and best practice recommendations. A written personalized care plan for preventive services as well as general preventive health recommendations were provided to patient.     Sheral Flow, LPN   [id number]   Nurse Notes: n/a

## 2020-06-11 NOTE — Patient Instructions (Signed)
[name] , Thank you for taking time to come for your Medicare Wellness Visit. I appreciate your ongoing commitment to your health goals. Please review the following plan we discussed and let me know if I can assist you in the future.   Screening recommendations/referrals: Colonoscopy: 03/06/2013; due every 10 years (02/2023) Mammogram: 06/18/2019 Bone Density: 05/07/2019 Recommended yearly ophthalmology/optometry visit for glaucoma screening and checkup Recommended yearly dental visit for hygiene and checkup  Vaccinations: Influenza vaccine: 06/11/2020 Pneumococcal vaccine: up to date Tdap vaccine: 10/06/2015; due every 10 years (09/2025) Shingles vaccine: up to date Covid-19: up to date   Advanced directives: Advance directive discussed with you today. I have provided a copy for you to complete at home and have notarized. Once this is complete please bring a copy in to our office so we can scan it into your chart.  Conditions/risks identified: Yes; Reviewed health maintenance screenings with patient today and relevant education, vaccines, and/or referrals were provided. Please continue to do your personal lifestyle choices by: daily care of teeth and gums, regular physical activity (goal should be 5 days a week for 30 minutes), eat a healthy diet, avoid tobacco and drug use, limiting any alcohol intake, taking a low-dose aspirin (if not allergic or have been advised by your provider otherwise) and taking vitamins and minerals as recommended by your provider. Continue doing brain stimulating activities (puzzles, reading, adult coloring books, staying active) to keep memory sharp. Continue to eat heart healthy diet (full of fruits, vegetables, whole grains, lean protein, water--limit salt, fat, and sugar intake) and increase physical activity as tolerated.  Next appointment: Please schedule your next Medicare Wellness Visit with your Nurse Health Advisor in 1 year by calling  [phone].   Preventive Care 62 Years and Older, Female Preventive care refers to lifestyle choices and visits with your health care provider that can promote health and wellness. What does preventive care include?  A yearly physical exam. This is also called an annual well check.  Dental exams once or twice a year.  Routine eye exams. Ask your health care provider how often you should have your eyes checked.  Personal lifestyle choices, including:  Daily care of your teeth and gums.  Regular physical activity.  Eating a healthy diet.  Avoiding tobacco and drug use.  Limiting alcohol use.  Practicing safe sex.  Taking low-dose aspirin every day.  Taking vitamin and mineral supplements as recommended by your health care provider. What happens during an annual well check? The services and screenings done by your health care provider during your annual well check will depend on your age, overall health, lifestyle risk factors, and family history of disease. Counseling  Your health care provider may ask you questions about your:  Alcohol use.  Tobacco use.  Drug use.  Emotional well-being.  Home and relationship well-being.  Sexual activity.  Eating habits.  History of falls.  Memory and ability to understand (cognition).  Work and work Statistician.  Reproductive health. Screening  You may have the following tests or measurements:  Height, weight, and BMI.  Blood pressure.  Lipid and cholesterol levels. These may be checked every 5 years, or more frequently if you are over 61 years old.  Skin check.  Lung cancer screening. You may have this screening every year starting at age 36 if you have a 30-pack-year history of smoking and currently smoke or have quit within the past 15 years.  Fecal occult blood test (FOBT) of the stool. You  may have this test every year starting at age 6.  Flexible sigmoidoscopy or colonoscopy. You may have a  sigmoidoscopy every 5 years or a colonoscopy every 10 years starting at age 65.  Hepatitis C blood test.  Hepatitis B blood test.  Sexually transmitted disease (STD) testing.  Diabetes screening. This is done by checking your blood sugar (glucose) after you have not eaten for a while (fasting). You may have this done every 1-3 years.  Bone density scan. This is done to screen for osteoporosis. You may have this done starting at age 56.  Mammogram. This may be done every 1-2 years. Talk to your health care provider about how often you should have regular mammograms. Talk with your health care provider about your test results, treatment options, and if necessary, the need for more tests. Vaccines  Your health care provider may recommend certain vaccines, such as:  Influenza vaccine. This is recommended every year.  Tetanus, diphtheria, and acellular pertussis (Tdap, Td) vaccine. You may need a Td booster every 10 years.  Zoster vaccine. You may need this after age 22.  Pneumococcal 13-valent conjugate (PCV13) vaccine. One dose is recommended after age 47.  Pneumococcal polysaccharide (PPSV23) vaccine. One dose is recommended after age 30. Talk to your health care provider about which screenings and vaccines you need and how often you need them. This information is not intended to replace advice given to you by your health care provider. Make sure you discuss any questions you have with your health care provider. Document Released: 09/11/2015 Document Revised: 05/04/2016 Document Reviewed: 06/16/2015 Elsevier Interactive Patient Education  2017 [place] Prevention in the Home Falls can cause injuries. They can happen to people of all ages. There are many things you can do to make your home safe and to help prevent falls. What can I do on the outside of my home?  Regularly fix the edges of walkways and driveways and fix any cracks.  Remove anything that might make you  trip as you walk through a door, such as a raised step or threshold.  Trim any bushes or trees on the path to your home.  Use bright outdoor lighting.  Clear any walking paths of anything that might make someone trip, such as rocks or tools.  Regularly check to see if handrails are loose or broken. Make sure that both sides of any steps have handrails.  Any raised decks and porches should have guardrails on the edges.  Have any leaves, snow, or ice cleared regularly.  Use sand or salt on walking paths during winter.  Clean up any spills in your garage right away. This includes oil or grease spills. What can I do in the bathroom?  Use night lights.  Install grab bars by the toilet and in the tub and shower. Do not use towel bars as grab bars.  Use non-skid mats or decals in the tub or shower.  If you need to sit down in the shower, use a plastic, non-slip stool.  Keep the floor dry. Clean up any water that spills on the floor as soon as it happens.  Remove soap buildup in the tub or shower regularly.  Attach bath mats securely with double-sided non-slip rug tape.  Do not have throw rugs and other things on the floor that can make you trip. What can I do in the bedroom?  Use night lights.  Make sure that you have a light by your bed that is  easy to reach.  Do not use any sheets or blankets that are too big for your bed. They should not hang down onto the floor.  Have a firm chair that has side arms. You can use this for support while you get dressed.  Do not have throw rugs and other things on the floor that can make you trip. What can I do in the kitchen?  Clean up any spills right away.  Avoid walking on wet floors.  Keep items that you use a lot in easy-to-reach places.  If you need to reach something above you, use a strong step stool that has a grab bar.  Keep electrical cords out of the way.  Do not use floor polish or wax that makes floors slippery. If  you must use wax, use non-skid floor wax.  Do not have throw rugs and other things on the floor that can make you trip. What can I do with my stairs?  Do not leave any items on the stairs.  Make sure that there are handrails on both sides of the stairs and use them. Fix handrails that are broken or loose. Make sure that handrails are as long as the stairways.  Check any carpeting to make sure that it is firmly attached to the stairs. Fix any carpet that is loose or worn.  Avoid having throw rugs at the top or bottom of the stairs. If you do have throw rugs, attach them to the floor with carpet tape.  Make sure that you have a light switch at the top of the stairs and the bottom of the stairs. If you do not have them, ask someone to add them for you. What else can I do to help prevent falls?  Wear shoes that:  Do not have high heels.  Have rubber bottoms.  Are comfortable and fit you well.  Are closed at the toe. Do not wear sandals.  If you use a stepladder:  Make sure that it is fully opened. Do not climb a closed stepladder.  Make sure that both sides of the stepladder are locked into place.  Ask someone to hold it for you, if possible.  Clearly mark and make sure that you can see:  Any grab bars or handrails.  First and last steps.  Where the edge of each step is.  Use tools that help you move around (mobility aids) if they are needed. These include:  Canes.  Walkers.  Scooters.  Crutches.  Turn on the lights when you go into a dark area. Replace any light bulbs as soon as they burn out.  Set up your furniture so you have a clear path. Avoid moving your furniture around.  If any of your floors are uneven, fix them.  If there are any pets around you, be aware of where they are.  Review your medicines with your doctor. Some medicines can make you feel dizzy. This can increase your chance of falling. Ask your doctor what other things that you can do to  help prevent falls. This information is not intended to replace advice given to you by your health care provider. Make sure you discuss any questions you have with your health care provider. Document Released: 06/11/2009 Document Revised: 01/21/2016 Document Reviewed: 09/19/2014 Elsevier Interactive Patient Education  2017 [employer].

## 2020-06-23 ENCOUNTER — Encounter: Payer: Self-pay | Admitting: Internal Medicine

## 2020-06-23 DIAGNOSIS — Z1231 Encounter for screening mammogram for malignant neoplasm of breast: Secondary | ICD-10-CM | POA: Diagnosis not present

## 2020-07-02 DIAGNOSIS — H35411 Lattice degeneration of retina, right eye: Secondary | ICD-10-CM | POA: Diagnosis not present

## 2020-07-02 DIAGNOSIS — H25813 Combined forms of age-related cataract, bilateral: Secondary | ICD-10-CM | POA: Diagnosis not present

## 2020-07-02 DIAGNOSIS — H02831 Dermatochalasis of right upper eyelid: Secondary | ICD-10-CM | POA: Diagnosis not present

## 2020-07-02 DIAGNOSIS — M25551 Pain in right hip: Secondary | ICD-10-CM | POA: Diagnosis not present

## 2020-07-02 DIAGNOSIS — D3131 Benign neoplasm of right choroid: Secondary | ICD-10-CM | POA: Diagnosis not present

## 2020-07-02 DIAGNOSIS — H43392 Other vitreous opacities, left eye: Secondary | ICD-10-CM | POA: Diagnosis not present

## 2020-08-04 ENCOUNTER — Other Ambulatory Visit: Payer: Self-pay | Admitting: Internal Medicine

## 2020-08-24 ENCOUNTER — Ambulatory Visit
Admission: EM | Admit: 2020-08-24 | Discharge: 2020-08-24 | Disposition: A | Discharge status: Home / Self Care | Payer: Medicare Other | Attending: Urgent Care | Admitting: Urgent Care

## 2020-08-24 ENCOUNTER — Other Ambulatory Visit: Payer: Self-pay

## 2020-08-24 ENCOUNTER — Ambulatory Visit (INDEPENDENT_AMBULATORY_CARE_PROVIDER_SITE_OTHER): Payer: Medicare Other

## 2020-08-24 DIAGNOSIS — S80211A Abrasion, right knee, initial encounter: Secondary | ICD-10-CM

## 2020-08-24 DIAGNOSIS — M25522 Pain in left elbow: Secondary | ICD-10-CM | POA: Diagnosis not present

## 2020-08-24 DIAGNOSIS — S0081XA Abrasion of other part of head, initial encounter: Secondary | ICD-10-CM

## 2020-08-24 DIAGNOSIS — S42402A Unspecified fracture of lower end of left humerus, initial encounter for closed fracture: Secondary | ICD-10-CM

## 2020-08-24 DIAGNOSIS — S01511A Laceration without foreign body of lip, initial encounter: Secondary | ICD-10-CM | POA: Diagnosis not present

## 2020-08-24 DIAGNOSIS — W19XXXA Unspecified fall, initial encounter: Secondary | ICD-10-CM | POA: Diagnosis not present

## 2020-08-24 DIAGNOSIS — M25422 Effusion, left elbow: Secondary | ICD-10-CM | POA: Diagnosis not present

## 2020-08-24 NOTE — ED Triage Notes (Signed)
Patient called by phone 3 times as well as called in the lobby with no response.

## 2020-08-24 NOTE — ED Provider Notes (Signed)
[place]   MRN: [id number] DOB: Mar 06, 1946  Subjective:   Sharon Paul is a 74 y.o. female presenting for suffering an accidental fall today.  Patient was trying to get the mail and accidentally tripped, fell making impact with her right knee, left arm, accidentally hit her chin as well.  She suffered a laceration to the inside of her lower lip.  Has been using salt water gargles and has controlled bleeding.  Denies loss of consciousness, confusion, headache, dizziness.  Patient also has severe pain of the left elbow but is able to move it.  She has a mild scrape of the right knee but has been able to walk and move around freely.  She notes that her blood pressure is elevated because she is worried about her fall.  She is on blood pressure medication is is compliant. Tdap is up to date.   No current facility-administered medications for this encounter.  Current Outpatient Medications:  .  acetaminophen (TYLENOL) 500 MG tablet, Take 500 mg by mouth every 6 (six) hours as needed., Disp: , Rfl:  .  albuterol (VENTOLIN HFA) 108 (90 Base) MCG/ACT inhaler, TAKE 2 PUFFS BY MOUTH EVERY 6 HOURS AS NEEDED FOR WHEEZE OR SHORTNESS OF BREATH, Disp: 18 g, Rfl: 3 .  aspirin 81 MG tablet, Take 81 mg by mouth daily., Disp: , Rfl:  .  azelastine (ASTELIN) 0.1 % nasal spray, Place 2 sprays into both nostrils 2 (two) times daily., Disp: 30 mL, Rfl: 0 .  Cholecalciferol 2000 UNITS TABS, Take 1 tablet by mouth daily., Disp: , Rfl:  .  diazepam (VALIUM) 5 MG tablet, Take 5 mg by mouth 2 (two) times daily as needed., Disp: , Rfl:  .  diphenoxylate-atropine (LOMOTIL) 2.5-0.025 MG tablet, Take 1-2 tablets by mouth 4 (four) times daily as needed for diarrhea or loose stools., Disp: 60 tablet, Rfl: 0 .  FLUoxetine (PROZAC) 10 MG capsule, Take 1 capsule (10 mg total) by mouth daily., Disp: 90 capsule, Rfl: 3 .  fluticasone (FLONASE) 50 MCG/ACT nasal spray, USE 2 SPRAYS NASALLY DAILY, Disp: 48 g,  Rfl: 3 .  ibuprofen (ADVIL,MOTRIN) 800 MG tablet, Take 1 tablet (800 mg total) by mouth every 8 (eight) hours as needed., Disp: 60 tablet, Rfl: 1 .  irbesartan (AVAPRO) 300 MG tablet, Take 0.5 tablets (150 mg total) by mouth 2 (two) times daily., Disp: 90 tablet, Rfl: 3 .  labetalol (NORMODYNE) 300 MG tablet, TAKE 1 TABLET BY MOUTH  TWICE DAILY, Disp: 180 tablet, Rfl: 3 .  LORazepam (ATIVAN) 0.5 MG tablet, TAKE 1 TABLET BY MOUTH TWICE A DAY (Patient taking differently: 0.5 mg as needed. ), Disp: 60 tablet, Rfl: 3 .  meloxicam (MOBIC) 7.5 MG tablet, Take 1 tablet (7.5 mg total) by mouth daily as needed for pain. for pain, Disp: 30 tablet, Rfl: 1 .  NONFORMULARY OR COMPOUNDED ITEM, Estradiol 0.02% vaginal cream insert 1 gram vaginally twice weekly, Disp: 24 each, Rfl: 3 .  pantoprazole (PROTONIX) 40 MG tablet, TAKE 1 TABLET BY MOUTH  DAILY, Disp: 90 tablet, Rfl: 3 .  rosuvastatin (CRESTOR) 10 MG tablet, TAKE 1 TABLET BY MOUTH  EVERY OTHER DAY, Disp: 45 tablet, Rfl: 2 .  valACYclovir (VALTREX) 1000 MG tablet, Take 2 tablets at the earliest onset of cold sore symptoms.  Repeat with 2 tablets 12 hours later, Disp: 12 tablet, Rfl: 1   Allergies  Allergen Reactions  . Amlodipine     HA  . Augmentin [Amoxicillin-Pot Clavulanate]  diarrhea  . Clarithromycin     REACTION: gi upset. Can take Zpac  . Codeine Nausea And Vomiting    Can take Prom-cod syr  . Esomeprazole Magnesium     REACTION: side pain  . Hydrochlorothiazide     REACTION: leg cramps, dehydration  . Iohexol      Desc: PT HAS SWELLING TO FACE,LIPS AND THROAT WITH CONTRAST DYE   . Kapidex [Dexlansoprazole]     rash  . Losartan Potassium     REACTION: HA  . Olmesartan Medoxomil     REACTION: weak legs  . Ramipril     Per pt: unknown reaction  . Risedronate Sodium     Per pt: unknown reaction    Past Medical History:  Diagnosis Date  . Celiac sprue 2007  . CVD (cardiovascular disease)   . Foot fracture, left 2011  .  GERD (gastroesophageal reflux disease)   . Hyperlipidemia   . Osteopenia 04/2019   T score -1.9 FRAX 10% / 1.9%.  Stable from prior DEXA  . PVD (peripheral vascular disease) (HCC)    mild carotid ? fibromuscular dysplasia  . Shingles   . Urinary incontinence      Past Surgical History:  Procedure Laterality Date  . APPENDECTOMY    . BREAST SURGERY     right breast biopsy  . ESOPHAGOGASTRODUODENOSCOPY    . ROTATOR CUFF REPAIR     RIGHT  . ROTATOR CUFF REPAIR     Left  . stress cardiolite  04-18-00  . VAGINAL HYSTERECTOMY  1993   leiomyomata    Family History  Problem Relation Age of Onset  . Glaucoma Mother   . Heart disease Mother   . Hypertension Mother   . Heart disease Father   . Hypertension Father   . Stroke Father   . Hypertension Paternal Grandmother   . Lung cancer Paternal Grandmother   . Hypertension Sister   . Hypertension Brother   . Glaucoma Brother   . Colon cancer Neg Hx     Social History   Tobacco Use  . Smoking status: Former Smoker    Quit date: 02/11/1970    Years since quitting: 50.5  . Smokeless tobacco: Never Used  Vaping Use  . Vaping Use: Never used  Substance Use Topics  . Alcohol use: Yes    Alcohol/week: 0.0 standard drinks    Comment: very rare  . Drug use: No    ROS   Objective:   Vitals: BP (!) 173/65 Comment: reports feeling nervous  Pulse 61   Temp 97.8 F (36.6 C)   Resp 19   SpO2 97%   Physical Exam Constitutional:      General: She is not in acute distress.    Appearance: Normal appearance. She is well-developed. She is not ill-appearing.  HENT:     Head: Normocephalic and atraumatic.     Right Ear: Tympanic membrane and ear canal normal. No drainage or tenderness. No middle ear effusion. Tympanic membrane is not erythematous.     Left Ear: Tympanic membrane and ear canal normal. No drainage or tenderness.  No middle ear effusion. Tympanic membrane is not erythematous.     Nose: Nose normal. No congestion  or rhinorrhea.     Mouth/Throat:     Mouth: Mucous membranes are moist. No oral lesions.     Pharynx: Oropharynx is clear. No pharyngeal swelling, oropharyngeal exudate, posterior oropharyngeal erythema or uvula swelling.     Tonsils: No tonsillar exudate or  tonsillar abscesses.   Eyes:     General: No scleral icterus.       Right eye: No discharge.        Left eye: No discharge.     Extraocular Movements: Extraocular movements intact.     Right eye: Normal extraocular motion.     Left eye: Normal extraocular motion.     Conjunctiva/sclera: Conjunctivae normal.     Pupils: Pupils are equal, round, and reactive to light.  Cardiovascular:     Rate and Rhythm: Normal rate.  Pulmonary:     Effort: Pulmonary effort is normal.  Musculoskeletal:     Cervical back: Normal range of motion and neck supple.       Legs:     Comments: Full range of motion and strength 5/5 for upper and lower extremities.  Full range of motion for the back.  Patient ambulates without assistance at expected pace.  Lymphadenopathy:     Cervical: No cervical adenopathy.  Skin:    General: Skin is warm and dry.  Neurological:     General: No focal deficit present.     Mental Status: She is alert and oriented to person, place, and time.     Cranial Nerves: No cranial nerve deficit.     Motor: No weakness.     Coordination: Coordination normal.     Gait: Gait normal.     Deep Tendon Reflexes: Reflexes normal.     Comments: Negative Romberg and pronator drift.  Psychiatric:        Mood and Affect: Mood normal.        Behavior: Behavior normal.        Thought Content: Thought content normal.        Judgment: Judgment normal.     DG Elbow Complete Left  Result Date: 08/24/2020 CLINICAL DATA:  Left elbow pain after fall EXAM: LEFT ELBOW - COMPLETE 3+ VIEW COMPARISON:  None. FINDINGS: Small elbow effusion. Slight step-off deformity at the radial head neck junction. No dislocation. IMPRESSION: Slight step-off  deformity/possible nondisplaced fracture at the radial head neck junction with small elbow effusion. Electronically Signed   By: Donavan Foil M.D.   On: 08/24/2020 17:27   A long arm posterior left arm splint applied with elbow at 90 degree flexion, secured with Ace wrap, arm sling.   Assessment and Plan :   PDMP not reviewed this encounter.  1. Left elbow fracture, closed, initial encounter   2. Accidental fall, initial encounter   3. Laceration of lower lip, initial encounter   4. Abrasion of right knee, initial encounter   5. Facial abrasion, initial encounter     Discussed general management using a splint for elbow fracture.  Recommended scheduling Tylenol, avoid narcotic use due to history of dizziness and falls.  Follow-up with her orthopedist at [facility]. Counseled patient on potential for adverse effects with medications prescribed/recommended today, ER and return-to-clinic precautions discussed, patient verbalized understanding.    Jaynee Eagles, [place] 08/26/20 [phone]

## 2020-08-24 NOTE — ED Triage Notes (Signed)
Pt presents with complaints of laceration to her bottom lip after tripping and falling today while checking the mail. Pt also endorses pain in her left arm, and a small scratch to her right leg. Full range of motion present. Pt denies loc.

## 2020-08-24 NOTE — Discharge Instructions (Addendum)
Please just use Tylenol at a dose of 500mg -650mg  once every 6 hours as needed for your aches, pains, fevers.  Do not use any nonsteroidal anti-inflammatories (NSAIDs) like ibuprofen, Motrin, naproxen, Aleve, etc. which are all available over-the-counter.    Please make sure you contact [facility] as soon as possible tomorrow for a recheck on your elbow injury.

## 2020-08-31 DIAGNOSIS — S42402A Unspecified fracture of lower end of left humerus, initial encounter for closed fracture: Secondary | ICD-10-CM | POA: Insufficient documentation

## 2020-09-01 DIAGNOSIS — S42402S Unspecified fracture of lower end of left humerus, sequela: Secondary | ICD-10-CM | POA: Diagnosis not present

## 2020-09-01 DIAGNOSIS — S52125A Nondisplaced fracture of head of left radius, initial encounter for closed fracture: Secondary | ICD-10-CM | POA: Diagnosis not present

## 2020-09-10 ENCOUNTER — Other Ambulatory Visit: Payer: Self-pay | Admitting: Internal Medicine

## 2020-09-15 DIAGNOSIS — S52125D Nondisplaced fracture of head of left radius, subsequent encounter for closed fracture with routine healing: Secondary | ICD-10-CM | POA: Diagnosis not present

## 2020-10-12 ENCOUNTER — Other Ambulatory Visit: Payer: Self-pay

## 2020-10-13 ENCOUNTER — Other Ambulatory Visit: Payer: Self-pay

## 2020-10-13 ENCOUNTER — Encounter: Payer: Self-pay | Admitting: Internal Medicine

## 2020-10-13 ENCOUNTER — Ambulatory Visit (INDEPENDENT_AMBULATORY_CARE_PROVIDER_SITE_OTHER): Payer: Medicare Other | Admitting: Internal Medicine

## 2020-10-13 VITALS — BP 168/82 | HR 69 | Temp 98.0°F | Ht <= 58 in | Wt 127.6 lb

## 2020-10-13 DIAGNOSIS — I10 Essential (primary) hypertension: Secondary | ICD-10-CM

## 2020-10-13 DIAGNOSIS — Z0000 Encounter for general adult medical examination without abnormal findings: Secondary | ICD-10-CM | POA: Diagnosis not present

## 2020-10-13 DIAGNOSIS — M546 Pain in thoracic spine: Secondary | ICD-10-CM | POA: Diagnosis not present

## 2020-10-13 DIAGNOSIS — I739 Peripheral vascular disease, unspecified: Secondary | ICD-10-CM

## 2020-10-13 DIAGNOSIS — R739 Hyperglycemia, unspecified: Secondary | ICD-10-CM | POA: Diagnosis not present

## 2020-10-13 DIAGNOSIS — F411 Generalized anxiety disorder: Secondary | ICD-10-CM

## 2020-10-13 NOTE — Assessment & Plan Note (Signed)
Cardiology ref - may need to use Repatha

## 2020-10-13 NOTE — Assessment & Plan Note (Signed)
BP ok at home 

## 2020-10-13 NOTE — Patient Instructions (Signed)
Tai Chi, yoga for balance

## 2020-10-13 NOTE — Assessment & Plan Note (Signed)
A1c

## 2020-10-13 NOTE — Assessment & Plan Note (Signed)
  We discussed age appropriate health related issues, including available/recomended screening tests and vaccinations. Labs were ordered to be later reviewed . All questions were answered. We discussed one or more of the following - seat belt use, use of sunscreen/sun exposure exercise, safe sex, fall risk reduction, second hand smoke exposure, firearm use and storage, seat belt use, a need for adhering to healthy diet and exercise. °Labs were ordered.  All questions were answered. °Chair yoga, balance exercises °Colonoscopy is due in 2024 °Mammogram every year °

## 2020-10-13 NOTE — Assessment & Plan Note (Signed)
Lorazepam prn  Potential benefits of a long term benzodiazepines  use as well as potential risks  and complications were explained to the patient and were aknowledged.  

## 2020-10-13 NOTE — Progress Notes (Signed)
Subjective:  Patient ID: Sharon Paul, female    DOB: Aug 21, 1946  Age: 75 y.o. MRN: [id number]  CC: Annual Exam   HPI Sharon Paul presents for a well exam  F/u dyslipidemia, depression, CAD f/u C/o soreness from Crestor (taking it QOD)  Outpatient Medications Prior to Visit  Medication Sig Dispense Refill  . acetaminophen (TYLENOL) 500 MG tablet Take 500 mg by mouth every 6 (six) hours as needed.    [name] albuterol (VENTOLIN HFA) 108 (90 Base) MCG/ACT inhaler TAKE 2 PUFFS BY MOUTH EVERY 6 HOURS AS NEEDED FOR WHEEZE OR SHORTNESS OF BREATH 18 g 3  . aspirin 81 MG tablet Take 81 mg by mouth daily.    [name] azelastine (ASTELIN) 0.1 % nasal spray Place 2 sprays into both nostrils 2 (two) times daily. 30 mL 0  . Cholecalciferol 2000 UNITS TABS Take 1 tablet by mouth daily.    . diazepam (VALIUM) 5 MG tablet Take 5 mg by mouth 2 (two) times daily as needed.    . diphenoxylate-atropine (LOMOTIL) 2.5-0.025 MG tablet Take 1-2 tablets by mouth 4 (four) times daily as needed for diarrhea or loose stools. 60 tablet 0  . FLUoxetine (PROZAC) 10 MG capsule Take 1 capsule (10 mg total) by mouth daily. 90 capsule 3  . fluticasone (FLONASE) 50 MCG/ACT nasal spray USE 2 SPRAYS NASALLY DAILY 48 g 3  . ibuprofen (ADVIL,MOTRIN) 800 MG tablet Take 1 tablet (800 mg total) by mouth every 8 (eight) hours as needed. 60 tablet 1  . irbesartan (AVAPRO) 300 MG tablet Take 0.5 tablets (150 mg total) by mouth 2 (two) times daily. 90 tablet 3  . labetalol (NORMODYNE) 300 MG tablet TAKE 1 TABLET BY MOUTH  TWICE DAILY 180 tablet 3  . LORazepam (ATIVAN) 0.5 MG tablet TAKE 1 TABLET BY MOUTH TWICE A DAY (Patient taking differently: 0.5 mg as needed.) 60 tablet 3  . meloxicam (MOBIC) 7.5 MG tablet TAKE 1 TABLET BY MOUTH  DAILY AS NEEDED FOR PAIN 180 tablet 0  . NONFORMULARY OR COMPOUNDED ITEM Estradiol 0.02% vaginal cream insert 1 gram vaginally twice weekly 24 each 3  . pantoprazole (PROTONIX) 40 MG tablet TAKE  1 TABLET BY MOUTH  DAILY 90 tablet 3  . rosuvastatin (CRESTOR) 10 MG tablet TAKE 1 TABLET BY MOUTH  EVERY OTHER DAY 45 tablet 2  . valACYclovir (VALTREX) 1000 MG tablet Take 2 tablets at the earliest onset of cold sore symptoms.  Repeat with 2 tablets 12 hours later 12 tablet 1   No facility-administered medications prior to visit.    ROS: Review of Systems  Constitutional: Positive for fatigue. Negative for activity change, appetite change, chills and unexpected weight change.  HENT: Negative for congestion, mouth sores and sinus pressure.   Eyes: Negative for visual disturbance.  Respiratory: Negative for cough and chest tightness.   Gastrointestinal: Negative for abdominal pain and nausea.  Genitourinary: Negative for difficulty urinating, frequency and vaginal pain.  Musculoskeletal: Positive for arthralgias and gait problem. Negative for back pain.  Skin: Negative for pallor and rash.  Neurological: Negative for dizziness, tremors, weakness, numbness and headaches.  Psychiatric/Behavioral: Negative for confusion and sleep disturbance.    Objective:  BP (!) 168/82 (BP Location: Left Arm)   Pulse 69   Temp 98 F (36.7 C) (Oral)   Ht 4\' 9"  (1.448 m)   Wt 127 lb 9.6 oz (57.9 kg)   SpO2 98%   BMI 27.61 kg/m   BP Readings from Last 3 Encounters:  10/13/20 (!) 168/82  08/24/20 (!) 173/65  06/11/20 120/78    Wt Readings from Last 3 Encounters:  10/13/20 127 lb 9.6 oz (57.9 kg)  06/11/20 127 lb 12.8 oz (58 kg)  04/13/20 127 lb (57.6 kg)    Physical Exam Constitutional:      General: She is not in acute distress.    Appearance: She is well-developed.  HENT:     Head: Normocephalic.     Right Ear: External ear normal.     Left Ear: External ear normal.     Nose: Nose normal.     Mouth/Throat:     Mouth: Oropharynx is clear and moist.  Eyes:     General:        Right eye: No discharge.        Left eye: No discharge.     Conjunctiva/sclera: Conjunctivae normal.      Pupils: Pupils are equal, round, and reactive to light.  Neck:     Thyroid: No thyromegaly.     Vascular: No JVD.     Trachea: No tracheal deviation.  Cardiovascular:     Rate and Rhythm: Normal rate and regular rhythm.     Heart sounds: Normal heart sounds.  Pulmonary:     Effort: No respiratory distress.     Breath sounds: No stridor. No wheezing.  Abdominal:     General: Bowel sounds are normal. There is no distension.     Palpations: Abdomen is soft. There is no mass.     Tenderness: There is no abdominal tenderness. There is no guarding or rebound.  Musculoskeletal:        General: No tenderness or edema.     Cervical back: Normal range of motion and neck supple.  Lymphadenopathy:     Cervical: No cervical adenopathy.  Skin:    Findings: No erythema or rash.  Neurological:     Mental Status: She is oriented to person, place, and time.     Cranial Nerves: No cranial nerve deficit.     Motor: No abnormal muscle tone.     Coordination: Coordination normal.     Deep Tendon Reflexes: Reflexes normal.  Psychiatric:        Mood and Affect: Mood and affect normal.        Behavior: Behavior normal.        Thought Content: Thought content normal.        Judgment: Judgment normal.    A little ataxic  Lab Results  Component Value Date   WBC 3.3 (L) 10/10/2019   HGB 12.5 10/10/2019   HCT 37.8 10/10/2019   PLT 251.0 10/10/2019   GLUCOSE 97 04/16/2020   CHOL 203 (H) 04/16/2020   TRIG 117 04/16/2020   HDL 70 04/16/2020   LDLDIRECT 138.2 10/22/2010   LDLCALC 111 (H) 04/16/2020   ALT 17 04/16/2020   AST 19 04/16/2020   NA 139 04/16/2020   K 4.3 04/16/2020   CL 104 04/16/2020   CREATININE 0.82 04/16/2020   BUN 12 04/16/2020   CO2 26 04/16/2020   TSH 1.79 04/16/2020   HGBA1C 6.0 10/10/2019    DG Elbow Complete Left  Result Date: 08/24/2020 CLINICAL DATA:  Left elbow pain after fall EXAM: LEFT ELBOW - COMPLETE 3+ VIEW COMPARISON:  None. FINDINGS: Small elbow effusion.  Slight step-off deformity at the radial head neck junction. No dislocation. IMPRESSION: Slight step-off deformity/possible nondisplaced fracture at the radial head neck junction with small elbow effusion. Electronically Signed  By: Donavan Foil M.D.   On: 08/24/2020 17:27    Assessment & Plan:    Walker Kehr, MD

## 2020-10-23 ENCOUNTER — Other Ambulatory Visit (INDEPENDENT_AMBULATORY_CARE_PROVIDER_SITE_OTHER): Payer: Medicare Other

## 2020-10-23 ENCOUNTER — Other Ambulatory Visit: Payer: Self-pay

## 2020-10-23 DIAGNOSIS — R739 Hyperglycemia, unspecified: Secondary | ICD-10-CM | POA: Diagnosis not present

## 2020-10-23 DIAGNOSIS — Z0000 Encounter for general adult medical examination without abnormal findings: Secondary | ICD-10-CM | POA: Diagnosis not present

## 2020-10-23 LAB — CBC WITH DIFFERENTIAL/PLATELET
Basophils Absolute: 0 10*3/uL (ref 0.0–0.1)
Basophils Relative: 0.9 % (ref 0.0–3.0)
Eosinophils Absolute: 0.1 10*3/uL (ref 0.0–0.7)
Eosinophils Relative: 1.9 % (ref 0.0–5.0)
HCT: 38.1 % (ref 36.0–46.0)
Hemoglobin: 12.6 g/dL (ref 12.0–15.0)
Lymphocytes Relative: 36.8 % (ref 12.0–46.0)
Lymphs Abs: 1.4 10*3/uL (ref 0.7–4.0)
MCHC: 33 g/dL (ref 30.0–36.0)
MCV: 91.6 fl (ref 78.0–100.0)
Monocytes Absolute: 0.4 10*3/uL (ref 0.1–1.0)
Monocytes Relative: 9 % (ref 3.0–12.0)
Neutro Abs: 2 10*3/uL (ref 1.4–7.7)
Neutrophils Relative %: 51.4 % (ref 43.0–77.0)
Platelets: 254 10*3/uL (ref 150.0–400.0)
RBC: 4.16 Mil/uL (ref 3.87–5.11)
RDW: 13.2 % (ref 11.5–15.5)
WBC: 3.9 10*3/uL — ABNORMAL LOW (ref 4.0–10.5)

## 2020-10-23 LAB — HEMOGLOBIN A1C: Hgb A1c MFr Bld: 5.9 % (ref 4.6–6.5)

## 2020-10-23 LAB — URINALYSIS
Bilirubin Urine: NEGATIVE
Hgb urine dipstick: NEGATIVE
Ketones, ur: NEGATIVE
Leukocytes,Ua: NEGATIVE
Nitrite: NEGATIVE
Specific Gravity, Urine: 1.02 (ref 1.000–1.030)
Total Protein, Urine: NEGATIVE
Urine Glucose: NEGATIVE
Urobilinogen, UA: 0.2 (ref 0.0–1.0)
pH: 6 (ref 5.0–8.0)

## 2020-10-23 LAB — LIPID PANEL
Cholesterol: 218 mg/dL — ABNORMAL HIGH (ref 0–200)
HDL: 72.5 mg/dL (ref >39.00)
LDL Cholesterol: 121 mg/dL — ABNORMAL HIGH (ref 0–99)
NonHDL: 145
Total CHOL/HDL Ratio: 3
Triglycerides: 120 mg/dL (ref 0.0–149.0)
VLDL: 24 mg/dL (ref 0.0–40.0)

## 2020-10-23 LAB — COMPREHENSIVE METABOLIC PANEL
ALT: 17 U/L (ref 0–35)
AST: 16 U/L (ref 0–37)
Albumin: 4.5 g/dL (ref 3.5–5.2)
Alkaline Phosphatase: 91 U/L (ref 39–117)
BUN: 14 mg/dL (ref 6–23)
CO2: 31 mEq/L (ref 19–32)
Calcium: 10.1 mg/dL (ref 8.4–10.5)
Chloride: 103 mEq/L (ref 96–112)
Creatinine, Ser: 0.82 mg/dL (ref 0.40–1.20)
GFR: 70.67 mL/min (ref >60.00)
Glucose, Bld: 97 mg/dL (ref 70–99)
Potassium: 4.2 mEq/L (ref 3.5–5.1)
Sodium: 138 mEq/L (ref 135–145)
Total Bilirubin: 0.5 mg/dL (ref 0.2–1.2)
Total Protein: 6.7 g/dL (ref 6.0–8.3)

## 2020-10-23 LAB — TSH: TSH: 2.56 u[IU]/mL (ref 0.35–4.50)

## 2020-11-11 ENCOUNTER — Other Ambulatory Visit: Payer: Self-pay | Admitting: Internal Medicine

## 2020-11-21 IMAGING — DX DG HAND 2V*L*
2 series · 2 of 2 positions shown · non-contrast
Comparison: None.

CLINICAL DATA: Hand pain, deformed fingers

EXAM:
RIGHT HAND - 2 VIEW; LEFT HAND - 2 VIEW

[hand pa]
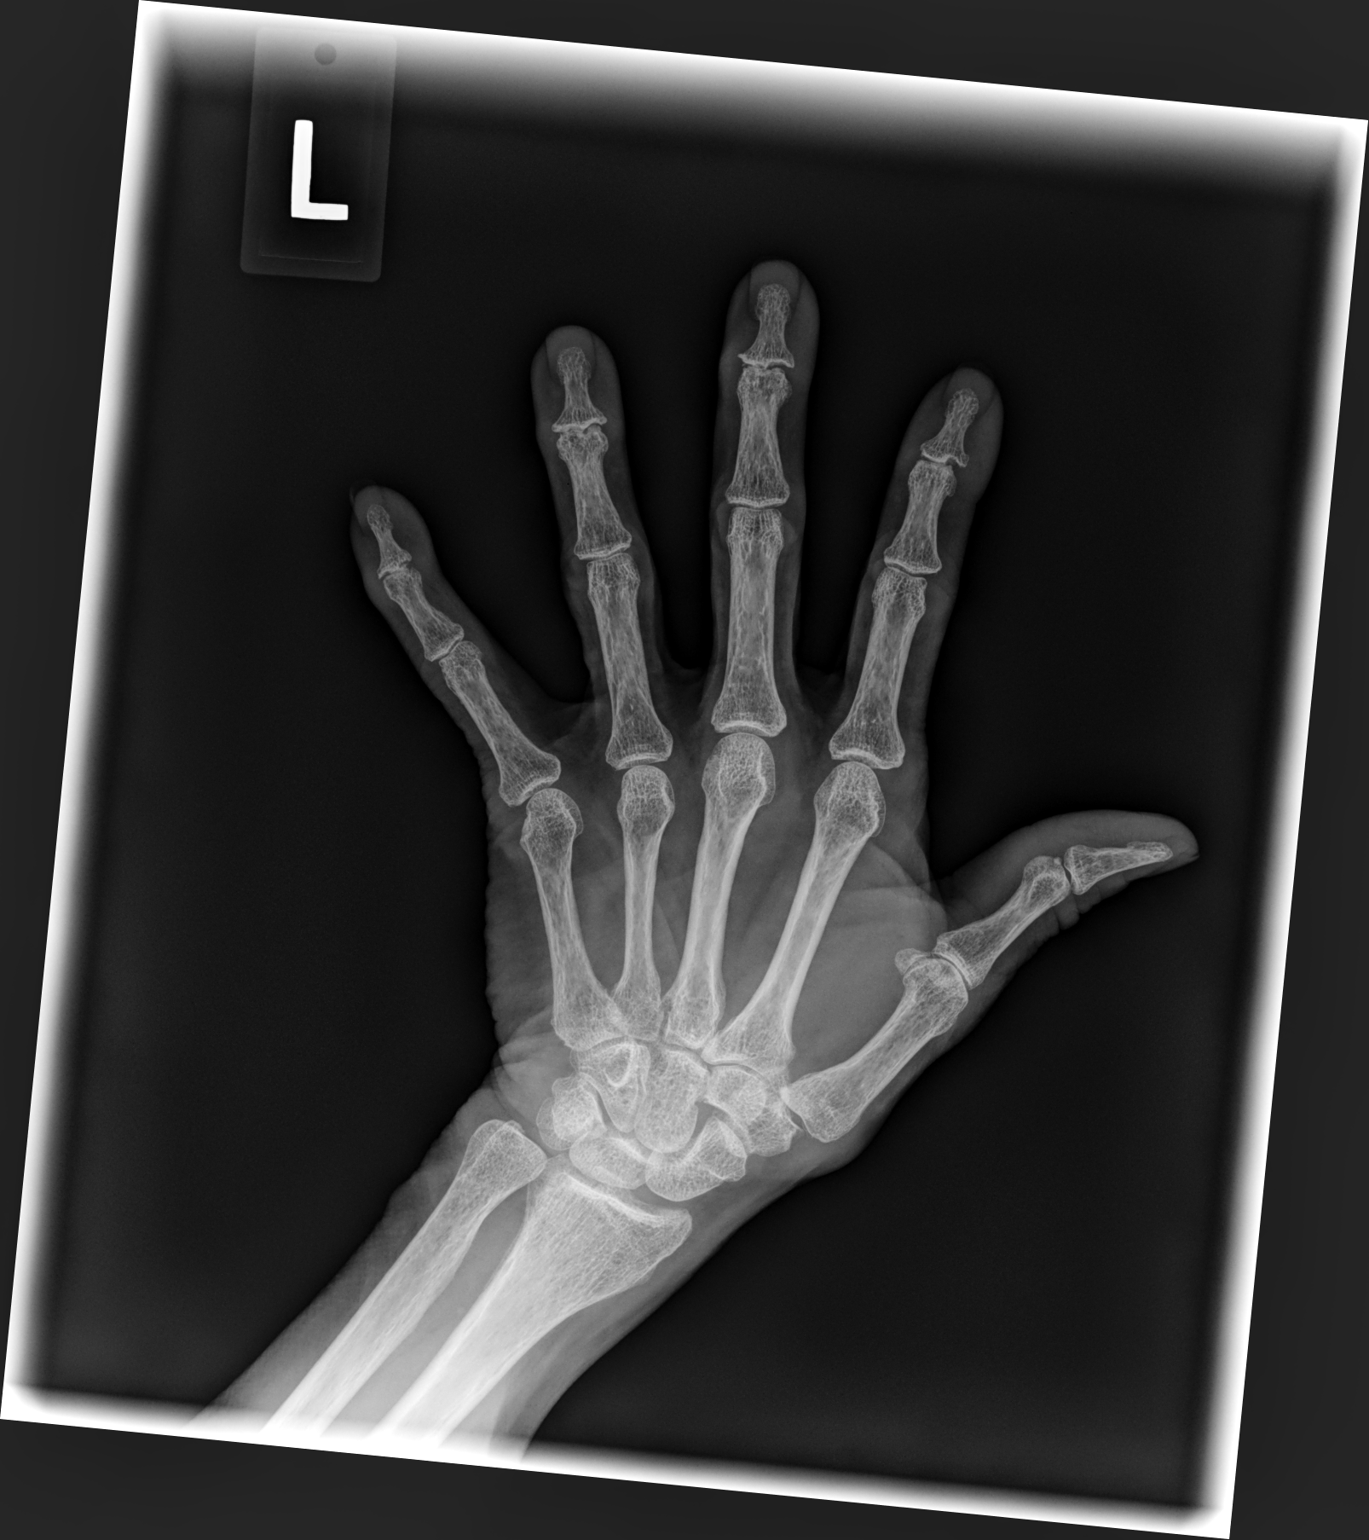

[hand mlo]
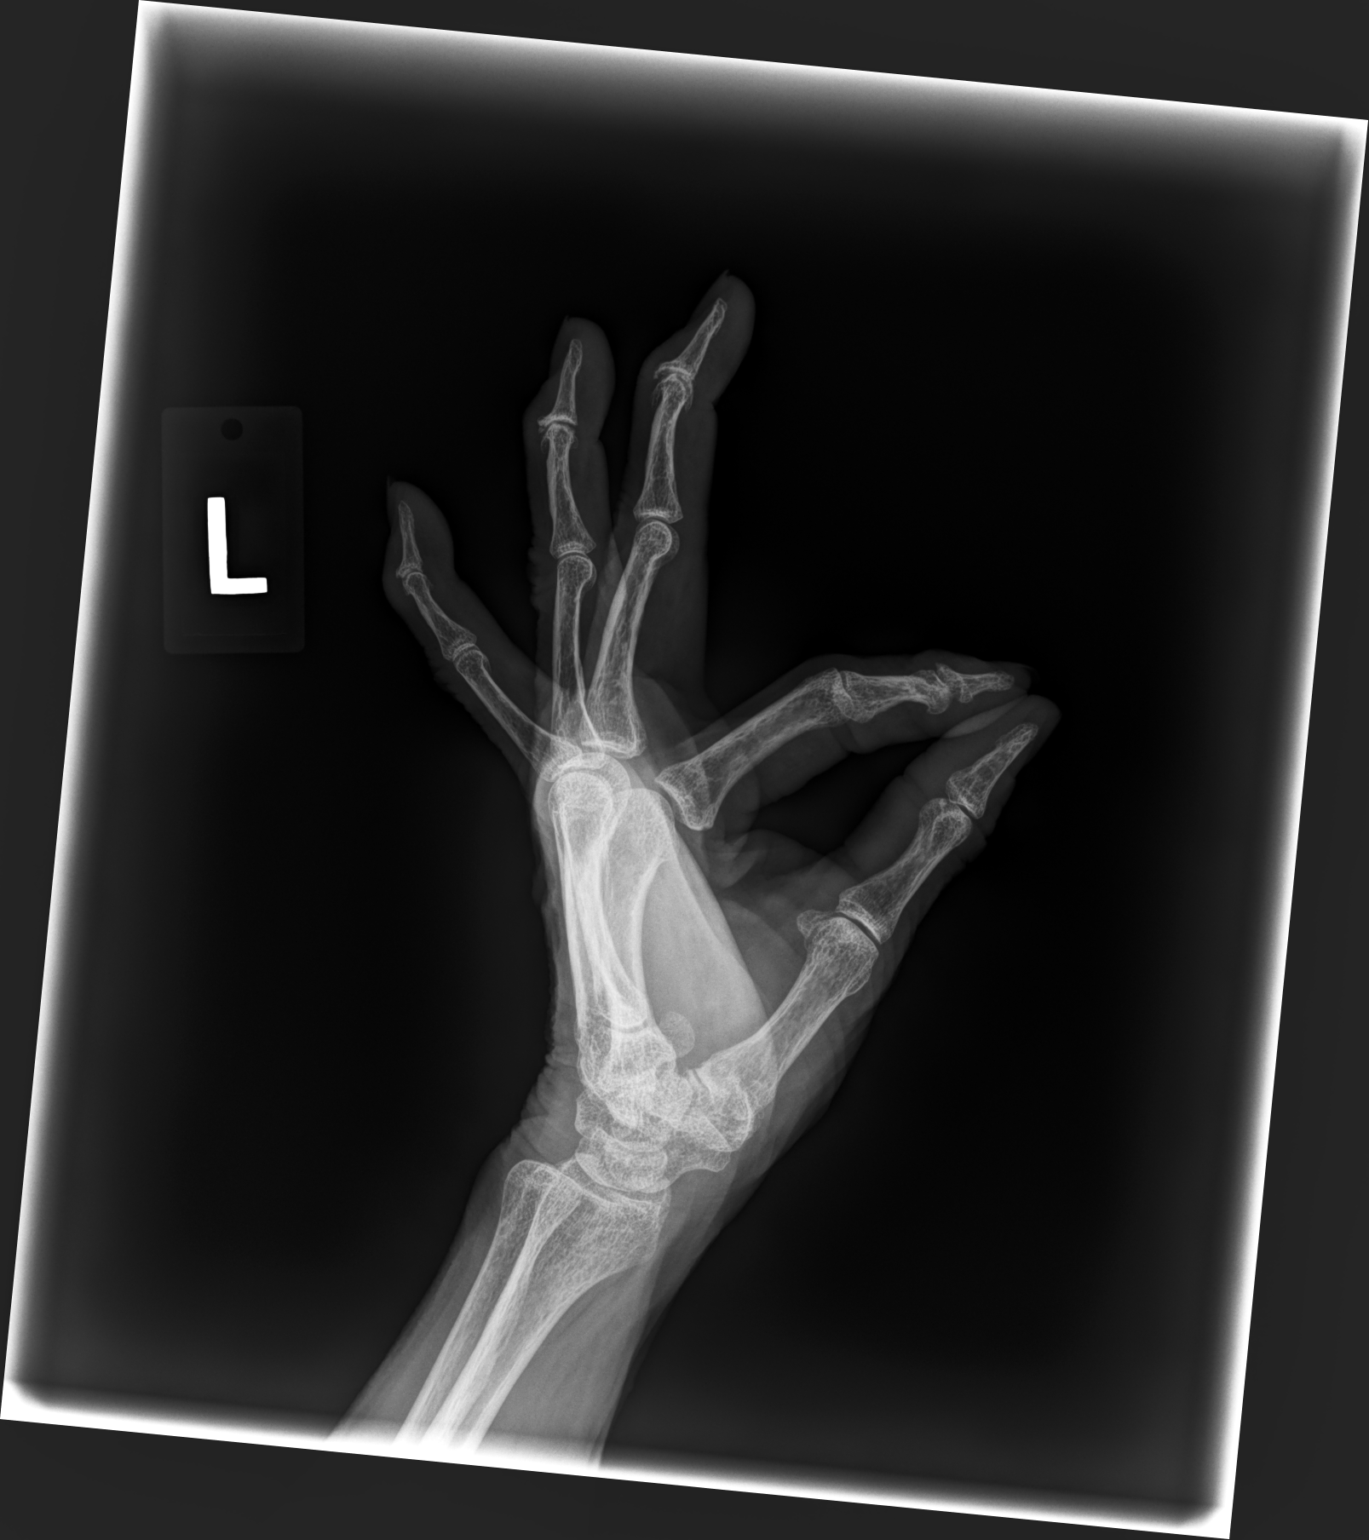

[2 of 2 positions shown; findings below may reference images not displayed]

FINDINGS: No fracture or dislocation of the bilateral hands. There is
generally moderate, symmetric osteoarthritic pattern degenerative
change, primarily involving the distal interphalangeal joints, with
mild Miki deformities of the digits. No evidence of bony
erosion or sclerosis. Soft tissues are unremarkable.
IMPRESSION: 1. No fracture or dislocation of the bilateral hands. There is
generally moderate, symmetric osteoarthritic pattern degenerative
change, primarily involving the distal interphalangeal joints, with
mild Miki deformities of the digits.

2. No evidence of bony erosion or sclerosis to suggest inflammatory
arthropathy. MRI is the most sensitive test to assess for
inflammatory synovitis or periarthritis if clinically suspected.

## 2020-11-21 IMAGING — DX DG HAND 2V*R*
2 series · 2 of 2 positions shown · non-contrast
Comparison: None.

CLINICAL DATA: Hand pain, deformed fingers

EXAM:
RIGHT HAND - 2 VIEW; LEFT HAND - 2 VIEW

[hand pa]
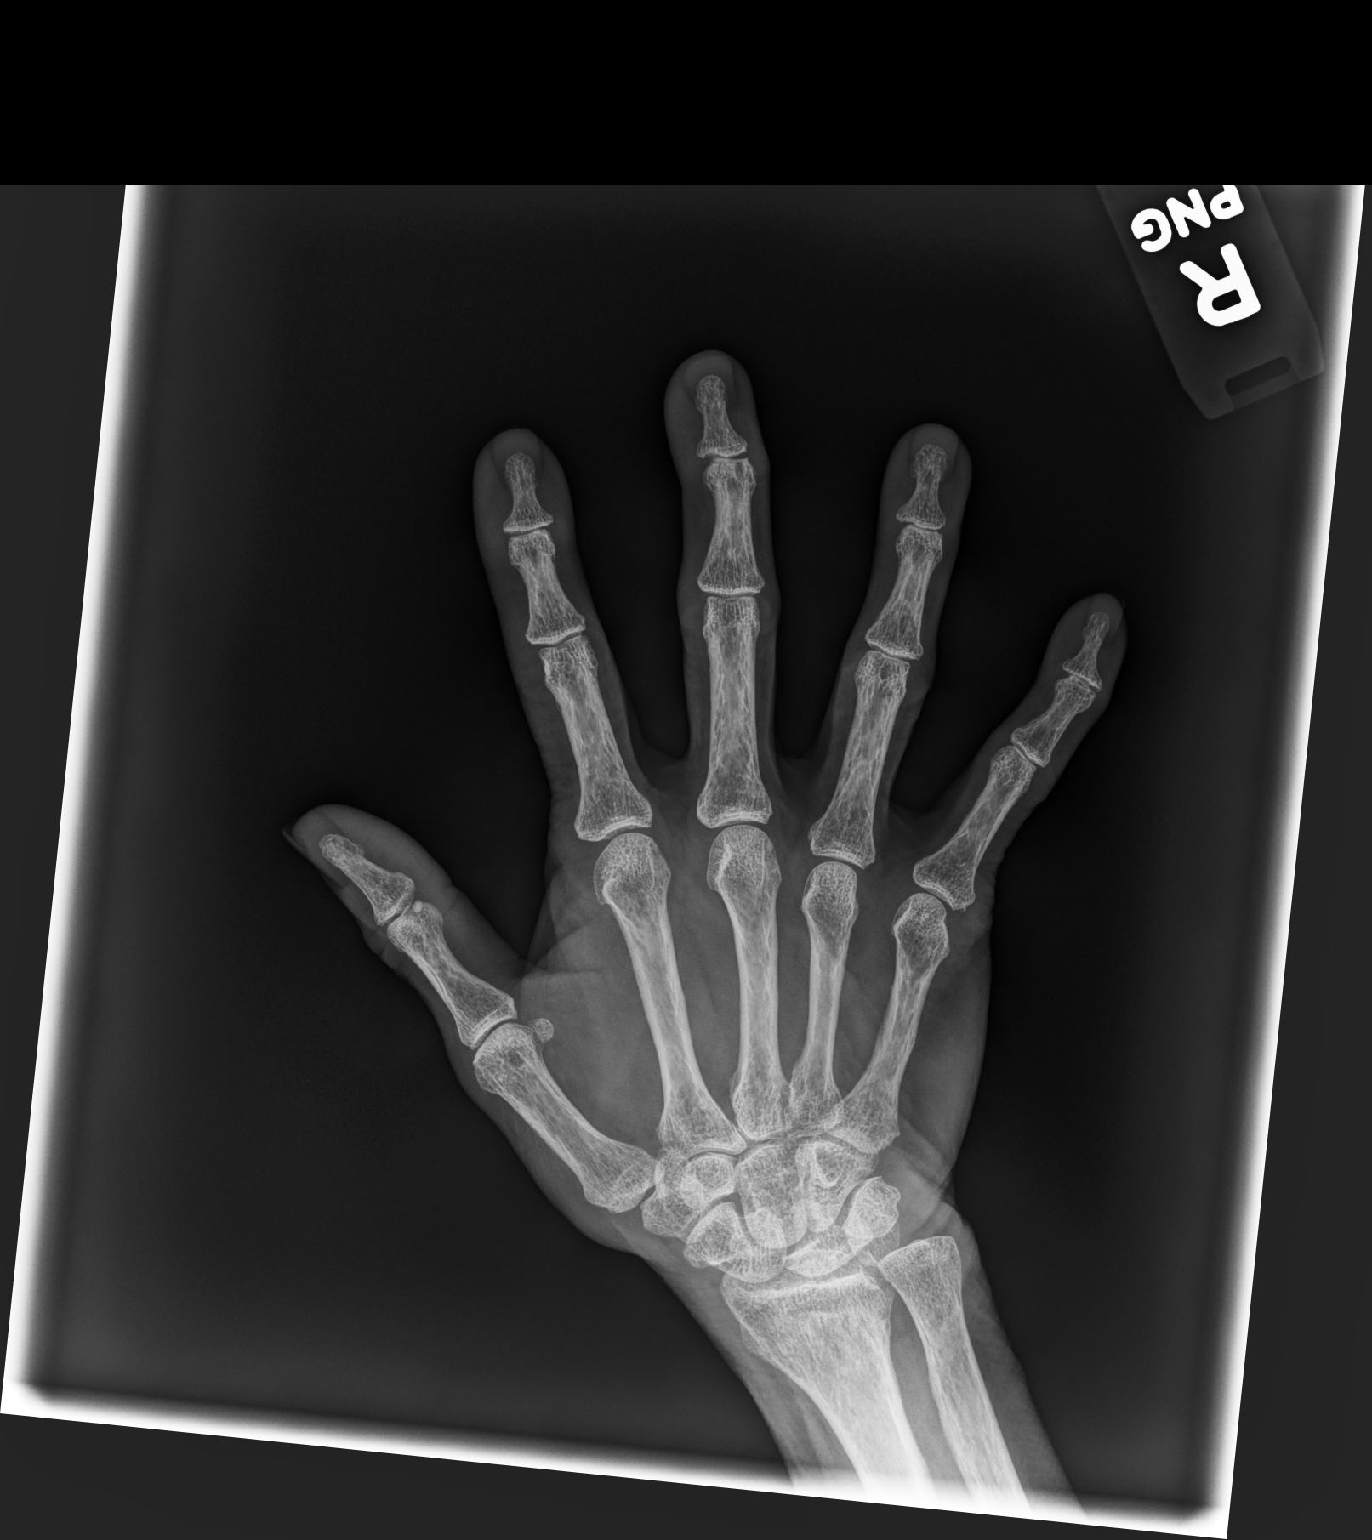

[hand mlo]
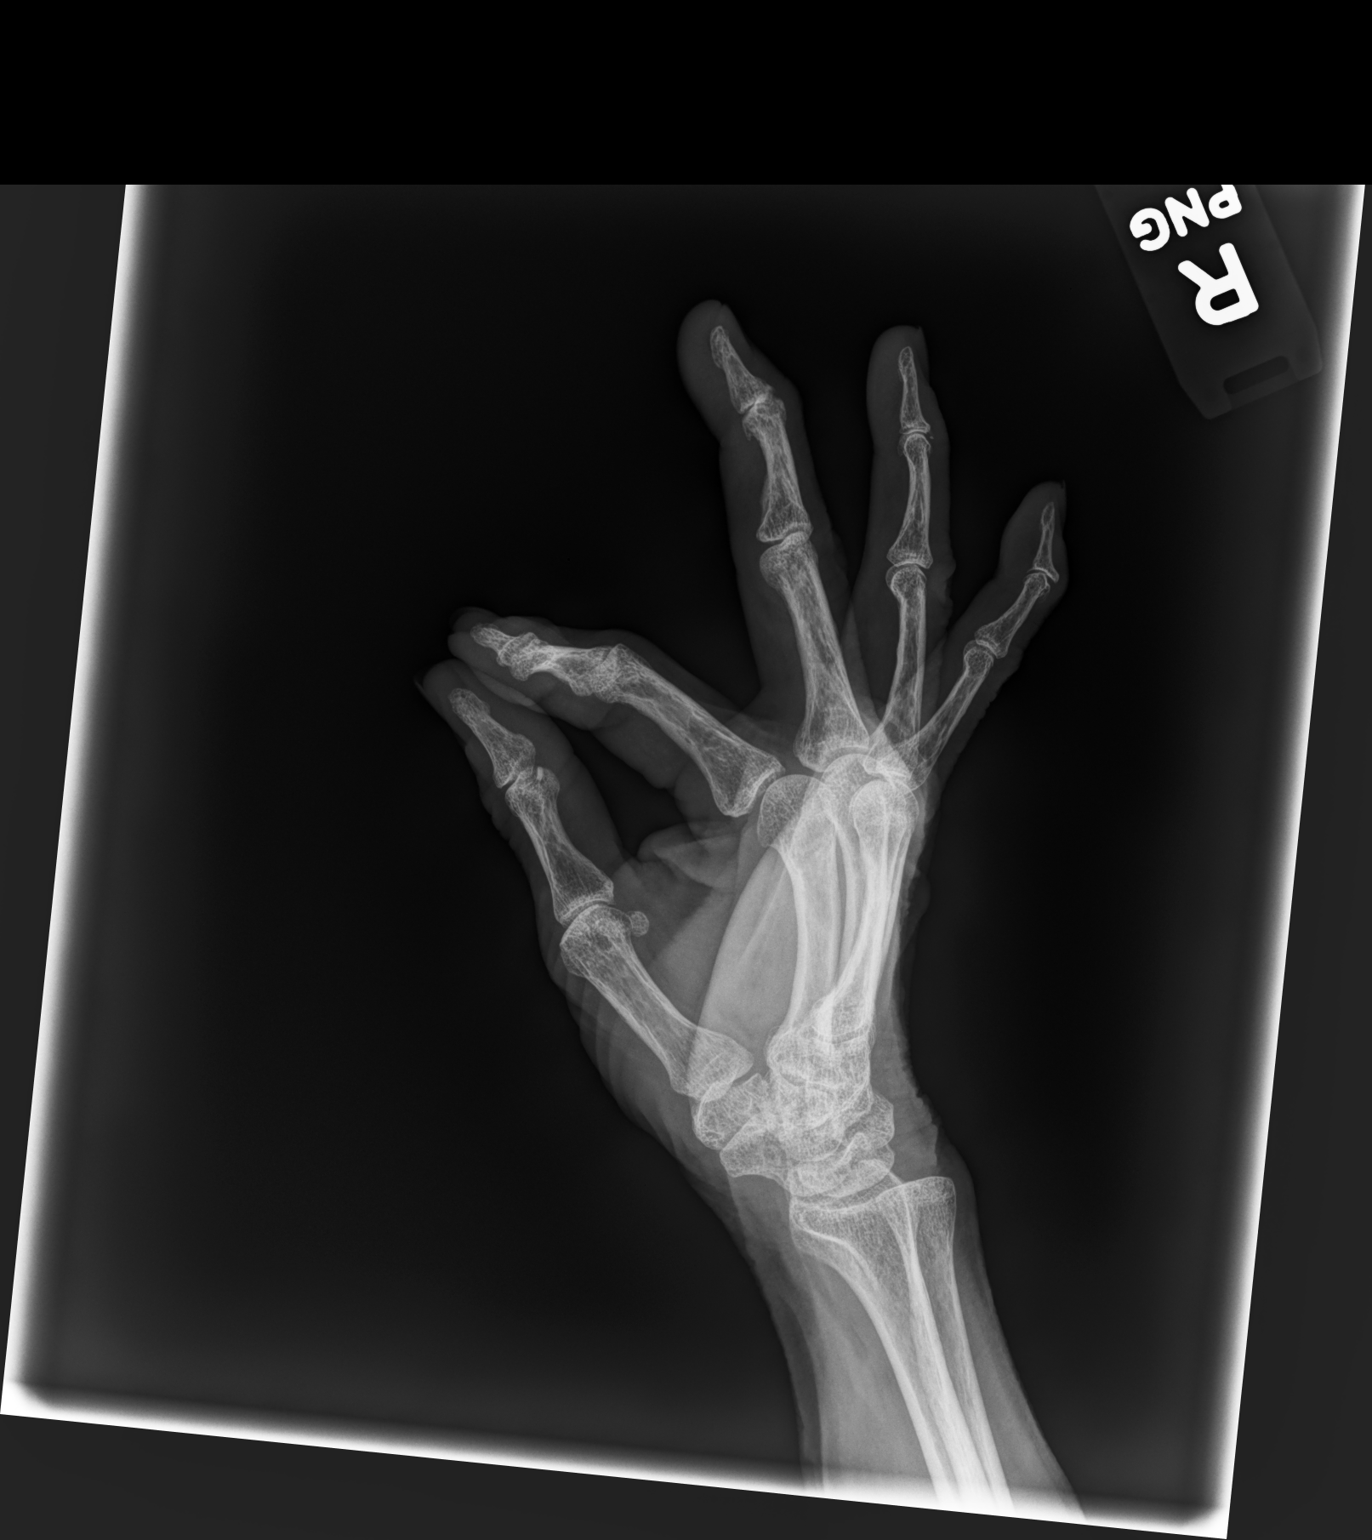

[2 of 2 positions shown; findings below may reference images not displayed]

FINDINGS: No fracture or dislocation of the bilateral hands. There is
generally moderate, symmetric osteoarthritic pattern degenerative
change, primarily involving the distal interphalangeal joints, with
mild Miki deformities of the digits. No evidence of bony
erosion or sclerosis. Soft tissues are unremarkable.
IMPRESSION: 1. No fracture or dislocation of the bilateral hands. There is
generally moderate, symmetric osteoarthritic pattern degenerative
change, primarily involving the distal interphalangeal joints, with
mild Miki deformities of the digits.

2. No evidence of bony erosion or sclerosis to suggest inflammatory
arthropathy. MRI is the most sensitive test to assess for
inflammatory synovitis or periarthritis if clinically suspected.

## 2020-12-22 ENCOUNTER — Other Ambulatory Visit: Payer: Self-pay

## 2020-12-22 ENCOUNTER — Ambulatory Visit (INDEPENDENT_AMBULATORY_CARE_PROVIDER_SITE_OTHER): Payer: Medicare Other | Admitting: Internal Medicine

## 2020-12-22 ENCOUNTER — Encounter: Payer: Self-pay | Admitting: Internal Medicine

## 2020-12-22 VITALS — BP 128/72 | HR 63 | Ht <= 58 in | Wt 134.0 lb

## 2020-12-22 DIAGNOSIS — R739 Hyperglycemia, unspecified: Secondary | ICD-10-CM

## 2020-12-22 DIAGNOSIS — M545 Low back pain, unspecified: Secondary | ICD-10-CM | POA: Diagnosis not present

## 2020-12-22 DIAGNOSIS — R300 Dysuria: Secondary | ICD-10-CM

## 2020-12-22 DIAGNOSIS — I10 Essential (primary) hypertension: Secondary | ICD-10-CM | POA: Diagnosis not present

## 2020-12-22 MED ORDER — CIPROFLOXACIN HCL 500 MG PO TABS: 500.0000 mg | ORAL_TABLET | Freq: Two times a day (BID) | ORAL | 0 refills | Status: AC

## 2020-12-22 MED ORDER — TIZANIDINE HCL 2 MG PO TABS: 2.0000 mg | ORAL_TABLET | Freq: Four times a day (QID) | ORAL | 1 refills | Status: DC | PRN

## 2020-12-22 NOTE — Patient Instructions (Signed)
Please take all new medication as prescribed - the antibiotic, and also the muscle relaxer as needed  Please continue all other medications as before, and refills have been done if requested.  Please have the pharmacy call with any other refills you may need.  Please keep your appointments with your specialists as you may have planned  Your specimen will be sent for culture

## 2020-12-22 NOTE — Progress Notes (Signed)
Patient ID: Sharon Paul, female   DOB: 05/07/1946, 75 y.o.   MRN: [id number]        Chief Complaint: dysuria and low back pain       HPI:  Sharon Paul is a 75 y.o. female here with c/o 3 days onset dysuria and frequency, but Denies urinary symptoms such as urgency, flank pain, hematuria or n/v, fever, chills.  Also has several days of LBP acute across the back, mild to mod, dull and sharp, worse to stand and bend, better to sit or lie down and Pt continues to have recurring LBP but no worsening LE pain/numbness/weakness, gait change or falls.  Pt denies fever, wt loss, night sweats, loss of appetite, or other constitutional symptoms        Wt Readings from Last 3 Encounters:  12/28/20 133 lb 9.6 oz (60.6 kg)  12/22/20 134 lb (60.8 kg)  10/13/20 127 lb 9.6 oz (57.9 kg)   BP Readings from Last 3 Encounters:  12/28/20 116/70  12/22/20 128/72  10/13/20 (!) 168/82         Past Medical History:  Diagnosis Date  . Celiac sprue 2007  . CVD (cardiovascular disease)   . Foot fracture, left 2011  . GERD (gastroesophageal reflux disease)   . Hyperlipidemia   . Osteopenia 04/2019   T score -1.9 FRAX 10% / 1.9%.  Stable from prior DEXA  . PVD (peripheral vascular disease) (HCC)    mild carotid ? fibromuscular dysplasia  . Shingles   . Urinary incontinence    Past Surgical History:  Procedure Laterality Date  . APPENDECTOMY    . BREAST SURGERY     right breast biopsy  . ESOPHAGOGASTRODUODENOSCOPY    . ROTATOR CUFF REPAIR     RIGHT  . ROTATOR CUFF REPAIR     Left  . stress cardiolite  04-18-00  . VAGINAL HYSTERECTOMY  1993   leiomyomata    reports that she quit smoking about 50 years ago. She has never used smokeless tobacco. She reports current alcohol use. She reports that she does not use drugs. family history includes Glaucoma in her brother and mother; Heart disease in her father and mother; Hypertension in her brother, father, mother, paternal grandmother, and  sister; Lung cancer in her paternal grandmother; Stroke in her father. Allergies  Allergen Reactions  . Amlodipine     HA  . Augmentin [Amoxicillin-Pot Clavulanate]     diarrhea  . Clarithromycin     REACTION: gi upset. Can take Zpac  . Codeine Nausea And Vomiting    Can take Prom-cod syr  . Esomeprazole Magnesium     REACTION: side pain  . Hydrochlorothiazide     REACTION: leg cramps, dehydration  . Iohexol      Desc: PT HAS SWELLING TO FACE,LIPS AND THROAT WITH CONTRAST DYE   . Kapidex [Dexlansoprazole]     rash  . Losartan Potassium     REACTION: HA  . Olmesartan Medoxomil     REACTION: weak legs  . Ramipril     Per pt: unknown reaction  . Risedronate Sodium     Per pt: unknown reaction   Current Outpatient Medications on File Prior to Visit  Medication Sig Dispense Refill  . acetaminophen (TYLENOL) 500 MG tablet Take 500 mg by mouth every 6 (six) hours as needed.    [name] aspirin 81 MG tablet Take 81 mg by mouth daily.    [name] azelastine (ASTELIN) 0.1 % nasal spray Place 2 sprays into  both nostrils 2 (two) times daily. 30 mL 0  . Cholecalciferol 2000 UNITS TABS Take 1 tablet by mouth daily.    . diazepam (VALIUM) 5 MG tablet Take 5 mg by mouth 2 (two) times daily as needed.    . diphenoxylate-atropine (LOMOTIL) 2.5-0.025 MG tablet Take 1-2 tablets by mouth 4 (four) times daily as needed for diarrhea or loose stools. 60 tablet 0  . FLUoxetine (PROZAC) 10 MG capsule TAKE 1 CAPSULE BY MOUTH  DAILY 90 capsule 3  . fluticasone (FLONASE) 50 MCG/ACT nasal spray USE 2 SPRAYS NASALLY DAILY 48 g 3  . irbesartan (AVAPRO) 300 MG tablet Take 0.5 tablets (150 mg total) by mouth 2 (two) times daily. 90 tablet 3  . labetalol (NORMODYNE) 300 MG tablet TAKE 1 TABLET BY MOUTH  TWICE DAILY 180 tablet 3  . LORazepam (ATIVAN) 0.5 MG tablet TAKE 1 TABLET BY MOUTH TWICE A DAY (Patient taking differently: 0.5 mg as needed.) 60 tablet 3  . pantoprazole (PROTONIX) 40 MG tablet TAKE 1 TABLET BY MOUTH   DAILY 90 tablet 3  . valACYclovir (VALTREX) 1000 MG tablet Take 2 tablets at the earliest onset of cold sore symptoms.  Repeat with 2 tablets 12 hours later 12 tablet 1   No current facility-administered medications on file prior to visit.        ROS:  All others reviewed and negative.  Objective        PE:  BP 128/72 (BP Location: Left Arm, Patient Position: Sitting, Cuff Size: Normal)   Pulse 63   Ht 4\' 9"  (1.448 m)   Wt 134 lb (60.8 kg)   SpO2 97%   BMI 29.00 kg/m                 Constitutional: Pt appears in NAD               HENT: Head: NCAT.                Right Ear: External ear normal.                 Left Ear: External ear normal.                Eyes: . Pupils are equal, round, and reactive to light. Conjunctivae and EOM are normal               Nose: without d/c or deformity               Neck: Neck supple. Gross normal ROM               Cardiovascular: Normal rate and regular rhythm.                 Pulmonary/Chest: Effort normal and breath sounds without rales or wheezing.                Abd:  Soft, NT, ND, + BS, no organomegaly               Neurological: Pt is alert. At baseline orientation, motor grossly intact               Skin: Skin is warm. No rashes, no other new lesions, LE edema - none, spine nontender in the midline, and has tender bilateral lumbar paravertebral spasm               Psychiatric: Pt behavior is normal without agitation   Micro: none  Cardiac tracings I  have personally interpreted today:  none  Pertinent Radiological findings (summarize): none   Lab Results  Component Value Date   WBC 3.9 (L) 10/23/2020   HGB 12.6 10/23/2020   HCT 38.1 10/23/2020   PLT 254.0 10/23/2020   GLUCOSE 97 10/23/2020   CHOL 218 (H) 10/23/2020   TRIG 120.0 10/23/2020   HDL 72.50 10/23/2020   LDLDIRECT 138.2 10/22/2010   LDLCALC 121 (H) 10/23/2020   ALT 17 10/23/2020   AST 16 10/23/2020   NA 138 10/23/2020   K 4.2 10/23/2020   CL 103 10/23/2020    CREATININE 0.82 10/23/2020   BUN 14 10/23/2020   CO2 31 10/23/2020   TSH 2.56 10/23/2020   HGBA1C 5.9 10/23/2020   Assessment/Plan:  Sharon Paul is a 76 y.o. White or Caucasian [1] female with  has a past medical history of Celiac sprue (2007), CVD (cardiovascular disease), Foot fracture, left (2011), GERD (gastroesophageal reflux disease), Hyperlipidemia, Osteopenia (04/2019), PVD (peripheral vascular disease) ([facility]), Shingles, and Urinary incontinence.  Dysuria Cant r/o uti - for urine studies, and tx pending results  Low back pain C/w msk strain, for tizandine prn, to f/u any worsening symptoms or concerns  Hyperglycemia Lab Results  Component Value Date   HGBA1C 5.9 10/23/2020   Stable, pt to continue current medical treatment  - diet   Essential hypertension . BP Readings from Last 3 Encounters:  12/28/20 116/70  12/22/20 128/72  10/13/20 (!) 168/82   Stable, pt to continue medical treatment avapro, normodyne   Followup: Return if symptoms worsen or fail to improve.  Cathlean Cower, MD 12/28/2020 8:16 PM [facility]

## 2020-12-23 ENCOUNTER — Encounter: Payer: Self-pay | Admitting: Internal Medicine

## 2020-12-23 LAB — URINALYSIS, ROUTINE W REFLEX MICROSCOPIC
Bilirubin Urine: NEGATIVE
Ketones, ur: NEGATIVE
Nitrite: NEGATIVE
Specific Gravity, Urine: <=1.005 — AB (ref 1.000–1.030)
Total Protein, Urine: NEGATIVE
Urine Glucose: NEGATIVE
Urobilinogen, UA: 0.2 (ref 0.0–1.0)
pH: 6 (ref 5.0–8.0)

## 2020-12-24 ENCOUNTER — Encounter: Payer: Self-pay | Admitting: Internal Medicine

## 2020-12-24 LAB — URINE CULTURE

## 2020-12-28 ENCOUNTER — Encounter: Payer: Self-pay | Admitting: Internal Medicine

## 2020-12-28 ENCOUNTER — Ambulatory Visit: Payer: Medicare Other | Admitting: Internal Medicine

## 2020-12-28 ENCOUNTER — Other Ambulatory Visit: Payer: Self-pay

## 2020-12-28 VITALS — BP 116/70 | HR 61 | Ht 59.0 in | Wt 133.6 lb

## 2020-12-28 DIAGNOSIS — E785 Hyperlipidemia, unspecified: Secondary | ICD-10-CM

## 2020-12-28 DIAGNOSIS — I10 Essential (primary) hypertension: Secondary | ICD-10-CM

## 2020-12-28 DIAGNOSIS — E559 Vitamin D deficiency, unspecified: Secondary | ICD-10-CM

## 2020-12-28 DIAGNOSIS — Z79899 Other long term (current) drug therapy: Secondary | ICD-10-CM | POA: Diagnosis not present

## 2020-12-28 DIAGNOSIS — I739 Peripheral vascular disease, unspecified: Secondary | ICD-10-CM

## 2020-12-28 MED ORDER — ROSUVASTATIN CALCIUM 5 MG PO TABS: ORAL_TABLET | ORAL | 3 refills | Status: DC

## 2020-12-28 NOTE — Assessment & Plan Note (Signed)
Cant r/o uti - for urine studies, and tx pending results

## 2020-12-28 NOTE — Progress Notes (Signed)
Cardiology Office Note   Date:  12/28/2020   ID:  Sharon Paul, DOB 1946-01-15, MRN [id number]  PCP:  Cassandria Anger, MD  Cardiologist:   Dorris Carnes, MD    Pt presents for eval/follow up of CV dz and HL    History of Present Illness: Sharon Paul is a 75 y.o. female with a history of CV dz, GERD, HL, sprue   Last carotid USN in 2018   CT of chest in 2007 showed mild coronary calcifications I actually saw the pt in the early 2006    The pt has signif problemes tolerating statins due to achiness  She denies CP   No SOB   Activity is limited by DJD an bursitis No neuro complaints    BP is usually good    Current Meds  Medication Sig  . acetaminophen (TYLENOL) 500 MG tablet Take 500 mg by mouth every 6 (six) hours as needed.  [name] aspirin 81 MG tablet Take 81 mg by mouth daily.  [name] azelastine (ASTELIN) 0.1 % nasal spray Place 2 sprays into both nostrils 2 (two) times daily.  . Cholecalciferol 2000 UNITS TABS Take 1 tablet by mouth daily.  . ciprofloxacin (CIPRO) 500 MG tablet Take 1 tablet (500 mg total) by mouth 2 (two) times daily for 10 days.  . diazepam (VALIUM) 5 MG tablet Take 5 mg by mouth 2 (two) times daily as needed.  . diphenoxylate-atropine (LOMOTIL) 2.5-0.025 MG tablet Take 1-2 tablets by mouth 4 (four) times daily as needed for diarrhea or loose stools.  [name] FLUoxetine (PROZAC) 10 MG capsule TAKE 1 CAPSULE BY MOUTH  DAILY  . fluticasone (FLONASE) 50 MCG/ACT nasal spray USE 2 SPRAYS NASALLY DAILY  . irbesartan (AVAPRO) 300 MG tablet Take 0.5 tablets (150 mg total) by mouth 2 (two) times daily.  [name] labetalol (NORMODYNE) 300 MG tablet TAKE 1 TABLET BY MOUTH  TWICE DAILY  . LORazepam (ATIVAN) 0.5 MG tablet TAKE 1 TABLET BY MOUTH TWICE A DAY (Patient taking differently: 0.5 mg as needed.)  . pantoprazole (PROTONIX) 40 MG tablet TAKE 1 TABLET BY MOUTH  DAILY  . rosuvastatin (CRESTOR) 10 MG tablet TAKE 1 TABLET BY MOUTH  EVERY OTHER DAY  . valACYclovir  (VALTREX) 1000 MG tablet Take 2 tablets at the earliest onset of cold sore symptoms.  Repeat with 2 tablets 12 hours later     Allergies:   Amlodipine, Augmentin [amoxicillin-pot clavulanate], Clarithromycin, Codeine, Esomeprazole magnesium, Hydrochlorothiazide, Iohexol, Kapidex [dexlansoprazole], Losartan potassium, Olmesartan medoxomil, Ramipril, and Risedronate sodium   Past Medical History:  Diagnosis Date  . Celiac sprue 2007  . CVD (cardiovascular disease)   . Foot fracture, left 2011  . GERD (gastroesophageal reflux disease)   . Hyperlipidemia   . Osteopenia 04/2019   T score -1.9 FRAX 10% / 1.9%.  Stable from prior DEXA  . PVD (peripheral vascular disease) (HCC)    mild carotid ? fibromuscular dysplasia  . Shingles   . Urinary incontinence     Past Surgical History:  Procedure Laterality Date  . APPENDECTOMY    . BREAST SURGERY     right breast biopsy  . ESOPHAGOGASTRODUODENOSCOPY    . ROTATOR CUFF REPAIR     RIGHT  . ROTATOR CUFF REPAIR     Left  . stress cardiolite  04-18-00  . VAGINAL HYSTERECTOMY  1993   leiomyomata     Social History:  The patient  reports that she quit smoking about 50 years ago. She has never  used smokeless tobacco. She reports current alcohol use. She reports that she does not use drugs.   Family History:  The patient's family history includes Glaucoma in her brother and mother; Heart disease in her father and mother; Hypertension in her brother, father, mother, paternal grandmother, and sister; Lung cancer in her paternal grandmother; Stroke in her father.    ROS:  Please see the history of present illness. All other systems are reviewed and  Negative to the above problem except as noted.    PHYSICAL EXAM: VS:  BP 116/70   Pulse 61   Ht 4\' 11"  (1.499 m)   Wt 133 lb 9.6 oz (60.6 kg)   BMI 26.98 kg/m   GEN: Well nourished, well developed, in no acute distress  HEENT: normal  Neck: no JVD, carotid bruits, or masses Cardiac: RRR; no  murmurs  no LE edema  Respiratory:  clear to auscultation bilaterally GI: soft, nontender, nondistended, + BS  No hepatomegaly  MS: no deformity Moving all extremities   Skin: warm and dry, no rash Neuro:  Strength and sensation are intact Psych: euthymic mood, full affect   EKG:  EKG is ordered today.  SR 61 bpm     Carotid USN   03/2017   Heterogeneous plaque, bilaterally. RICA stenosis, now in the 1-39% range. Essentially stable 78-58% LICA stenosis, per peak systolic velocities. Normal subclavian arteries, bilaterally. Patent vertebral arteries with antegrade flow. f/u PRN  Lipid Panel    Component Value Date/Time   CHOL 218 (H) 10/23/2020 0938   TRIG 120.0 10/23/2020 0938   TRIG 131 08/02/2006 0853   HDL 72.50 10/23/2020 0938   CHOLHDL 3 10/23/2020 0938   VLDL 24.0 10/23/2020 0938   LDLCALC 121 (H) 10/23/2020 0938   LDLCALC 111 (H) 04/16/2020 0927   LDLDIRECT 138.2 10/22/2010 0944      Wt Readings from Last 3 Encounters:  12/28/20 133 lb 9.6 oz (60.6 kg)  12/22/20 134 lb (60.8 kg)  10/13/20 127 lb 9.6 oz (57.9 kg)      ASSESSMENT AND PLAN:  1  CV dz   Will set up for repeat USN  Contnue on ASA    Will work to get lipids down  2  HL   Problems tolerating statins   Would back down on Crestor to 5 mg qod   Add Repatha  Follow up lipids later this spring   3   HTN  BP is good      4  Ortho  Consider eval by sports med for exercises   Follows in ortho  Plan for f/u in cliic in 1 year    Current medicines are reviewed at length with the patient today.  The patient does not have concerns regarding medicines.  Signed, Dorris Carnes, MD  12/28/2020 2:30 PM    [facility] [address], [place], [place]  [address] Phone: [phone]; Fax: [phone]

## 2020-12-28 NOTE — Assessment & Plan Note (Signed)
C/w msk strain, for tizandine prn, to f/u any worsening symptoms or concerns

## 2020-12-28 NOTE — Assessment & Plan Note (Signed)
.   BP Readings from Last 3 Encounters:  12/28/20 116/70  12/22/20 128/72  10/13/20 (!) 168/82   Stable, pt to continue medical treatment avapro, normodyne

## 2020-12-28 NOTE — Assessment & Plan Note (Signed)
Lab Results  Component Value Date   HGBA1C 5.9 10/23/2020   Stable, pt to continue current medical treatment  - diet

## 2020-12-28 NOTE — Patient Instructions (Signed)
Medication Instructions:  Decrease your Crestor to 5 mg every other day.   Will plan to start [place] with the Pharmacist.    *If you need a refill on your cardiac medications before your next appointment, please call your pharmacy*   Lab Work: Fasting lipids and hepatic panel in 2-3 months after starting Repatha.   If you have labs (blood work) drawn today and your tests are completely normal, you will receive your results only by: [name] MyChart Message (if you have MyChart) OR . A paper copy in the mail If you have any lab test that is abnormal or we need to change your treatment, we will call you to review the results.   Testing/Procedures: Your physician has requested that you have a carotid duplex. This test is an ultrasound of the carotid arteries in your neck. It looks at blood flow through these arteries that supply the brain with blood. Allow one hour for this exam. There are no restrictions or special instructions.     Follow-Up: At [facility], you and your health needs are our priority.  As part of our continuing mission to provide you with exceptional heart care, we have created designated Provider Care Teams.  These Care Teams include your primary Cardiologist (physician) and Advanced Practice Providers (APPs -  Physician Assistants and Nurse Practitioners) who all work together to provide you with the care you need, when you need it.  We recommend signing up for the patient portal called "MyChart".  Sign up information is provided on this After Visit Summary.  MyChart is used to connect with patients for Virtual Visits (Telemedicine).  Patients are able to view lab/test results, encounter notes, upcoming appointments, etc.  Non-urgent messages can be sent to your provider as well.   To learn more about what you can do with MyChart, go to [email or web address].    Your next appointment:   6 month(s)  The format for your next appointment:   In Person  Provider:    Dorris Carnes, MD   Other Instructions Referral to Pharmacist for [place].

## 2020-12-29 ENCOUNTER — Telehealth: Payer: Self-pay | Admitting: *Deleted

## 2020-12-29 NOTE — Telephone Encounter (Signed)
Former Dr.Kendall patient) patient called stating the compound estradiol was doing great with the first round, she picked up a refill and noticed extreme burning this time. Patient said she stopped the medication she asked if you have any other recommend to help vaginal dryness? She willing to try anything new Rx or OTC. Please advise

## 2020-12-29 NOTE — Telephone Encounter (Signed)
Vaginal estrogen can burn but if she has been using consistently it should not be burning. If she has not used it in a while the tissue may be inflamed/irritated and will improve with continued use. Or she can try OTC Replens 2-3 times per week or use coconut oil and apply externally (can do internally as well but this is difficult to do).

## 2021-01-01 ENCOUNTER — Ambulatory Visit (HOSPITAL_COMMUNITY)
Admission: RE | Admit: 2021-01-01 | Discharge: 2021-01-01 | Disposition: A | Discharge status: Home / Self Care | Payer: Medicare Other | Source: Ambulatory Visit | Attending: Cardiology | Admitting: Cardiology

## 2021-01-01 ENCOUNTER — Other Ambulatory Visit: Payer: Self-pay | Admitting: Internal Medicine

## 2021-01-01 ENCOUNTER — Other Ambulatory Visit: Payer: Self-pay

## 2021-01-01 DIAGNOSIS — I6523 Occlusion and stenosis of bilateral carotid arteries: Secondary | ICD-10-CM

## 2021-01-01 DIAGNOSIS — E785 Hyperlipidemia, unspecified: Secondary | ICD-10-CM | POA: Diagnosis not present

## 2021-01-01 DIAGNOSIS — Z79899 Other long term (current) drug therapy: Secondary | ICD-10-CM | POA: Insufficient documentation

## 2021-01-01 DIAGNOSIS — I10 Essential (primary) hypertension: Secondary | ICD-10-CM | POA: Diagnosis not present

## 2021-01-01 DIAGNOSIS — I739 Peripheral vascular disease, unspecified: Secondary | ICD-10-CM | POA: Diagnosis not present

## 2021-01-01 DIAGNOSIS — E559 Vitamin D deficiency, unspecified: Secondary | ICD-10-CM | POA: Insufficient documentation

## 2021-01-08 ENCOUNTER — Telehealth: Payer: Self-pay | Admitting: Internal Medicine

## 2021-01-08 NOTE — Progress Notes (Signed)
  Chronic Care Management   Note  01/08/2021 Name: Sharon Paul MRN: [id number] DOB: May 13, 1946  Sharon Paul is a 75 y.o. year old female who is a primary care patient of Plotnikov, Evie Lacks, MD. I reached out to [name] by phone today in response to a referral sent by [name] PCP, Plotnikov, Evie Lacks, MD.   [name] was given information about Chronic Care Management services today including:  1. CCM service includes personalized support from designated clinical staff supervised by her physician, including individualized plan of care and coordination with other care providers 2. 24/7 contact phone numbers for assistance for urgent and routine care needs. 3. Service will only be billed when office clinical staff spend 20 minutes or more in a month to coordinate care. 4. Only one practitioner may furnish and bill the service in a calendar month. 5. The patient may stop CCM services at any time (effective at the end of the month) by phone call to the office staff.   Patient agreed to services and verbal consent obtained.   Follow up plan:   [place]

## 2021-01-13 ENCOUNTER — Ambulatory Visit (INDEPENDENT_AMBULATORY_CARE_PROVIDER_SITE_OTHER): Payer: Medicare Other | Admitting: Pharmacist

## 2021-01-13 ENCOUNTER — Other Ambulatory Visit: Payer: Self-pay

## 2021-01-13 DIAGNOSIS — T466X5A Adverse effect of antihyperlipidemic and antiarteriosclerotic drugs, initial encounter: Secondary | ICD-10-CM | POA: Diagnosis not present

## 2021-01-13 DIAGNOSIS — G720 Drug-induced myopathy: Secondary | ICD-10-CM | POA: Insufficient documentation

## 2021-01-13 DIAGNOSIS — E785 Hyperlipidemia, unspecified: Secondary | ICD-10-CM

## 2021-01-13 NOTE — Progress Notes (Signed)
Patient ID: Sharon Paul                 DOB: [birth year]                    MRN: [id number]     HPI: Sharon Paul is a 75 y.o. female patient referred to lipid clinic by Dr Harrington Challenger. PMH is significant for CAD with chest CT in 2007 showing mild coronary artery calcifications, PVD, and HLD with statin intolerance. At last visit with Dr Harrington Challenger on 5/2, rosuvastatin dose was decreased to 5mg  every other day and pt was referred to lipid clinic to discuss PCSK9i therapy.  Pt presents today in good spirits. Still taking rosuvastatin 10mg  QOD. Did purchase CoQ10 100mg  capsules, has not started taking yet. Reports muscle soreness like someone beat her up on statin therapy. Similar reaction when she took atorvastatin in the past. She eats a heart healthy diet overall and [facility]  Current Medications: rosuvastatin 10mg  QOD Intolerances: rosuvastatin 10mg  daily and QOD, atorvastatin 10mg  daily Risk Factors: CAD, PVD LDL goal: 70mg /dL  Diet: 24 hour recall - Breakfast - coffee with cream and sugar, English muffin - whole wheat with piece of cheese, sometimes will have an egg/fruit or lactose-free yogurt. Lunch - chickpea soup, 1/2 tuna fish salad, some chips and tea Dinner - going to [place] for her niece's birthday. Likes chicken and rice with salad. Likes chicken, fish, and [place]. Eats steak occasionally.  Exercise[facility] [employer] [facility]  Family History: Glaucoma in her brother and mother; Heart disease in her father and mother; Hypertension in her brother, father, mother, paternal grandmother, and sister; Lung cancer in her paternal grandmother; Stroke in her father and mother.  Social History: Quit smoking about 50 years ago. She has never used smokeless tobacco. She reports current alcohol use. She reports that she does not use drugs.   Labs: 10/23/20: TC 218, TG 120, HDL 72.5, LDL 121 (rosuvastatin 10mg  every other day)  Past Medical History:   Diagnosis Date  . Celiac sprue 2007  . CVD (cardiovascular disease)   . Foot fracture, left 2011  . GERD (gastroesophageal reflux disease)   . Hyperlipidemia   . Osteopenia 04/2019   T score -1.9 FRAX 10% / 1.9%.  Stable from prior DEXA  . PVD (peripheral vascular disease) (HCC)    mild carotid ? fibromuscular dysplasia  . Shingles   . Urinary incontinence     Current Outpatient Medications on File Prior to Visit  Medication Sig Dispense Refill  . acetaminophen (TYLENOL) 500 MG tablet Take 500 mg by mouth every 6 (six) hours as needed.    [name] aspirin 81 MG tablet Take 81 mg by mouth daily.    [name] azelastine (ASTELIN) 0.1 % nasal spray Place 2 sprays into both nostrils 2 (two) times daily. 30 mL 0  . Cholecalciferol 2000 UNITS TABS Take 1 tablet by mouth daily.    . diazepam (VALIUM) 5 MG tablet Take 5 mg by mouth 2 (two) times daily as needed.    . diphenoxylate-atropine (LOMOTIL) 2.5-0.025 MG tablet Take 1-2 tablets by mouth 4 (four) times daily as needed for diarrhea or loose stools. 60 tablet 0  . FLUoxetine (PROZAC) 10 MG capsule TAKE 1 CAPSULE BY MOUTH  DAILY 90 capsule 3  . fluticasone (FLONASE) 50 MCG/ACT nasal spray USE 2 SPRAYS NASALLY DAILY 48 g 3  . irbesartan (AVAPRO) 300 MG tablet Take 0.5  tablets (150 mg total) by mouth 2 (two) times daily. 90 tablet 3  . labetalol (NORMODYNE) 300 MG tablet TAKE 1 TABLET BY MOUTH  TWICE DAILY 180 tablet 3  . LORazepam (ATIVAN) 0.5 MG tablet TAKE 1 TABLET BY MOUTH TWICE A DAY (Patient taking differently: 0.5 mg as needed.) 60 tablet 3  . pantoprazole (PROTONIX) 40 MG tablet TAKE 1 TABLET BY MOUTH  DAILY 90 tablet 3  . rosuvastatin (CRESTOR) 5 MG tablet Take 5 mg by mouth every other day. 30 tablet 3  . valACYclovir (VALTREX) 1000 MG tablet Take 2 tablets at the earliest onset of cold sore symptoms.  Repeat with 2 tablets 12 hours later 12 tablet 1   No current facility-administered medications on file prior to visit.    Allergies   Allergen Reactions  . Amlodipine     HA  . Augmentin [Amoxicillin-Pot Clavulanate]     diarrhea  . Clarithromycin     REACTION: gi upset. Can take Zpac  . Codeine Nausea And Vomiting    Can take Prom-cod syr  . Esomeprazole Magnesium     REACTION: side pain  . Hydrochlorothiazide     REACTION: leg cramps, dehydration  . Iohexol      Desc: PT HAS SWELLING TO FACE,LIPS AND THROAT WITH CONTRAST DYE   . Kapidex [Dexlansoprazole]     rash  . Losartan Potassium     REACTION: HA  . Olmesartan Medoxomil     REACTION: weak legs  . Ramipril     Per pt: unknown reaction  . Risedronate Sodium     Per pt: unknown reaction    Assessment/Plan:  1. Hyperlipidemia - LDL 121 on rosuvastatin 10mg  QOD which she is experiencing muscle pain on, above goal < 70 due to hx of CAD. Will decrease rosuvastatin to 5mg  - pt prefers to try daily instead of QOD dosing at first. Advised her she can change to QOD or add on CoQ10 200mg  daily to see if either help with statin tolerability. Also discussed PCSK9i therapy today which pt is willing to try. Will start Repatha 140mg  Q2W - prior authorization approved through 07/16/21. Pt unsure if she will qualify for grant but will check household income and let us know. Will schedule f/u labs once pt is on stable lipid lowering therapy.  Sharon Paul E. Orvin Netter, PharmD, BCACP, [place] [address], [place], [place] [address] Phone: [phone]; Fax: [phone] 01/13/2021 3:09 PM  Left message for pt to see which pharmacy she would like Repatha sent to. Will also need to schedule follow up labs.

## 2021-01-13 NOTE — Patient Instructions (Signed)
Your LDL is 121 and your goal is < 70  Decrease your rosuvastatin to 5mg  daily. You can also try every other day dosing, or try adding on CoQ10 200mg  daily. Our goal is for you to take whatever your highest tolerated dose of rosuvastatin is without it causing muscle aches and cramping  I will submit information to your insurance to see if they cover Repatha injections. They are given once every 2 weeks in the fatty tissue of your stomach and lower your LDL by 60% and help to prevent heart attacks and strokes. Store the medication in the fridge.  I'll call you when your insurance has approved Repatha. Check your household income when you get home - I can see if you qualify for the grant. Otherwise, the Repatha copay is $47/1 month or $131/3 months

## 2021-01-14 ENCOUNTER — Telehealth: Payer: Self-pay | Admitting: Pharmacist

## 2021-01-14 ENCOUNTER — Other Ambulatory Visit: Payer: Self-pay | Admitting: Internal Medicine

## 2021-01-14 MED ORDER — REPATHA SURECLICK 140 MG/ML ~~LOC~~ SOAJ: 1.0000 "pen " | SUBCUTANEOUS | 11 refills | Status: DC

## 2021-01-14 NOTE — Telephone Encounter (Signed)
Spoke with pt regarding Repatha approval, rx sent to pharmacy. She will give first dose on 6/1, moved out labs to 7/18 to assess efficacy. She hasn't calculate household income but will let us know an update with this info if she would like [place] to apply for the [facility] grant for her. Otherwise, she is ok with ~$45 copay for now.

## 2021-01-21 ENCOUNTER — Other Ambulatory Visit: Payer: Self-pay

## 2021-01-21 ENCOUNTER — Ambulatory Visit (INDEPENDENT_AMBULATORY_CARE_PROVIDER_SITE_OTHER): Payer: Medicare Other

## 2021-01-21 DIAGNOSIS — F411 Generalized anxiety disorder: Secondary | ICD-10-CM

## 2021-01-21 DIAGNOSIS — M19041 Primary osteoarthritis, right hand: Secondary | ICD-10-CM

## 2021-01-21 DIAGNOSIS — I10 Essential (primary) hypertension: Secondary | ICD-10-CM | POA: Diagnosis not present

## 2021-01-21 DIAGNOSIS — E785 Hyperlipidemia, unspecified: Secondary | ICD-10-CM

## 2021-01-21 DIAGNOSIS — J3081 Allergic rhinitis due to animal (cat) (dog) hair and dander: Secondary | ICD-10-CM

## 2021-01-21 DIAGNOSIS — K219 Gastro-esophageal reflux disease without esophagitis: Secondary | ICD-10-CM

## 2021-01-21 NOTE — Progress Notes (Deleted)
Chronic Care Management Pharmacy Note  01/21/2021 Name:  Mili Piltz MRN:  [id number] DOB:  04/22/1946  Subjective: Sharon Paul is an 75 y.o. year old female who is a primary patient of Plotnikov, Evie Lacks, MD.  The CCM team was consulted for assistance with disease management and care coordination needs.    Engaged with patient face to face for initial visit in response to provider referral for pharmacy case management and/or care coordination services.   Consent to Services:  The patient was given the following information about Chronic Care Management services today, agreed to services, and gave verbal consent: 1. CCM service includes personalized support from designated clinical staff supervised by the primary care provider, including individualized plan of care and coordination with other care providers 2. 24/7 contact phone numbers for assistance for urgent and routine care needs. 3. Service will only be billed when office clinical staff spend 20 minutes or more in a month to coordinate care. 4. Only one practitioner may furnish and bill the service in a calendar month. 5.The patient may stop CCM services at any time (effective at the end of the month) by phone call to the office staff. 6. The patient will be responsible for cost sharing (co-pay) of up to 20% of the service fee (after annual deductible is met). Patient agreed to services and consent obtained.  Patient Care Team: Plotnikov, Evie Lacks, MD as PCP - General Armbruster, Carlota Raspberry, MD as Consulting Physician (Gastroenterology) Justice Britain, MD as Consulting Physician (Orthopedic Surgery) [facility] as Consulting Physician (Ophthalmology) Delice Bison Darnelle Maffucci, [facility] (Pharmacist) Tomasa Blase, [facility] as Pharmacist (Pharmacist)  Recent office visits: 12/22/2020 - Dr. Jenny Reichmann - dysuria and low back pain - tizanidine started for back pain  10/13/2020 - PCP visit - presented for annual exam - no  changes at this time  04/08/2020 - PCP visit - eval for right shoulder pain - started low dose meloxicam prn   Recent consult visits: 01/13/2021 - Jinny Blossom Supple PharmD  - lipid management - reduced crestor to 34m daily (muscle pains) - plan will be for patient to start repatha [id number]  12/28/2020 - Cardiology - Dr. RoHarrington Challenger recommended reduction of crestor to 3m28mOD, start repatha  09/15/2020 - Ortho - f/u for left elbow fracture  09/01/2020 - Ortho - eval for left elbow fracture 07/02/2020 - Ophthalmology - Dr. GroOrpha Bursits: Yes - 08/24/2020 - fracture of left elbow after a fall from walking to get her mail   Objective:  Lab Results  Component Value Date   CREATININE 0.82 10/23/2020   BUN 14 10/23/2020   GFR 70.67 10/23/2020   GFRNONAA 71 04/16/2020   GFRAA 82 04/16/2020   NA 138 10/23/2020   K 4.2 10/23/2020   CALCIUM 10.1 10/23/2020   CO2 31 10/23/2020   GLUCOSE 97 10/23/2020    Lab Results  Component Value Date/Time   HGBA1C 5.9 10/23/2020 09:38 AM   HGBA1C 6.0 10/10/2019 09:28 AM   GFR 70.67 10/23/2020 09:38 AM   GFR 59.07 (L) 12/17/2019 03:57 PM    Last diabetic Eye exam:  No results found for: HMDIABEYEEXA  Last diabetic Foot exam:  No results found for: HMDIABFOOTEX   Lab Results  Component Value Date   CHOL 218 (H) 10/23/2020   HDL 72.50 10/23/2020   LDLCALC 121 (H) 10/23/2020   LDLDIRECT 138.2 10/22/2010   TRIG 120.0 10/23/2020   CHOLHDL 3 10/23/2020    Hepatic Function Latest  Ref Rng & Units 10/23/2020 04/16/2020 10/10/2019  Total Protein 6.0 - 8.3 g/dL 6.7 6.6 6.7  Albumin 3.5 - 5.2 g/dL 4.5 - 4.4  AST 0 - 37 U/L $Remo'16 19 16  'PTYjJ$ ALT 0 - 35 U/L $Remo'17 17 14  'eDbKa$ Alk Phosphatase 39 - 117 U/L 91 - 84  Total Bilirubin 0.2 - 1.2 mg/dL 0.5 0.3 0.5  Bilirubin, Direct 0.0 - 0.3 mg/dL - - 0.1    Lab Results  Component Value Date/Time   TSH 2.56 10/23/2020 09:38 AM   TSH 1.79 04/16/2020 09:27 AM    CBC Latest Ref Rng & Units 10/23/2020 10/10/2019 02/21/2018   WBC 4.0 - 10.5 K/uL 3.9(L) 3.3(L) 4.0  Hemoglobin 12.0 - 15.0 g/dL 12.6 12.5 12.1  Hematocrit 36.0 - 46.0 % 38.1 37.8 35.7(L)  Platelets 150.0 - 400.0 K/uL 254.0 251.0 245.0    Lab Results  Component Value Date/Time   VD25OH 32 04/13/2020 03:27 PM   VD25OH 29.26 (L) 12/17/2019 03:57 PM   VD25OH 31 05/27/2015 11:02 AM    Clinical ASCVD: No  The 10-year ASCVD risk score Mikey Bussing DC Jr., et al., 2013) is: 16%   Values used to calculate the score:     Age: 25 years     Sex: Female     Is Non-Hispanic African American: No     Diabetic: No     Tobacco smoker: No     Systolic Blood Pressure: 226 mmHg     Is BP treated: Yes     HDL Cholesterol: 72.5 mg/dL     Total Cholesterol: 218 mg/dL    Depression screen [facility] 2/9 12/22/2020 10/13/2020 06/11/2020  Decreased Interest 0 0 0  Down, Depressed, Hopeless 0 0 0  PHQ - 2 Score 0 0 0  Some recent data might be hidden     Social History   Tobacco Use  Smoking Status Former Smoker  . Quit date: 02/11/1970  . Years since quitting: 50.9  Smokeless Tobacco Never Used   BP Readings from Last 3 Encounters:  12/28/20 116/70  12/22/20 128/72  10/13/20 (!) 168/82   Pulse Readings from Last 3 Encounters:  12/28/20 61  12/22/20 63  10/13/20 69   Wt Readings from Last 3 Encounters:  12/28/20 133 lb 9.6 oz (60.6 kg)  12/22/20 134 lb (60.8 kg)  10/13/20 127 lb 9.6 oz (57.9 kg)   BMI Readings from Last 3 Encounters:  12/28/20 26.98 kg/m  12/22/20 29.00 kg/m  10/13/20 27.61 kg/m    Assessment/Interventions: Review of patient past medical history, allergies, medications, health status, including review of consultants reports, laboratory and other test data, was performed as part of comprehensive evaluation and provision of chronic care management services.   SDOH:  (Social Determinants of Health) assessments and interventions performed: {yes/no:20286}  SDOH Screenings   Alcohol Screen: Low Risk   . Last Alcohol Screening Score  (AUDIT): 2  Depression (PHQ2-9): Low Risk   . PHQ-2 Score: 0  Financial Resource Strain: Low Risk   . Difficulty of Paying Living Expenses: Not hard at all  Food Insecurity: No Food Insecurity  . Worried About Charity fundraiser in the Last Year: Never true  . Ran Out of Food in the Last Year: Never true  Housing: Low Risk   . Last Housing Risk Score: 0  Physical Activity: Sufficiently Active  . Days of Exercise per Week: 7 days  . Minutes of Exercise per Session: 40 min  Social Connections: Unknown  . Frequency  of Communication with Friends and Family: More than three times a week  . Frequency of Social Gatherings with Friends and Family: Once a week  . Attends Religious Services: Patient refused  . Active Member of Clubs or Organizations: Patient refused  . Attends Archivist Meetings: Patient refused  . Marital Status: Married  Stress: No Stress Concern Present  . Feeling of Stress : Not at all  Tobacco Use: Medium Risk  . Smoking Tobacco Use: Former Smoker  . Smokeless Tobacco Use: Never Used  Transportation Needs: No Transportation Needs  . Lack of Transportation (Medical): No  . Lack of Transportation (Non-Medical): No    CCM Care Plan  Allergies  Allergen Reactions  . Amlodipine     HA  . Augmentin [Amoxicillin-Pot Clavulanate]     diarrhea  . Clarithromycin     REACTION: gi upset. Can take Zpac  . Codeine Nausea And Vomiting    Can take Prom-cod syr  . Esomeprazole Magnesium     REACTION: side pain  . Hydrochlorothiazide     REACTION: leg cramps, dehydration  . Iohexol      Desc: PT HAS SWELLING TO FACE,LIPS AND THROAT WITH CONTRAST DYE   . Kapidex [Dexlansoprazole]     rash  . Losartan Potassium     REACTION: HA  . Olmesartan Medoxomil     REACTION: weak legs  . Ramipril     Per pt: unknown reaction  . Risedronate Sodium     Per pt: unknown reaction    Medications Reviewed Today    Reviewed by Biagio Borg, MD (Physician) on 12/28/20  at 2016  Med List Status: <None>  Medication Order Taking? Sig Documenting Provider Last Dose Status Informant  acetaminophen (TYLENOL) 500 MG tablet [id number] Yes Take 500 mg by mouth every 6 (six) hours as needed. [provider] Taking Active Self  aspirin 81 MG tablet [id number] Yes Take 81 mg by mouth daily. [provider] Taking Active   azelastine (ASTELIN) 0.1 % nasal spray [id number] Yes Place 2 sprays into both nostrils 2 (two) times daily. Ok Edwards, PA-C Taking Active   Cholecalciferol 2000 UNITS TABS [id number] Yes Take 1 tablet by mouth daily. [provider] Taking Active   ciprofloxacin (CIPRO) 500 MG tablet [id number] Yes Take 1 tablet (500 mg total) by mouth 2 (two) times daily for 10 days. Biagio Borg, MD  Active   diazepam (VALIUM) 5 MG tablet [id number] Yes Take 5 mg by mouth 2 (two) times daily as needed. [provider] Taking Active   diphenoxylate-atropine (LOMOTIL) 2.5-0.025 MG tablet [id number] Yes Take 1-2 tablets by mouth 4 (four) times daily as needed for diarrhea or loose stools. Biagio Borg, MD Taking Active   FLUoxetine (PROZAC) 10 MG capsule [id number] Yes TAKE 1 CAPSULE BY MOUTH  DAILY Plotnikov, Evie Lacks, MD Taking Active   fluticasone (FLONASE) 50 MCG/ACT nasal spray [id number] Yes USE 2 SPRAYS NASALLY DAILY Plotnikov, Evie Lacks, MD Taking Active   irbesartan (AVAPRO) 300 MG tablet [id number] Yes Take 0.5 tablets (150 mg total) by mouth 2 (two) times daily. Plotnikov, Evie Lacks, MD Taking Active   labetalol (NORMODYNE) 300 MG tablet [id number] Yes TAKE 1 TABLET BY MOUTH  TWICE DAILY Plotnikov, Evie Lacks, MD Taking Active   LORazepam (ATIVAN) 0.5 MG tablet [id number] Yes TAKE 1 TABLET BY MOUTH TWICE A DAY  Patient taking differently: 0.5 mg as needed.   Plotnikov, Evie Lacks, MD Taking Active   pantoprazole (  PROTONIX) 40 MG tablet [id number] Yes TAKE 1 TABLET BY MOUTH  DAILY Plotnikov, Evie Lacks, MD Taking Active   rosuvastatin  (CRESTOR) 5 MG tablet [id number]  Take 5 mg by mouth every other day. Fay Records, MD  Active   valACYclovir (VALTREX) 1000 MG tablet [id number] Yes Take 2 tablets at the earliest onset of cold sore symptoms.  Repeat with 2 tablets 12 hours later Fontaine, Belinda Block, MD Taking Active           Patient Active Problem List   Diagnosis Date Noted  . Statin myopathy 01/13/2021  . Dysuria 12/22/2020  . Low back pain 12/22/2020  . Closed fracture of left elbow 08/31/2020  . Dyspnea 09/08/2019  . COVID-19 virus infection 09/08/2019  . Acute sinus infection 11/12/2018  . Shingles outbreak 09/04/2018  . Hip pain 09/04/2018  . LUQ pain 07/19/2018  . Bloating 07/19/2018  . 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 Viral URI with cough 04/25/2017  . Urinary frequency 11/20/2016  . Lactose intolerance in adult 09/13/2016  . Cough 12/22/2014  . 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 Well adult exam 12/04/2013  . Back pain, thoracic 08/13/2012  . 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 Urinary incontinence   . Right shoulder pain 12/13/2011  . Sciatica of right side 07/06/2011  . Hyperglycemia 06/07/2011  . Abdominal pain 04/21/2011  . WEAKNESS 09/21/2010  . CAROTID OCCLUSIVE DISEASE 09/07/2010  . WRIST PAIN 06/25/2010  . Diarrhea 10/27/2009  . DIZZINESS 07/28/2009  . TOBACCO USE, QUIT 07/28/2009  . Pain in joint 05/26/2009  . Lumbago 11/05/2008  . Irritable bowel syndrome 06/27/2008  . GASTRITIS, HX OF 06/27/2008  . Rash and other nonspecific skin eruption 06/02/2008  . Dyslipidemia 04/23/2008  . Generalized anxiety disorder 04/23/2008  . DIVERTICULOSIS, COLON 04/23/2008  . GERD 04/02/2008  . Allergic rhinitis 12/11/2007  . Vitamin D deficiency 08/15/2007  . Peripheral vascular disease ([place]) 08/15/2007  . Essential hypertension 03/24/2007    Immunization History  Administered Date(s) Administered  . Fluad Quad(high Dose 65+) 06/06/2019, 06/11/2020  . Influenza Split 07/04/2012  . Influenza Whole  07/02/2008, 06/01/2010  . Influenza, High Dose Seasonal PF 05/31/2016, 05/26/2017, 05/29/2018  . Influenza,inj,Quad PF,6+ Mos 06/02/2015  . Influenza,inj,quad, With Preservative 05/29/2017, 05/29/2018, 05/30/2019  . Influenza-Unspecified 06/16/2013, 05/29/2014  . PFIZER(Purple Top)SARS-COV-2 Vaccination 11/13/2019, 12/04/2019, 07/11/2020  . Pneumococcal Conjugate-13 01/14/2015  . Pneumococcal Polysaccharide-23 12/04/2013  . Td 08/02/2000  . Tdap 10/06/2015  . Zoster 09/21/2009  . Zoster Recombinat (Shingrix) 05/11/2020    Conditions to be addressed/monitored:  {USCCMDZASSESSMENTOPTIONS:23563}  There are no care plans that you recently modified to display for this patient.     Medication Assistance: {MEDASSISTANCEINFO:25044}  Patient's preferred pharmacy is:  [id number] - [place], [place] [address] Phone: [phone] Fax: [phone]  [place], [address], [address] [place], [place] [address] Phone: [phone] Fax: [place], [place] [address] Phone: [phone] Fax: [phone]   Uses pill box? {Yes or If no, why not?:20788} Pt endorses ***% compliance  Care Plan and Follow Up Patient Decision:  {FOLLOWUP:24991}  Plan: {CM FOLLOW UP PLAN:25073}  ***  .Current Barriers:  . {pharmacybarriers:24917}  Pharmacist Clinical Goal(s):  [name] Patient will {PHARMACYGOALCHOICES:24921} through collaboration with PharmD and provider.   Interventions: . 1:1 collaboration with Plotnikov,  Evie Lacks, MD regarding development and update of comprehensive plan of care as evidenced by provider attestation and co-signature . Inter-disciplinary care team collaboration (see longitudinal plan of care) . Comprehensive medication review performed; medication  list updated in electronic medical record  Hypertension (BP goal <130/80) -Controlled -Current treatment: . Labetolol $RemoveBef'300mg'TPXZEmHxox$  twice daily  . Irbesartan $RemoveBefo'300mg'OuAHGAASeyw$  - 1/2 tablet twice daily  -Medications previously tried: Amlodipine, hctz, losartan, olmesartan, ramipril   -Current home readings: *** -Current dietary habits: *** -Current exercise habits: walks at [facility] 3 times weekly  -{ACTIONS;DENIES/REPORTS:21021675::"Denies"} hypotensive/hypertensive symptoms -Educated on {CCM BP Counseling:25124} -Counseled to monitor BP at home ***, document, and provide log at future appointments -{CCMPHARMDINTERVENTION:25122}  Hyperlipidemia: (LDL goal < 100) -Not ideally controlled  -LDL 121 mg/dL  (10/23/2020) -Current treatment: . Crestor $RemoveB'5mg'AxDRrDrg$  every other day  . Repatha $RemoveB'140mg'WANgurau$  every 2 weeks . ASA $Rem'81mg'vnXW$  daily   -Medications previously tried: atorvastatin , vascepa  -Current dietary patterns: *** -Current exercise habits: *** -Educated on {CCM HLD Counseling:25126} -{CCMPHARMDINTERVENTION:25122}  Depression/Anxiety (Goal: mood control / prevention of anxiety attack) -{US controlled/uncontrolled:25276} -Current treatment: . Fluoxetine $RemoveBefo'10mg'NLZSnipLxTx$  daily  . Diazepam $RemoveBe'5mg'NnVAWlAik$  twice daily as needed ? - patient unsure if she is taking   -Medications previously tried/failed: *** -PHQ9: 0 -GAD7: 3 -Educated on {CCM mental health counseling:25127} -{CCMPHARMDINTERVENTION:25122}  Osteoarthritis - Right Hip (Goal: Pain control) -{US controlled/uncontrolled:25276} -Current treatment  . APAP $Remo'500mg'pzkJc$  every 6 hours as needed  -Medications previously tried: meloxicam, IBU, tramadol, oxycodone   -{CCMPHARMDINTERVENTION:25122}   GERD / diarrhea (Goal: acid control / prevention of flares ) -{US controlled/uncontrolled:25276} -Current treatment  . Pantoprazole $RemoveBefore'40mg'GzQpQbGmWmMCz$  daily  . Dicyclomine $RemoveBefor'10mg'baVsMVbMaFKZ$  - up to 4 times daily prn  . Lomotil 2.5-0.$RemoveBeforeD'025mg'MHFNvzaAMcOUes$  - 1-2 tablets 4 times daily as needed  -Medications previously tried: omeprazole,  hyoscyamine, ranitidine  -{CCMPHARMDINTERVENTION:25122}   Allergic Rhinitis (Goal: Allergy control / treatment of flare) -{US controlled/uncontrolled:25276} -Current treatment  . Fexofenadine $RemoveBefore'180mg'dHLqNauvjUScX$  daily  . Fluticasone 50 mcg/act - 2 sprays into each nostril daily  -Medications previously tried: ***  -{CCMPHARMDINTERVENTION:25122}   Health Maintenance -Vaccine gaps: *** -Current therapy:  [name] Vit D - 2000 units daily  . Valacyclovir $RemoveBefore'1000mg'wIZUVFGphIXub$  - 2 tablets at the earliest onset of cold sore - than additional 2 tablets after  -Educated on {ccm supplement counseling:25128} -{CCM Patient satisfied:25129} -{CCMPHARMDINTERVENTION:25122}  Chart prep = 29 mins   Time with patient = 62 mins      Patient Goals/Self-Care Activities . Patient will:  - {pharmacypatientgoals:24919}  Follow Up Plan: {CM FOLLOW UP VWAQ:77373}

## 2021-01-21 NOTE — Patient Instructions (Addendum)
Visit Information   PATIENT GOALS:  Goals Addressed            This Visit's Progress   . Track and Manage My Blood Pressure-Hypertension       Timeframe:  Long-Range Goal Priority:  High Start Date: 01/21/2021                            Expected End Date: 07/24/2021                      Follow Up Date 04/23/2021   - check blood pressure weekly - write blood pressure results in a log or diary    Why is this important?    You won't feel high blood pressure, but it can still hurt your blood vessels.   High blood pressure can cause heart or kidney problems. It can also cause a stroke.   Making lifestyle changes like losing a little weight or eating less salt will help.   Checking your blood pressure at home and at different times of the day can help to control blood pressure.   If the doctor prescribes medicine remember to take it the way the doctor ordered.   Call the office if you cannot afford the medicine or if there are questions about it.     Notes:        Consent to CCM Services: Ms. Tsou was given information about Chronic Care Management services today including:  1. CCM service includes personalized support from designated clinical staff supervised by her physician, including individualized plan of care and coordination with other care providers 2. 24/7 contact phone numbers for assistance for urgent and routine care needs. 3. Service will only be billed when office clinical staff spend 20 minutes or more in a month to coordinate care. 4. Only one practitioner may furnish and bill the service in a calendar month. 5. The patient may stop CCM services at any time (effective at the end of the month) by phone call to the office staff. 6. The patient will be responsible for cost sharing (co-pay) of up to 20% of the service fee (after annual deductible is met).  Patient agreed to services and verbal consent obtained.   Patient verbalizes understanding of  instructions provided today and agrees to view in [place].   Telephone follow up appointment with care management team member scheduled for: The patient has been provided with contact information for the care management team and has been advised to call with any health related questions or concerns.   Tomasa Blase, PharmD Clinical Pharmacist, [place]    CLINICAL CARE PLAN: Patient Care Plan: CCM Pharmacy    Problem Identified: HTN, HLD, OA, Allergic Rhinitis, GERD, Depression / Anxiety   Priority: High  Onset Date: 01/21/2021    Long-Range Goal: Disease Management   Start Date: 01/21/2021  Expected End Date: 07/24/2021  This Visit's Progress: On track  Priority: High  Note:   .Current Barriers:  . Unable to independently monitor therapeutic efficacy . Unable to achieve control of LDL    Pharmacist Clinical Goal(s):  [name] Patient will verbalize ability to afford treatment regimen . achieve adherence to monitoring guidelines and medication adherence to achieve therapeutic efficacy . achieve control of LDL as evidenced by next lipid panel results  . maintain control of blood pressure as evidenced by home blood pressure readings being within range  through collaboration with PharmD  and provider.   Interventions: . 1:1 collaboration with Plotnikov, Evie Lacks, MD regarding development and update of comprehensive plan of care as evidenced by provider attestation and co-signature . Inter-disciplinary care team collaboration (see longitudinal plan of care) . Comprehensive medication review performed; medication list updated in electronic medical record  Hypertension (BP goal <130/80) -Controlled -Current treatment: . Labetolol 340m twice daily  . Irbesartan 3020m- 1/2 tablet twice daily  -Medications previously tried: Amlodipine, hctz, losartan, olmesartan, ramipril   -Current home readings: n/a - has BP cuff at home but has not been checking  -Current dietary habits:  tries to eat a sodium reduced diet  -[facility] habits[facility]  -Denies hypotensive/hypertensive symptoms -Educated on BP goals and benefits of medications for prevention of heart attack, stroke and kidney damage; Daily salt intake goal < 2300 mg; Exercise goal of 150 minutes per week; Importance of home blood pressure monitoring; Symptoms of hypotension and importance of maintaining adequate hydration; -Counseled to monitor BP at home once weekly, document, and provide log at future appointments -Counseled on diet and exercise extensively Recommended to continue current medication  Hyperlipidemia: (LDL goal < 100) -Not ideally controlled  -LDL 121 mg/dL  (10/23/2020) -Current treatment: . Crestor 87m56mvery other day  . Repatha 140m76mery 2 weeks . ASA 81mg70mly   . Co-Q-10 - 100mg 61my  -Medications previously tried: atorvastatin , vascepa  -Current dietary patterns: watching intake of fried and fatty foods -Current exercise habits: walking 3 times weekly at the local [employer] -Educated on Cholesterol goals;  Benefits of statin for ASCVD risk reduction; Importance of limiting foods high in cholesterol; Exercise goal of 150 minutes per week; Strategies to manage statin-induced myalgias; -Recommended to continue current medication Counseled on use of statin medication - patient plans to start taking crestor 87mg da3m - will reach out to discuss further reduction if she develops side effects with current dose  - will start reptha next month - blood work has been ordered   Depression/Anxiety (Goal: mood control / prevention of anxiety attack) -Controlled -Current treatment: . Fluoxetine 10mg da24m . Diazepam 87mg twic42maily as needed ? - patient unsure if she is taking - patient reports only having to use 2 doses within the last 6 months    -Medications previously tried/failed: lorazepam  -PHQ9: 0 -GAD7: 3 -Educated on Benefits of medication for symptom  control -Recommended to continue current medication  Osteoarthritis / Lumbago - Right Hip (Goal: Pain control) -Not ideally controlled -Current treatment  . APAP 500mg ever67mhours as needed  -Medications previously tried: meloxicam, IBU, tramadol, oxycodone   -Recommended for patient to trial voltaren gel on hip, agreeable, also plans to make appointment with sports medicine, has had injection in the past with success    GERD / diarrhea (Goal: acid control / prevention of flares ) -Controlled -Current treatment  . Pantoprazole 40mg daily38mDicyclomine 10mg - up t80mtimes daily prn  . Lomotil 2.5-0.0287mg - 1-2 tablets 4 times daily as needed  . Mylanta - 187mL q6h prn38mnly uses if she eats spicy foods  -Medications previously tried: omeprazole, hyoscyamine, ranitidine  -Counseled on diet and exercise extensively Recommended to continue current medication   Allergic Rhinitis (Goal: Allergy control / treatment of flare) -Controlled -Current treatment  . Fexofenadine 180mg daily  .44mticasone 50 mcg/act - 2 sprays into each nostril daily  -Medications previously tried: claritin, azelastine  -Recommended to  continue current medication   Health Maintenance -Current therapy:  [name] Vit D - 2000 units daily  . Align Prebiotic-Probiotic - 1 gummy daily  . Vit B12 3066mg - 1 tablet daily  . Calcium chewable - 1 chewable daily  . Multivitamin - 1 tablet daily  . Valacyclovir 10018m- 2 tablets at the earliest onset of cold sore - than additional 2 tablets after  -Educated on Herbal supplement research is limited and benefits usually cannot be proven Cost vs benefit of each product must be carefully weighed by individual consumer Supplements may interfere with prescription drugs -Patient is satisfied with current therapy and denies issues -Recommended to continue current medication  Patient Goals/Self-Care Activities . Patient will:  - take medications as prescribed check blood  pressure once weekly, document, and provide at future appointments  Follow Up Plan: Telephone follow up appointment with care management team member scheduled for: 3 months The patient has been provided with contact information for the care management team and has been advised to call with any health related questions or concerns.       Hypertension, Adult Hypertension is another name for high blood pressure. High blood pressure forces your heart to work harder to pump blood. This can cause problems over time. There are two numbers in a blood pressure reading. There is a top number (systolic) over a bottom number (diastolic). It is best to have a blood pressure that is below 120/80. Healthy choices can help lower your blood pressure, or you may need medicine to help lower it. What are the causes? The cause of this condition is not known. Some conditions may be related to high blood pressure. What increases the risk?  Smoking.  Having type 2 diabetes mellitus, high cholesterol, or both.  Not getting enough exercise or physical activity.  Being overweight.  Having too much fat, sugar, calories, or salt (sodium) in your diet.  Drinking too much alcohol.  Having long-term (chronic) kidney disease.  Having a family history of high blood pressure.  Age. Risk increases with age.  Race. You may be at higher risk if you are African American.  Gender. Men are at higher risk than women before age 5941After age 1386women are at higher risk than men.  Having obstructive sleep apnea.  Stress. What are the signs or symptoms?  High blood pressure may not cause symptoms. Very high blood pressure (hypertensive crisis) may cause: ? Headache. ? Feelings of worry or nervousness (anxiety). ? Shortness of breath. ? Nosebleed. ? A feeling of being sick to your stomach (nausea). ? Throwing up (vomiting). ? Changes in how you see. ? Very bad chest pain. ? Seizures. How is this  treated?  This condition is treated by making healthy lifestyle changes, such as: ? Eating healthy foods. ? Exercising more. ? Drinking less alcohol.  Your health care provider may prescribe medicine if lifestyle changes are not enough to get your blood pressure under control, and if: ? Your top number is above 130. ? Your bottom number is above 80.  Your personal target blood pressure may vary. Follow these instructions at home: Eating and drinking  If told, follow the DASH eating plan. To follow this plan: ? Fill one half of your plate at each meal with fruits and vegetables. ? Fill one fourth of your plate at each meal with whole grains. Whole grains include whole-wheat pasta, brown rice, and whole-grain bread. ? Eat or drink low-fat dairy products, such as skim  milk or low-fat yogurt. ? Fill one fourth of your plate at each meal with low-fat (lean) proteins. Low-fat proteins include fish, chicken without skin, eggs, beans, and tofu. ? Avoid fatty meat, cured and processed meat, or chicken with skin. ? Avoid pre-made or processed food.  Eat less than 1,500 mg of salt each day.  Do not drink alcohol if: ? Your doctor tells you not to drink. ? You are pregnant, may be pregnant, or are planning to become pregnant.  If you drink alcohol: ? Limit how much you use to:  0-1 drink a day for women.  0-2 drinks a day for men. ? Be aware of how much alcohol is in your drink. In the [place] one drink equals one 12 oz bottle of beer (355 mL), one 5 oz glass of wine (148 mL), or one 1 oz glass of hard liquor (44 mL).   Lifestyle  Work with your doctor to stay at a healthy weight or to lose weight. Ask your doctor what the best weight is for you.  Get at least 30 minutes of exercise most days of the week. This may include walking, swimming, or biking.  Get at least 30 minutes of exercise that strengthens your muscles (resistance exercise) at least 3 days a week. This may include lifting  weights or doing Pilates.  Do not use any products that contain nicotine or tobacco, such as cigarettes, e-cigarettes, and chewing tobacco. If you need help quitting, ask your doctor.  Check your blood pressure at home as told by your doctor.  Keep all follow-up visits as told by your doctor. This is important.   Medicines  Take over-the-counter and prescription medicines only as told by your doctor. Follow directions carefully.  Do not skip doses of blood pressure medicine. The medicine does not work as well if you skip doses. Skipping doses also puts you at risk for problems.  Ask your doctor about side effects or reactions to medicines that you should watch for. Contact a doctor if you:  Think you are having a reaction to the medicine you are taking.  Have headaches that keep coming back (recurring).  Feel dizzy.  Have swelling in your ankles.  Have trouble with your vision. Get help right away if you:  Get a very bad headache.  Start to feel mixed up (confused).  Feel weak or numb.  Feel faint.  Have very bad pain in your: ? Chest. ? Belly (abdomen).  Throw up more than once.  Have trouble breathing. Summary  Hypertension is another name for high blood pressure.  High blood pressure forces your heart to work harder to pump blood.  For most people, a normal blood pressure is less than 120/80.  Making healthy choices can help lower blood pressure. If your blood pressure does not get lower with healthy choices, you may need to take medicine. This information is not intended to replace advice given to you by your health care provider. Make sure you discuss any questions you have with your health care provider. Document Revised: 04/25/2018 Document Reviewed: 04/25/2018 Elsevier Patient Education  2021 [employer].

## 2021-01-21 NOTE — Progress Notes (Addendum)
Chronic Care Management Pharmacy Note  01/21/2021 Name:  Sharon Paul MRN:  [id number] DOB:  04/22/1946  Subjective: Sharon Paul is an 75 y.o. year old female who is a primary patient of Plotnikov, Evie Lacks, MD.  The CCM team was consulted for assistance with disease management and care coordination needs.    Engaged with patient face to face for initial visit in response to provider referral for pharmacy case management and/or care coordination services.   Consent to Services:  The patient was given the following information about Chronic Care Management services today, agreed to services, and gave verbal consent: 1. CCM service includes personalized support from designated clinical staff supervised by the primary care provider, including individualized plan of care and coordination with other care providers 2. 24/7 contact phone numbers for assistance for urgent and routine care needs. 3. Service will only be billed when office clinical staff spend 20 minutes or more in a month to coordinate care. 4. Only one practitioner may furnish and bill the service in a calendar month. 5.The patient may stop CCM services at any time (effective at the end of the month) by phone call to the office staff. 6. The patient will be responsible for cost sharing (co-pay) of up to 20% of the service fee (after annual deductible is met). Patient agreed to services and consent obtained.  Patient Care Team: Plotnikov, Evie Lacks, MD as PCP - General Armbruster, Carlota Raspberry, MD as Consulting Physician (Gastroenterology) Justice Britain, MD as Consulting Physician (Orthopedic Surgery) [facility] as Consulting Physician (Ophthalmology) Delice Bison Darnelle Maffucci, [facility] (Pharmacist) Tomasa Blase, [facility] as Pharmacist (Pharmacist)  Recent office visits: 12/22/2020 - Dr. Jenny Reichmann - dysuria and low back pain - tizanidine started for back pain  10/13/2020 - PCP visit - presented for annual exam - no  changes at this time  04/08/2020 - PCP visit - eval for right shoulder pain - started low dose meloxicam prn   Recent consult visits: 01/13/2021 - Jinny Blossom Supple PharmD  - lipid management - reduced crestor to 34m daily (muscle pains) - plan will be for patient to start repatha [id number]  12/28/2020 - Cardiology - Dr. RoHarrington Challenger recommended reduction of crestor to 3m28mOD, start repatha  09/15/2020 - Ortho - f/u for left elbow fracture  09/01/2020 - Ortho - eval for left elbow fracture 07/02/2020 - Ophthalmology - Dr. GroOrpha Bursits: Yes - 08/24/2020 - fracture of left elbow after a fall from walking to get her mail   Objective:  Lab Results  Component Value Date   CREATININE 0.82 10/23/2020   BUN 14 10/23/2020   GFR 70.67 10/23/2020   GFRNONAA 71 04/16/2020   GFRAA 82 04/16/2020   NA 138 10/23/2020   K 4.2 10/23/2020   CALCIUM 10.1 10/23/2020   CO2 31 10/23/2020   GLUCOSE 97 10/23/2020    Lab Results  Component Value Date/Time   HGBA1C 5.9 10/23/2020 09:38 AM   HGBA1C 6.0 10/10/2019 09:28 AM   GFR 70.67 10/23/2020 09:38 AM   GFR 59.07 (L) 12/17/2019 03:57 PM    Last diabetic Eye exam:  No results found for: HMDIABEYEEXA  Last diabetic Foot exam:  No results found for: HMDIABFOOTEX   Lab Results  Component Value Date   CHOL 218 (H) 10/23/2020   HDL 72.50 10/23/2020   LDLCALC 121 (H) 10/23/2020   LDLDIRECT 138.2 10/22/2010   TRIG 120.0 10/23/2020   CHOLHDL 3 10/23/2020    Hepatic Function Latest  Ref Rng & Units 10/23/2020 04/16/2020 10/10/2019  Total Protein 6.0 - 8.3 g/dL 6.7 6.6 6.7  Albumin 3.5 - 5.2 g/dL 4.5 - 4.4  AST 0 - 37 U/L _0 ALT 0 - 35 U/L _1 Alk Phosphatase 39 - 117 U/L 91 - 84  Total Bilirubin 0.2 - 1.2 mg/dL 0.5 0.3 0.5  Bilirubin, Direct 0.0 - 0.3 mg/dL - - 0.1    Lab Results  Component Value Date/Time   TSH 2.56 10/23/2020 09:38 AM   TSH 1.79 04/16/2020 09:27 AM    CBC Latest Ref Rng & Units 10/23/2020 10/10/2019 02/21/2018   WBC 4.0 - 10.5 K/uL 3.9(L) 3.3(L) 4.0  Hemoglobin 12.0 - 15.0 g/dL 12.6 12.5 12.1  Hematocrit 36.0 - 46.0 % 38.1 37.8 35.7(L)  Platelets 150.0 - 400.0 K/uL 254.0 251.0 245.0    Lab Results  Component Value Date/Time   VD25OH 32 04/13/2020 03:27 PM   VD25OH 29.26 (L) 12/17/2019 03:57 PM   VD25OH 31 05/27/2015 11:02 AM    Clinical ASCVD: No  The 10-year ASCVD risk score Mikey Bussing DC Jr., et al., 2013) is: 16%   Values used to calculate the score:     Age: 34 years     Sex: Female     Is Non-Hispanic African American: No     Diabetic: No     Tobacco smoker: No     Systolic Blood Pressure: 454 mmHg     Is BP treated: Yes     HDL Cholesterol: 72.5 mg/dL     Total Cholesterol: 218 mg/dL    Depression screen [facility] 2/9 01/21/2021 12/22/2020 10/13/2020  Decreased Interest 0 0 0  Down, Depressed, Hopeless 0 0 0  PHQ - 2 Score 0 0 0  Altered sleeping 0 - -  Tired, decreased energy 0 - -  Change in appetite 0 - -  Feeling bad or failure about yourself  0 - -  Trouble concentrating 0 - -  Moving slowly or fidgety/restless 0 - -  Suicidal thoughts 0 - -  PHQ-9 Score 0 - -  Some recent data might be hidden     Social History   Tobacco Use  Smoking Status Former Smoker   Quit date: 02/11/1970   Years since quitting: 50.9  Smokeless Tobacco Never Used   BP Readings from Last 3 Encounters:  12/28/20 116/70  12/22/20 128/72  10/13/20 (!) 168/82   Pulse Readings from Last 3 Encounters:  12/28/20 61  12/22/20 63  10/13/20 69   Wt Readings from Last 3 Encounters:  12/28/20 133 lb 9.6 oz (60.6 kg)  12/22/20 134 lb (60.8 kg)  10/13/20 127 lb 9.6 oz (57.9 kg)   BMI Readings from Last 3 Encounters:  12/28/20 26.98 kg/m  12/22/20 29.00 kg/m  10/13/20 27.61 kg/m    Assessment/Interventions: Review of patient past medical history, allergies, medications, health status, including review of consultants reports, laboratory and other test data, was performed as part of comprehensive  evaluation and provision of chronic care management services.   SDOH:  (Social Determinants of Health) assessments and interventions performed: Yes   SDOH Screenings   Alcohol Screen: Low Risk    Last Alcohol Screening Score (AUDIT): 2  Depression (PHQ2-9): Low Risk    PHQ-2 Score: 0  Financial Resource Strain: Low Risk    Difficulty of Paying Living Expenses: Not hard at all  Food Insecurity: No Food Insecurity   Worried About Charity fundraiser in the  Last Year: Never true   Ran Out of Food in the Last Year: Never true  Housing: [place] Risk Score: 0  Physical Activity: Sufficiently Active   Days of Exercise per Week: 7 days   Minutes of Exercise per Session: 40 min  Social Connections: Unknown   Frequency of Communication with Friends and Family: More than three times a week   Frequency of Social Gatherings with Friends and Family: Once a week   Attends Religious Services: Patient refused   Marine scientist or Organizations: Patient refused   Attends Music therapist: Patient refused   Marital Status: Married  Stress: No Stress Concern Present   Feeling of Stress : Not at all  Tobacco Use: Medium Risk   Smoking Tobacco Use: Former Smoker   Smokeless Tobacco Use: Never Used  Transportation Needs: No Data processing manager (Medical): No   Lack of Transportation (Non-Medical): No    CCM Care Plan  Allergies  Allergen Reactions   Amlodipine     HA   Augmentin [Amoxicillin-Pot Clavulanate]     diarrhea   Clarithromycin     REACTION: gi upset. Can take Zpac   Codeine Nausea And Vomiting    Can take Prom-cod syr   Esomeprazole Magnesium     REACTION: side pain   Hydrochlorothiazide     REACTION: leg cramps, dehydration   Iohexol      Desc: PT HAS SWELLING TO FACE,LIPS AND THROAT WITH CONTRAST DYE    Kapidex [Dexlansoprazole]     rash   Losartan Potassium     REACTION: HA   Olmesartan Medoxomil      REACTION: weak legs   Ramipril     Per pt: unknown reaction   Risedronate Sodium     Per pt: unknown reaction    Medications Reviewed Today     Reviewed by Tomasa Blase, [facility] (Pharmacist) on 01/21/21 at 1532  Med List Status: <None>   Medication Order Taking? Sig Documenting Provider Last Dose Status Informant  acetaminophen (TYLENOL) 500 MG tablet [id number] Yes Take 500 mg by mouth every 6 (six) hours as needed. [provider] Taking Active Self  alum & mag hydroxide-simeth (MAALOX/MYLANTA) 200-200-20 MG/5ML suspension [id number] Yes Take 15 mLs by mouth every 6 (six) hours as needed for indigestion or heartburn. [provider]  Active   aspirin 81 MG tablet [id number] Yes Take 81 mg by mouth daily. [provider] Taking Active   azelastine (ASTELIN) 0.1 % nasal spray [id number]  Place 2 sprays into both nostrils 2 (two) times daily. Ok Edwards, PA-C  Consider Medication Status and Discontinue (Discontinued by provider)   Bacillus Coagulans-Inulin (ALIGN PREBIOTIC-PROBIOTIC PO) [id number] Yes Take 1 capsule by mouth. Takes for 7 days at a time if needed [provider] Taking Active   Calcium-Vitamin D-Vitamin K (VIACTIV CALCIUM PLUS D PO) [id number] Yes Take 1 tablet by mouth daily. [provider] Taking Active   Cholecalciferol 2000 UNITS TABS [id number] Yes Take 1 tablet by mouth daily. [provider] Taking Active   Coenzyme Q10 (CO Q-10) 100 MG CHEW [id number] Yes Chew 1 capsule by mouth daily. [provider] Taking Active   Cyanocobalamin (B-12) 3000 MCG SUBL [id number] Yes Place 1 tablet under the tongue daily as needed (energy). [provider] Taking Active   diazepam (VALIUM) 5 MG tablet [id number] No Take 5 mg by mouth 2 (two) times daily as  needed. [provider] Unknown Active   dicyclomine (BENTYL) 10 MG capsule [id number] Yes Take 10 mg by mouth 4 (four) times daily as needed (stomach pains).  [provider] Taking Active   diphenoxylate-atropine (LOMOTIL) 2.5-0.025 MG tablet [id number] Yes Take 1-2 tablets by mouth 4 (four) times daily as needed for diarrhea or loose stools. Biagio Borg, MD Taking Active   Evolocumab [facility] SURECLICK) 563 MG/ML Darden Palmer [id number] No Inject 1 pen into the skin every 14 (fourteen) days.  Patient not taking: No sig reported   Fay Records, MD Not Taking Active            Med Note Delice Bison, Karrie Doffing Jan 21, 2021  2:47 PM) Will be picking up and start 01/27/2021   fexofenadine (ALLEGRA) 180 MG tablet [id number] Yes Take 180 mg by mouth daily. [provider] Taking Active   FLUoxetine (PROZAC) 10 MG capsule [id number] Yes TAKE 1 CAPSULE BY MOUTH  DAILY Plotnikov, Evie Lacks, MD Taking Active   fluticasone (FLONASE) 50 MCG/ACT nasal spray [id number] Yes USE 2 SPRAYS NASALLY DAILY Plotnikov, Evie Lacks, MD Taking Active   irbesartan (AVAPRO) 300 MG tablet [id number] Yes TAKE 0.5 TABLETS (150 MG TOTAL) BY MOUTH 2 (TWO) TIMES DAILY. Plotnikov, Evie Lacks, MD Taking Active   labetalol (NORMODYNE) 300 MG tablet [id number] Yes TAKE 1 TABLET BY MOUTH  TWICE DAILY Plotnikov, Evie Lacks, MD Taking Active   LORazepam (ATIVAN) 0.5 MG tablet [id number] No TAKE 1 TABLET BY MOUTH TWICE A DAY  Patient taking differently: 0.5 mg as needed.   Plotnikov, Evie Lacks, MD Unknown Active   Multiple Vitamins-Minerals (MULTIVITAMIN WOMEN 50+) TABS [id number] Yes Take 1 tablet by mouth daily. [provider] Taking Active   pantoprazole (PROTONIX) 40 MG tablet [id number] Yes TAKE 1 TABLET BY MOUTH  DAILY Plotnikov, Evie Lacks, MD Taking Active   rosuvastatin (CRESTOR) 5 MG tablet [id number] Yes Take 5 mg by mouth every day. Fay Records, MD Taking Active   valACYclovir (VALTREX) 1000 MG tablet [id number] Yes Take 2 tablets at the earliest onset of cold sore symptoms.  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   Dysuria 12/22/2020   Low back pain 12/22/2020   Closed fracture of left elbow 08/31/2020   Dyspnea 09/08/2019   COVID-19 virus infection 09/08/2019   Acute sinus infection 11/12/2018   Shingles outbreak 09/04/2018   Hip pain 09/04/2018   LUQ pain 07/19/2018   Bloating 07/19/2018   Hand arthritis 02/21/2018   Viral URI with cough 04/25/2017   Urinary frequency 11/20/2016   Lactose intolerance in adult 09/13/2016   Cough 12/22/2014   Osteoarthritis, 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   Well adult exam 12/04/2013   Back pain, thoracic 08/13/2012   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   Urinary incontinence    Right shoulder pain 12/13/2011   Sciatica of right side 07/06/2011   Hyperglycemia 06/07/2011   Abdominal pain 04/21/2011   WEAKNESS 09/21/2010   CAROTID OCCLUSIVE DISEASE 09/07/2010   WRIST PAIN 06/25/2010   Diarrhea 10/27/2009   DIZZINESS 07/28/2009   TOBACCO USE, QUIT 07/28/2009   Pain in joint 05/26/2009   Lumbago 11/05/2008   Irritable bowel syndrome 06/27/2008   GASTRITIS, HX OF 06/27/2008   Rash and other nonspecific skin eruption 06/02/2008   Dyslipidemia 04/23/2008  Generalized anxiety disorder 04/23/2008   DIVERTICULOSIS, COLON 04/23/2008   GERD 04/02/2008   Allergic rhinitis 12/11/2007   Vitamin D deficiency 08/15/2007   Peripheral vascular disease ([place]) 08/15/2007   Essential hypertension 03/24/2007    Immunization History  Administered Date(s) Administered   Fluad Quad(high Dose 65+) 06/06/2019, 06/11/2020   Influenza Split 07/04/2012   Influenza Whole 07/02/2008, 06/01/2010   Influenza, High Dose Seasonal PF 05/31/2016, 05/26/2017, 05/29/2018   Influenza,inj,Quad PF,6+ Mos 06/02/2015   Influenza,inj,quad, With Preservative 05/29/2017, 05/29/2018, 05/30/2019   Influenza-Unspecified 06/16/2013, 05/29/2014   PFIZER(Purple Top)SARS-COV-2 Vaccination 11/13/2019, 12/04/2019, 07/11/2020    Pneumococcal Conjugate-13 01/14/2015   Pneumococcal Polysaccharide-23 12/04/2013   Td 08/02/2000   Tdap 10/06/2015   Zoster Recombinat (Shingrix) 05/11/2020   Zoster, Live 09/21/2009    Conditions to be addressed/monitored:  Hypertension, Hyperlipidemia, GERD, Depression, Anxiety, Osteoarthritis and Allergic Rhinitis  Care Plan : CCM Pharmacy  Updates made by Tomasa Blase, [place] since 01/21/2021 12:00 AM     Problem: HTN, HLD, OA, Allergic Rhinitis, GERD, Depression / Anxiety   Priority: High  Onset Date: 01/21/2021     Long-Range Goal: Disease Management   Start Date: 01/21/2021  Expected End Date: 07/24/2021  This Visit's Progress: On track  Priority: High  Note:   .Current Barriers:  Unable to independently monitor therapeutic efficacy Unable to achieve control of LDL    Pharmacist Clinical Goal(s):  Patient will verbalize ability to afford treatment regimen achieve adherence to monitoring guidelines and medication adherence to achieve therapeutic efficacy achieve control of LDL as evidenced by next lipid panel results  maintain control of blood pressure as evidenced by home blood pressure readings being within range  through collaboration with PharmD and provider.   Interventions: 1:1 collaboration with Plotnikov, Evie Lacks, MD regarding development and update of comprehensive plan of care as evidenced by provider attestation and co-signature Inter-disciplinary care team collaboration (see longitudinal plan of care) Comprehensive medication review performed; medication list updated in electronic medical record  Hypertension (BP goal <130/80) -Controlled -Current treatment: Labetolol 358m twice daily  Irbesartan 3096m- 1/2 tablet twice daily  -Medications previously tried: Amlodipine, hctz, losartan, olmesartan, ramipril   -Current home readings: n/a - has BP cuff at home but has not been checking  -Current dietary habits: tries to eat a sodium reduced diet   -[facility] habits[facility]  -Denies hypotensive/hypertensive symptoms -Educated on BP goals and benefits of medications for prevention of heart attack, stroke and kidney damage; Daily salt intake goal < 2300 mg; Exercise goal of 150 minutes per week; Importance of home blood pressure monitoring; Symptoms of hypotension and importance of maintaining adequate hydration; -Counseled to monitor BP at home once weekly, document, and provide log at future appointments -Counseled on diet and exercise extensively Recommended to continue current medication  Hyperlipidemia: (LDL goal < 100) -Not ideally controlled  -LDL 121 mg/dL  (10/23/2020) -Current treatment: Crestor 58m56mvery other day  Repatha 140m80mery 2 weeks ASA 81mg4mly   Co-Q-10 - 100mg 458my  -Medications previously tried: atorvastatin , vascepa  -Current dietary patterns: watching intake of fried and fatty foods -Current exercise habits: walking 3 times weekly at the local [employer] -Educated on Cholesterol goals;  Benefits of statin for ASCVD risk reduction; Importance of limiting foods high in cholesterol; Exercise goal of 150 minutes per week; Strategies to manage statin-induced myalgias; -Recommended to continue current medication Counseled on use of statin medication - patient plans to start taking crestor  39m daily - will reach out to discuss further reduction if she develops side effects with current dose  - will start reptha next month - blood work has been ordered   Depression/Anxiety (Goal: mood control / prevention of anxiety attack) -Controlled -Current treatment: Fluoxetine 129mdaily  Diazepam 2m42mwice daily as needed ? - patient unsure if she is taking - patient reports only having to use 2 doses within the last 6 months    -Medications previously tried/failed: lorazepam  -PHQ9: 0 -GAD7: 3 -Educated on Benefits of medication for symptom control -Recommended to continue current  medication  Osteoarthritis / Lumbago - Right Hip (Goal: Pain control) -Not ideally controlled -Current treatment  APAP 500m29mery 6 hours as needed  -Medications previously tried: meloxicam, IBU, tramadol, oxycodone   -Recommended for patient to trial voltaren gel on hip, agreeable, also plans to make appointment with sports medicine, has had injection in the past with success    GERD / diarrhea (Goal: acid control / prevention of flares ) -Controlled -Current treatment  Pantoprazole 40mg61mly  Dicyclomine 10mg 74m to 4 times daily prn  Lomotil 2.5-0.022mg - 1-2 tablets 4 times daily as needed  Mylanta - 12mL q62mrn - only uses if she eats spicy foods  -Medications previously tried: omeprazole, hyoscyamine, ranitidine  -Counseled on diet and exercise extensively Recommended to continue current medication   Allergic Rhinitis (Goal: Allergy control / treatment of flare) -Controlled -Current treatment  Fexofenadine 180mg da64m Fluticasone 50 mcg/act - 2 sprays into each nostril daily  -Medications previously tried: claritin, azelastine  -Recommended to continue current medication   Health Maintenance -Current therapy:  Vit D - 2000 units daily  Align Prebiotic-Probiotic - 1 gummy daily  Vit B12 3000mcg - 14mblet daily  Calcium chewable - 1 chewable daily  Multivitamin - 1 tablet daily  Valacyclovir 1000mg - 2 82mets at the earliest onset of cold sore - than additional 2 tablets after  -Educated on Herbal supplement research is limited and benefits usually cannot be proven Cost vs benefit of each product must be carefully weighed by individual consumer Supplements may interfere with prescription drugs -Patient is satisfied with current therapy and denies issues -Recommended to continue current medication  Patient Goals/Self-Care Activities Patient will:  - take medications as prescribed check blood pressure once weekly, document, and provide at future  appointments  Follow Up Plan: Telephone follow up appointment with care management team member scheduled for: The patient has been provided with contact information for the care management team and has been advised to call with any health related questions or concerns.        Medication Assistance: None required.  Patient affirms current coverage meets needs.  Patient's preferred pharmacy is:  [id number] [id number], [place] - [id number] [place] [address] [place] [phone] [phone] box? No Pt endorses 100% compliance  Care Plan and Follow Up Patient Decision:  Patient agrees to Care Plan and Follow-up.  Plan: Telephone follow up appointment with care management team member scheduled for:  3 months  and The patient has been provided with contact information for  the care management team and has been advised to call with any health related questions or concerns.   Will plan to discuss BP control as well as lipid panel results after starting Repatha in 3 months  Patient will also write down list of all medications she is taking to ensure most accurate list of medications is able to be updated for her profile   Tomasa Blase, PharmD Clinical Pharmacist, [place] screening examination/treatment/procedure(s) were performed by non-physician practitioner and as supervising physician I was immediately available for consultation/collaboration.  I agree with above. Lew Dawes, MD

## 2021-01-27 ENCOUNTER — Telehealth: Payer: Self-pay

## 2021-01-27 NOTE — Chronic Care Management (AMB) (Signed)
Patient called in today, reports that she is planning to start repatha today, is scheduled of cortisone injection in hip in 1 week, patient will proceed with plan as scheduled   Tomasa Blase, PharmD Clinical Pharmacist, [place]

## 2021-02-03 DIAGNOSIS — M25551 Pain in right hip: Secondary | ICD-10-CM | POA: Diagnosis not present

## 2021-02-12 ENCOUNTER — Other Ambulatory Visit: Payer: Self-pay | Admitting: *Deleted

## 2021-02-12 NOTE — Telephone Encounter (Signed)
Rec'd fax for pt rosuvastatin.. Per chart med is rx by pt cardiologist. Faxing to Dr. Harrington Challenger for approval../lmb

## 2021-02-15 ENCOUNTER — Other Ambulatory Visit: Payer: Self-pay

## 2021-02-15 MED ORDER — REPATHA SURECLICK 140 MG/ML ~~LOC~~ SOAJ
1.0000 | SUBCUTANEOUS | 11 refills | Status: DC
Start: 2021-02-15 — End: 2021-08-06

## 2021-02-15 NOTE — Telephone Encounter (Signed)
Fax refill request received and sent for repatha [employer]

## 2021-02-26 ENCOUNTER — Other Ambulatory Visit: Payer: Medicare Other

## 2021-03-04 ENCOUNTER — Other Ambulatory Visit: Payer: Self-pay

## 2021-03-04 ENCOUNTER — Ambulatory Visit (INDEPENDENT_AMBULATORY_CARE_PROVIDER_SITE_OTHER): Payer: Medicare Other | Admitting: Internal Medicine

## 2021-03-04 ENCOUNTER — Encounter: Payer: Self-pay | Admitting: Internal Medicine

## 2021-03-04 DIAGNOSIS — M19049 Primary osteoarthritis, unspecified hand: Secondary | ICD-10-CM

## 2021-03-04 DIAGNOSIS — T466X5A Adverse effect of antihyperlipidemic and antiarteriosclerotic drugs, initial encounter: Secondary | ICD-10-CM

## 2021-03-04 DIAGNOSIS — G720 Drug-induced myopathy: Secondary | ICD-10-CM | POA: Diagnosis not present

## 2021-03-04 DIAGNOSIS — R197 Diarrhea, unspecified: Secondary | ICD-10-CM

## 2021-03-04 MED ORDER — DICYCLOMINE HCL 10 MG PO CAPS: 10.0000 mg | ORAL_CAPSULE | Freq: Four times a day (QID) | ORAL | 1 refills | Status: DC | PRN

## 2021-03-04 MED ORDER — PANCRELIPASE (LIP-PROT-AMYL) 36000-114000 UNITS PO CPEP: 36000.0000 [IU] | ORAL_CAPSULE | Freq: Three times a day (TID) | ORAL | 3 refills | Status: DC

## 2021-03-04 NOTE — Assessment & Plan Note (Signed)
The patient seems to be tolerating Crestor without myalgias.

## 2021-03-04 NOTE — Assessment & Plan Note (Signed)
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  Her father had similar problems.  Recent rheumatoid factor was negative.  Hand x-ray was consistent with osteoarthritis.  Will obtain rheumatology consultation with Dr. Amil Amen.  She can use Voltaren gel, paraffin baths, gentle heat.

## 2021-03-04 NOTE — Progress Notes (Signed)
Subjective:  Patient ID: [name], female    DOB: [birth year]  Age: 75 y.o. MRN: [id number]  CC: GI Problem (Pt states she's been having upset stomach issues since she had covid. Want refill on the Lomotil & Bentyl)   HPI Gicela Schwarting presents for a gastrointestinal problem that started after her COVID 2 years ago.  She is complaining of diarrhea and abdominal cramps that happen every time she would eat out or have a takeout.  Spicy and greasy foods are more likely to make it happen.  She was using her supply of dicyclomine and Lomotil as needed.  C/o hand arthritis w/pain.  It has been getting worse.  There is pain and instability in the distal phalanxes.  She has been dropping things.  Her symptoms are worse in cold weather.  She had negative rheumatoid factor test and fairly unremarkable hand x-rays.  The patient saw Dr. Amil Amen in the past.  Outpatient Medications Prior to Visit  Medication Sig Dispense Refill   acetaminophen (TYLENOL) 500 MG tablet Take 500 mg by mouth every 6 (six) hours as needed.     alum & mag hydroxide-simeth (MAALOX/MYLANTA) 200-200-20 MG/5ML suspension Take 15 mLs by mouth every 6 (six) hours as needed for indigestion or heartburn.     aspirin 81 MG tablet Take 81 mg by mouth daily.     azelastine (ASTELIN) 0.1 % nasal spray Place 2 sprays into both nostrils 2 (two) times daily. 30 mL 0   Bacillus Coagulans-Inulin (ALIGN PREBIOTIC-PROBIOTIC PO) Take 1 capsule by mouth. Takes for 7 days at a time if needed     Calcium-Vitamin D-Vitamin K (VIACTIV CALCIUM PLUS D PO) Take 1 tablet by mouth daily.     Cholecalciferol 2000 UNITS TABS Take 1 tablet by mouth daily.     Coenzyme Q10 (CO Q-10) 100 MG CHEW Chew 1 capsule by mouth daily.     Cyanocobalamin (B-12) 3000 MCG SUBL Place 1 tablet under the tongue daily as needed (energy).     diazepam (VALIUM) 5 MG tablet Take 5 mg by mouth 2 (two) times daily as needed.     dicyclomine (BENTYL) 10 MG  capsule Take 10 mg by mouth 4 (four) times daily as needed (stomach pains).     diphenoxylate-atropine (LOMOTIL) 2.5-0.025 MG tablet Take 1-2 tablets by mouth 4 (four) times daily as needed for diarrhea or loose stools. 60 tablet 0   Evolocumab (REPATHA SURECLICK) 174 MG/ML SOAJ Inject 1 pen into the skin every 14 (fourteen) days. 2 mL 11   fexofenadine (ALLEGRA) 180 MG tablet Take 180 mg by mouth daily.     FLUoxetine (PROZAC) 10 MG capsule TAKE 1 CAPSULE BY MOUTH  DAILY 90 capsule 3   fluticasone (FLONASE) 50 MCG/ACT nasal spray USE 2 SPRAYS NASALLY DAILY 48 g 3   irbesartan (AVAPRO) 300 MG tablet TAKE 0.5 TABLETS (150 MG TOTAL) BY MOUTH 2 (TWO) TIMES DAILY. 90 tablet 2   labetalol (NORMODYNE) 300 MG tablet TAKE 1 TABLET BY MOUTH  TWICE DAILY 180 tablet 3   LORazepam (ATIVAN) 0.5 MG tablet TAKE 1 TABLET BY MOUTH TWICE A DAY (Patient taking differently: 0.5 mg as needed.) 60 tablet 3   Multiple Vitamins-Minerals (MULTIVITAMIN WOMEN 50+) TABS Take 1 tablet by mouth daily.     pantoprazole (PROTONIX) 40 MG tablet TAKE 1 TABLET BY MOUTH  DAILY 90 tablet 3   rosuvastatin (CRESTOR) 5 MG tablet Take 5 mg by mouth every day. 30 tablet 3  valACYclovir (VALTREX) 1000 MG tablet Take 2 tablets at the earliest onset of cold sore symptoms.  Repeat with 2 tablets 12 hours later 12 tablet 1   No facility-administered medications prior to visit.    ROS: Review of Systems  Constitutional:  Negative for activity change, appetite change, chills, fatigue and unexpected weight change.  HENT:  Negative for congestion, mouth sores and sinus pressure.   Eyes:  Negative for visual disturbance.  Respiratory:  Negative for cough and chest tightness.   Gastrointestinal:  Positive for diarrhea. Negative for abdominal pain, blood in stool, constipation, nausea and vomiting.  Genitourinary:  Negative for difficulty urinating, frequency and vaginal pain.  Musculoskeletal:  Positive for arthralgias. Negative for back pain  and gait problem.  Skin:  Negative for pallor and rash.  Neurological:  Negative for dizziness, tremors, weakness, numbness and headaches.  Psychiatric/Behavioral:  Negative for confusion and sleep disturbance.    Objective:  BP 138/70 (BP Location: Left Arm)   Pulse 60   Temp 98.3 F (36.8 C) (Oral)   Ht 4\' 11"  (1.499 m)   Wt 128 lb 12.8 oz (58.4 kg)   SpO2 98%   BMI 26.01 kg/m   BP Readings from Last 3 Encounters:  03/04/21 138/70  12/28/20 116/70  12/22/20 128/72    Wt Readings from Last 3 Encounters:  03/04/21 128 lb 12.8 oz (58.4 kg)  12/28/20 133 lb 9.6 oz (60.6 kg)  12/22/20 134 lb (60.8 kg)    Physical Exam Constitutional:      General: She is not in acute distress.    Appearance: She is well-developed.  HENT:     Head: Normocephalic.     Right Ear: External ear normal.     Left Ear: External ear normal.     Nose: Nose normal.  Eyes:     General:        Right eye: No discharge.        Left eye: No discharge.     Conjunctiva/sclera: Conjunctivae normal.     Pupils: Pupils are equal, round, and reactive to light.  Neck:     Thyroid: No thyromegaly.     Vascular: No JVD.     Trachea: No tracheal deviation.  Cardiovascular:     Rate and Rhythm: Normal rate and regular rhythm.     Heart sounds: Normal heart sounds.  Pulmonary:     Effort: No respiratory distress.     Breath sounds: No stridor. No wheezing.  Abdominal:     General: Bowel sounds are normal. There is no distension.     Palpations: Abdomen is soft. There is no mass.     Tenderness: There is no abdominal tenderness. There is no guarding or rebound.  Musculoskeletal:        General: No tenderness.     Cervical back: Normal range of motion and neck supple. No rigidity.  Lymphadenopathy:     Cervical: No cervical adenopathy.  Skin:    Findings: No erythema or rash.  Neurological:     Cranial Nerves: No cranial nerve deficit.     Motor: No abnormal muscle tone.     Coordination:  Coordination normal.     Deep Tendon Reflexes: Reflexes normal.  Psychiatric:        Behavior: Behavior normal.        Thought Content: Thought content normal.        Judgment: Judgment normal.    Lab Results  Component Value Date   WBC  3.9 (L) 10/23/2020   HGB 12.6 10/23/2020   HCT 38.1 10/23/2020   PLT 254.0 10/23/2020   GLUCOSE 97 10/23/2020   CHOL 218 (H) 10/23/2020   TRIG 120.0 10/23/2020   HDL 72.50 10/23/2020   LDLDIRECT 138.2 10/22/2010   LDLCALC 121 (H) 10/23/2020   ALT 17 10/23/2020   AST 16 10/23/2020   NA 138 10/23/2020   K 4.2 10/23/2020   CL 103 10/23/2020   CREATININE 0.82 10/23/2020   BUN 14 10/23/2020   CO2 31 10/23/2020   TSH 2.56 10/23/2020   HGBA1C 5.9 10/23/2020    VAS US CAROTID  Result Date: 01/01/2021 Carotid Arterial Duplex Study Patient Name:  AVI ARCHULETA  Date of Exam:   01/01/2021 Medical Rec #: [id number]               Accession #:    [id number] Date of Birth: 05-02-1946               Patient Gender: F Patient Age:   54Y Exam Location:  [facility] Procedure:      VAS US CAROTID Referring Phys: [id number] Carmin Muskrat ROSS --------------------------------------------------------------------------------  Performing Technologist: Sharlett Iles RVT  Examination Guidelines: A complete evaluation includes B-mode imaging, spectral Doppler, color Doppler, and power Doppler as needed of all accessible portions of each vessel. Bilateral testing is considered an integral part of a complete examination. Limited examinations for reoccurring indications may be performed as noted.  Right Carotid Findings: +----------+--------+--------+--------+------------------+---------+           PSV cm/sEDV cm/sStenosisPlaque DescriptionComments  +----------+--------+--------+--------+------------------+---------+ CCA Prox  85      17                                          +----------+--------+--------+--------+------------------+---------+ CCA Distal59      13                                           +----------+--------+--------+--------+------------------+---------+ ICA Prox  74      16      Normal                    turbulent +----------+--------+--------+--------+------------------+---------+ ICA Mid   102     30                                          +----------+--------+--------+--------+------------------+---------+ ICA Distal100     31                                          +----------+--------+--------+--------+------------------+---------+ ECA       81      11              heterogenous                +----------+--------+--------+--------+------------------+---------+ +----------+--------+-------+----------------+-------------------+           PSV cm/sEDV cmsDescribe        Arm Pressure (mmHG) +----------+--------+-------+----------------+-------------------+ [id number]            Multiphasic, [id number]                 +----------+--------+-------+----------------+-------------------+ +---------+--------+--+--------+--+---------+  VertebralPSV cm/s70EDV cm/s15Antegrade +---------+--------+--+--------+--+---------+  Left Carotid Findings: +----------+--------+--------+--------+------------------+---------------------+           PSV cm/sEDV cm/sStenosisPlaque DescriptionComments              +----------+--------+--------+--------+------------------+---------------------+ CCA Prox  97      18                                                      +----------+--------+--------+--------+------------------+---------------------+ CCA Distal81      50      <50%    heterogenous                            +----------+--------+--------+--------+------------------+---------------------+ ICA Prox  54      19              heterogenous                            +----------+--------+--------+--------+------------------+---------------------+ ICA Mid   153     46      40-59%                     elevated velocities                                                       with no obvious                                                           plaque seen suggest                                                       40-59% stenosis;                                                          elevated velocities                                                       in the proximal/mid                                                       segment due to kink  in the vessel, and                                                        then takes a steep                                                        posterior dive. See                                                       images 44-47.         +----------+--------+--------+--------+------------------+---------------------+ ICA Distal115     34                                                      +----------+--------+--------+--------+------------------+---------------------+ ECA       87      12              heterogenous                            +----------+--------+--------+--------+------------------+---------------------+ +----------+--------+--------+----------------+-------------------+           PSV cm/sEDV cm/sDescribe        Arm Pressure (mmHG) +----------+--------+--------+----------------+-------------------+ [id number]             Multiphasic, [id number]                 +----------+--------+--------+----------------+-------------------+ +---------+--------+--+--------+--+---------+ VertebralPSV cm/s56EDV cm/s16Antegrade +---------+--------+--+--------+--+---------+   Summary: Right Carotid: There is no evidence of stenosis in the right ICA. The                extracranial vessels were near-normal with only minimal wall                thickening or plaque. Stable RICA velocities. 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               Non-hemodynamically significant plaque <50% noted in the CCA.               Stable elevated velocities with no obvious plaque seen in the ICA               suggesting 40-59% stenosis; elevated velocities in the               proximal/mid segment due to kink in the vessel, and a steep               posterior dive. See images 44-47. Vertebrals:  Bilateral vertebral arteries demonstrate antegrade flow. Subclavians: Normal flow hemodynamics were seen in bilateral subclavian              arteries. *See table(s) above for measurements and observations.  Electronically signed by Larae Grooms MD on 01/01/2021 at 5:51:28  PM.    Final     Assessment & Plan:   There are no diagnoses linked to this encounter.   No orders of the defined types were placed in this encounter.    Follow-up: No follow-ups on file.  Walker Kehr, MD

## 2021-03-04 NOTE — Assessment & Plan Note (Addendum)
Worse after COVID in 2020 She is complaining of diarrhea and abdominal cramps that happen every time she would eat out or have a takeout.  Spicy and greasy foods are more likely to make it happen.  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  Otherwise she can try over-the-counter digestive enzymes as needed. GI consultation with Dr. Silverio Decamp

## 2021-03-15 ENCOUNTER — Telehealth: Payer: Self-pay

## 2021-03-15 ENCOUNTER — Other Ambulatory Visit: Payer: Self-pay

## 2021-03-15 ENCOUNTER — Other Ambulatory Visit: Payer: Medicare Other | Admitting: *Deleted

## 2021-03-15 DIAGNOSIS — I739 Peripheral vascular disease, unspecified: Secondary | ICD-10-CM | POA: Diagnosis not present

## 2021-03-15 DIAGNOSIS — E559 Vitamin D deficiency, unspecified: Secondary | ICD-10-CM | POA: Diagnosis not present

## 2021-03-15 DIAGNOSIS — Z79899 Other long term (current) drug therapy: Secondary | ICD-10-CM | POA: Diagnosis not present

## 2021-03-15 DIAGNOSIS — I10 Essential (primary) hypertension: Secondary | ICD-10-CM | POA: Diagnosis not present

## 2021-03-15 DIAGNOSIS — E785 Hyperlipidemia, unspecified: Secondary | ICD-10-CM | POA: Diagnosis not present

## 2021-03-15 LAB — HEPATIC FUNCTION PANEL
ALT: 20 IU/L (ref 0–32)
AST: 18 IU/L (ref 0–40)
Albumin: 4.6 g/dL (ref 3.7–4.7)
Alkaline Phosphatase: 98 IU/L (ref 44–121)
Bilirubin Total: 0.3 mg/dL (ref 0.0–1.2)
Bilirubin, Direct: 0.11 mg/dL (ref 0.00–0.40)
Total Protein: 6.5 g/dL (ref 6.0–8.5)

## 2021-03-15 LAB — LIPID PANEL
Chol/HDL Ratio: 1.9 ratio (ref 0.0–4.4)
Cholesterol, Total: 138 mg/dL (ref 100–199)
HDL: 74 mg/dL (ref >39)
LDL Chol Calc (NIH): 47 mg/dL (ref 0–99)
Triglycerides: 91 mg/dL (ref 0–149)
VLDL Cholesterol Cal: 17 mg/dL (ref 5–40)

## 2021-03-15 NOTE — Telephone Encounter (Signed)
I would not.  They do not seem to work for omicron prevention.  Thanks

## 2021-03-15 NOTE — Telephone Encounter (Signed)
Pt was wanting providers advice on getting her 2nd booster. Message sent to MA.

## 2021-03-16 NOTE — Telephone Encounter (Signed)
Called pt there was no answer LMOM w/MD response../lmb 

## 2021-03-17 ENCOUNTER — Other Ambulatory Visit: Payer: Self-pay | Admitting: Internal Medicine

## 2021-04-13 ENCOUNTER — Other Ambulatory Visit: Payer: Self-pay | Admitting: Internal Medicine

## 2021-04-15 ENCOUNTER — Other Ambulatory Visit: Payer: Self-pay | Admitting: Internal Medicine

## 2021-04-15 ENCOUNTER — Telehealth: Payer: Medicare Other

## 2021-04-16 ENCOUNTER — Telehealth: Payer: Self-pay | Admitting: Internal Medicine

## 2021-04-16 NOTE — Telephone Encounter (Signed)
Pt c/o medication issue:  1. Name of Medication:  Evolocumab (REPATHA SURECLICK) [id number] MG/ML SOAJ  2. How are you currently taking this medication (dosage and times per day)?   3. Are you having a reaction (difficulty breathing--STAT)?   4. What is your medication issue?   Patient states she forgot to take her injection on 04/12/21 and traveled to [place], leaving her medication at home. She states she will return on 04/18/21 and she would like to know if she should take her medication then. Please advise.

## 2021-04-19 NOTE — Telephone Encounter (Signed)
Ok to take Repatha on 8/21 when she returns. She may resume her normal schedule after. Called and left detailed message on machine per DPR. Will also send mychart message.

## 2021-04-28 DIAGNOSIS — M25511 Pain in right shoulder: Secondary | ICD-10-CM | POA: Diagnosis not present

## 2021-05-09 ENCOUNTER — Other Ambulatory Visit: Payer: Self-pay

## 2021-05-09 ENCOUNTER — Ambulatory Visit
Admission: EM | Admit: 2021-05-09 | Discharge: 2021-05-09 | Disposition: A | Discharge status: Home / Self Care | Payer: Medicare Other | Attending: Family Medicine | Admitting: Family Medicine

## 2021-05-09 DIAGNOSIS — Z20822 Contact with and (suspected) exposure to covid-19: Secondary | ICD-10-CM | POA: Diagnosis not present

## 2021-05-09 DIAGNOSIS — U071 COVID-19: Secondary | ICD-10-CM | POA: Diagnosis not present

## 2021-05-09 NOTE — ED Triage Notes (Signed)
Pt c/o sore throat, nasal congestion.   Denies cough, head ache, body aches and chills, nausea or vomiting, diarrhea or constipation.   Onset last Friday. States husband was sick last week but did not get a covid test. Pt started getting symptoms and took a rapid test at home that was covid(+). States she would like pcr test today.   Tried to reconcile allergy list but pt seemed woefully unaware of what she is/is not allergic to, what reactions occur, etc.   Pt also unclear what medications she takes regularly or what they are for.

## 2021-05-09 NOTE — ED Provider Notes (Signed)
[name]    CSN: [id number] Arrival date & time: 05/09/21  1344      History   Chief Complaint Chief Complaint  Patient presents with   covid test    HPI Sharon Paul is a 75 y.o. female.   Patient presenting today with 4-day history of nasal congestion, mild cough, scratchy throat.  Denies fever, chills, body aches, chest pain, shortness of breath.  Husband was sick with similar symptoms last week but did not get tested for anything.  No known history of chronic pulmonary disease.  Taking Coricidin HBP with good temporary relief.  States she took a home test that was positive yesterday but noticed that the test was expired so wanting a PCR test today.   Past Medical History:  Diagnosis Date   Celiac sprue 2007   CVD (cardiovascular disease)    Foot fracture, left 2011   GERD (gastroesophageal reflux disease)    Hyperlipidemia    Osteopenia 04/2019   T score -1.9 FRAX 10% / 1.9%.  Stable from prior DEXA   PVD (peripheral vascular disease) ([place])    mild carotid ? 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   Well adult exam 12/04/2013   Back pain, thoracic 08/13/2012   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   Urinary incontinence    Right shoulder pain 12/13/2011   Sciatica of right side 07/06/2011   Hyperglycemia 06/07/2011   Abdominal pain 04/21/2011   WEAKNESS 09/21/2010    CAROTID OCCLUSIVE DISEASE 09/07/2010   WRIST PAIN 06/25/2010   Diarrhea 10/27/2009   DIZZINESS 07/28/2009   TOBACCO USE, QUIT 07/28/2009   Pain in joint 05/26/2009   Lumbago 11/05/2008   Irritable bowel syndrome 06/27/2008   GASTRITIS, HX OF 06/27/2008   Rash and other nonspecific skin eruption 06/02/2008   Dyslipidemia 04/23/2008   Generalized anxiety disorder 04/23/2008   DIVERTICULOSIS, COLON 04/23/2008   GERD 04/02/2008   Allergic rhinitis 12/11/2007   Vitamin D deficiency 08/15/2007   Peripheral vascular disease ([place]) 08/15/2007   Essential hypertension 03/24/2007    Past Surgical History:  Procedure Laterality Date   APPENDECTOMY     BREAST SURGERY     right breast biopsy   ESOPHAGOGASTRODUODENOSCOPY     ROTATOR CUFF REPAIR     RIGHT   ROTATOR CUFF REPAIR     Left   stress cardiolite  04-18-00   VAGINAL HYSTERECTOMY  1993   leiomyomata    OB History     Gravida  3   Para  2   Term  2   Preterm      AB  1   Living  2      SAB      IAB      Ectopic      Multiple      Live Births  Home Medications    Prior to Admission medications   Medication Sig Start Date End Date Taking? Authorizing Provider  acetaminophen (TYLENOL) 500 MG tablet Take 500 mg by mouth every 6 (six) hours as needed.    [provider]  alum & mag hydroxide-simeth (MAALOX/MYLANTA) 200-200-20 MG/5ML suspension Take 15 mLs by mouth every 6 (six) hours as needed for indigestion or heartburn.    [provider]  aspirin 81 MG tablet Take 81 mg by mouth daily.    [provider]  azelastine (ASTELIN) 0.1 % nasal spray Place 2 sprays into both nostrils 2 (two) times daily. 08/17/19   Tasia Catchings, Amy V, PA-C  Bacillus Coagulans-Inulin (ALIGN PREBIOTIC-PROBIOTIC PO) Take 1 capsule by mouth. Takes for 7 days at a time if needed    [provider]  Calcium-Vitamin D-Vitamin K (VIACTIV CALCIUM PLUS D PO) Take 1 tablet by mouth daily.     [provider]  Cholecalciferol 2000 UNITS TABS Take 1 tablet by mouth daily.    [provider]  Coenzyme Q10 (CO Q-10) 100 MG CHEW Chew 1 capsule by mouth daily.    [provider]  Cyanocobalamin (B-12) 3000 MCG SUBL Place 1 tablet under the tongue daily as needed (energy).    [provider]  diazepam (VALIUM) 5 MG tablet Take 5 mg by mouth 2 (two) times daily as needed. 07/03/19   [provider]  dicyclomine (BENTYL) 10 MG capsule Take 1 capsule (10 mg total) by mouth 4 (four) times daily as needed (stomach pains). 03/04/21   Plotnikov, Evie Lacks, MD  diphenoxylate-atropine (LOMOTIL) 2.5-0.025 MG tablet TAKE 1-2 TABLETS BY MOUTH 4 (FOUR) TIMES DAILY AS NEEDED FOR DIARRHEA OR LOOSE STOOLS. 03/18/21   Plotnikov, Evie Lacks, MD  Evolocumab (REPATHA SURECLICK) [id number] MG/ML SOAJ Inject 1 pen into the skin every 14 (fourteen) days. 02/15/21   Fay Records, MD  fexofenadine (ALLEGRA) 180 MG tablet Take 180 mg by mouth daily.    [provider]  FLUoxetine (PROZAC) 10 MG capsule TAKE 1 CAPSULE BY MOUTH  DAILY 11/11/20   Plotnikov, Evie Lacks, MD  fluticasone (FLONASE) 50 MCG/ACT nasal spray USE 2 SPRAYS NASALLY DAILY 03/18/20   Plotnikov, Evie Lacks, MD  irbesartan (AVAPRO) 300 MG tablet TAKE 0.5 TABLETS (150 MG TOTAL) BY MOUTH 2 (TWO) TIMES DAILY. 01/14/21   Plotnikov, Evie Lacks, MD  labetalol (NORMODYNE) 300 MG tablet TAKE 1 TABLET BY MOUTH  TWICE DAILY 11/11/20   Plotnikov, Evie Lacks, MD  lipase/protease/amylase (CREON) 36000 UNITS CPEP capsule Take 1 capsule (36,000 Units total) by mouth 3 (three) times daily before meals. Take with the first bite of food 03/04/21   Plotnikov, Evie Lacks, MD  LORazepam (ATIVAN) 0.5 MG tablet TAKE 1 TABLET BY MOUTH TWICE A DAY Patient taking differently: 0.5 mg as needed. 06/29/16   Plotnikov, Evie Lacks, MD  Multiple Vitamins-Minerals (MULTIVITAMIN WOMEN 50+) TABS Take 1 tablet by mouth daily.    [provider]   pantoprazole (PROTONIX) 40 MG tablet Take 1 tablet (40 mg total) by mouth daily. Annual appt due in Feb 2023 must see provider for future refills 04/13/21   Plotnikov, Evie Lacks, MD  rosuvastatin (CRESTOR) 10 MG tablet TAKE 1 TABLET BY MOUTH  EVERY OTHER DAY 04/15/21   Plotnikov, Evie Lacks, MD  rosuvastatin (CRESTOR) 5 MG tablet Take 5 mg by mouth every day. 01/13/21   Fay Records, MD  valACYclovir (VALTREX) 1000 MG tablet Take 2 tablets at the earliest  onset of cold sore symptoms.  Repeat with 2 tablets 12 hours later 04/10/19   Fontaine, Belinda Block, MD    Family History Family History  Problem Relation Age of Onset   Glaucoma Mother    Heart disease Mother    Hypertension Mother    Heart disease Father    Hypertension Father    Stroke Father    Hypertension Paternal Grandmother    Lung cancer Paternal Grandmother    Hypertension Sister    Hypertension Brother    Glaucoma Brother    Colon cancer Neg Hx     Social History Social History   Tobacco Use   Smoking status: Former    Types: Cigarettes    Quit date: 02/11/1970    Years since quitting: 51.2   Smokeless tobacco: Never  Vaping Use   Vaping Use: Never used  Substance Use Topics   Alcohol use: Yes    Alcohol/week: 0.0 standard drinks    Comment: very rare   Drug use: No     Allergies   Amlodipine, Augmentin [amoxicillin-pot clavulanate], Clarithromycin, Codeine, Esomeprazole magnesium, Hydrochlorothiazide, Iohexol, Kapidex [dexlansoprazole], Losartan potassium, Olmesartan medoxomil, Ramipril, and Risedronate sodium   Review of Systems Review of Systems Per HPI  Physical Exam Triage Vital Signs ED Triage Vitals [05/09/21 1438]  Enc Vitals Group     BP (!) 171/71     Pulse Rate 64     Resp 18     Temp 98.1 F (36.7 C)     Temp Source Oral     SpO2 97 %     Weight      Height      Head Circumference      Peak Flow      Pain Score 0     Pain Loc      Pain Edu?      Excl. in [place]?    No data  found.  Updated Vital Signs BP (!) 171/71 (BP Location: Left Arm)   Pulse 64   Temp 98.1 F (36.7 C) (Oral)   Resp 18   SpO2 97%   Visual Acuity Right Eye Distance:   Left Eye Distance:   Bilateral Distance:    Right Eye Near:   Left Eye Near:    Bilateral Near:     Physical Exam Vitals and nursing note reviewed.  Constitutional:      Appearance: Normal appearance. She is not ill-appearing.  HENT:     Head: Atraumatic.     Right Ear: Tympanic membrane normal.     Left Ear: Tympanic membrane normal.     Nose: Rhinorrhea present.     Mouth/Throat:     Mouth: Mucous membranes are dry.     Pharynx: Posterior oropharyngeal erythema present. No oropharyngeal exudate.  Eyes:     Extraocular Movements: Extraocular movements intact.     Conjunctiva/sclera: Conjunctivae normal.  Cardiovascular:     Rate and Rhythm: Normal rate and regular rhythm.     Heart sounds: Normal heart sounds.  Pulmonary:     Effort: Pulmonary effort is normal. No respiratory distress.     Breath sounds: Normal breath sounds. No wheezing or rales.  Musculoskeletal:        General: Normal range of motion.     Cervical back: Normal range of motion and neck supple.  Skin:    General: Skin is warm and dry.  Neurological:     Mental Status: She is alert and oriented to person, place,  and time.     Motor: No weakness.     Gait: Gait normal.  Psychiatric:        Mood and Affect: Mood normal.        Thought Content: Thought content normal.        Judgment: Judgment normal.   UC Treatments / Results  Labs (all labs ordered are listed, but only abnormal results are displayed) Labs Reviewed  NOVEL CORONAVIRUS, NAA   EKG  Radiology No results found.  Procedures Procedures (including critical care time)  Medications Ordered in UC Medications - No data to display  Initial Impression / Assessment and Plan / UC Course  I have reviewed the triage vital signs and the nursing notes.  Pertinent  labs & imaging results that were available during my care of the patient were reviewed by me and considered in my medical decision making (see chart for details).     Overall very well-appearing, suspect viral illness.  COVID test at home was positive, will check a PCR test for confirmation.  Continue Coricidin HBP, supportive home care.  Return for acutely worsening symptoms.  Final Clinical Impressions(s) / UC Diagnoses   Final diagnoses:  COVID-19  Exposure to COVID-19 virus   Discharge Instructions   None    ED Prescriptions   None    PDMP not reviewed this encounter.   Volney American, [place] 05/09/21 1518

## 2021-05-10 ENCOUNTER — Telehealth: Payer: Self-pay | Admitting: Internal Medicine

## 2021-05-10 LAB — SARS-COV-2, NAA 2 DAY TAT

## 2021-05-10 LAB — NOVEL CORONAVIRUS, NAA: SARS-CoV-2, NAA: DETECTED — AB

## 2021-05-11 ENCOUNTER — Encounter: Payer: Self-pay | Admitting: Emergency Medicine

## 2021-05-11 ENCOUNTER — Telehealth (INDEPENDENT_AMBULATORY_CARE_PROVIDER_SITE_OTHER): Payer: Medicare Other | Admitting: Emergency Medicine

## 2021-05-11 DIAGNOSIS — R059 Cough, unspecified: Secondary | ICD-10-CM

## 2021-05-11 DIAGNOSIS — R0981 Nasal congestion: Secondary | ICD-10-CM | POA: Diagnosis not present

## 2021-05-11 DIAGNOSIS — J0100 Acute maxillary sinusitis, unspecified: Secondary | ICD-10-CM | POA: Diagnosis not present

## 2021-05-11 DIAGNOSIS — U071 COVID-19: Secondary | ICD-10-CM

## 2021-05-11 MED ORDER — HYDROCODONE BIT-HOMATROP MBR 5-1.5 MG/5ML PO SOLN: 5.0000 mL | Freq: Every evening | ORAL | 0 refills | Status: DC | PRN

## 2021-05-11 MED ORDER — AZITHROMYCIN 250 MG PO TABS: ORAL_TABLET | ORAL | 0 refills | Status: DC

## 2021-05-11 NOTE — Progress Notes (Signed)
Telemedicine Encounter- SOAP NOTE Established Patient MyChart video encounter Patient: Home  Provider: Office   Patient present only  This telephone encounter was conducted with the patient's (or proxy's) verbal consent via audio telecommunications: yes/no: Yes Patient was instructed to have this encounter in a suitably private space; and to only have persons present to whom they give permission to participate. In addition, patient identity was confirmed by use of name plus two identifiers (DOB and address).  I discussed the limitations, risks, security and privacy concerns of performing an evaluation and management service by telephone and the availability of in person appointments. I also discussed with the patient that there may be a patient responsible charge related to this service. The patient expressed understanding and agreed to proceed.  I spent a total of TIME; 0 MIN TO 60 MIN: 20 minutes talking with the patient or their proxy.  Chief complaint: COVID infection  Subjective   Sharon Paul is a 75 y.o. female established patient. Telephone visit today complaining of sinus congestion and possible infection along with cough.  Husband and her developed flulike symptoms about 6 days ago.  Both tested positive for COVID.  She has both natural and vaccine immunity.  Denies difficulty breathing or chest pain.  Denies high fever or chills.  Able to eat and drink.  Denies nausea or vomiting.  Denies abdominal pain or diarrhea.  Denies rash.  Denies any other significant associated symptomatology.  COVID infection seems to be running its course however she is concerned about a possible sinus infection.  Noticed yellow nasal drainage.  HPI   Patient Active Problem List   Diagnosis Date Noted  . Statin myopathy 01/13/2021  . Dysuria 12/22/2020  . Low back pain 12/22/2020  . Closed fracture of left elbow 08/31/2020  . Dyspnea 09/08/2019  . COVID-19 virus infection 09/08/2019   . Acute sinus infection 11/12/2018  . Shingles outbreak 09/04/2018  . Hip pain 09/04/2018  . LUQ pain 07/19/2018  . Bloating 07/19/2018  . 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 Viral URI with cough 04/25/2017  . Urinary frequency 11/20/2016  . Lactose intolerance in adult 09/13/2016  . Cough 12/22/2014  . 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 Well adult exam 12/04/2013  . Back pain, thoracic 08/13/2012  . 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 Urinary incontinence   . Right shoulder pain 12/13/2011  . Sciatica of right side 07/06/2011  . Hyperglycemia 06/07/2011  . Abdominal pain 04/21/2011  . WEAKNESS 09/21/2010  . CAROTID OCCLUSIVE DISEASE 09/07/2010  . WRIST PAIN 06/25/2010  . Diarrhea 10/27/2009  . DIZZINESS 07/28/2009  . TOBACCO USE, QUIT 07/28/2009  . Pain in joint 05/26/2009  . Lumbago 11/05/2008  . Irritable bowel syndrome 06/27/2008  . GASTRITIS, HX OF 06/27/2008  . Rash and other nonspecific skin eruption 06/02/2008  . Dyslipidemia 04/23/2008  . Generalized anxiety disorder 04/23/2008  . DIVERTICULOSIS, COLON 04/23/2008  . GERD 04/02/2008  . Allergic rhinitis 12/11/2007  . Vitamin D deficiency 08/15/2007  . Peripheral vascular disease ([place]) 08/15/2007  . Essential hypertension 03/24/2007    Past Medical History:  Diagnosis Date  . Celiac sprue 2007  . CVD (cardiovascular disease)   . Foot fracture, left 2011  . GERD (gastroesophageal reflux disease)   . Hyperlipidemia   . Osteopenia 04/2019   T score -1.9 FRAX 10% / 1.9%.  Stable from prior DEXA  . PVD (peripheral vascular disease) ([facility])    mild carotid ? fibromuscular dysplasia  . Shingles   .  Urinary incontinence     Current Outpatient Medications  Medication Sig Dispense Refill  . acetaminophen (TYLENOL) 500 MG tablet Take 500 mg by mouth every 6 (six) hours as needed.    [name] alum & mag hydroxide-simeth (MAALOX/MYLANTA) 200-200-20 MG/5ML suspension Take 15 mLs by mouth every 6 (six) hours as needed for indigestion  or heartburn.    [name] aspirin 81 MG tablet Take 81 mg by mouth daily.    [name] azelastine (ASTELIN) 0.1 % nasal spray Place 2 sprays into both nostrils 2 (two) times daily. 30 mL 0  . Bacillus Coagulans-Inulin (ALIGN PREBIOTIC-PROBIOTIC PO) Take 1 capsule by mouth. Takes for 7 days at a time if needed    . Calcium-Vitamin D-Vitamin K (VIACTIV CALCIUM PLUS D PO) Take 1 tablet by mouth daily.    . Cholecalciferol 2000 UNITS TABS Take 1 tablet by mouth daily.    . Coenzyme Q10 (CO Q-10) 100 MG CHEW Chew 1 capsule by mouth daily.    . Cyanocobalamin (B-12) 3000 MCG SUBL Place 1 tablet under the tongue daily as needed (energy).    . diazepam (VALIUM) 5 MG tablet Take 5 mg by mouth 2 (two) times daily as needed.    . dicyclomine (BENTYL) 10 MG capsule Take 1 capsule (10 mg total) by mouth 4 (four) times daily as needed (stomach pains). 100 capsule 1  . diphenoxylate-atropine (LOMOTIL) 2.5-0.025 MG tablet TAKE 1-2 TABLETS BY MOUTH 4 (FOUR) TIMES DAILY AS NEEDED FOR DIARRHEA OR LOOSE STOOLS. 60 tablet 1  . Evolocumab (REPATHA SURECLICK) [id number] MG/ML SOAJ Inject 1 pen into the skin every 14 (fourteen) days. 2 mL 11  . fexofenadine (ALLEGRA) 180 MG tablet Take 180 mg by mouth daily.    [name] FLUoxetine (PROZAC) 10 MG capsule TAKE 1 CAPSULE BY MOUTH  DAILY 90 capsule 3  . fluticasone (FLONASE) 50 MCG/ACT nasal spray USE 2 SPRAYS NASALLY DAILY 48 g 3  . irbesartan (AVAPRO) 300 MG tablet TAKE 0.5 TABLETS (150 MG TOTAL) BY MOUTH 2 (TWO) TIMES DAILY. 90 tablet 2  . labetalol (NORMODYNE) 300 MG tablet TAKE 1 TABLET BY MOUTH  TWICE DAILY 180 tablet 3  . lipase/protease/amylase (CREON) 36000 UNITS CPEP capsule Take 1 capsule (36,000 Units total) by mouth 3 (three) times daily before meals. Take with the first bite of food 90 capsule 3  . LORazepam (ATIVAN) 0.5 MG tablet TAKE 1 TABLET BY MOUTH TWICE A DAY (Patient taking differently: 0.5 mg as needed.) 60 tablet 3  . Multiple Vitamins-Minerals (MULTIVITAMIN WOMEN 50+) TABS Take 1  tablet by mouth daily.    . pantoprazole (PROTONIX) 40 MG tablet Take 1 tablet (40 mg total) by mouth daily. Annual appt due in Feb 2023 must see provider for future refills 90 tablet 1  . rosuvastatin (CRESTOR) 10 MG tablet TAKE 1 TABLET BY MOUTH  EVERY OTHER DAY 45 tablet 3  . rosuvastatin (CRESTOR) 5 MG tablet Take 5 mg by mouth every day. 30 tablet 3  . valACYclovir (VALTREX) 1000 MG tablet Take 2 tablets at the earliest onset of cold sore symptoms.  Repeat with 2 tablets 12 hours later 12 tablet 1   No current facility-administered medications for this visit.    Allergies  Allergen Reactions  . Amlodipine     HA  . Augmentin [Amoxicillin-Pot Clavulanate]     diarrhea  . Clarithromycin     REACTION: gi upset. Can take Zpac  . Codeine Nausea And Vomiting    Can take Prom-cod syr  .  Esomeprazole Magnesium     REACTION: side pain  . Hydrochlorothiazide     REACTION: leg cramps, dehydration  . Iohexol      Desc: PT HAS SWELLING TO FACE,LIPS AND THROAT WITH CONTRAST DYE   . Kapidex [Dexlansoprazole]     rash  . Losartan Potassium     REACTION: HA  . Olmesartan Medoxomil     REACTION: weak legs  . Ramipril     Per pt: unknown reaction  . Risedronate Sodium     Per pt: unknown reaction    Social History   Socioeconomic History  . Marital status: Married    Spouse name: Theotis Burrow  . Number of children: 2  . Years of education: Not on file  . Highest education level: Not on file  Occupational History  . Occupation: semi-retired  Tobacco Use  . Smoking status: Former    Types: Cigarettes    Quit date: 02/11/1970    Years since quitting: 51.2  . Smokeless tobacco: Never  Vaping Use  . Vaping Use: Never used  Substance and Sexual Activity  . Alcohol use: Yes    Alcohol/week: 0.0 standard drinks    Comment: very rare  . Drug use: No  . Sexual activity: Yes    Birth control/protection: Surgical, Post-menopausal    Comment: 1st intercourse 75 yo-Fewer than 5  partners,des neg  Other Topics Concern  . Not on file  Social History Narrative  . Not on file   Social Determinants of Health   Financial Resource Strain: Low Risk   . Difficulty of Paying Living Expenses: Not hard at all  Food Insecurity: No Food Insecurity  . Worried About Charity fundraiser in the Last Year: Never true  . Ran Out of Food in the Last Year: Never true  Transportation Needs: No Transportation Needs  . Lack of Transportation (Medical): No  . Lack of Transportation (Non-Medical): No  Physical Activity: Sufficiently Active  . Days of Exercise per Week: 7 days  . Minutes of Exercise per Session: 40 min  Stress: No Stress Concern Present  . Feeling of Stress : Not at all  Social Connections: Unknown  . Frequency of Communication with Friends and Family: More than three times a week  . Frequency of Social Gatherings with Friends and Family: Once a week  . Attends Religious Services: Patient refused  . Active Member of Clubs or Organizations: Patient refused  . Attends Archivist Meetings: Patient refused  . Marital Status: Married  Human resources officer Violence: Not on file    Review of Systems  Constitutional: Negative.  Negative for chills and fever.  HENT:  Positive for congestion and sinus pain. Negative for ear pain and sore throat.   Respiratory:  Positive for cough. Negative for shortness of breath.   Cardiovascular:  Negative for chest pain and palpitations.  Gastrointestinal: Negative.  Negative for abdominal pain, diarrhea, nausea and vomiting.  Genitourinary: Negative.  Negative for dysuria and hematuria.  Musculoskeletal: Negative.  Negative for joint pain and myalgias.  Skin: Negative.   Neurological: Negative.  Negative for dizziness and headaches.  All other systems reviewed and are negative.  Objective   Vitals as reported by the patient: There were no vitals filed for this visit. Alert and oriented x3 no apparent respiratory  distress Diagnoses and all orders for this visit:  COVID-19 virus infection  Cough -     HYDROcodone bit-homatropine (HYCODAN) 5-1.5 MG/5ML syrup; Take 5 mLs by mouth  at bedtime as needed for cough.  Sinus congestion  Acute non-recurrent maxillary sinusitis -     azithromycin (ZITHROMAX) 250 MG tablet; Sig as indicated  Clinically stable.  No red flag signs or symptoms.  COVID infection running its course without complications. However patient may have secondary bacterial sinus infection.  May benefit from azithromycin which she has taken before for similar problems.  Will treat symptoms as needed.  COVID advice given.  ED precautions given. Advised to contact the office if no better or worse during the next several days.   I discussed the assessment and treatment plan with the patient. The patient was provided an opportunity to ask questions and all were answered. The patient agreed with the plan and demonstrated an understanding of the instructions.   The patient was advised to call back or seek an in-person evaluation if the symptoms worsen or if the condition fails to improve as anticipated.  I provided 20 minutes of non-face-to-face time during this encounter.  Horald Pollen, MD  Primary Care at [facility]

## 2021-05-15 DIAGNOSIS — M25511 Pain in right shoulder: Secondary | ICD-10-CM | POA: Diagnosis not present

## 2021-05-25 DIAGNOSIS — M25551 Pain in right hip: Secondary | ICD-10-CM | POA: Diagnosis not present

## 2021-05-31 ENCOUNTER — Telehealth: Payer: Self-pay | Admitting: Internal Medicine

## 2021-05-31 DIAGNOSIS — M7541 Impingement syndrome of right shoulder: Secondary | ICD-10-CM | POA: Diagnosis not present

## 2021-05-31 NOTE — Telephone Encounter (Signed)
Patient says she has some questions regarding a couple of meds that she is taking & is requesting nurse call her back  Please call [phone]

## 2021-06-02 NOTE — Telephone Encounter (Signed)
Follow up message  Seeking advice Patient calling to report Protonix is not helping.  Also she would like to know when she should get flu shot. She had covid  during the month of September and wants to know the timeframe she should wait before getting flu shot

## 2021-06-04 MED ORDER — FAMOTIDINE 40 MG PO TABS: 40.0000 mg | ORAL_TABLET | Freq: Every evening | ORAL | 3 refills | Status: DC

## 2021-06-04 NOTE — Telephone Encounter (Signed)
Called pt there was no answer LMOM w/MD response../lmb 

## 2021-06-04 NOTE — Telephone Encounter (Signed)
Take pantoprazole in the morning and famotidine at night (prescription emailed).  Thanks

## 2021-06-08 ENCOUNTER — Ambulatory Visit: Payer: Medicare Other | Admitting: Nurse Practitioner

## 2021-06-14 ENCOUNTER — Ambulatory Visit (INDEPENDENT_AMBULATORY_CARE_PROVIDER_SITE_OTHER): Payer: Medicare Other

## 2021-06-14 ENCOUNTER — Other Ambulatory Visit: Payer: Self-pay

## 2021-06-14 VITALS — BP 118/70 | HR 61 | Temp 98.4°F | Ht 59.0 in | Wt 126.0 lb

## 2021-06-14 DIAGNOSIS — Z23 Encounter for immunization: Secondary | ICD-10-CM | POA: Diagnosis not present

## 2021-06-14 DIAGNOSIS — Z0000 Encounter for general adult medical examination without abnormal findings: Secondary | ICD-10-CM

## 2021-06-14 NOTE — Progress Notes (Addendum)
Subjective:   Sharon Paul is a 75 y.o. female who presents for Medicare Annual (Subsequent) preventive examination.  Review of Systems     Cardiac Risk Factors include: advanced age (>75men, >76 women);dyslipidemia;family history of premature cardiovascular disease     Objective:    Today's Vitals   06/14/21 1501  BP: 118/70  Pulse: 61  Temp: 98.4 F (36.9 C)  SpO2: 98%  Weight: 126 lb (57.2 kg)  Height: 4\' 11"  (1.499 m)  PainSc: 0-No pain   Body mass index is 25.45 kg/m.  Advanced Directives 06/14/2021 06/11/2020 05/29/2018  Does Patient Have a Medical Advance Directive? No No No  Does patient want to make changes to medical advance directive? - - Yes (ED - Information included in AVS)  Would patient like information on creating a medical advance directive? Yes (MAU/Ambulatory/Procedural Areas - Information given) Yes (MAU/Ambulatory/Procedural Areas - Information given) -    Current Medications (verified) Outpatient Encounter Medications as of 06/14/2021  Medication Sig   acetaminophen (TYLENOL) 500 MG tablet Take 500 mg by mouth every 6 (six) hours as needed.   alum & mag hydroxide-simeth (MAALOX/MYLANTA) 200-200-20 MG/5ML suspension Take 15 mLs by mouth every 6 (six) hours as needed for indigestion or heartburn.   aspirin 81 MG tablet Take 81 mg by mouth daily.   azelastine (ASTELIN) 0.1 % nasal spray Place 2 sprays into both nostrils 2 (two) times daily.   azithromycin (ZITHROMAX) 250 MG tablet Sig as indicated   Bacillus Coagulans-Inulin (ALIGN PREBIOTIC-PROBIOTIC PO) Take 1 capsule by mouth. Takes for 7 days at a time if needed   Calcium-Vitamin D-Vitamin K (VIACTIV CALCIUM PLUS D PO) Take 1 tablet by mouth daily.   Cholecalciferol 2000 UNITS TABS Take 1 tablet by mouth daily.   Coenzyme Q10 (CO Q-10) 100 MG CHEW Chew 1 capsule by mouth daily.   Cyanocobalamin (B-12) 3000 MCG SUBL Place 1 tablet under the tongue daily as needed (energy).   diazepam  (VALIUM) 5 MG tablet Take 5 mg by mouth 2 (two) times daily as needed.   dicyclomine (BENTYL) 10 MG capsule Take 1 capsule (10 mg total) by mouth 4 (four) times daily as needed (stomach pains).   diphenoxylate-atropine (LOMOTIL) 2.5-0.025 MG tablet TAKE 1-2 TABLETS BY MOUTH 4 (FOUR) TIMES DAILY AS NEEDED FOR DIARRHEA OR LOOSE STOOLS.   Evolocumab (REPATHA SURECLICK) 409 MG/ML SOAJ Inject 1 pen into the skin every 14 (fourteen) days.   famotidine (PEPCID) 40 MG tablet Take 1 tablet (40 mg total) by mouth at bedtime.   fexofenadine (ALLEGRA) 180 MG tablet Take 180 mg by mouth daily.   FLUoxetine (PROZAC) 10 MG capsule TAKE 1 CAPSULE BY MOUTH  DAILY   fluticasone (FLONASE) 50 MCG/ACT nasal spray USE 2 SPRAYS NASALLY DAILY   HYDROcodone bit-homatropine (HYCODAN) 5-1.5 MG/5ML syrup Take 5 mLs by mouth at bedtime as needed for cough.   irbesartan (AVAPRO) 300 MG tablet TAKE 0.5 TABLETS (150 MG TOTAL) BY MOUTH 2 (TWO) TIMES DAILY.   labetalol (NORMODYNE) 300 MG tablet TAKE 1 TABLET BY MOUTH  TWICE DAILY   lipase/protease/amylase (CREON) 36000 UNITS CPEP capsule Take 1 capsule (36,000 Units total) by mouth 3 (three) times daily before meals. Take with the first bite of food   LORazepam (ATIVAN) 0.5 MG tablet TAKE 1 TABLET BY MOUTH TWICE A DAY (Patient taking differently: 0.5 mg as needed.)   Multiple Vitamins-Minerals (MULTIVITAMIN WOMEN 50+) TABS Take 1 tablet by mouth daily.   pantoprazole (PROTONIX) 40 MG tablet Take  1 tablet (40 mg total) by mouth daily. Annual appt due in Feb 2023 must see provider for future refills   predniSONE (DELTASONE) 10 MG tablet Take 10 mg by mouth daily with breakfast.   rosuvastatin (CRESTOR) 10 MG tablet TAKE 1 TABLET BY MOUTH  EVERY OTHER DAY   rosuvastatin (CRESTOR) 5 MG tablet Take 5 mg by mouth every day.   valACYclovir (VALTREX) 1000 MG tablet Take 2 tablets at the earliest onset of cold sore symptoms.  Repeat with 2 tablets 12 hours later   No facility-administered  encounter medications on file as of 06/14/2021.    Allergies (verified) Amlodipine, Augmentin [amoxicillin-pot clavulanate], Clarithromycin, Codeine, Esomeprazole magnesium, Hydrochlorothiazide, Iohexol, Kapidex [dexlansoprazole], Losartan potassium, Olmesartan medoxomil, Ramipril, and Risedronate sodium   History: Past Medical History:  Diagnosis Date   Celiac sprue 2007   CVD (cardiovascular disease)    Foot fracture, left 2011   GERD (gastroesophageal reflux disease)    Hyperlipidemia    Osteopenia 04/2019   T score -1.9 FRAX 10% / 1.9%.  Stable from prior DEXA   PVD (peripheral vascular disease) (HCC)    mild carotid ? fibromuscular dysplasia   Shingles    Urinary incontinence    Past Surgical History:  Procedure Laterality Date   APPENDECTOMY     BREAST SURGERY     right breast biopsy   ESOPHAGOGASTRODUODENOSCOPY     ROTATOR CUFF REPAIR     RIGHT   ROTATOR CUFF REPAIR     Left   stress cardiolite  04-18-00   VAGINAL HYSTERECTOMY  1993   leiomyomata   Family History  Problem Relation Age of Onset   Glaucoma Mother    Heart disease Mother    Hypertension Mother    Heart disease Father    Hypertension Father    Stroke Father    Hypertension Paternal Grandmother    Lung cancer Paternal Grandmother    Hypertension Sister    Hypertension Brother    Glaucoma Brother    Colon cancer Neg Hx    Social History   Socioeconomic History   Marital status: Married    Spouse name: Theotis Burrow   Number of children: 2   Years of education: Not on file   Highest education level: Not on file  Occupational History   Occupation: semi-retired  Tobacco Use   Smoking status: Former    Types: Cigarettes    Quit date: 02/11/1970    Years since quitting: 51.3   Smokeless tobacco: Never  Vaping Use   Vaping Use: Never used  Substance and Sexual Activity   Alcohol use: Yes    Alcohol/week: 0.0 standard drinks    Comment: very rare   Drug use: No   Sexual activity: Yes     Birth control/protection: Surgical, Post-menopausal    Comment: 1st intercourse 75 yo-Fewer than 5 partners,des neg  Other Topics Concern   Not on file  Social History Narrative   Not on file   Social Determinants of Health   Financial Resource Strain: Low Risk    Difficulty of Paying Living Expenses: Not hard at all  Food Insecurity: No Food Insecurity   Worried About Charity fundraiser in the Last Year: Never true   [place] in the Last Year: Never true  Transportation Needs: No Transportation Needs   Lack of Transportation (Medical): No   Lack of Transportation (Non-Medical): No  Physical Activity: Sufficiently Active   Days of Exercise per Week: 5 days  Minutes of Exercise per Session: 30 min  Stress: No Stress Concern Present   Feeling of Stress : Not at all  Social Connections: Socially Integrated   Frequency of Communication with Friends and Family: More than three times a week   Frequency of Social Gatherings with Friends and Family: More than three times a week   Attends Religious Services: More than 4 times per year   Active Member of [employer] or Organizations: Yes   Attends Music therapist: More than 4 times per year   Marital Status: Married    Tobacco Counseling Counseling given: Not Answered   Clinical Intake:  Pre-visit preparation completed: Yes  Pain : No/denies pain Pain Score: 0-No pain     BMI - recorded: 25.45 Nutritional Status: BMI 25 -29 Overweight Nutritional Risks: None Diabetes: No  How often do you need to have someone help you when you read instructions, pamphlets, or other written materials from your doctor or pharmacy?: 1 - Never What is the last grade level you completed in school?: 2 years of college  Diabetic? no  Interpreter Needed?: No  Information entered by :: Lisette Abu, LPN   Activities of Daily Living In your present state of health, do you have any difficulty performing the following  activities: 06/14/2021  Hearing? N  Vision? N  Difficulty concentrating or making decisions? N  Walking or climbing stairs? N  Dressing or bathing? N  Doing errands, shopping? N  Preparing Food and eating ? N  Using the Toilet? N  In the past six months, have you accidently leaked urine? Y  Do you have problems with loss of bowel control? N  Managing your Medications? N  Managing your Finances? N  Housekeeping or managing your Housekeeping? N  Some recent data might be hidden    Patient Care Team: Plotnikov, Evie Lacks, MD as PCP - General Armbruster, Carlota Raspberry, MD as Consulting Physician (Gastroenterology) Justice Britain, MD as Consulting Physician (Orthopedic Surgery) [facility] as Consulting Physician (Ophthalmology) Delice Bison, Darnelle Maffucci, [facility] (Pharmacist) Delice Bison Darnelle Maffucci, [facility] as Pharmacist (Pharmacist)  Indicate any recent Medical Services you may have received from other than [facility] providers in the past year (date may be approximate).     Assessment:   This is a routine wellness examination for Emiah.  Hearing/Vision screen Hearing Screening - Comments:: Patient denied any hearing difficulty.   No hearing aids.  Vision Screening - Comments:: Patient wears corrective glasses/contacts.  Eye exam done annually by: Dr. Thalia Bloodgood  Dietary issues and exercise activities discussed: Current Exercise Habits: Home exercise routine, Type of exercise: walking, Time (Minutes): 50, Frequency (Times/Week): 3, Weekly Exercise (Minutes/Week): 150, Intensity: Moderate   Goals Addressed   None   Depression Screen PHQ 2/9 Scores 06/14/2021 01/21/2021 12/22/2020 10/13/2020 06/11/2020 10/09/2019 06/06/2019  PHQ - 2 Score 0 0 0 0 0 0 0  PHQ- 9 Score - 0 - - - - -    Fall Risk Fall Risk  06/14/2021 12/22/2020 10/13/2020 06/11/2020 10/09/2019  Falls in the past year? 0 0 0 0 0  Number falls in past yr: 0 0 0 0 0  Injury with Fall? 0 0 0 0 0  Risk for fall due to : No Fall  Risks No Fall Risks No Fall Risks No Fall Risks -  Follow up Falls evaluation completed - - Falls evaluation completed;Education provided -    FALL RISK PREVENTION PERTAINING TO THE HOME:  Any stairs in or around  the home? No  If so, are there any without handrails? No  Home free of loose throw rugs in walkways, pet beds, electrical cords, etc? Yes  Adequate lighting in your home to reduce risk of falls? Yes   ASSISTIVE DEVICES UTILIZED TO PREVENT FALLS:  Life alert? Yes  Use of a cane, walker or w/c? No  Grab bars in the bathroom? No  Shower chair or bench in shower? No  Elevated toilet seat or a handicapped toilet? No   TIMED UP AND GO:  Was the test performed? Yes .  Length of time to ambulate 10 feet: 6 sec.   Gait steady and fast without use of assistive device  Cognitive Function: Normal cognitive status assessed by direct observation by this Nurse Health Advisor. No abnormalities found.       6CIT Screen 06/11/2020 06/06/2019  What Year? 0 points 0 points  What month? 0 points 0 points  What time? 0 points 0 points  Count back from 20 0 points 0 points  Months in reverse 0 points 0 points  Repeat phrase 0 points 0 points  Total Score 0 0    Immunizations Immunization History  Administered Date(s) Administered   Fluad Quad(high Dose 65+) 06/06/2019, 06/11/2020   Influenza Split 07/04/2012   Influenza Whole 07/02/2008, 06/01/2010   Influenza, High Dose Seasonal PF 05/31/2016, 05/26/2017, 05/29/2018   Influenza,inj,Quad PF,6+ Mos 06/02/2015   Influenza,inj,quad, With Preservative 05/29/2017, 05/29/2018, 05/30/2019   Influenza-Unspecified 06/16/2013, 05/29/2014   PFIZER(Purple Top)SARS-COV-2 Vaccination 11/13/2019, 12/04/2019, 07/11/2020   Pneumococcal Conjugate-13 01/14/2015   Pneumococcal Polysaccharide-23 12/04/2013   Td 08/02/2000   Tdap 10/06/2015   Zoster Recombinat (Shingrix) 05/11/2020, 12/14/2020   Zoster, Live 09/21/2009    TDAP status: Up to  date  Flu Vaccine status: Up to date  Pneumococcal vaccine status: Up to date  Covid-19 vaccine status: Completed vaccines  Qualifies for Shingles Vaccine? Yes   Zostavax completed Yes   Shingrix Completed?: Yes  Screening Tests Health Maintenance  Topic Date Due   COVID-19 Vaccine (4 - Booster for Pfizer series) 10/03/2020   INFLUENZA VACCINE  03/29/2021   MAMMOGRAM  06/23/2022   COLONOSCOPY (Pts 45-54yrs Insurance coverage will need to be confirmed)  03/07/2023   TETANUS/TDAP  10/05/2025   DEXA SCAN  Completed   Hepatitis C Screening  Completed   Zoster Vaccines- Shingrix  Completed   HPV VACCINES  Aged Out    Health Maintenance  Health Maintenance Due  Topic Date Due   COVID-19 Vaccine (4 - Booster for [place] series) 10/03/2020   INFLUENZA VACCINE  03/29/2021    Colorectal cancer screening: Type of screening: Colonoscopy. Completed 03/06/2013. Repeat every 10 years  Mammogram status: Completed 06/23/2020. Repeat every year (scheduled for 06/2021)  Bone Density status: Completed 05/07/2019. Results reflect: Bone density results: OSTEOPENIA. Repeat every 2-3 years.  Lung Cancer Screening: (Low Dose CT Chest recommended if Age 52-80 years, 30 pack-year currently smoking OR have quit w/in 15years.) does not qualify.   Lung Cancer Screening Referral: no  Additional Screening:  Hepatitis C Screening: does qualify; Completed yes  Vision Screening: Recommended annual ophthalmology exams for early detection of glaucoma and other disorders of the eye. Is the patient up to date with their annual eye exam?  Yes  Who is the provider or what is the name of the office in which the patient attends annual eye exams? Dr. Thalia Bloodgood If pt is not established with a provider, would they like to be referred to  a provider to establish care? No .   Dental Screening: Recommended annual dental exams for proper oral hygiene  Community Resource Referral / Chronic Care Management: CRR  required this visit?  No   CCM required this visit?  No      Plan:     I have personally reviewed and noted the following in the patient's chart:   Medical and social history Use of alcohol, tobacco or illicit drugs  Current medications and supplements including opioid prescriptions.  Functional ability and status Nutritional status Physical activity Advanced directives List of other physicians Hospitalizations, surgeries, and ER visits in previous 12 months Vitals Screenings to include cognitive, depression, and falls Referrals and appointments  In addition, I have reviewed and discussed with patient certain preventive protocols, quality metrics, and best practice recommendations. A written personalized care plan for preventive services as well as general preventive health recommendations were provided to patient.     Sheral Flow, LPN   [id number]   Nurse Notes:  Hearing Screening - Comments:: Patient denied any hearing difficulty.   No hearing aids.  Vision Screening - Comments:: Patient wears corrective glasses/contacts.  Eye exam done annually by: Dr. Thalia Bloodgood   Medical screening examination/treatment/procedure(s) were performed by non-physician practitioner and as supervising physician I was immediately available for consultation/collaboration.  I agree with above. Lew Dawes, MD

## 2021-06-14 NOTE — Patient Instructions (Signed)
[name] , Thank you for taking time to come for your Medicare Wellness Visit. I appreciate your ongoing commitment to your health goals. Please review the following plan we discussed and let me know if I can assist you in the future.   Screening recommendations/referrals: Colonoscopy: 03/06/2013; due every 10 years Mammogram: scheduled for 06/2021 Bone Density: last done at [place]; need dates Recommended yearly ophthalmology/optometry visit for glaucoma screening and checkup Recommended yearly dental visit for hygiene and checkup  Vaccinations: Influenza vaccine: 06/14/2021 Pneumococcal vaccine: 12/04/2013, 01/14/2015 Tdap vaccine: 10/06/2015; due every 10 years Shingles vaccine: 05/11/2020, 4/18/20222   Covid-19: 11/13/2019, 12/04/2019, 07/11/2020  Advanced directives: Advance directive discussed with you today. I have provided a copy for you to complete at home and have notarized. Once this is complete please bring a copy in to our office so we can scan it into your chart.  Conditions/risks identified: Yes; Client understands the importance of follow-up with providers by attending scheduled visits and discussed goals to eat healthier, increase physical activity, exercise the brain, socialize more, get enough sleep and make time for laughter.  Next appointment: Please schedule your next Medicare Wellness Visit with your Nurse Health Advisor in 1 year by calling [phone].   Preventive Care 74 Years and Older, Female Preventive care refers to lifestyle choices and visits with your health care provider that can promote health and wellness. What does preventive care include? A yearly physical exam. This is also called an annual well check. Dental exams once or twice a year. Routine eye exams. Ask your health care provider how often you should have your eyes checked. Personal lifestyle choices, including: Daily care of your teeth and gums. Regular physical  activity. Eating a healthy diet. Avoiding tobacco and drug use. Limiting alcohol use. Practicing safe sex. Taking low-dose aspirin every day. Taking vitamin and mineral supplements as recommended by your health care provider. What happens during an annual well check? The services and screenings done by your health care provider during your annual well check will depend on your age, overall health, lifestyle risk factors, and family history of disease. Counseling  Your health care provider may ask you questions about your: Alcohol use. Tobacco use. Drug use. Emotional well-being. Home and relationship well-being. Sexual activity. Eating habits. History of falls. Memory and ability to understand (cognition). Work and work Statistician. Reproductive health. Screening  You may have the following tests or measurements: Height, weight, and BMI. Blood pressure. Lipid and cholesterol levels. These may be checked every 5 years, or more frequently if you are over 65 years old. Skin check. Lung cancer screening. You may have this screening every year starting at age 17 if you have a 30-pack-year history of smoking and currently smoke or have quit within the past 15 years. Fecal occult blood test (FOBT) of the stool. You may have this test every year starting at age 78. Flexible sigmoidoscopy or colonoscopy. You may have a sigmoidoscopy every 5 years or a colonoscopy every 10 years starting at age 87. Hepatitis C blood test. Hepatitis B blood test. Sexually transmitted disease (STD) testing. Diabetes screening. This is done by checking your blood sugar (glucose) after you have not eaten for a while (fasting). You may have this done every 1-3 years. Bone density scan. This is done to screen for osteoporosis. You may have this done starting at age 83. Mammogram. This may be done every 1-2 years. Talk to your health care provider about how often you should have  regular mammograms. Talk with your  health care provider about your test results, treatment options, and if necessary, the need for more tests. Vaccines  Your health care provider may recommend certain vaccines, such as: Influenza vaccine. This is recommended every year. Tetanus, diphtheria, and acellular pertussis (Tdap, Td) vaccine. You may need a Td booster every 10 years. Zoster vaccine. You may need this after age 40. Pneumococcal 13-valent conjugate (PCV13) vaccine. One dose is recommended after age 72. Pneumococcal polysaccharide (PPSV23) vaccine. One dose is recommended after age 20. Talk to your health care provider about which screenings and vaccines you need and how often you need them. This information is not intended to replace advice given to you by your health care provider. Make sure you discuss any questions you have with your health care provider. Document Released: 09/11/2015 Document Revised: 05/04/2016 Document Reviewed: 06/16/2015 Elsevier Interactive Patient Education  2017 [place] Prevention in the Home Falls can cause injuries. They can happen to people of all ages. There are many things you can do to make your home safe and to help prevent falls. What can I do on the outside of my home? Regularly fix the edges of walkways and driveways and fix any cracks. Remove anything that might make you trip as you walk through a door, such as a raised step or threshold. Trim any bushes or trees on the path to your home. Use bright outdoor lighting. Clear any walking paths of anything that might make someone trip, such as rocks or tools. Regularly check to see if handrails are loose or broken. Make sure that both sides of any steps have handrails. Any raised decks and porches should have guardrails on the edges. Have any leaves, snow, or ice cleared regularly. Use sand or salt on walking paths during winter. Clean up any spills in your garage right away. This includes oil or grease spills. What can I  do in the bathroom? Use night lights. Install grab bars by the toilet and in the tub and shower. Do not use towel bars as grab bars. Use non-skid mats or decals in the tub or shower. If you need to sit down in the shower, use a plastic, non-slip stool. Keep the floor dry. Clean up any water that spills on the floor as soon as it happens. Remove soap buildup in the tub or shower regularly. Attach bath mats securely with double-sided non-slip rug tape. Do not have throw rugs and other things on the floor that can make you trip. What can I do in the bedroom? Use night lights. Make sure that you have a light by your bed that is easy to reach. Do not use any sheets or blankets that are too big for your bed. They should not hang down onto the floor. Have a firm chair that has side arms. You can use this for support while you get dressed. Do not have throw rugs and other things on the floor that can make you trip. What can I do in the kitchen? Clean up any spills right away. Avoid walking on wet floors. Keep items that you use a lot in easy-to-reach places. If you need to reach something above you, use a strong step stool that has a grab bar. Keep electrical cords out of the way. Do not use floor polish or wax that makes floors slippery. If you must use wax, use non-skid floor wax. Do not have throw rugs and other things on the floor that  can make you trip. What can I do with my stairs? Do not leave any items on the stairs. Make sure that there are handrails on both sides of the stairs and use them. Fix handrails that are broken or loose. Make sure that handrails are as long as the stairways. Check any carpeting to make sure that it is firmly attached to the stairs. Fix any carpet that is loose or worn. Avoid having throw rugs at the top or bottom of the stairs. If you do have throw rugs, attach them to the floor with carpet tape. Make sure that you have a light switch at the top of the stairs  and the bottom of the stairs. If you do not have them, ask someone to add them for you. What else can I do to help prevent falls? Wear shoes that: Do not have high heels. Have rubber bottoms. Are comfortable and fit you well. Are closed at the toe. Do not wear sandals. If you use a stepladder: Make sure that it is fully opened. Do not climb a closed stepladder. Make sure that both sides of the stepladder are locked into place. Ask someone to hold it for you, if possible. Clearly mark and make sure that you can see: Any grab bars or handrails. First and last steps. Where the edge of each step is. Use tools that help you move around (mobility aids) if they are needed. These include: Canes. Walkers. Scooters. Crutches. Turn on the lights when you go into a dark area. Replace any light bulbs as soon as they burn out. Set up your furniture so you have a clear path. Avoid moving your furniture around. If any of your floors are uneven, fix them. If there are any pets around you, be aware of where they are. Review your medicines with your doctor. Some medicines can make you feel dizzy. This can increase your chance of falling. Ask your doctor what other things that you can do to help prevent falls. This information is not intended to replace advice given to you by your health care provider. Make sure you discuss any questions you have with your health care provider. Document Released: 06/11/2009 Document Revised: 01/21/2016 Document Reviewed: 09/19/2014 Elsevier Interactive Patient Education  2017 [employer].

## 2021-06-18 ENCOUNTER — Other Ambulatory Visit: Payer: Self-pay | Admitting: Internal Medicine

## 2021-06-25 ENCOUNTER — Other Ambulatory Visit: Payer: Self-pay | Admitting: Internal Medicine

## 2021-06-26 ENCOUNTER — Other Ambulatory Visit: Payer: Self-pay | Admitting: Internal Medicine

## 2021-07-01 ENCOUNTER — Telehealth: Payer: Self-pay

## 2021-07-01 NOTE — Progress Notes (Signed)
Chronic Care Management Pharmacy Assistant   Name: Sharon Paul  MRN: [id number] DOB: 1946/07/03  Reason for Encounter: Disease State   Conditions to be addressed/monitored: General    Recent office visits:  03/04/21 Plotnikov, Evie Lacks, MD-PCP (Diarrhea) orders: referrals to Gastroenterology and Rheumatology Medication changes: lipase/protease/amylase (CREON) 36000 UNITS CPEP capsule  Recent consult visits:  05/31/21 Justice Britain M-Orthopaedic Surgery [place]  05/25/21 Latanya Maudlin A-Orthopaedic Surgery [place]  05/15/21 Justice Britain M-Orthopaedic Surgery [place]  05/11/21 Horald Pollen, MD-Internal Medicine (COVID-19 virus infection) video visit  Medication changes: azithromycin (ZITHROMAX) 250 MG tablet, HYDROcodone bit-homatropine (HYCODAN) 5-1.5 MG/5ML syrup  02/03/21 Latanya Maudlin A-Orthopaedic Surgery [facility] visits:  None in previous 6 months  Medications: Outpatient Encounter Medications as of 07/01/2021  Medication Sig   acetaminophen (TYLENOL) 500 MG tablet Take 500 mg by mouth every 6 (six) hours as needed.   alum & mag hydroxide-simeth (MAALOX/MYLANTA) 200-200-20 MG/5ML suspension Take 15 mLs by mouth every 6 (six) hours as needed for indigestion or heartburn.   aspirin 81 MG tablet Take 81 mg by mouth daily.   azelastine (ASTELIN) 0.1 % nasal spray Place 2 sprays into both nostrils 2 (two) times daily.   azithromycin (ZITHROMAX) 250 MG tablet Sig as indicated   Bacillus Coagulans-Inulin (ALIGN PREBIOTIC-PROBIOTIC PO) Take 1 capsule by mouth. Takes for 7 days at a time if needed   Calcium-Vitamin D-Vitamin K (VIACTIV CALCIUM PLUS D PO) Take 1 tablet by mouth daily.   Cholecalciferol 2000 UNITS TABS Take 1 tablet by mouth daily.   Coenzyme Q10 (CO Q-10) 100 MG CHEW Chew 1 capsule by mouth daily.   Cyanocobalamin (B-12) 3000 MCG SUBL Place 1 tablet under the tongue daily as needed (energy).   diazepam  (VALIUM) 5 MG tablet Take 5 mg by mouth 2 (two) times daily as needed.   dicyclomine (BENTYL) 10 MG capsule Take 1 capsule (10 mg total) by mouth 4 (four) times daily as needed (stomach pains).   diphenoxylate-atropine (LOMOTIL) 2.5-0.025 MG tablet TAKE 1-2 TABLETS BY MOUTH 4 (FOUR) TIMES DAILY AS NEEDED FOR DIARRHEA OR LOOSE STOOLS.   Evolocumab (REPATHA SURECLICK) 829 MG/ML SOAJ Inject 1 pen into the skin every 14 (fourteen) days.   famotidine (PEPCID) 40 MG tablet Take 1 tablet (40 mg total) by mouth at bedtime.   fexofenadine (ALLEGRA) 180 MG tablet Take 180 mg by mouth daily.   FLUoxetine (PROZAC) 10 MG capsule TAKE 1 CAPSULE BY MOUTH  DAILY   fluticasone (FLONASE) 50 MCG/ACT nasal spray USE 2 SPRAYS NASALLY DAILY   HYDROcodone bit-homatropine (HYCODAN) 5-1.5 MG/5ML syrup Take 5 mLs by mouth at bedtime as needed for cough.   irbesartan (AVAPRO) 300 MG tablet TAKE 0.5 TABLETS (150 MG TOTAL) BY MOUTH 2 (TWO) TIMES DAILY.   labetalol (NORMODYNE) 300 MG tablet TAKE 1 TABLET BY MOUTH 2 TIMES DAILY.   lipase/protease/amylase (CREON) 36000 UNITS CPEP capsule Take 1 capsule (36,000 Units total) by mouth 3 (three) times daily before meals. Take with the first bite of food   LORazepam (ATIVAN) 0.5 MG tablet TAKE 1 TABLET BY MOUTH TWICE A DAY (Patient taking differently: 0.5 mg as needed.)   Multiple Vitamins-Minerals (MULTIVITAMIN WOMEN 50+) TABS Take 1 tablet by mouth daily.   pantoprazole (PROTONIX) 40 MG tablet Take 1 tablet (40 mg total) by mouth daily. Annual appt due in Feb 2023 must see provider for future refills   predniSONE (DELTASONE) 10 MG tablet Take 10 mg by mouth daily with breakfast.  rosuvastatin (CRESTOR) 10 MG tablet TAKE 1 TABLET BY MOUTH  EVERY OTHER DAY   rosuvastatin (CRESTOR) 5 MG tablet TAKE 1 TABLET BY MOUTH EVERY OTHER DAY   valACYclovir (VALTREX) 1000 MG tablet Take 2 tablets at the earliest onset of cold sore symptoms.  Repeat with 2 tablets 12 hours later   No  facility-administered encounter medications on file as of 07/01/2021.   Have you had any problems recently with your health? Patient states she does not have any new health issues   Have you had any problems with your pharmacy? Patient states that she does not have any problems with getting any medication or the cost of medications from the pharmacy  What issues or side effects are you having with your medications? Patient states she is not having any side effects from medications   What would you like me to pass along to [facility] for them to help you with? Patient states to tell Linna Hoff she does not have any concerns about her health at this time  What can we do to take care of you better? Patient states she appreciates the call  Care Gaps: Colonoscopy-03/06/13 Diabetic Foot Exam-NA Mammogram-06/23/20 Ophthalmology-NA Dexa Scan - NA Annual Well Visit - NA Micro albumin-NA Hemoglobin A1c- 10/23/20  Star Rating Drugs: Rosuvastatin 5 mg-last fill 06/25/21 90 ds Irbesartan 300 mg-last fill 04/09/21 90 ds  Ethelene Hal Clinical Pharmacist Assistant [phone]

## 2021-07-02 DIAGNOSIS — H43812 Vitreous degeneration, left eye: Secondary | ICD-10-CM | POA: Diagnosis not present

## 2021-07-02 DIAGNOSIS — H25813 Combined forms of age-related cataract, bilateral: Secondary | ICD-10-CM | POA: Diagnosis not present

## 2021-07-02 DIAGNOSIS — H02831 Dermatochalasis of right upper eyelid: Secondary | ICD-10-CM | POA: Diagnosis not present

## 2021-07-02 DIAGNOSIS — H04123 Dry eye syndrome of bilateral lacrimal glands: Secondary | ICD-10-CM | POA: Diagnosis not present

## 2021-07-02 DIAGNOSIS — H43392 Other vitreous opacities, left eye: Secondary | ICD-10-CM | POA: Diagnosis not present

## 2021-07-02 DIAGNOSIS — H02834 Dermatochalasis of left upper eyelid: Secondary | ICD-10-CM | POA: Diagnosis not present

## 2021-07-05 DIAGNOSIS — M130 Polyarthritis, unspecified: Secondary | ICD-10-CM | POA: Diagnosis not present

## 2021-07-05 DIAGNOSIS — M7989 Other specified soft tissue disorders: Secondary | ICD-10-CM | POA: Diagnosis not present

## 2021-07-05 DIAGNOSIS — M150 Primary generalized (osteo)arthritis: Secondary | ICD-10-CM | POA: Diagnosis not present

## 2021-07-06 DIAGNOSIS — Z1231 Encounter for screening mammogram for malignant neoplasm of breast: Secondary | ICD-10-CM | POA: Diagnosis not present

## 2021-07-06 LAB — HM MAMMOGRAPHY

## 2021-07-09 ENCOUNTER — Encounter: Payer: Self-pay | Admitting: Internal Medicine

## 2021-07-19 NOTE — Progress Notes (Signed)
I, Wendy Poet, LAT, ATC, am serving as scribe for Dr. [name].   Subjective:    CC: R hip and R shoulder pain  HPI: Pt is a 75 y/o female c/o R hip pain x couple years that has been progressively worsening. Pt was seen by [facility] on 05/25/21 for this issue and was given a R GT steroid injection. Pt locates pain to her R lateral hip/GT.  Aggravates: walking; sitting crossed leg w/ R leg crossed over L; laying on her R side Treatments tried: R GT injection on 05/25/21; PT in 2021; Tylenol  Pt also c/o R shoulder pain x 3-4 months. Pt has been seen by [facility] on 05/31/21 for this issue.  She has a hx of a R RC repair in 2002 and L biceps tenodesis in May 2021.  Pt locates pain to her R lateral shoulder.  Radiates: yes into her R upper arm Mechanical symptoms: yes Aggravates: daily chores; cleaning; reaching; end-range overhead ROM Treatments tried: R shoulder injection on 05/31/21; Tylenol  Diagnostic imaging: prior R shoulder XR and MRI completed at [facility]  Pertinent review of Systems: No fevers or chills  Relevant historical information: Peripheral vascular disease.   Objective:    Vitals:   07/20/21 1303  BP: 112/62  Pulse: (!) 54  SpO2: 98%   General: Well Developed, well nourished, and in no acute distress.   MSK: Right shoulder mature scar superior shoulder from open rotator cuff surgery Shoulder range of motion intact abduction external and internal rotation. Strength intact abduction external and internal rotation pain with abduction. Mildly positive Hawkins and Neer's test. Negative Yergason's and speeds test.  Right hip: Normal-appearing Normal range of motion. Tender palpation greater trochanter. Hip abduction strength diminished 4/5.  External rotation strength intact. Minimal antalgic gait with initial gait.   Lab and Radiology Results  Diagnostic Limited MSK Ultrasound of: Right shoulder Biceps tendon intact  normal-appearing Subscapularis tendon normal. Supraspinatus tendon is intact.  Minimal subacromial bursitis present. Calcifications present at distal tip of acromion Infraspinatus tendon normal-appearing AC joint degenerative with effusion Impression: Mild subacromial bursitis   We will request medical records including MRI shoulder from [facility].    Impression and Recommendations:    Assessment and Plan: 75 y.o. female with right shoulder pain thought to be due to subacromial bursitis with possible impingement.  Plan to request medical records from [facility] including MRI.  She is an excellent candidate for physical therapy.  Plan to refer to PT and check back in about 6 weeks.  Consider repeat injection if needed.  Last injection was a little over 2 months ago.  Right lateral hip pain due to hip abductor tendinopathy/trochanteric bursitis.  Again excellent candidate for PT.  Plan to refer to PT  Check back 6 weeks.[name]  PDMP not reviewed this encounter. Orders Placed This Encounter  Procedures   [place] LIMITED JOINT SPACE STRUCTURES UP RIGHT(NO LINKED CHARGES)    Order Specific Question:   Reason for Exam (SYMPTOM  OR DIAGNOSIS REQUIRED)    Answer:   R shoulder pain    Order Specific Question:   Preferred imaging location?    Answer:   [place]   Ambulatory referral to Physical Therapy    Referral Priority:   Routine    Referral Type:   Physical Medicine    Referral Reason:   Specialty Services Required    Requested Specialty:   Physical Therapy    Number of Visits Requested:  1   No orders of the defined types were placed in this encounter.   Discussed warning signs or symptoms. Please see discharge instructions. Patient expresses understanding.   The above documentation has been reviewed and is accurate and complete [name], M.D.

## 2021-07-20 ENCOUNTER — Encounter: Payer: Self-pay | Admitting: Family Medicine

## 2021-07-20 ENCOUNTER — Ambulatory Visit: Payer: Medicare Other | Admitting: Family Medicine

## 2021-07-20 ENCOUNTER — Other Ambulatory Visit: Payer: Self-pay

## 2021-07-20 ENCOUNTER — Ambulatory Visit: Payer: Self-pay

## 2021-07-20 ENCOUNTER — Telehealth: Payer: Self-pay | Admitting: Internal Medicine

## 2021-07-20 VITALS — BP 112/62 | HR 54 | Ht 59.0 in | Wt 130.2 lb

## 2021-07-20 DIAGNOSIS — M25511 Pain in right shoulder: Secondary | ICD-10-CM

## 2021-07-20 DIAGNOSIS — M25551 Pain in right hip: Secondary | ICD-10-CM | POA: Diagnosis not present

## 2021-07-20 DIAGNOSIS — G8929 Other chronic pain: Secondary | ICD-10-CM

## 2021-07-20 NOTE — Patient Instructions (Addendum)
Nice to meet you.  I've referred you to [facility] for PT.  Please let us know if you do not hear from them within one week regarding scheduling.  Follow-up: 6 weeks

## 2021-07-20 NOTE — Telephone Encounter (Signed)
Called pt no answer LMOM RTC.Marland KitchenFYI Dr. Alain Marion is out of the office until December 1st. If acid reflux is bothering and want a med change will need ov w/ one of the covering providers...Sharon Paul

## 2021-07-20 NOTE — Telephone Encounter (Signed)
..  Caller name:Jaydalyn Theodosia Blender  On DPR? :yes/no: Yes  Call back number:[phone]  Provider they see: Plotnikov  Reason for call: Patient is having prolbems with acid reflux and the medication that she is currently on.  She would like to discuss this with her nurse.  Please advise

## 2021-07-21 ENCOUNTER — Ambulatory Visit (INDEPENDENT_AMBULATORY_CARE_PROVIDER_SITE_OTHER): Payer: Medicare Other | Admitting: Nurse Practitioner

## 2021-07-21 ENCOUNTER — Encounter: Payer: Self-pay | Admitting: Nurse Practitioner

## 2021-07-21 ENCOUNTER — Telehealth: Payer: Self-pay | Admitting: *Deleted

## 2021-07-21 ENCOUNTER — Ambulatory Visit: Payer: Medicare Other | Admitting: Nurse Practitioner

## 2021-07-21 VITALS — BP 118/72 | HR 57 | Ht <= 58 in | Wt 129.0 lb

## 2021-07-21 DIAGNOSIS — N952 Postmenopausal atrophic vaginitis: Secondary | ICD-10-CM | POA: Diagnosis not present

## 2021-07-21 DIAGNOSIS — M8589 Other specified disorders of bone density and structure, multiple sites: Secondary | ICD-10-CM

## 2021-07-21 DIAGNOSIS — R300 Dysuria: Secondary | ICD-10-CM

## 2021-07-21 DIAGNOSIS — Z01419 Encounter for gynecological examination (general) (routine) without abnormal findings: Secondary | ICD-10-CM | POA: Diagnosis not present

## 2021-07-21 DIAGNOSIS — Z780 Asymptomatic menopausal state: Secondary | ICD-10-CM | POA: Diagnosis not present

## 2021-07-21 LAB — URINALYSIS, COMPLETE W/RFL CULTURE
Bacteria, UA: NONE SEEN /HPF
Bilirubin Urine: NEGATIVE
Casts: NONE SEEN /LPF
Crystals: NONE SEEN /HPF
Glucose, UA: NEGATIVE
Hgb urine dipstick: NEGATIVE
Hyaline Cast: NONE SEEN /LPF
Ketones, ur: NEGATIVE
Leukocyte Esterase: NEGATIVE
Nitrites, Initial: NEGATIVE
Protein, ur: NEGATIVE
RBC / HPF: NONE SEEN /HPF (ref 0–2)
Specific Gravity, Urine: 1.004 (ref 1.001–1.035)
WBC, UA: NONE SEEN /HPF (ref 0–5)
Yeast: NONE SEEN /HPF
pH: 6 (ref 5.0–8.0)

## 2021-07-21 LAB — NO CULTURE INDICATED

## 2021-07-21 MED ORDER — NONFORMULARY OR COMPOUNDED ITEM: 3 refills | Status: DC

## 2021-07-21 NOTE — Telephone Encounter (Signed)
-----   Message from Tamela Gammon, NP sent at 07/21/2021 12:24 PM EST ----- Please send in compounded vaginal estradiol cream 0.02% twice weekly to [facility]. Initially I would like her to use nightly x 2 weeks, then every other night x 2 weeks, then twice weekly.

## 2021-07-21 NOTE — Telephone Encounter (Signed)
Rx called in 

## 2021-07-21 NOTE — Progress Notes (Signed)
Sharon Paul [birth year] [id number]   History:  75 y.o. [id number] presents for breast and pelvic exam. Postmenopausal. S/P 1993 TAH for fibroid. Complains of vaginal dryness, irritation, and painful intercourse. This is not new for her and she was prescribed compounded vaginal estrogen in the past. She tried to use for a very short period and it burned so she discontinued. Normal pap and mammogram history. Having mild dysuria but thinks it may be from vaginal dryness. Being treated for bursitis, so her activity has been limited. History of HTN, PVD, IBS.   Gynecologic History No LMP recorded. Patient has had a hysterectomy.   Contraception: status post hysterectomy Sexually active: Yes  Health Maintenance Last Pap: 2012. Results were: Normal Last mammogram: 07/06/2021. Results were: Normal Last colonoscopy: 03/06/2013. Results were: Normal, 10-year recall Last Dexa: 05/07/2019. Results were: T-score - 1.9  Past medical history, past surgical history, family history and social history were all reviewed and documented in the EPIC chart. Married. 2 daughters - 68 in [place] with 2 grandchildren, 1 here with 2 grandchildren.   ROS:  A ROS was performed and pertinent positives and negatives are included.  Exam:  Vitals:   07/21/21 1149  BP: 118/72  Pulse: (!) 57  SpO2: 98%  Weight: 129 lb (58.5 kg)  Height: 4\' 9"  (1.448 m)   Body mass index is 27.92 kg/m.  General appearance:  Normal Thyroid:  Symmetrical, normal in size, without palpable masses or nodularity. Respiratory  Auscultation:  Clear without wheezing or rhonchi Cardiovascular  Auscultation:  Regular rate, without rubs, murmurs or gallops  Edema/varicosities:  Not grossly evident Abdominal  Soft,nontender, without masses, guarding or rebound.  Liver/spleen:  No organomegaly noted  Hernia:  None appreciated  Skin  Inspection:  Grossly normal Breasts: Examined lying and sitting.   Right: Without masses,  retractions, nipple discharge or axillary adenopathy.   Left: Without masses, retractions, nipple discharge or axillary adenopathy. Genitourinary   Inguinal/mons:  Normal without inguinal adenopathy  External genitalia:  Normal appearing vulva with no masses, tenderness, or lesions  BUS/Urethra/Skene's glands:  Normal  Vagina:  Normal appearing with normal color and discharge, no lesions. Atrophic changes  Cervix:  Absent  Uterus:  Absent  Adnexa/parametria:     Rt: Normal in size, without masses or tenderness.   Lt: Normal in size, without masses or tenderness.  Anus and perineum: Normal  Digital rectal exam: Declined  UA negative  Patient informed chaperone available to be present for breast and pelvic exam. Patient has requested no chaperone to be present. Patient has been advised what will be completed during breast and pelvic exam.   Assessment/Plan:  75 y.o. [id number] for breast and pelvic exam.   Well female exam with routine gynecological exam - Education provided on SBEs, importance of preventative screenings, current guidelines, high calcium diet, regular exercise, and multivitamin daily.  Labs with PCP.   Postmenopausal - Plan: DG Bone Density. No HRT. S/P 1993 TAH for fibroid.   Atrophic vaginitis - Recommend coconut oil with intercourse and even for daily hydration. She would like to retry compounded vaginal estrogen and thinks she did not use correctly or long enough. Will send to pharmacy.   Osteopenia of multiple sites - Plan: DG Bone Density. T-score - 1.9 in September 2020. Will repeat DXA now. Continue Vitamin D + Calcium supplement. She has been limited with exercise due to being treated for bursitis. Starts PT soon.   Dysuria - Plan: Urinalysis,Complete w/RFL Culture. UA unremarkable. Likely  irritation from atrophic vaginitis.   Screening for cervical cancer - Normal Pap history. No longer screening per guidelines.   Screening for breast cancer - Normal mammogram  history. She has had benign lumpectomies years ago. Continue annual screenings.  Normal breast exam today.  Screening for colon cancer - 2014 colonoscopy. Will repeat at GI's recommended interval.   Return in 2 years for breast and pelvic exam.   Tamela Gammon DNP, 12:08 PM 07/21/2021

## 2021-07-26 ENCOUNTER — Other Ambulatory Visit: Payer: Self-pay | Admitting: Internal Medicine

## 2021-08-04 ENCOUNTER — Telehealth: Payer: Self-pay

## 2021-08-04 NOTE — Progress Notes (Signed)
    Chronic Care Management Pharmacy Assistant   Name: Sharon Paul  MRN: [id number] DOB: 09/22/1945   Patient called in yesterday stating that she has not had any Repatha for this month. She stated that she called the pharmacy and was told that her prescription expired the end of November and that her provider needed to send in a new prescription for December. She stated that she called Dr. Nevin Bloodgood Ross's office but no prescription has been sent in yet. She was calling because she wanted to see if clinical pharmacist Linna Hoff would be able to help, if she could have a sample of Repatha as well since she does not know how long she can go without the Ness. Told the patient I would send message to [facility].  [place] Pharmacist Assistant [phone]

## 2021-08-04 NOTE — Chronic Care Management (AMB) (Signed)
Called [facility] as prescription was sent 01/2021 for 1 year supply of repatha - they are currently working on getting prescription filled, will need to be called back in the afternoon for status update   Order number: [id number]  Tomasa Blase, PharmD Clinical Pharmacist, [place]

## 2021-08-04 NOTE — Progress Notes (Signed)
Made a call to [facility] to ask if they were going to fill Repatha prescription. Representative stated that patient's insurance needed prior authorization for Repatha and that a Prior Auth form was sent today to the Dr. Dorris Carnes Office to be filled out and faxed.   [place] Pharmacist Assistant [phone]

## 2021-08-05 ENCOUNTER — Encounter: Payer: Self-pay | Admitting: Rehabilitative and Restorative Service Providers"

## 2021-08-05 ENCOUNTER — Other Ambulatory Visit: Payer: Self-pay

## 2021-08-05 ENCOUNTER — Ambulatory Visit (INDEPENDENT_AMBULATORY_CARE_PROVIDER_SITE_OTHER): Payer: Medicare Other | Admitting: Rehabilitative and Restorative Service Providers"

## 2021-08-05 DIAGNOSIS — G8929 Other chronic pain: Secondary | ICD-10-CM

## 2021-08-05 DIAGNOSIS — M25511 Pain in right shoulder: Secondary | ICD-10-CM | POA: Diagnosis not present

## 2021-08-05 DIAGNOSIS — R293 Abnormal posture: Secondary | ICD-10-CM

## 2021-08-05 DIAGNOSIS — R262 Difficulty in walking, not elsewhere classified: Secondary | ICD-10-CM | POA: Diagnosis not present

## 2021-08-05 DIAGNOSIS — M25551 Pain in right hip: Secondary | ICD-10-CM | POA: Diagnosis not present

## 2021-08-05 DIAGNOSIS — M25611 Stiffness of right shoulder, not elsewhere classified: Secondary | ICD-10-CM | POA: Diagnosis not present

## 2021-08-05 DIAGNOSIS — M25651 Stiffness of right hip, not elsewhere classified: Secondary | ICD-10-CM

## 2021-08-05 NOTE — Therapy (Signed)
[facility] [address] [place], [place], [address] Phone: [phone]   Fax:  [phone]  Physical Therapy Evaluation  Patient Details  Name: Sharon Paul MRN: [id number] Date of Birth: 1946/05/11 Referring Provider (PT): Gregor Hams MD   Encounter Date: 08/05/2021   PT End of Session - 08/05/21 1554     Visit Number 1    Number of Visits 12    Date for PT Re-Evaluation 09/30/21    Progress Note Due on Visit 10    PT Start Time 1435    PT Stop Time 6269    PT Time Calculation (min) 55 min    Activity Tolerance No increased pain;Patient tolerated treatment well    Behavior During Therapy [facility] for tasks assessed/performed             Past Medical History:  Diagnosis Date   Celiac sprue 2007   CVD (cardiovascular disease)    Foot fracture, left 2011   GERD (gastroesophageal reflux disease)    Hyperlipidemia    Osteopenia 04/2019   T score -1.9 FRAX 10% / 1.9%.  Stable from prior DEXA   PVD (peripheral vascular disease) (HCC)    mild carotid ? fibromuscular dysplasia   Shingles    Urinary incontinence     Past Surgical History:  Procedure Laterality Date   APPENDECTOMY     BREAST SURGERY     right breast biopsy   ESOPHAGOGASTRODUODENOSCOPY     ROTATOR CUFF REPAIR     RIGHT   ROTATOR CUFF REPAIR     Left   stress cardiolite  04-18-00   VAGINAL HYSTERECTOMY  1993   leiomyomata    There were no vitals filed for this visit.    Subjective Assessment - 08/05/21 1549     Subjective Sharon Paul has had R hip pain for "at least" a year.  She got some relief with cortisone injections (3) although shots have become less beneficial.  She has a previous R RTC repair and this shoulder has been bothering her more recently.    Pertinent History HTN, PVD, B hand OA, previous L elbow and foot fractures, B RTC repairs    Limitations Walking;Lifting;House hold activities;Standing    Patient Stated Goals Return to walking for  exercise without pain and be able to use the R shoulder without pain (including sleeping on the R side)    Currently in Pain? Yes    Pain Score 5     Pain Location Hip    Pain Orientation Right    Pain Descriptors / Indicators Aching;Sore    Pain Type Chronic pain    Pain Radiating Towards R lateral thigh    Pain Onset More than a month ago    Pain Frequency Constant    Aggravating Factors  Too much WB    Pain Relieving Factors Sit or lie down    Effect of Pain on Daily Activities Limits comfort with WB function and affects walking for exercises    Multiple Pain Sites Yes    Pain Score 4    Pain Location Shoulder    Pain Orientation Right    Pain Descriptors / Indicators Dull;Tightness    Pain Type Acute pain    Pain Radiating Towards R lateral arm above the elbow    Pain Onset More than a month ago    Pain Frequency Intermittent    Aggravating Factors  Reaching, behind the back and overhead function    Pain Relieving Factors Rest  Effect of Pain on Daily Activities Limits R UE functional use                [facility] PT Assessment - 08/05/21 0001       Assessment   Medical Diagnosis Chronic R shoulder pain and R lateral hip pain    Referring Provider (PT) Gregor Hams MD    Onset Date/Surgical Date --   ~ 1 year for R hip, chronic R shoulder (20 years)   Hand Dominance Right    Next MD Visit NA    Prior Therapy Post R & L RTC repairs      Precautions   Precautions Shoulder    Type of Shoulder Precautions Avoid impingement postures      Restrictions   Weight Bearing Restrictions No      Balance Screen   Has the patient fallen in the past 6 months No    Has the patient had a decrease in activity level because of a fear of falling?  No    Is the patient reluctant to leave their home because of a fear of falling?  No      Prior Function   Level of Independence Independent    Vocation Retired    Government social research officer for exercise, busy around the house      Cognition    Overall Cognitive Status Within Functional Limits for tasks assessed      Observation/Other Assessments   Focus on Therapeutic Outcomes (FOTO)  50 (Goal 55 in 13 visits)      Posture/Postural Control   Posture/Postural Control Postural limitations    Postural Limitations Rounded Shoulders      ROM / Strength   AROM / PROM / Strength AROM;Strength      AROM   Overall AROM  Deficits    AROM Assessment Site Shoulder;Hip    Right/Left Shoulder Left;Right    Right Shoulder Flexion 170 Degrees    Right Shoulder Internal Rotation 40 Degrees    Right Shoulder External Rotation 100 Degrees    Right Shoulder Horizontal  ADduction 30 Degrees    Left Shoulder Flexion 165 Degrees    Left Shoulder Internal Rotation 65 Degrees    Left Shoulder External Rotation 80 Degrees    Left Shoulder Horizontal ADduction 40 Degrees    Right/Left Hip Left;Right    Right Hip Flexion 95    Right Hip External Rotation  28    Right Hip Internal Rotation  9    Left Hip Flexion 95    Left Hip External Rotation  27    Left Hip Internal Rotation  10      Strength   Overall Strength Deficits    Overall Strength Comments Tight IT Bands B    Strength Assessment Site Shoulder;Hip    Right/Left Shoulder Left;Right    Right Shoulder Internal Rotation --   19.9 pounds   Right Shoulder External Rotation --   6.6 pounds   Left Shoulder Internal Rotation --   18.1 pounds   Left Shoulder External Rotation --   15.0 pounds   Right/Left Hip Left;Right    Right Hip ABduction 5/5    Left Hip ABduction 4/5      Flexibility   Soft Tissue Assessment /Muscle Length yes    Hamstrings 35 degrees B                        Objective measurements completed on examination: See above  findings.       [place] Adult PT Treatment/Exercise - 08/05/21 0001       Therapeutic Activites    Therapeutic Activities Other Therapeutic Activities    Other Therapeutic Activities Review of shoulder and hip anatomy as it  relates to impingement, bursitis, tendonitis.  Reviewed exam findings, POC and HEP.      Exercises   Exercises Knee/Hip;Shoulder      Knee/Hip Exercises: Stretches   Other Knee/Hip Stretches Figure 4 stretch 4X 20 seconds B      Knee/Hip Exercises: Sidelying   Hip ABduction Strengthening;Right;2 sets;10 reps;Limitations    Hip ABduction Limitations 3 seconds slow eccentrics      Shoulder Exercises: Supine   Other Supine Exercises Supine IR stretch at 70 degrees abduction 10X 10 seconds      Shoulder Exercises: Standing   External Rotation Strengthening;Right;10 reps;Theraband;Limitations    Theraband Level (Shoulder External Rotation) Level 2 (Red)    External Rotation Limitations 2 sets slow eccentrics                     PT Education - 08/05/21 1553     Education Details Reviewed shoulder and hip anatomy, exam findings and starter HEP    Person(s) Educated Patient    Methods Explanation;Demonstration;Tactile cues;Verbal cues;Handout    Comprehension Verbalized understanding;Tactile cues required;Need further instruction;Returned demonstration;Verbal cues required              PT Short Term Goals - 08/05/21 1602       PT SHORT TERM GOAL #1   Title Improve R shoulder IR AROM to at least 60 degrees and HA AROM to 40 degrees.    Baseline 40 and 30 degrees respectively.    Time 4    Period Weeks    Status New    Target Date 09/02/21      PT SHORT TERM GOAL #2   Title Improve B hip flexion to at least 100 degrees; ER AROM to 40 degrees and hamstrings to 50 degrees.    Baseline 95; 27-28; 35 degrees respectively    Time 4    Period Weeks    Status New    Target Date 09/02/21               PT Long Term Goals - 08/05/21 1605       PT LONG TERM GOAL #1   Title Improve FOTO to 55.    Baseline 50    Time 8    Period Weeks    Target Date 09/30/21      PT LONG TERM GOAL #2   Title Sharon Paul will report R shoulder and hip pain consistently 0-3/10 on  the Numeric Pain Rating Scale.    Baseline 4-5/10    Time 8    Period Weeks    Status New    Target Date 09/30/21      PT LONG TERM GOAL #3   Title Improve R shoulder ER strength to at least 15 pounds.    Baseline < 7 pounds    Time 8    Period Weeks    Status New    Target Date 09/30/21      PT LONG TERM GOAL #4   Title Improve R hip abductors strength to 5/5 MMT.    Baseline 4/5 MMT    Time 8    Period Weeks    Status New    Target Date 09/30/21      PT  LONG TERM GOAL #5   Title Sharon Paul will be independent with her long-term HEP at DC.    Baseline Started today    Time 8    Period Weeks    Status New    Target Date 09/30/21                    Plan - 08/05/21 1555     Clinical Impression Statement Sharon Paul has signs and symptoms consistent with R shoulder tendonitis, possible partial RTC tear and R gluteal tendonitis, possible tear.  She has a tight R shoulder posterior capsule and weak posterior RTC.  R hip weakness (abductors) and stiffness (ER) will be addressed with supervised PT.  Her prognosis to meet long-term goals is good with the recommended POC.    Personal Factors and Comorbidities Comorbidity 3+    Comorbidities HTN, PVD, B hand OA, previous L elbow and foot fractures, B RTC repairs    Examination-Activity Limitations Reach Overhead;Stairs;Stand;Bed Mobility;Bend;Sleep;Lift;Carry;Locomotion Level;Squat    Examination-Participation Restrictions Cleaning;Community Activity    Stability/Clinical Decision Making Stable/Uncomplicated    Clinical Decision Making Moderate    Rehab Potential Good    PT Frequency --   1-2X/week   PT Duration 8 weeks    PT Treatment/Interventions ADLs/Self Care Home Management;Cryotherapy;Moist Heat;Therapeutic activities;Therapeutic exercise;Neuromuscular re-education;Patient/family education;Manual techniques;Passive range of motion;Dry needling;Vasopneumatic Device    PT Next Visit Plan Review HEP, prioritize posterior  capsule stretching, scapular and ER RTC strength.  Hip abductors strength and LE flexibility PRN.    PT Home Exercise Plan Access Code: [email or web address]    Consulted and Agree with Plan of Care Patient             Patient will benefit from skilled therapeutic intervention in order to improve the following deficits and impairments:  Decreased activity tolerance, Decreased endurance, Decreased range of motion, Difficulty walking, Decreased strength, Increased edema, Impaired flexibility, Impaired perceived functional ability, Impaired UE functional use, Postural dysfunction, Pain  Visit Diagnosis: Abnormal posture  Difficulty walking  Stiffness of right shoulder, not elsewhere classified  Stiffness of right hip, not elsewhere classified  Chronic right shoulder pain  Pain in right hip     Problem List Patient Active Problem List   Diagnosis Date Noted   Statin myopathy 01/13/2021   Dysuria 12/22/2020   Low back pain 12/22/2020   Closed fracture of left elbow 08/31/2020   Dyspnea 09/08/2019   COVID-19 virus infection 09/08/2019   Acute sinus infection 11/12/2018   Shingles outbreak 09/04/2018   Hip pain 09/04/2018   LUQ pain 07/19/2018   Bloating 07/19/2018   Hand arthritis 02/21/2018   Viral URI with cough 04/25/2017   Urinary frequency 11/20/2016   Lactose intolerance in adult 09/13/2016   Cough 12/22/2014   Osteoarthritis, 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   Well adult exam 12/04/2013   Back pain, thoracic 08/13/2012   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   Urinary incontinence    Right shoulder pain 12/13/2011   Sciatica of right side 07/06/2011   Hyperglycemia 06/07/2011   Abdominal pain 04/21/2011   WEAKNESS 09/21/2010   CAROTID OCCLUSIVE DISEASE 09/07/2010   WRIST PAIN 06/25/2010   Diarrhea 10/27/2009   DIZZINESS 07/28/2009   TOBACCO USE, QUIT 07/28/2009   Pain in joint 05/26/2009   Lumbago 11/05/2008   Irritable bowel syndrome 06/27/2008   GASTRITIS, HX OF 06/27/2008   Rash and  other nonspecific skin eruption 06/02/2008   Dyslipidemia 04/23/2008   Generalized anxiety disorder 04/23/2008   DIVERTICULOSIS, COLON 04/23/2008  GERD 04/02/2008   Allergic rhinitis 12/11/2007   Vitamin D deficiency 08/15/2007   Peripheral vascular disease ([place]) 08/15/2007   Essential hypertension 03/24/2007    Farley Ly, PT, MPT 08/05/2021, 4:10 PM  [facility] Physical Therapy [address] [place], [place], [address] Phone: [phone]   Fax:  [phone]  Name: Sharon Paul MRN: [id number] Date of Birth: 04-11-1946

## 2021-08-05 NOTE — Patient Instructions (Signed)
Access Code: [email or web address] URL: [email or web address] Date: 08/05/2021 Prepared by: Vista Mink  Exercises Supine Shoulder Internal Rotation Stretch - 2-3 x daily - 7 x weekly - 1 sets - 10-20 reps - 10 seconds hold Shoulder External Rotation with Anchored Resistance with Towel Under Elbow - 2 x daily - 3 x weekly - 2 sets - 10 reps - 3 hold Supine Figure 4 Piriformis Stretch - 2-3 x daily - 7 x weekly - 1 sets - 4-5 reps - 20 seconds hold Sidelying Hip Abduction - 2 x daily - 7 x weekly - 2 sets - 10 reps - 3 seconds hold

## 2021-08-06 ENCOUNTER — Telehealth: Payer: Self-pay | Admitting: Internal Medicine

## 2021-08-06 DIAGNOSIS — E785 Hyperlipidemia, unspecified: Secondary | ICD-10-CM

## 2021-08-06 DIAGNOSIS — I739 Peripheral vascular disease, unspecified: Secondary | ICD-10-CM

## 2021-08-06 MED ORDER — REPATHA SURECLICK 140 MG/ML ~~LOC~~ SOAJ: 1.0000 "pen " | SUBCUTANEOUS | 3 refills | Status: DC

## 2021-08-06 NOTE — Telephone Encounter (Signed)
  Pt c/o medication issue:  1. Name of Medication: Evolocumab (REPATHA SURECLICK) 125 MG/ML SOAJ  2. How are you currently taking this medication (dosage and times per day)? Inject 1 pen into the skin every 14 (fourteen) days.  3. Are you having a reaction (difficulty breathing--STAT)?   4. What is your medication issue? Pt supposed to get this meds and take it on 12/01 but according to her pharmacy Dr. Harrington Challenger never respond on the refill request. She wanted to know if she get the refill today is that ok that she is 9 days late for her dose

## 2021-08-06 NOTE — Telephone Encounter (Signed)
Rx sent earlier today.

## 2021-08-06 NOTE — Telephone Encounter (Signed)
 *  STAT* If patient is at the pharmacy, call can be transferred to refill team.   1. Which medications need to be refilled? (please list name of each medication and dose if known) Evolocumab (REPATHA SURECLICK) 630 MG/ML SOAJ  2. Which pharmacy/location (including street and city if local pharmacy) is medication to be sent to?[id number] - [place], [place] - [place].  3. Do they need a 30 day or 90 day supply? 30 days  Pt supposed to take this 12/01, needs refill today

## 2021-08-11 ENCOUNTER — Telehealth: Payer: Self-pay | Admitting: Internal Medicine

## 2021-08-11 NOTE — Telephone Encounter (Signed)
Pt c/o medication issue:  1. Name of Medication: Evolocumab (REPATHA SURECLICK) 629 MG/ML SOAJ  2. How are you currently taking this medication (dosage and times per day)? AS DIRECTED  3. Are you having a reaction (difficulty breathing--STAT)? NO  4. What is your medication issue? PT NEEDS PA FOR THIS MEDICINE.    PT HAVE NOT TAKEN THIS MEDICINE SINCE 07/13/21. PT WANTS A CALLBACK SO SHE IS AWARE OF WHEN SHE SHOULD RECEIVE THE PA

## 2021-08-12 NOTE — Telephone Encounter (Signed)
LMOM PT [id number]. REPATHA SURE INJ 140MG /ML is approved through 08/28/2022. RX WAS ALREADY SENT TO PHARMACY

## 2021-08-12 NOTE — Telephone Encounter (Signed)
Please complete PA

## 2021-08-16 ENCOUNTER — Other Ambulatory Visit: Payer: Self-pay

## 2021-08-16 ENCOUNTER — Ambulatory Visit (INDEPENDENT_AMBULATORY_CARE_PROVIDER_SITE_OTHER): Payer: Medicare Other

## 2021-08-16 ENCOUNTER — Other Ambulatory Visit: Payer: Self-pay | Admitting: Nurse Practitioner

## 2021-08-16 DIAGNOSIS — M8589 Other specified disorders of bone density and structure, multiple sites: Secondary | ICD-10-CM | POA: Diagnosis not present

## 2021-08-16 DIAGNOSIS — Z780 Asymptomatic menopausal state: Secondary | ICD-10-CM

## 2021-08-17 ENCOUNTER — Telehealth: Payer: Self-pay | Admitting: Pharmacist

## 2021-08-17 NOTE — Telephone Encounter (Signed)
Pt left message concerning her Repatha. I called back and left a voicemail on both her home and cell #s.

## 2021-08-19 NOTE — Telephone Encounter (Signed)
Pt left another message. I called her back again and call went straight to voicemail. I have left another message.

## 2021-08-20 ENCOUNTER — Telehealth: Payer: Self-pay | Admitting: Internal Medicine

## 2021-08-20 NOTE — Telephone Encounter (Signed)
Saralyn Pilar with [employer] called to confirm patient's address and insurance information. I confirmed home address on file for patient and transferred to billing to discuss insurance.

## 2021-08-20 NOTE — Telephone Encounter (Signed)
Pt called clinic. Frustrated that she has been off of her Repatha the whole month of December because her pharmacy would not fill it and told her a prior authorization was needed. I advised her we already have a prior authorization on file and everything is fine from our end. [facility] called [facility] who also tried to tell me a PA was needed. When I advised them it's not and we already have one on file, they were magically able to process the rx. Pt is aware issue was with [facility] and they have apparently fixed it. They also do not have Repatha in stock and will not have it in until next Tuesday.

## 2021-08-24 ENCOUNTER — Encounter: Payer: Medicare Other | Admitting: Rehabilitative and Restorative Service Providers"

## 2021-08-26 ENCOUNTER — Encounter: Payer: Self-pay | Admitting: Rehabilitative and Restorative Service Providers"

## 2021-08-26 ENCOUNTER — Ambulatory Visit: Payer: Medicare Other | Admitting: Rehabilitative and Restorative Service Providers"

## 2021-08-26 ENCOUNTER — Other Ambulatory Visit: Payer: Self-pay

## 2021-08-26 DIAGNOSIS — M25611 Stiffness of right shoulder, not elsewhere classified: Secondary | ICD-10-CM

## 2021-08-26 DIAGNOSIS — M25551 Pain in right hip: Secondary | ICD-10-CM

## 2021-08-26 DIAGNOSIS — R262 Difficulty in walking, not elsewhere classified: Secondary | ICD-10-CM

## 2021-08-26 DIAGNOSIS — M25651 Stiffness of right hip, not elsewhere classified: Secondary | ICD-10-CM

## 2021-08-26 DIAGNOSIS — R293 Abnormal posture: Secondary | ICD-10-CM

## 2021-08-26 DIAGNOSIS — G8929 Other chronic pain: Secondary | ICD-10-CM | POA: Diagnosis not present

## 2021-08-26 DIAGNOSIS — M25511 Pain in right shoulder: Secondary | ICD-10-CM | POA: Diagnosis not present

## 2021-08-26 NOTE — Patient Instructions (Addendum)
Access Code: [email or web address] URL: [email or web address] Date: 08/26/2021 Prepared by: Scot Jun  Exercises Supine Shoulder Internal Rotation Stretch - 2-3 x daily - 7 x weekly - 1 sets - 10-20 reps - 10 seconds hold Shoulder External Rotation with Anchored Resistance with Towel Under Elbow - 2 x daily - 3 x weekly - 2 sets - 10 reps - 3 hold Supine Figure 4 Piriformis Stretch - 2-3 x daily - 7 x weekly - 1 sets - 4-5 reps - 20 seconds hold Sidelying Hip Abduction - 2 x daily - 7 x weekly - 2 sets - 10 reps - 3 seconds hold Standing Shoulder Row with Anchored Resistance - 2 x daily - 7 x weekly - 10 reps - 3 sets Shoulder Extension with Resistance - 2 x daily - 7 x weekly - 10 reps - 3 sets   Dry needling handout

## 2021-08-26 NOTE — Therapy (Signed)
[facility] Physical Therapy [address] [place], [place], [address] Phone: [phone]   Fax:  [phone]  Physical Therapy Treatment  Patient Details  Name: Sharon Paul MRN: [id number] Date of Birth: 06/01/1946 Referring Provider (PT): Gregor Hams MD   Encounter Date: 08/26/2021   PT End of Session - 08/26/21 1133     Visit Number 2    Number of Visits 12    Date for PT Re-Evaluation 09/30/21    Progress Note Due on Visit 10    PT Start Time 1100    PT Stop Time 1140    PT Time Calculation (min) 40 min    Activity Tolerance Patient tolerated treatment well    Behavior During Therapy [facility] for tasks assessed/performed             Past Medical History:  Diagnosis Date   Celiac sprue 2007   CVD (cardiovascular disease)    Foot fracture, left 2011   GERD (gastroesophageal reflux disease)    Hyperlipidemia    Osteopenia 04/2019   T score -1.9 FRAX 10% / 1.9%.  Stable from prior DEXA   PVD (peripheral vascular disease) (HCC)    mild carotid ? fibromuscular dysplasia   Shingles    Urinary incontinence     Past Surgical History:  Procedure Laterality Date   APPENDECTOMY     BREAST SURGERY     right breast biopsy   ESOPHAGOGASTRODUODENOSCOPY     ROTATOR CUFF REPAIR     RIGHT   ROTATOR CUFF REPAIR     Left   stress cardiolite  04-18-00   VAGINAL HYSTERECTOMY  1993   leiomyomata    There were no vitals filed for this visit.   Subjective Assessment - 08/26/21 1104     Subjective Pt. indicated waking up this morning c increased Rt shoulder c insidious worsening.  Pt. indicated feeling some improvement in week before the last 24 hrs.  Pt. indicated she did some exercise but not every day.  Pt. indicated hip complaints today sore but was some better as well.    Pertinent History HTN, PVD, B hand OA, previous Lt elbow and foot fractures, bilateral RTC repairs    Limitations Walking;Lifting;House hold activities;Standing    Patient  Stated Goals Return to walking for exercise without pain and be able to use the Rt shoulder without pain (including sleeping on the Rt side)    Currently in Pain? Yes    Pain Score 5     Pain Location Hip    Pain Orientation Right    Pain Descriptors / Indicators Sore    Pain Type Chronic pain    Pain Onset More than a month ago    Pain Frequency Constant    Aggravating Factors  lying on Rt side    Pain Relieving Factors exercise helps    Pain Score 7    Pain Location Shoulder    Pain Orientation Right    Pain Descriptors / Indicators Sore    Pain Type Acute pain    Pain Onset More than a month ago    Aggravating Factors  insidous worsened this morning.  lifting hurts today    Pain Relieving Factors tylenol                [facility] PT Assessment - 08/26/21 0001       Assessment   Medical Diagnosis Chronic Rt shoulder pain and Rt lateral hip pain    Referring Provider (PT) Rebekah Chesterfield  Georgina Snell MD      AROM   Right Shoulder Flexion 165 Degrees    Right Shoulder Internal Rotation 65 Degrees   in 90 deg abduction in supine   Right Shoulder External Rotation 100 Degrees   in supine 90 deg abduction, pain in end range     Palpation   Palpation comment Trigger points and concordant referred Rt shoulder symptoms from Rt infraspinatus                           [facility] Adult PT Treatment/Exercise - 08/26/21 0001       Exercises   Exercises Other Exercises    Other Exercises  Verbal review of existing HEP.      Knee/Hip Exercises: Sidelying   Hip ABduction Right;2 sets;10 reps      Shoulder Exercises: Sidelying   External Rotation Right   2 x 10     Shoulder Exercises: Standing   External Rotation Limitations held band use in clinic due to DN performance, cues for continued use at home    Extension 20 reps;Both    Theraband Level (Shoulder Extension) Level 3 (Green)    Row 20 reps;Both    Theraband Level (Shoulder Row) Level 3 (Green)      Modalities    Modalities Moist Heat      Moist Heat Therapy   Number Minutes Moist Heat 5 Minutes   during ther ex review education   Moist Heat Location Shoulder   Rt     Manual Therapy   Manual therapy comments Rt infraspinatus trigger point compression, g2 inferior jt mobs              Trigger Point Dry Needling - 08/26/21 0001     Consent Given? Yes    Education Handout Provided Yes    Muscles Treated Upper Quadrant Infraspinatus   Rt   Infraspinatus Response Twitch response elicited                   PT Education - 08/26/21 1140     Education Details HEP progression, DN information    Person(s) Educated Patient    Methods Explanation;Demonstration;Verbal cues;Handout    Comprehension Verbalized understanding;Returned demonstration              PT Short Term Goals - 08/26/21 1108       PT SHORT TERM GOAL #1   Title Improve Rt shoulder IR AROM to at least 60 degrees and horizontal adduction AROM to 40 degrees.    Time 4    Period Weeks    Status On-going    Target Date 09/02/21      PT SHORT TERM GOAL #2   Title Improve bilateral  hip flexion to at least 100 degrees; ER AROM to 40 degrees and hamstrings to 50 degrees.    Time 4    Period Weeks    Status On-going    Target Date 09/02/21               PT Long Term Goals - 08/26/21 1107       PT LONG TERM GOAL #1   Title Improve FOTO to 55.    Time 8    Period Weeks    Status On-going    Target Date 09/30/21      PT LONG TERM GOAL #2   Title Sharon Paul will report Rt shoulder and hip pain consistently 0-3/10 on the Numeric Pain  Rating Scale.    Baseline 4-5/10    Time 8    Period Weeks    Status On-going    Target Date 09/30/21      PT LONG TERM GOAL #3   Title Improve Rt shoulder ER strength to at least 15 pounds.    Baseline < 7 pounds    Time 8    Period Weeks    Status On-going    Target Date 09/30/21      PT LONG TERM GOAL #4   Title Improve Rt hip abductors strength to 5/5 MMT.     Baseline 4/5 MMT    Time 8    Period Weeks    Status On-going    Target Date 09/30/21      PT LONG TERM GOAL #5   Title Sharon Paul will be independent with her long-term HEP at DC.    Baseline Started today    Time 8    Period Weeks    Status On-going    Target Date 09/30/21                   Plan - 08/26/21 1130     Clinical Impression Statement Very strong concordant referred symptoms from Rt infraspinatus to Rt shoulder complaints.  Immediate improvement in pain complaints for Rt shoulder following manual and trigger point dry needling as noted in resting symptoms and in flexion/abduction shoulder movement.  Pt. was instructed to provide result analysis upon return next visit and express whether she desired further DN treatment.  Given history of improvement from DN to Rt hip in past, it is possible to be helpful to include it as well.    Personal Factors and Comorbidities Comorbidity 3+    Comorbidities HTN, PVD, B hand OA, previous L elbow and foot fractures, B RTC repairs    Examination-Activity Limitations Reach Overhead;Stairs;Stand;Bed Mobility;Bend;Sleep;Lift;Carry;Locomotion Level;Squat    Examination-Participation Restrictions Cleaning;Community Activity    Stability/Clinical Decision Making Stable/Uncomplicated    Rehab Potential Good    PT Frequency --   1-2X/week   PT Duration 8 weeks    PT Treatment/Interventions ADLs/Self Care Home Management;Cryotherapy;Moist Heat;Therapeutic activities;Therapeutic exercise;Neuromuscular re-education;Patient/family education;Manual techniques;Passive range of motion;Dry needling;Vasopneumatic Device    PT Next Visit Plan DN if desired by Pt. for Rt shoulder and Rt hip.  Progressive strengthening improvements for both    PT Home Exercise Plan Access Code: [email or web address]    Consulted and Agree with Plan of Care Patient             Patient will benefit from skilled therapeutic intervention in order to improve the following deficits  and impairments:  Decreased activity tolerance, Decreased endurance, Decreased range of motion, Difficulty walking, Decreased strength, Increased edema, Impaired flexibility, Impaired perceived functional ability, Impaired UE functional use, Postural dysfunction, Pain  Visit Diagnosis: Abnormal posture  Difficulty walking  Stiffness of right shoulder, not elsewhere classified  Stiffness of right hip, not elsewhere classified  Chronic right shoulder pain  Pain in right hip     Problem List Patient Active Problem List   Diagnosis Date Noted   Statin myopathy 01/13/2021   Dysuria 12/22/2020   Low back pain 12/22/2020   Closed fracture of left elbow 08/31/2020   Dyspnea 09/08/2019   COVID-19 virus infection 09/08/2019   Acute sinus infection 11/12/2018   Shingles outbreak 09/04/2018   Hip pain 09/04/2018   LUQ pain 07/19/2018   Bloating 07/19/2018   Hand arthritis 02/21/2018   Viral URI with cough  04/25/2017   Urinary frequency 11/20/2016   Lactose intolerance in adult 09/13/2016   Cough 12/22/2014   Osteoarthritis, 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   Well adult exam 12/04/2013   Back pain, thoracic 08/13/2012   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   Urinary incontinence    Right shoulder pain 12/13/2011   Sciatica of right side 07/06/2011   Hyperglycemia 06/07/2011   Abdominal pain 04/21/2011   WEAKNESS 09/21/2010   CAROTID OCCLUSIVE DISEASE 09/07/2010   WRIST PAIN 06/25/2010   Diarrhea 10/27/2009   DIZZINESS 07/28/2009   TOBACCO USE, QUIT 07/28/2009   Pain in joint 05/26/2009   Lumbago 11/05/2008   Irritable bowel syndrome 06/27/2008   GASTRITIS, HX OF 06/27/2008   Rash and other nonspecific skin eruption 06/02/2008   Dyslipidemia 04/23/2008   Generalized anxiety disorder 04/23/2008   DIVERTICULOSIS, COLON 04/23/2008   GERD 04/02/2008   Allergic rhinitis 12/11/2007   Vitamin D deficiency 08/15/2007   Peripheral vascular disease ([place]) 08/15/2007   Essential hypertension 03/24/2007     Scot Jun, PT, DPT, OCS, ATC 08/26/21  11:41 AM    [facility] Physical Therapy [address] [place], [place], [address] Phone: [phone]   Fax:  [phone]  Name: Sharon Paul MRN: [id number] Date of Birth: 10-May-1946

## 2021-09-01 ENCOUNTER — Ambulatory Visit: Payer: Self-pay

## 2021-09-01 ENCOUNTER — Other Ambulatory Visit: Payer: Self-pay

## 2021-09-01 ENCOUNTER — Ambulatory Visit: Payer: Medicare Other | Admitting: Family Medicine

## 2021-09-01 VITALS — BP 150/70 | Ht <= 58 in | Wt 130.0 lb

## 2021-09-01 DIAGNOSIS — M25511 Pain in right shoulder: Secondary | ICD-10-CM | POA: Diagnosis not present

## 2021-09-01 DIAGNOSIS — G8929 Other chronic pain: Secondary | ICD-10-CM

## 2021-09-01 NOTE — Progress Notes (Signed)
° °  I, [name], LAT, ATC acting as a scribe for Sharon Leader, MD.  Sharon Paul is a 76 y.o. female who presents to [place] at [facility] today for f/u R shoulder pain due to mild subacromial bursitis with possible impingement and R lateral hip pain due to hip abductor tendinopathy/trochanteric bursitis. Pt was last seen by Dr. Georgina Snell on 07/20/21 and was referred to PT, completing 2 visits. Today, pt reports that she does not feel much better, in fact her right shoulder feels worse and her hip is about the same as the last visit.  Dx imaging: prior R shoulder XR and MRI completed at [facility]  Pertinent review of systems: No fevers or chills  Relevant historical information: Did receive medical records from [facility].  Patient had MRI September 2022 showing tendinitis without visible tear.  Patient had subacromial injection October 2022.   Exam:  BP (!) 150/70    Ht 4\' 9"  (1.448 m)    Wt 130 lb (59 kg)    BMI 28.13 kg/m  General: Well Developed, well nourished, and in no acute distress.   MSK: Right shoulder mature scar deltoid region.  Pain with abduction.  Intact strength.    Lab and Radiology Results  Procedure: Real-time Ultrasound Guided Injection of shoulder subacromial bursa Device: Philips Affiniti 50G Images permanently stored and available for review in PACS Verbal informed consent obtained.  Discussed risks and benefits of procedure. Warned about infection bleeding damage to structures skin hypopigmentation and fat atrophy among others. Patient expresses understanding and agreement Time-out conducted.   Noted no overlying erythema, induration, or other signs of local infection.   Skin prepped in a sterile fashion.   Local anesthesia: Topical Ethyl chloride.   With sterile technique and under real time ultrasound guidance: 40 mg of Kenalog and 2 mL of Marcaine injected into the subacromial space. Fluid seen entering the  bursa Completed without difficulty   Pain immediately resolved suggesting accurate placement of the medication.   Advised to call if fevers/chills, erythema, induration, drainage, or persistent bleeding.   Images permanently stored and available for review in the ultrasound unit.  Impression: Technically successful ultrasound guided injection.         Assessment and Plan: 76 y.o. female with right shoulder pain thought to be due to rotator cuff tendinopathy and bursitis.  Plan for subacromial injection today.  Last injection was about 3 months ago.  Plan to continue physical therapy and reassess in a month.   PDMP not reviewed this encounter. Orders Placed This Encounter  Procedures   [place] LIMITED JOINT SPACE STRUCTURES UP RIGHT(NO LINKED CHARGES)    Standing Status:   Future    Number of Occurrences:   1    Standing Expiration Date:   03/01/2022    Order Specific Question:   Reason for Exam (SYMPTOM  OR DIAGNOSIS REQUIRED)    Answer:   right shoulder pain    Order Specific Question:   Preferred imaging location?    Answer:   [facility]   No orders of the defined types were placed in this encounter.    Discussed warning signs or symptoms. Please see discharge instructions. Patient expresses understanding.   The above documentation has been reviewed and is accurate and complete Sharon Paul, M.D.

## 2021-09-01 NOTE — Patient Instructions (Addendum)
Thank you for coming in today.   You received a steroid injection today. Seek immediate medical attention if the joint becomes red, extremely painful, or is oozing fluid.   Continue physical therapy  Recheck back in 1 month

## 2021-09-02 ENCOUNTER — Ambulatory Visit (INDEPENDENT_AMBULATORY_CARE_PROVIDER_SITE_OTHER): Payer: Medicare Other | Admitting: Rehabilitative and Restorative Service Providers"

## 2021-09-02 ENCOUNTER — Encounter: Payer: Self-pay | Admitting: Rehabilitative and Restorative Service Providers"

## 2021-09-02 DIAGNOSIS — M25511 Pain in right shoulder: Secondary | ICD-10-CM | POA: Diagnosis not present

## 2021-09-02 DIAGNOSIS — M25651 Stiffness of right hip, not elsewhere classified: Secondary | ICD-10-CM | POA: Diagnosis not present

## 2021-09-02 DIAGNOSIS — R293 Abnormal posture: Secondary | ICD-10-CM

## 2021-09-02 DIAGNOSIS — M25611 Stiffness of right shoulder, not elsewhere classified: Secondary | ICD-10-CM | POA: Diagnosis not present

## 2021-09-02 DIAGNOSIS — G8929 Other chronic pain: Secondary | ICD-10-CM

## 2021-09-02 DIAGNOSIS — M25551 Pain in right hip: Secondary | ICD-10-CM

## 2021-09-02 DIAGNOSIS — R262 Difficulty in walking, not elsewhere classified: Secondary | ICD-10-CM

## 2021-09-02 NOTE — Therapy (Signed)
[facility] Physical Therapy [address] [place], [place], [address] Phone: [phone]   Fax:  [phone]  Physical Therapy Treatment  Patient Details  Name: Sharon Paul MRN: [id number] Date of Birth: [birth year] Referring Provider (PT): Gregor Hams MD  Progress Note Reporting Period 08/05/2021 to 09/02/2021  See note below for Objective Data and Assessment of Progress/Goals.     Encounter Date: 09/02/2021   PT End of Session - 09/02/21 1627     Visit Number 3    Number of Visits 12    Date for PT Re-Evaluation 09/30/21    Progress Note Due on Visit 10    PT Start Time 2947    PT Stop Time 1515    PT Time Calculation (min) 26 min    Activity Tolerance Patient tolerated treatment well;No increased pain    Behavior During Therapy [facility] for tasks assessed/performed             Past Medical History:  Diagnosis Date   Celiac sprue 2007   CVD (cardiovascular disease)    Foot fracture, left 2011   GERD (gastroesophageal reflux disease)    Hyperlipidemia    Osteopenia 04/2019   T score -1.9 FRAX 10% / 1.9%.  Stable from prior DEXA   PVD (peripheral vascular disease) (HCC)    mild carotid ? fibromuscular dysplasia   Shingles    Urinary incontinence     Past Surgical History:  Procedure Laterality Date   APPENDECTOMY     BREAST SURGERY     right breast biopsy   ESOPHAGOGASTRODUODENOSCOPY     ROTATOR CUFF REPAIR     RIGHT   ROTATOR CUFF REPAIR     Left   stress cardiolite  04-18-00   VAGINAL HYSTERECTOMY  1993   leiomyomata    There were no vitals filed for this visit.   Subjective Assessment - 09/02/21 1621     Subjective Sharon Paul notes overall progress with her hip and shoulder in just 2 visits (today is visit 3).  Her shoulder is still limiting function and her hip has not been limiting function this week.    Pertinent History HTN, PVD, B hand OA, previous Lt elbow and foot fractures, bilateral RTC repairs    Limitations  Walking;Lifting;House hold activities;Standing    Patient Stated Goals Return to walking for exercise without pain and be able to use the Rt shoulder without pain (including sleeping on the Rt side)    Currently in Pain? No/denies    Pain Score 0-No pain    Pain Location Hip    Pain Orientation Right    Pain Onset More than a month ago    Pain Frequency Rarely    Pain Score 6    Pain Location Shoulder    Pain Orientation Right    Pain Descriptors / Indicators Sore    Pain Type Acute pain    Pain Radiating Towards R lateral arm    Pain Onset More than a month ago    Pain Frequency Intermittent    Aggravating Factors  Impingement postures, lifting away from the body    Pain Relieving Factors Tylenol    Effect of Pain on Daily Activities Limits R UE use with ADLs                [facility] PT Assessment - 09/02/21 0001       Observation/Other Assessments   Focus on Therapeutic Outcomes (FOTO)  51in 3 visits (Goal 55 in 13 visits)  ROM / Strength   AROM / PROM / Strength AROM;Strength      AROM   Overall AROM  Deficits    AROM Assessment Site Shoulder    Right/Left Shoulder Right    Right Shoulder Flexion 170 Degrees    Right Shoulder Internal Rotation 80 Degrees    Right Shoulder External Rotation 100 Degrees    Right Shoulder Horizontal  ADduction 30 Degrees      Strength   Overall Strength Deficits    Strength Assessment Site Shoulder    Right/Left Shoulder Right    Right Shoulder Internal Rotation --   22.4 pounds   Right Shoulder External Rotation --   14.2 pounds                          [facility] Adult PT Treatment/Exercise - 09/02/21 0001       Posture/Postural Control   Posture/Postural Control Postural limitations    Postural Limitations Rounded Shoulders      Exercises   Exercises Knee/Hip;Shoulder      Knee/Hip Exercises: Stretches   Other Knee/Hip Stretches Figure 4 stretch 4X 20 seconds B      Knee/Hip Exercises: Sidelying   Hip  ABduction Strengthening;Right;2 sets;10 reps;Limitations    Hip ABduction Limitations 3 seconds slow eccentrics      Shoulder Exercises: Supine   Other Supine Exercises Supine IR stretch at 70 degrees abduction 5X 10 seconds      Shoulder Exercises: Seated   Other Seated Exercises Posterior capsule stretch 5X 10 seconds      Shoulder Exercises: Standing   External Rotation Strengthening;Right;10 reps;Theraband;Limitations    Theraband Level (Shoulder External Rotation) Level 2 (Red)    External Rotation Limitations 2 sets slow eccentrics                     PT Education - 09/02/21 1624     Education Details Reviewed exam findings and updated HEP.  Corrected day 1 HEP.    Person(s) Educated Patient    Methods Explanation;Demonstration;Tactile cues;Verbal cues;Handout    Comprehension Verbalized understanding;Tactile cues required;Need further instruction;Returned demonstration;Verbal cues required              PT Short Term Goals - 09/02/21 1626       PT SHORT TERM GOAL #1   Title Improve Rt shoulder IR AROM to at least 60 degrees and horizontal adduction AROM to 40 degrees.    Baseline Met IR goal, HA 30 degrees    Time 2    Period Weeks    Status Partially Met    Target Date 09/16/21      PT SHORT TERM GOAL #2   Title Improve bilateral  hip flexion to at least 100 degrees; ER AROM to 40 degrees and hamstrings to 50 degrees.    Baseline 95; 27-28; 35 degrees respectively    Time 2    Period Weeks    Status On-going    Target Date 09/16/21               PT Long Term Goals - 09/02/21 1626       PT LONG TERM GOAL #1   Title Improve FOTO to 55.    Baseline 51 (was 50)    Time 8    Period Weeks    Status On-going    Target Date 09/30/21      PT LONG TERM GOAL #2   Title Sharon Paul  will report Rt shoulder and hip pain consistently 0-3/10 on the Numeric Pain Rating Scale.    Baseline Hip met, shoulder can still be 4/10    Time 8    Period Weeks     Status Partially Met    Target Date 09/30/21      PT LONG TERM GOAL #3   Title Improve Rt shoulder ER strength to at least 15 pounds.    Baseline 14 pounds (was < 7 pounds)    Time 8    Period Weeks    Status On-going    Target Date 09/30/21      PT LONG TERM GOAL #4   Title Improve Rt hip abductors strength to 5/5 MMT.    Baseline 4/5 MMT    Time 8    Period Weeks    Status On-going    Target Date 09/30/21      PT LONG TERM GOAL #5   Title Sharon Paul will be independent with her long-term HEP at DC.    Baseline Updated today    Time 8    Period Weeks    Status On-going    Target Date 09/30/21                   Plan - 09/02/21 1628     Clinical Impression Statement Sharon Paul is making early subjective, objective and functional progress towards long-term goals.  Today was just her 3rd visit and already significant shoulder AROM and strength progress is noted.  She did require corrective feedback with her HEP and correct practice should help her reach LTGs more rapidly.    Personal Factors and Comorbidities Comorbidity 3+    Comorbidities HTN, PVD, B hand OA, previous L elbow and foot fractures, B RTC repairs    Examination-Activity Limitations Reach Overhead;Stairs;Stand;Bed Mobility;Bend;Sleep;Lift;Carry;Locomotion Level;Squat    Examination-Participation Restrictions Cleaning;Community Activity    Stability/Clinical Decision Making Stable/Uncomplicated    Rehab Potential Good    PT Frequency --   1-2X/week   PT Duration 4 weeks    PT Treatment/Interventions ADLs/Self Care Home Management;Cryotherapy;Moist Heat;Therapeutic activities;Therapeutic exercise;Neuromuscular re-education;Patient/family education;Manual techniques;Passive range of motion;Dry needling;Vasopneumatic Device    PT Next Visit Plan Progress shoulder (RTC and scapular) strength, check objective hip progress (Sharon Paul was 18 minutes late today)    PT Home Exercise Plan Access Code: [email or web address]    Consulted and  Agree with Plan of Care Patient             Patient will benefit from skilled therapeutic intervention in order to improve the following deficits and impairments:  Decreased activity tolerance, Decreased endurance, Decreased range of motion, Difficulty walking, Decreased strength, Increased edema, Impaired flexibility, Impaired perceived functional ability, Impaired UE functional use, Postural dysfunction, Pain  Visit Diagnosis: Abnormal posture  Difficulty walking  Stiffness of right shoulder, not elsewhere classified  Stiffness of right hip, not elsewhere classified  Chronic right shoulder pain  Pain in right hip     Problem List Patient Active Problem List   Diagnosis Date Noted   Statin myopathy 01/13/2021   Dysuria 12/22/2020   Low back pain 12/22/2020   Closed fracture of left elbow 08/31/2020   Dyspnea 09/08/2019   COVID-19 virus infection 09/08/2019   Acute sinus infection 11/12/2018   Shingles outbreak 09/04/2018   Hip pain 09/04/2018   LUQ pain 07/19/2018   Bloating 07/19/2018   Hand arthritis 02/21/2018   Viral URI with cough 04/25/2017   Urinary frequency 11/20/2016   Lactose intolerance in  adult 09/13/2016   Cough 12/22/2014   Osteoarthritis, 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   Well adult exam 12/04/2013   Back pain, thoracic 08/13/2012   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   Urinary incontinence    Right shoulder pain 12/13/2011   Sciatica of right side 07/06/2011   Hyperglycemia 06/07/2011   Abdominal pain 04/21/2011   WEAKNESS 09/21/2010   CAROTID OCCLUSIVE DISEASE 09/07/2010   WRIST PAIN 06/25/2010   Diarrhea 10/27/2009   DIZZINESS 07/28/2009   TOBACCO USE, QUIT 07/28/2009   Pain in joint 05/26/2009   Lumbago 11/05/2008   Irritable bowel syndrome 06/27/2008   GASTRITIS, HX OF 06/27/2008   Rash and other nonspecific skin eruption 06/02/2008   Dyslipidemia 04/23/2008   Generalized anxiety disorder 04/23/2008   DIVERTICULOSIS, COLON 04/23/2008   GERD  04/02/2008   Allergic rhinitis 12/11/2007   Vitamin D deficiency 08/15/2007   Peripheral vascular disease ([place]) 08/15/2007   Essential hypertension 03/24/2007    Farley Ly, PT, MPT 09/02/2021, 4:31 PM  [facility] Physical Therapy [address] [place], [place], [address] Phone: [phone]   Fax:  [phone]  Name: Sharon Paul MRN: [id number] Date of Birth: 1946-03-29

## 2021-09-02 NOTE — Patient Instructions (Signed)
Access Code: [email or web address] URL: [email or web address] Date: 09/02/2021 Prepared by: Vista Mink  Exercises Supine Shoulder Internal Rotation Stretch - 1 x daily - 7 x weekly - 1 sets - 10 reps - 10 seconds hold Shoulder External Rotation with Anchored Resistance with Towel Under Elbow - 2 x daily - 3 x weekly - 2 sets - 10 reps - 3 hold Supine Figure 4 Piriformis Stretch - 2-3 x daily - 7 x weekly - 1 sets - 4-5 reps - 20 seconds hold Sidelying Hip Abduction - 2 x daily - 7 x weekly - 2 sets - 10 reps - 3 seconds hold Standing Shoulder Row with Anchored Resistance - 2 x daily - 7 x weekly - 3 sets - 10 reps Shoulder Extension with Resistance - 2 x daily - 7 x weekly - 3 sets - 10 reps Standing Shoulder Posterior Capsule Stretch - 2-3 x daily - 7 x weekly - 1 sets - 5 reps - 10 seconds hold

## 2021-09-07 ENCOUNTER — Telehealth: Payer: Self-pay | Admitting: Family Medicine

## 2021-09-07 NOTE — Telephone Encounter (Signed)
Received right shoulder MRI report from [facility] dated May 17, 2021. Impression:  1.Status post right rotator cuff repair showing severe thinning of the posterior supraspinatus and anterior infraspinatus with an irregular high-grade articular sided partial tear with probable small full-thickness communications.  This involves the segment estimated at 13 mm AP and 33 mm medial to lateral.  Mild streak-like fatty infiltrate of the supraspinatus and infraspinatus muscles. 2.  Postoperative contour changes of the acromion and AC joint with no impingement factors.   MRI will be sent to scan

## 2021-09-08 ENCOUNTER — Ambulatory Visit: Payer: Medicare Other | Admitting: Obstetrics & Gynecology

## 2021-09-08 ENCOUNTER — Other Ambulatory Visit: Payer: Self-pay

## 2021-09-08 ENCOUNTER — Encounter: Payer: Medicare Other | Admitting: Rehabilitative and Restorative Service Providers"

## 2021-09-08 ENCOUNTER — Encounter: Payer: Self-pay | Admitting: Obstetrics & Gynecology

## 2021-09-08 VITALS — BP 132/82 | HR 62 | Resp 20

## 2021-09-08 DIAGNOSIS — R300 Dysuria: Secondary | ICD-10-CM | POA: Diagnosis not present

## 2021-09-08 LAB — WET PREP FOR TRICH, YEAST, CLUE

## 2021-09-08 MED ORDER — SULFAMETHOXAZOLE-TRIMETHOPRIM 800-160 MG PO TABS: 1.0000 | ORAL_TABLET | Freq: Two times a day (BID) | ORAL | 0 refills | Status: AC

## 2021-09-08 NOTE — Progress Notes (Signed)
° ° °  [place] 1946-08-22 [id number]        75 y.o.  [id number]   RP: Pain/burning with urination x 4 days  HPI: Pain/burning with urination x 4 days.  Took AZO last night and symptoms are a little better today.  No vaginal discharge.  No vaginal or vulvar itching.  No fever.   OB History  Gravida Para Term Preterm AB Living  3 2 2   1 2   SAB IAB Ectopic Multiple Live Births               # Outcome Date GA Lbr Len/2nd Weight Sex Delivery Anes PTL Lv  3 AB           2 Term           1 Term             Past medical history,surgical history, problem list, medications, allergies, family history and social history were all reviewed and documented in the EPIC chart.   Directed ROS with pertinent positives and negatives documented in the history of present illness/assessment and plan.  Exam:  Vitals:   09/08/21 1559  BP: 132/82  Pulse: 62  Resp: 20   General appearance:  Normal  CVAT Neg bilaterally  Abdomen: Soft, NT  Gynecologic exam: Vulva normal.  Wet prep done in the vagina.  U/A: Yellow clear, Nit Neg, Pro Neg, WBC 6-10, RBC Neg, Bacteria Few.  U. Culture pending. Wet prep: Neg   Assessment/Plan:  76 y.o. [id number]   1. Dysuria Pain/burning with urination x 4 days.  Took AZO last night and symptoms are a little better today.  No vaginal discharge.  No vaginal or vulvar itching.  No fever.  U/A mildly perturbed.  U. Culture pending.  Will start on Bactrim DS BID x 3 days  Usage reviewed and prescription sent to pharmacy.  Wet prep Neg. - Urinalysis,Complete w/RFL Culture; Future - Urinalysis,Complete w/RFL Culture - WET PREP FOR TRICH, YEAST, CLUE   Princess Bruins MD, 4:15 PM 09/08/2021

## 2021-09-09 ENCOUNTER — Ambulatory Visit: Payer: Medicare Other | Admitting: Nurse Practitioner

## 2021-09-10 ENCOUNTER — Other Ambulatory Visit: Payer: Self-pay

## 2021-09-10 ENCOUNTER — Encounter: Payer: Self-pay | Admitting: Rehabilitative and Restorative Service Providers"

## 2021-09-10 ENCOUNTER — Ambulatory Visit: Payer: Medicare Other | Admitting: Rehabilitative and Restorative Service Providers"

## 2021-09-10 DIAGNOSIS — M25511 Pain in right shoulder: Secondary | ICD-10-CM | POA: Diagnosis not present

## 2021-09-10 DIAGNOSIS — R293 Abnormal posture: Secondary | ICD-10-CM | POA: Diagnosis not present

## 2021-09-10 DIAGNOSIS — M25611 Stiffness of right shoulder, not elsewhere classified: Secondary | ICD-10-CM

## 2021-09-10 DIAGNOSIS — M25651 Stiffness of right hip, not elsewhere classified: Secondary | ICD-10-CM | POA: Diagnosis not present

## 2021-09-10 DIAGNOSIS — G8929 Other chronic pain: Secondary | ICD-10-CM | POA: Diagnosis not present

## 2021-09-10 DIAGNOSIS — R262 Difficulty in walking, not elsewhere classified: Secondary | ICD-10-CM

## 2021-09-10 DIAGNOSIS — M25551 Pain in right hip: Secondary | ICD-10-CM | POA: Diagnosis not present

## 2021-09-10 NOTE — Therapy (Signed)
[facility] [address] [place], [place], [address] Phone: [phone]   Fax:  [phone]  Physical Therapy Treatment  Patient Details  Name: [place] MRN: [id number] Date of Birth: 04-22-1946 Referring Provider (PT): Gregor Hams MD   Encounter Date: 09/10/2021   PT End of Session - 09/10/21 1152     Visit Number 4    Number of Visits 12    Date for PT Re-Evaluation 09/30/21    Progress Note Due on Visit 10    PT Start Time 1148    PT Stop Time 1215   pt. requested early stop due to other feeling   PT Time Calculation (min) 27 min    Activity Tolerance Patient tolerated treatment well    Behavior During Therapy [facility] for tasks assessed/performed             Past Medical History:  Diagnosis Date   Celiac sprue 2007   CVD (cardiovascular disease)    Foot fracture, left 2011   GERD (gastroesophageal reflux disease)    Hyperlipidemia    Osteopenia 04/2019   T score -1.9 FRAX 10% / 1.9%.  Stable from prior DEXA   PVD (peripheral vascular disease) (HCC)    mild carotid ? fibromuscular dysplasia   Shingles    Urinary incontinence     Past Surgical History:  Procedure Laterality Date   APPENDECTOMY     BREAST SURGERY     right breast biopsy   ESOPHAGOGASTRODUODENOSCOPY     ROTATOR CUFF REPAIR     RIGHT   ROTATOR CUFF REPAIR     Left   stress cardiolite  04-18-00   VAGINAL HYSTERECTOMY  1993   leiomyomata    There were no vitals filed for this visit.   Subjective Assessment - 09/10/21 1150     Subjective Pt. indicated she didn't do much exercise this week due to other illness. Pt. indicated no pain upon arrival today.  Pt. indicated shoulder 2-3/10 at worst in last week.  Pt .indicated hip 2/10 at worst in hip.  Pt. stated the pain has gotten a lot better.  Pt. indicated she thought the shot has helped.    Pertinent History HTN, PVD, B hand OA, previous Lt elbow and foot fractures, bilateral RTC repairs     Limitations Walking;Lifting;House hold activities;Standing    Patient Stated Goals Return to walking for exercise without pain and be able to use the Rt shoulder without pain (including sleeping on the Rt side)    Currently in Pain? No/denies    Pain Score 0-No pain    Pain Onset More than a month ago    Pain Onset More than a month ago                [facility] PT Assessment - 09/10/21 0001       Assessment   Medical Diagnosis Chronic Rt shoulder pain and Rt lateral hip pain    Referring Provider (PT) Gregor Hams MD      AROM   Right Hip External Rotation  38   measured in supine 90 deg flexion   Right Hip Internal Rotation  18   measured in supine 90 deg flexion                          [facility] Adult PT Treatment/Exercise - 09/10/21 0001       Exercises   Other Exercises  HEP review and performance  as noted due to week of reduced HEP reported      Knee/Hip Exercises: Stretches   Other Knee/Hip Stretches Figure 4 push away 30 sec x 3, figure 4 pull towards 30 sec x 3 Rt      Knee/Hip Exercises: Supine   Bridges 2 sets;10 reps;Both      Knee/Hip Exercises: Sidelying   Hip ABduction Right;2 sets;10 reps      Shoulder Exercises: Supine   Other Supine Exercises supine posterior capsule cross arm stretch 10 sec x 5      Shoulder Exercises: Standing   External Rotation Right   3 x 10 c towel at side   Theraband Level (Shoulder External Rotation) Level 2 (Red)    Internal Rotation --    Theraband Level (Shoulder Internal Rotation) --                       PT Short Term Goals - 09/02/21 1626       PT SHORT TERM GOAL #1   Title Improve Rt shoulder IR AROM to at least 60 degrees and horizontal adduction AROM to 40 degrees.    Baseline Met IR goal, HA 30 degrees    Time 2    Period Weeks    Status Partially Met    Target Date 09/16/21      PT SHORT TERM GOAL #2   Title Improve bilateral  hip flexion to at least 100 degrees; ER AROM to 40  degrees and hamstrings to 50 degrees.    Baseline 95; 27-28; 35 degrees respectively    Time 2    Period Weeks    Status On-going    Target Date 09/16/21               PT Long Term Goals - 09/02/21 1626       PT LONG TERM GOAL #1   Title Improve FOTO to 55.    Baseline 51 (was 50)    Time 8    Period Weeks    Status On-going    Target Date 09/30/21      PT LONG TERM GOAL #2   Title [name] will report Rt shoulder and hip pain consistently 0-3/10 on the Numeric Pain Rating Scale.    Baseline Hip met, shoulder can still be 4/10    Time 8    Period Weeks    Status Partially Met    Target Date 09/30/21      PT LONG TERM GOAL #3   Title Improve Rt shoulder ER strength to at least 15 pounds.    Baseline 14 pounds (was < 7 pounds)    Time 8    Period Weeks    Status On-going    Target Date 09/30/21      PT LONG TERM GOAL #4   Title Improve Rt hip abductors strength to 5/5 MMT.    Baseline 4/5 MMT    Time 8    Period Weeks    Status On-going    Target Date 09/30/21      PT LONG TERM GOAL #5   Title [name] will be independent with her long-term HEP at DC.    Baseline Updated today    Time 8    Period Weeks    Status On-going    Target Date 09/30/21                   Plan - 09/10/21 1218  Clinical Impression Statement Despite recent illness, Pt. has continued to indicate improvement in overall symptoms.  Adjusted length of treatment today based off Pt. request.  Rt hip mobility measured improved compared to previous c IR still showing tightness.    Personal Factors and Comorbidities Comorbidity 3+    Comorbidities HTN, PVD, B hand OA, previous L elbow and foot fractures, B RTC repairs    Examination-Activity Limitations Reach Overhead;Stairs;Stand;Bed Mobility;Bend;Sleep;Lift;Carry;Locomotion Level;Squat    Examination-Participation Restrictions Cleaning;Community Activity    Stability/Clinical Decision Making Stable/Uncomplicated    Rehab Potential  Good    PT Frequency --   1-2X/week   PT Duration 4 weeks    PT Treatment/Interventions ADLs/Self Care Home Management;Cryotherapy;Moist Heat;Therapeutic activities;Therapeutic exercise;Neuromuscular re-education;Patient/family education;Manual techniques;Passive range of motion;Dry needling;Vasopneumatic Device    PT Next Visit Plan Resume progressive shoulder/hip strengthening.    PT Home Exercise Plan Access Code: [email or web address]    Consulted and Agree with Plan of Care Patient             Patient will benefit from skilled therapeutic intervention in order to improve the following deficits and impairments:  Decreased activity tolerance, Decreased endurance, Decreased range of motion, Difficulty walking, Decreased strength, Increased edema, Impaired flexibility, Impaired perceived functional ability, Impaired UE functional use, Postural dysfunction, Pain  Visit Diagnosis: Abnormal posture  Difficulty walking  Stiffness of right shoulder, not elsewhere classified  Stiffness of right hip, not elsewhere classified  Chronic right shoulder pain  Pain in right hip     Problem List Patient Active Problem List   Diagnosis Date Noted   Statin myopathy 01/13/2021   Dysuria 12/22/2020   Low back pain 12/22/2020   Closed fracture of left elbow 08/31/2020   Dyspnea 09/08/2019   COVID-19 virus infection 09/08/2019   Acute sinus infection 11/12/2018   Shingles outbreak 09/04/2018   Hip pain 09/04/2018   LUQ pain 07/19/2018   Bloating 07/19/2018   Hand arthritis 02/21/2018   Viral URI with cough 04/25/2017   Urinary frequency 11/20/2016   Lactose intolerance in adult 09/13/2016   Cough 12/22/2014   Osteoarthritis, 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   Well adult exam 12/04/2013   Back pain, thoracic 08/13/2012   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   Urinary incontinence    Right shoulder pain 12/13/2011   Sciatica of right side 07/06/2011   Hyperglycemia 06/07/2011   Abdominal pain 04/21/2011   WEAKNESS  09/21/2010   CAROTID OCCLUSIVE DISEASE 09/07/2010   WRIST PAIN 06/25/2010   Diarrhea 10/27/2009   DIZZINESS 07/28/2009   TOBACCO USE, QUIT 07/28/2009   Pain in joint 05/26/2009   Lumbago 11/05/2008   Irritable bowel syndrome 06/27/2008   GASTRITIS, HX OF 06/27/2008   Rash and other nonspecific skin eruption 06/02/2008   Dyslipidemia 04/23/2008   Generalized anxiety disorder 04/23/2008   DIVERTICULOSIS, COLON 04/23/2008   GERD 04/02/2008   Allergic rhinitis 12/11/2007   Vitamin D deficiency 08/15/2007   Peripheral vascular disease ([place]) 08/15/2007   Essential hypertension 03/24/2007    Scot Jun, PT, DPT, OCS, ATC 09/10/21  12:21 PM    [facility] [address] [place], [place], [address] Phone: [phone]   Fax:  [phone]  Name: [place] MRN: [id number] Date of Birth: [birth year]

## 2021-09-11 LAB — URINALYSIS, COMPLETE W/RFL CULTURE
Bilirubin Urine: NEGATIVE
Casts: NONE SEEN /LPF
Crystals: NONE SEEN /HPF
Glucose, UA: NEGATIVE
Hgb urine dipstick: NEGATIVE
Hyaline Cast: NONE SEEN /LPF
Ketones, ur: NEGATIVE
Nitrites, Initial: NEGATIVE
Protein, ur: NEGATIVE
RBC / HPF: NONE SEEN /HPF (ref 0–2)
Specific Gravity, Urine: 1.003 (ref 1.001–1.035)
Yeast: NONE SEEN /HPF
pH: 6 (ref 5.0–8.0)

## 2021-09-11 LAB — URINE CULTURE
MICRO NUMBER:: 12856878
SPECIMEN QUALITY:: ADEQUATE

## 2021-09-11 LAB — CULTURE INDICATED

## 2021-09-15 ENCOUNTER — Encounter: Payer: Medicare Other | Admitting: Rehabilitative and Restorative Service Providers"

## 2021-09-17 ENCOUNTER — Other Ambulatory Visit: Payer: Self-pay

## 2021-09-17 ENCOUNTER — Encounter: Payer: Self-pay | Admitting: Rehabilitative and Restorative Service Providers"

## 2021-09-17 ENCOUNTER — Ambulatory Visit: Payer: Medicare Other | Admitting: Rehabilitative and Restorative Service Providers"

## 2021-09-17 DIAGNOSIS — R293 Abnormal posture: Secondary | ICD-10-CM

## 2021-09-17 DIAGNOSIS — G8929 Other chronic pain: Secondary | ICD-10-CM | POA: Diagnosis not present

## 2021-09-17 DIAGNOSIS — M25651 Stiffness of right hip, not elsewhere classified: Secondary | ICD-10-CM | POA: Diagnosis not present

## 2021-09-17 DIAGNOSIS — R262 Difficulty in walking, not elsewhere classified: Secondary | ICD-10-CM

## 2021-09-17 DIAGNOSIS — M25551 Pain in right hip: Secondary | ICD-10-CM | POA: Diagnosis not present

## 2021-09-17 DIAGNOSIS — M25511 Pain in right shoulder: Secondary | ICD-10-CM

## 2021-09-17 DIAGNOSIS — M25611 Stiffness of right shoulder, not elsewhere classified: Secondary | ICD-10-CM

## 2021-09-17 NOTE — Patient Instructions (Signed)
Access Code: [email or web address] URL: [email or web address] Date: 09/17/2021 Prepared by: Vista Mink  Exercises  Shoulder External Rotation with Anchored Resistance with Towel Under Elbow - 1 x daily - 3 x weekly - 2 sets - 10 reps - 3 hold Supine Figure 4 Piriformis Stretch - 2-3 x daily - 7 x weekly - 1 sets - 4-5 reps - 20 seconds hold Sidelying Hip Abduction - 1 x daily - 7 x weekly - 2 sets - 10 reps - 3 seconds hold  Standing Shoulder Posterior Capsule Stretch - 1 x daily - 7 x weekly - 1 sets - 5 reps - 10 seconds hold

## 2021-09-17 NOTE — Therapy (Signed)
[facility] [address] [place], [place], [address] Phone: [phone]   Fax:  [phone]  Physical Therapy Treatment  Patient Details  Name: [place] MRN: [id number] Date of Birth: [birth year] Referring Provider (PT): Gregor Hams MD   Encounter Date: 09/17/2021   PT End of Session - 09/17/21 1348     Visit Number 5    Number of Visits 12    Date for PT Re-Evaluation 09/30/21    Progress Note Due on Visit 10    PT Start Time 1305    PT Stop Time 7858    PT Time Calculation (min) 40 min    Activity Tolerance Patient tolerated treatment well    Behavior During Therapy [facility] for tasks assessed/performed             Past Medical History:  Diagnosis Date   Celiac sprue 2007   CVD (cardiovascular disease)    Foot fracture, left 2011   GERD (gastroesophageal reflux disease)    Hyperlipidemia    Osteopenia 04/2019   T score -1.9 FRAX 10% / 1.9%.  Stable from prior DEXA   PVD (peripheral vascular disease) (HCC)    mild carotid ? fibromuscular dysplasia   Shingles    Urinary incontinence     Past Surgical History:  Procedure Laterality Date   APPENDECTOMY     BREAST SURGERY     right breast biopsy   ESOPHAGOGASTRODUODENOSCOPY     ROTATOR CUFF REPAIR     RIGHT   ROTATOR CUFF REPAIR     Left   stress cardiolite  04-18-00   VAGINAL HYSTERECTOMY  1993   leiomyomata    There were no vitals filed for this visit.   Subjective Assessment - 09/17/21 1313     Subjective Alianna feels very positively about her hip and shoulder progress.  Her shoulder is still a bit limiting whereas the hip rarely bothers her.    Pertinent History HTN, PVD, B hand OA, previous Lt elbow and foot fractures, bilateral RTC repairs    Limitations Walking;Lifting;House hold activities;Standing    Patient Stated Goals Return to walking for exercise without pain and be able to use the Rt shoulder without pain (including sleeping on the Rt side)     Currently in Pain? Yes    Pain Score 3     Pain Location Shoulder    Pain Orientation Right    Pain Descriptors / Indicators Aching    Pain Type Chronic pain    Pain Radiating Towards R lateral arm    Pain Onset More than a month ago    Pain Frequency Occasional    Aggravating Factors  Horizontal adduction, overuse, lie on R side    Pain Relieving Factors Exercise and change of position    Effect of Pain on Daily Activities Has to be careful with reaching and overhead function    Multiple Pain Sites Yes    Pain Score 2    Pain Location Hip    Pain Orientation Right    Pain Descriptors / Indicators Aching    Pain Type Chronic pain    Pain Radiating Towards R butt cheek    Pain Onset More than a month ago    Pain Frequency Occasional    Aggravating Factors  Lying on R side    Pain Relieving Factors Exercises    Effect of Pain on Daily Activities Can't sleep on the R side (hip and shoulder)  [facility] PT Assessment - 09/17/21 0001       ROM / Strength   AROM / PROM / Strength AROM;Strength      AROM   Overall AROM  Deficits    AROM Assessment Site Hip    Right/Left Shoulder Left;Right    Right/Left Hip Left;Right    Right Hip Flexion 110    Right Hip External Rotation  35    Right Hip Internal Rotation  5    Left Hip Flexion 100    Left Hip External Rotation  23    Left Hip Internal Rotation  15      Strength   Overall Strength Within functional limits for tasks performed    Overall Strength Comments No Trendelenburg noted.  Good strength noted with gait and therapeutic exercises.  Caylie will benefit from maintaining her current hip strength.      Flexibility   Soft Tissue Assessment /Muscle Length yes    Hamstrings 45 degrees B                           [facility] Adult PT Treatment/Exercise - 09/17/21 0001       Posture/Postural Control   Posture/Postural Control Postural limitations    Postural Limitations Rounded Shoulders       Neuro Re-ed    Neuro Re-ed Details  Tandem balance 5X 30 seconds each      Exercises   Exercises Knee/Hip;Shoulder      Knee/Hip Exercises: Stretches   Other Knee/Hip Stretches Figure 4 stretch 5X 20 seconds B      Knee/Hip Exercises: Sidelying   Hip ABduction Strengthening;Limitations;3 sets;10 reps;Both    Hip ABduction Limitations 3 seconds slow eccentrics      Shoulder Exercises: Supine   Other Supine Exercises --      Shoulder Exercises: Seated   Other Seated Exercises Posterior capsule stretch 10X 10 seconds      Shoulder Exercises: Standing   External Rotation Strengthening;Right;10 reps;Theraband;Limitations    Theraband Level (Shoulder External Rotation) Level 2 (Red)    External Rotation Limitations 4 sets slow eccentrics                     PT Education - 09/17/21 1347     Education Details Focused treatment on 2 key exercises for the hip and 2 key exercises for the shoulder.  Lots of feedback and correction required.    Person(s) Educated Patient    Methods Explanation;Demonstration;Tactile cues;Verbal cues;Handout    Comprehension Verbalized understanding;Tactile cues required;Need further instruction;Returned demonstration;Verbal cues required              PT Short Term Goals - 09/02/21 1626       PT SHORT TERM GOAL #1   Title Improve Rt shoulder IR AROM to at least 60 degrees and horizontal adduction AROM to 40 degrees.    Baseline Met IR goal, HA 30 degrees    Time 2    Period Weeks    Status Partially Met    Target Date 09/16/21      PT SHORT TERM GOAL #2   Title Improve bilateral  hip flexion to at least 100 degrees; ER AROM to 40 degrees and hamstrings to 50 degrees.    Baseline 95; 27-28; 35 degrees respectively    Time 2    Period Weeks    Status On-going    Target Date 09/16/21  PT Long Term Goals - 09/02/21 1626       PT LONG TERM GOAL #1   Title Improve FOTO to 55.    Baseline 51 (was 50)    Time 8     Period Weeks    Status On-going    Target Date 09/30/21      PT LONG TERM GOAL #2   Title Zaire will report Rt shoulder and hip pain consistently 0-3/10 on the Numeric Pain Rating Scale.    Baseline Hip met, shoulder can still be 4/10    Time 8    Period Weeks    Status Partially Met    Target Date 09/30/21      PT LONG TERM GOAL #3   Title Improve Rt shoulder ER strength to at least 15 pounds.    Baseline 14 pounds (was < 7 pounds)    Time 8    Period Weeks    Status On-going    Target Date 09/30/21      PT LONG TERM GOAL #4   Title Improve Rt hip abductors strength to 5/5 MMT.    Baseline 4/5 MMT    Time 8    Period Weeks    Status On-going    Target Date 09/30/21      PT LONG TERM GOAL #5   Title Carma will be independent with her long-term HEP at [place].    Baseline Updated today    Time 8    Period Weeks    Status On-going    Target Date 09/30/21                   Plan - 09/17/21 1546     Clinical Impression Statement Brenisha is making great progress towards meeting all LTGs.  We discussed doing another FOTO today and considering transfer into independent PT.  She requested to continue 1X/week for "a few" more weeks as she is still not comfortable with her shoulder program and strength and she would like continued help before transfer into independent PT.    Personal Factors and Comorbidities Comorbidity 3+    Comorbidities HTN, PVD, B hand OA, previous L elbow and foot fractures, B RTC repairs    Examination-Activity Limitations Reach Overhead;Stairs;Stand;Bed Mobility;Bend;Sleep;Lift;Carry;Locomotion Level;Squat    Examination-Participation Restrictions Cleaning;Community Activity    Stability/Clinical Decision Making Stable/Uncomplicated    Rehab Potential Good    PT Frequency --   1-2X/week   PT Duration 4 weeks    PT Treatment/Interventions ADLs/Self Care Home Management;Cryotherapy;Moist Heat;Therapeutic activities;Therapeutic exercise;Neuromuscular  re-education;Patient/family education;Manual techniques;Passive range of motion;Dry needling;Vasopneumatic Device    PT Next Visit Plan Posterior RTC strength and hip abductors strength.    PT Home Exercise Plan Access Code: [email or web address]    Consulted and Agree with Plan of Care Patient             Patient will benefit from skilled therapeutic intervention in order to improve the following deficits and impairments:  Decreased activity tolerance, Decreased endurance, Decreased range of motion, Difficulty walking, Decreased strength, Increased edema, Impaired flexibility, Impaired perceived functional ability, Impaired UE functional use, Postural dysfunction, Pain  Visit Diagnosis: Abnormal posture  Difficulty walking  Stiffness of right shoulder, not elsewhere classified  Stiffness of right hip, not elsewhere classified  Chronic right shoulder pain  Pain in right hip     Problem List Patient Active Problem List   Diagnosis Date Noted   Statin myopathy 01/13/2021   Dysuria 12/22/2020   Low back pain  12/22/2020   Closed fracture of left elbow 08/31/2020   Dyspnea 09/08/2019   COVID-19 virus infection 09/08/2019   Acute sinus infection 11/12/2018   Shingles outbreak 09/04/2018   Hip pain 09/04/2018   LUQ pain 07/19/2018   Bloating 07/19/2018   Hand arthritis 02/21/2018   Viral URI with cough 04/25/2017   Urinary frequency 11/20/2016   Lactose intolerance in adult 09/13/2016   Cough 12/22/2014   Osteoarthritis, 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   Well adult exam 12/04/2013   Back pain, thoracic 08/13/2012   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   Urinary incontinence    Right shoulder pain 12/13/2011   Sciatica of right side 07/06/2011   Hyperglycemia 06/07/2011   Abdominal pain 04/21/2011   WEAKNESS 09/21/2010   CAROTID OCCLUSIVE DISEASE 09/07/2010   WRIST PAIN 06/25/2010   Diarrhea 10/27/2009   DIZZINESS 07/28/2009   TOBACCO USE, QUIT 07/28/2009   Pain in joint 05/26/2009   Lumbago  11/05/2008   Irritable bowel syndrome 06/27/2008   GASTRITIS, HX OF 06/27/2008   Rash and other nonspecific skin eruption 06/02/2008   Dyslipidemia 04/23/2008   Generalized anxiety disorder 04/23/2008   DIVERTICULOSIS, COLON 04/23/2008   GERD 04/02/2008   Allergic rhinitis 12/11/2007   Vitamin D deficiency 08/15/2007   Peripheral vascular disease ([place]) 08/15/2007   Essential hypertension 03/24/2007    Farley Ly, PT, MPT 09/17/2021, 3:49 PM  [facility] [address] [place], [place], [address] Phone: [phone]   Fax:  [phone]  Name: Alliana Mcauliff MRN: [id number] Date of Birth: 10-18-1945

## 2021-09-30 ENCOUNTER — Encounter: Payer: Medicare Other | Admitting: Rehabilitative and Restorative Service Providers"

## 2021-10-04 ENCOUNTER — Ambulatory Visit: Payer: Medicare Other | Admitting: Family Medicine

## 2021-10-07 ENCOUNTER — Other Ambulatory Visit: Payer: Self-pay

## 2021-10-07 ENCOUNTER — Ambulatory Visit (INDEPENDENT_AMBULATORY_CARE_PROVIDER_SITE_OTHER): Payer: Medicare Other | Admitting: Family Medicine

## 2021-10-07 VITALS — BP 122/78 | HR 78 | Ht <= 58 in | Wt 128.4 lb

## 2021-10-07 DIAGNOSIS — G8929 Other chronic pain: Secondary | ICD-10-CM | POA: Diagnosis not present

## 2021-10-07 DIAGNOSIS — M25511 Pain in right shoulder: Secondary | ICD-10-CM | POA: Diagnosis not present

## 2021-10-07 NOTE — Progress Notes (Signed)
° °  I, [name], LAT, ATC acting as a scribe for [name], MD.  Sharon Paul is a 76 y.o. female who presents to [place] at [facility] today for f/u chronic R shoulder pain. Pt was last seen by Dr. Georgina Snell on 09/01/21 and was given a R subacromial steroid injection and was advised to cont PT, completing 5 total visits. Today, pt reports R shoulder has been feeling really good. Pt started back to the gym this morning and is experiencing some pain and soreness in her R shoulder.   Dx imaging: 05/17/21 R shoulder MRI (@ [facility])  R shoulder XR (@ [facility])  Pertinent review of systems: No fevers or chills  Relevant historical information: Peripheral vascular disease  Hypertension.   Exam:  BP 122/78    Pulse 78    Ht 4\' 9"  (1.448 m)    Wt 128 lb 6.4 oz (58.2 kg)    SpO2 98%    BMI 27.79 kg/m  General: Well Developed, well nourished, and in no acute distress.   MSK: Right shoulder: Normal. Normal motion. Intact strength.    Lab and Radiology Results Received right shoulder MRI report from [facility] dated May 17, 2021. Impression:  1.Status post right rotator cuff repair showing severe thinning of the posterior supraspinatus and anterior infraspinatus with an irregular high-grade articular sided partial tear with probable small full-thickness communications.  This involves the segment estimated at 13 mm AP and 33 mm medial to lateral.  Mild streak-like fatty infiltrate of the supraspinatus and infraspinatus muscles. 2.  Postoperative contour changes of the acromion and AC joint with no impingement factors.     MRI will be sent to scan     Assessment and Plan: 76 y.o. female with right shoulder pain due to rotator cuff chronic tears and tendinitis.  Patient had MRI at [facility] September 2022 as noted above. She is doing well with PT, home exercise program, and subacromial injection.  Plan to continue conservative  management strategies and recheck with me as needed.  We discussed that surgery would be a real challenge for her and should be avoided for now based on her conversation.   Total encounter time 20 minutes including face-to-face time with the patient and, reviewing past medical record, and charting on the date of service.   Discussion of current situation and future treatment plan and options for     Discussed warning signs or symptoms. Please see discharge instructions. Patient expresses understanding.   The above documentation has been reviewed and is accurate and complete [name], M.D.

## 2021-10-07 NOTE — Patient Instructions (Signed)
Thank you for coming in today.   Continue home exercises  Recheck back as needed

## 2021-10-11 ENCOUNTER — Other Ambulatory Visit: Payer: Self-pay

## 2021-10-11 ENCOUNTER — Ambulatory Visit (INDEPENDENT_AMBULATORY_CARE_PROVIDER_SITE_OTHER): Payer: Medicare Other | Admitting: Rehabilitative and Restorative Service Providers"

## 2021-10-11 ENCOUNTER — Encounter: Payer: Self-pay | Admitting: Rehabilitative and Restorative Service Providers"

## 2021-10-11 DIAGNOSIS — G8929 Other chronic pain: Secondary | ICD-10-CM | POA: Diagnosis not present

## 2021-10-11 DIAGNOSIS — M25511 Pain in right shoulder: Secondary | ICD-10-CM | POA: Diagnosis not present

## 2021-10-11 DIAGNOSIS — M25651 Stiffness of right hip, not elsewhere classified: Secondary | ICD-10-CM | POA: Diagnosis not present

## 2021-10-11 DIAGNOSIS — M25611 Stiffness of right shoulder, not elsewhere classified: Secondary | ICD-10-CM | POA: Diagnosis not present

## 2021-10-11 DIAGNOSIS — M25551 Pain in right hip: Secondary | ICD-10-CM

## 2021-10-11 DIAGNOSIS — R293 Abnormal posture: Secondary | ICD-10-CM

## 2021-10-11 DIAGNOSIS — R262 Difficulty in walking, not elsewhere classified: Secondary | ICD-10-CM

## 2021-10-11 NOTE — Patient Instructions (Signed)
Access Code: [email or web address] URL: [email or web address] Date: 10/11/2021 Prepared by: Scot Jun  Exercises Supine Shoulder Internal Rotation Stretch - 1 x daily - 7 x weekly - 1 sets - 10 reps - 10 seconds hold Shoulder External Rotation with Anchored Resistance with Towel Under Elbow - 1 x daily - 3 x weekly - 2 sets - 10 reps - 3 hold Supine Figure 4 Piriformis Stretch - 2-3 x daily - 7 x weekly - 1 sets - 4-5 reps - 20 seconds hold Sidelying Hip Abduction - 1 x daily - 7 x weekly - 2 sets - 10 reps - 3 seconds hold Standing Shoulder Row with Anchored Resistance - 2 x daily - 7 x weekly - 3 sets - 10 reps Shoulder Extension with Resistance - 2 x daily - 7 x weekly - 3 sets - 10 reps Standing Shoulder Posterior Capsule Stretch - 1 x daily - 7 x weekly - 1 sets - 5 reps - 10 seconds hold

## 2021-10-11 NOTE — Therapy (Signed)
[facility] [address] [place], [place], [address] Phone: [phone]   Fax:  [phone]  Physical Therapy Treatment / Recertification Progress Note  Patient Details  Name: Sharon Paul MRN: [id number] Date of Birth: April 06, 1946 Referring Provider (PT): Sharon Hams MD   Encounter Date: 10/11/2021  Progress Note Reporting Period 09/05/2020 to 10/11/2021  See note below for Objective Data and Assessment of Progress/Goals.       PT End of Session - 10/11/21 1357     Visit Number 6    Number of Visits 12    Date for PT Re-Evaluation 09/30/21    Progress Note Due on Visit 10    PT Start Time 1352    PT Stop Time 1430    PT Time Calculation (min) 38 min    Activity Tolerance Patient tolerated treatment well    Behavior During Therapy [facility] for tasks assessed/performed             Past Medical History:  Diagnosis Date   Celiac sprue 2007   CVD (cardiovascular disease)    Foot fracture, left 2011   GERD (gastroesophageal reflux disease)    Hyperlipidemia    Osteopenia 04/2019   T score -1.9 FRAX 10% / 1.9%.  Stable from prior DEXA   PVD (peripheral vascular disease) (HCC)    mild carotid ? fibromuscular dysplasia   Shingles    Urinary incontinence     Past Surgical History:  Procedure Laterality Date   APPENDECTOMY     BREAST SURGERY     right breast biopsy   ESOPHAGOGASTRODUODENOSCOPY     ROTATOR CUFF REPAIR     RIGHT   ROTATOR CUFF REPAIR     Left   stress cardiolite  04-18-00   VAGINAL HYSTERECTOMY  1993   leiomyomata    There were no vitals filed for this visit.   Subjective Assessment - 10/11/21 1357     Subjective Pt. indicated Global Rating of change at +5 quite a bit better and exercises have helped.  Pt. indicated having times feeling Rt shoulder c quick movements (dog pulling) but better than before.    Pertinent History HTN, PVD, B hand OA, previous Lt elbow and foot fractures, bilateral RTC  repairs    Limitations Walking;Lifting;House hold activities;Standing    Patient Stated Goals Return to walking for exercise without pain and be able to use the Rt shoulder without pain (including sleeping on the Rt side)    Currently in Pain? No/denies    Pain Score 5    at worst   Pain Descriptors / Indicators Sharp    Pain Type Chronic pain    Pain Onset More than a month ago    Pain Frequency Occasional    Aggravating Factors  dog pulling ,quick movement at times    Pain Relieving Factors tylenol at night    Pain Score 3   at worst   Pain Location Hip    Pain Orientation Right    Pain Onset More than a month ago    Pain Frequency Occasional                [facility] PT Assessment - 10/11/21 0001       Assessment   Medical Diagnosis Chronic Rt shoulder pain and Rt lateral hip pain    Referring Provider (PT) Sharon Hams MD      Observation/Other Assessments   Focus on Therapeutic Outcomes (FOTO)  update 29  Strength   Right Shoulder Flexion 5/5    Right Shoulder Extension 5/5    Right Shoulder Internal Rotation 5/5    Right Shoulder External Rotation 4+/5   15, 14.8 lbs   Right Hip ABduction 5/5    Left Hip ABduction 5/5                           [facility] Adult PT Treatment/Exercise - 10/11/21 0001       Exercises   Other Exercises  Verbal and visual review of HEP per handout      Shoulder Exercises: Standing   External Rotation Right   c towel at side 3 x 10   Theraband Level (Shoulder External Rotation) Level 3 (Green)    Extension 20 reps;Both    Theraband Level (Shoulder Extension) Level 3 (Green)    Row Both;20 reps    Theraband Level (Shoulder Row) Level 3 (Green)    Other Standing Exercises standing wall push up x 15 c SA press hold      Shoulder Exercises: Stretch   Other Shoulder Stretches cross arm stretch 15 sec x 3 Rt                     PT Education - 10/11/21 1418     Education Details HEP review.  Trial HEP  period for up to 30 days then discharge education    Person(s) Educated Patient    Methods Explanation;Demonstration;Verbal cues;Handout    Comprehension Returned demonstration;Verbalized understanding              PT Short Term Goals - 10/11/21 1419       PT SHORT TERM GOAL #1   Title Improve Rt shoulder IR AROM to at least 60 degrees and horizontal adduction AROM to 40 degrees.    Time 2    Period Weeks    Status Partially Met    Target Date 09/16/21      PT SHORT TERM GOAL #2   Title Improve bilateral  hip flexion to at least 100 degrees; ER AROM to 40 degrees and hamstrings to 50 degrees.    Time 2    Period Weeks    Status Partially Met    Target Date 09/16/21               PT Long Term Goals - 10/11/21 1413       PT LONG TERM GOAL #1   Title Improve FOTO to 55.    Baseline 51 (was 50)    Time 8    Period Weeks    Status Achieved    Target Date 09/30/21      PT LONG TERM GOAL #2   Title Sharon Paul will report Rt shoulder and hip pain consistently 0-3/10 on the Numeric Pain Rating Scale.    Baseline Hip met, shoulder can still be 4/10    Time 4    Period Weeks    Status Revised    Target Date 11/08/21      PT LONG TERM GOAL #3   Title Improve Rt shoulder ER strength to at least 15 pounds.    Time 4    Period Weeks    Status Revised    Target Date 11/08/21      PT LONG TERM GOAL #4   Title Improve Rt hip abductors strength to 5/5 MMT.    Baseline 4/5 MMT    Time 8  Period Weeks    Status Achieved    Target Date 09/30/21      PT LONG TERM GOAL #5   Title Sharon Paul will be independent with her long-term HEP at DC.    Time 4    Period Weeks    Status Revised    Target Date 11/08/21                   Plan - 10/11/21 1419     Clinical Impression Statement Pt. has attended 5 visits overall during course of treatment.  See objective data for updated information. Pt. has demonstrated gains towards established goals but did present c Rt  shoulder pain, strength deficits that could continue to improve to reach all established goals.  Recommend extension of POC by 30 days with goals of improved Rt arm strength and ultimately  of HEP transitioning when appropriate.    Personal Factors and Comorbidities Comorbidity 3+    Comorbidities HTN, PVD, Bilateral hand OA, previous L elbow and foot fractures, B RTC repairs    Examination-Activity Limitations Reach Overhead;Stairs;Stand;Bed Mobility;Bend;Sleep;Lift;Carry;Locomotion Level;Squat    Examination-Participation Restrictions Cleaning;Community Activity    Stability/Clinical Decision Making Stable/Uncomplicated    Rehab Potential Good    PT Frequency 1x / week   1-2X/week   PT Duration 4 weeks    PT Treatment/Interventions ADLs/Self Care Home Management;Cryotherapy;Moist Heat;Therapeutic activities;Therapeutic exercise;Neuromuscular re-education;Patient/family education;Manual techniques;Passive range of motion;Dry needling;Vasopneumatic Device    PT Next Visit Plan HEP transitioning as able, dry needling for shoulder as necessary    PT Home Exercise Plan Access Code: [email or web address]    Consulted and Agree with Plan of Care Patient             Patient will benefit from skilled therapeutic intervention in order to improve the following deficits and impairments:  Decreased activity tolerance, Decreased endurance, Decreased range of motion, Difficulty walking, Decreased strength, Increased edema, Impaired flexibility, Impaired perceived functional ability, Impaired UE functional use, Postural dysfunction, Pain  Visit Diagnosis: Abnormal posture  Difficulty walking  Stiffness of right shoulder, not elsewhere classified  Stiffness of right hip, not elsewhere classified  Chronic right shoulder pain  Pain in right hip     Problem List Patient Active Problem List   Diagnosis Date Noted   Statin myopathy 01/13/2021   Dysuria 12/22/2020   Low back pain 12/22/2020   Closed  fracture of left elbow 08/31/2020   Dyspnea 09/08/2019   COVID-19 virus infection 09/08/2019   Acute sinus infection 11/12/2018   Shingles outbreak 09/04/2018   Hip pain 09/04/2018   LUQ pain 07/19/2018   Bloating 07/19/2018   Hand arthritis 02/21/2018   Viral URI with cough 04/25/2017   Urinary frequency 11/20/2016   Lactose intolerance in adult 09/13/2016   Cough 12/22/2014   Osteoarthritis, 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   Well adult exam 12/04/2013   Back pain, thoracic 08/13/2012   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   Urinary incontinence    Right shoulder pain 12/13/2011   Sciatica of right side 07/06/2011   Hyperglycemia 06/07/2011   Abdominal pain 04/21/2011   WEAKNESS 09/21/2010   CAROTID OCCLUSIVE DISEASE 09/07/2010   WRIST PAIN 06/25/2010   Diarrhea 10/27/2009   DIZZINESS 07/28/2009   TOBACCO USE, QUIT 07/28/2009   Pain in joint 05/26/2009   Lumbago 11/05/2008   Irritable bowel syndrome 06/27/2008   GASTRITIS, HX OF 06/27/2008   Rash and other nonspecific skin eruption 06/02/2008   Dyslipidemia 04/23/2008   Generalized anxiety disorder 04/23/2008   DIVERTICULOSIS,  COLON 04/23/2008   GERD 04/02/2008   Allergic rhinitis 12/11/2007   Vitamin D deficiency 08/15/2007   Peripheral vascular disease ([place]) 08/15/2007   Essential hypertension 03/24/2007    Scot Jun, PT, DPT, OCS, ATC 10/11/21  2:29 PM    [facility] [address] [place], [place], [address] Phone: [phone]   Fax:  [phone]  Name: Sharon Paul MRN: [id number] Date of Birth: [birth year]

## 2021-10-14 ENCOUNTER — Other Ambulatory Visit: Payer: Self-pay

## 2021-10-14 ENCOUNTER — Encounter: Payer: Self-pay | Admitting: Internal Medicine

## 2021-10-14 ENCOUNTER — Ambulatory Visit (INDEPENDENT_AMBULATORY_CARE_PROVIDER_SITE_OTHER): Payer: Medicare Other | Admitting: Internal Medicine

## 2021-10-14 VITALS — BP 134/76 | HR 56 | Temp 98.1°F | Ht <= 58 in | Wt 127.0 lb

## 2021-10-14 DIAGNOSIS — I10 Essential (primary) hypertension: Secondary | ICD-10-CM

## 2021-10-14 DIAGNOSIS — E785 Hyperlipidemia, unspecified: Secondary | ICD-10-CM | POA: Diagnosis not present

## 2021-10-14 DIAGNOSIS — Z0000 Encounter for general adult medical examination without abnormal findings: Secondary | ICD-10-CM

## 2021-10-14 DIAGNOSIS — R413 Other amnesia: Secondary | ICD-10-CM

## 2021-10-14 DIAGNOSIS — T466X5A Adverse effect of antihyperlipidemic and antiarteriosclerotic drugs, initial encounter: Secondary | ICD-10-CM | POA: Diagnosis not present

## 2021-10-14 DIAGNOSIS — R739 Hyperglycemia, unspecified: Secondary | ICD-10-CM

## 2021-10-14 DIAGNOSIS — G720 Drug-induced myopathy: Secondary | ICD-10-CM

## 2021-10-14 MED ORDER — IPRATROPIUM BROMIDE 0.06 % NA SOLN: 2.0000 | Freq: Three times a day (TID) | NASAL | 2 refills | Status: DC

## 2021-10-14 MED ORDER — PANTOPRAZOLE SODIUM 40 MG PO TBEC: 40.0000 mg | DELAYED_RELEASE_TABLET | Freq: Two times a day (BID) | ORAL | 3 refills | Status: DC

## 2021-10-14 NOTE — Assessment & Plan Note (Signed)
On [place] to d/c statin (Crestor) due to pain Fish oil

## 2021-10-14 NOTE — Assessment & Plan Note (Signed)
°  We discussed age appropriate health related issues, including available/recomended screening tests and vaccinations. Labs were ordered to be later reviewed . All questions were answered. We discussed one or more of the following - seat belt use, use of sunscreen/sun exposure exercise, safe sex, fall risk reduction, second hand smoke exposure, firearm use and storage, seat belt use, a need for adhering to healthy diet and exercise. Labs were ordered.  All questions were answered. Chair yoga, balance exercises Colonoscopy is due in 2024 Mammogram every year

## 2021-10-14 NOTE — Assessment & Plan Note (Signed)
On [place] to d/c statin due to pain Fish oil

## 2021-10-14 NOTE — Progress Notes (Signed)
Subjective:  Patient ID: Sharon Paul, female    DOB: July 26, 1946  Age: 76 y.o. MRN: [id number]  CC: No chief complaint on file.   HPI Sharon Paul presents for a well exam C/o shoulders, hip pains  C/o runny nose when eating Pt lost wt  Outpatient Medications Prior to Visit  Medication Sig Dispense Refill   acetaminophen (TYLENOL) 500 MG tablet Take 500 mg by mouth every 6 (six) hours as needed.     alum & mag hydroxide-simeth (MAALOX/MYLANTA) 200-200-20 MG/5ML suspension Take 15 mLs by mouth every 6 (six) hours as needed for indigestion or heartburn.     aspirin 81 MG tablet Take 81 mg by mouth daily.     Bacillus Coagulans-Inulin (ALIGN PREBIOTIC-PROBIOTIC PO) Take 1 capsule by mouth. Takes for 7 days at a time if needed     Calcium-Vitamin D-Vitamin K (VIACTIV CALCIUM PLUS D PO) Take 1 tablet by mouth daily.     Cholecalciferol 2000 UNITS TABS Take 1 tablet by mouth daily.     Coenzyme Q10 (CO Q-10) 100 MG CHEW Chew 1 capsule by mouth daily.     Cyanocobalamin (B-12) 3000 MCG SUBL Place 1 tablet under the tongue daily as needed (energy).     diazepam (VALIUM) 5 MG tablet Take 5 mg by mouth 2 (two) times daily as needed.     Evolocumab (REPATHA SURECLICK) 951 MG/ML SOAJ Inject 1 pen into the skin every 14 (fourteen) days. 6 mL 3   fexofenadine (ALLEGRA) 180 MG tablet Take 180 mg by mouth daily.     FLUoxetine (PROZAC) 10 MG capsule TAKE 1 CAPSULE BY MOUTH  DAILY 90 capsule 3   fluticasone (FLONASE) 50 MCG/ACT nasal spray USE 2 SPRAYS NASALLY DAILY 48 g 3   irbesartan (AVAPRO) 300 MG tablet TAKE 0.5 TABLETS (150 MG TOTAL) BY MOUTH 2 (TWO) TIMES DAILY. 90 tablet 2   labetalol (NORMODYNE) 300 MG tablet TAKE 1 TABLET BY MOUTH 2 TIMES DAILY. 60 tablet 4   LORazepam (ATIVAN) 0.5 MG tablet TAKE 1 TABLET BY MOUTH TWICE A DAY (Patient taking differently: 0.5 mg as needed.) 60 tablet 3   Multiple Vitamins-Minerals (MULTIVITAMIN WOMEN 50+) TABS Take 1 tablet by mouth daily.      NONFORMULARY OR COMPOUNDED ITEM compounded vaginal estradiol cream 0.02% insert 1 gram vaginally nightly x 2 weeks, then every other night x 2 weeks, then twice weekly. 90 each 3   rosuvastatin (CRESTOR) 5 MG tablet TAKE 1 TABLET BY MOUTH EVERY OTHER DAY 45 tablet 2   valACYclovir (VALTREX) 1000 MG tablet Take 2 tablets at the earliest onset of cold sore symptoms.  Repeat with 2 tablets 12 hours later 12 tablet 1   diphenoxylate-atropine (LOMOTIL) 2.5-0.025 MG tablet TAKE 1-2 TABLETS BY MOUTH 4 (FOUR) TIMES DAILY AS NEEDED FOR DIARRHEA OR LOOSE STOOLS. 60 tablet 1   lipase/protease/amylase (CREON) 36000 UNITS CPEP capsule Take 1 capsule (36,000 Units total) by mouth 3 (three) times daily before meals. Take with the first bite of food 90 capsule 3   pantoprazole (PROTONIX) 40 MG tablet Take 1 tablet (40 mg total) by mouth daily. Annual appt due in Feb 2023 must see provider for future refills 90 tablet 1   No facility-administered medications prior to visit.    ROS: Review of Systems  Constitutional:  Negative for activity change, appetite change, chills, fatigue and unexpected weight change.  HENT:  Negative for congestion, mouth sores and sinus pressure.   Eyes:  Negative for visual disturbance.  Respiratory:  Negative for cough and chest tightness.   Gastrointestinal:  Negative for abdominal pain and nausea.  Genitourinary:  Negative for difficulty urinating, frequency and vaginal pain.  Musculoskeletal:  Positive for arthralgias and myalgias. Negative for back pain and gait problem.  Skin:  Negative for pallor and rash.  Neurological:  Positive for weakness. Negative for dizziness, tremors, numbness and headaches.  Psychiatric/Behavioral:  Negative for confusion and sleep disturbance.    Objective:  BP 134/76 (BP Location: Right Arm, Patient Position: Sitting, Cuff Size: Large)    Pulse (!) 56    Temp 98.1 F (36.7 C) (Oral)    Ht 4\' 9"  (1.448 m)    Wt 127 lb (57.6 kg)    SpO2 98%     BMI 27.48 kg/m   BP Readings from Last 3 Encounters:  10/14/21 134/76  10/07/21 122/78  09/08/21 132/82    Wt Readings from Last 3 Encounters:  10/14/21 127 lb (57.6 kg)  10/07/21 128 lb 6.4 oz (58.2 kg)  09/01/21 130 lb (59 kg)    Physical Exam Constitutional:      General: She is not in acute distress.    Appearance: She is well-developed.  HENT:     Head: Normocephalic.     Right Ear: External ear normal.     Left Ear: External ear normal.     Nose: Nose normal.  Eyes:     General:        Right eye: No discharge.        Left eye: No discharge.     Conjunctiva/sclera: Conjunctivae normal.     Pupils: Pupils are equal, round, and reactive to light.  Neck:     Thyroid: No thyromegaly.     Vascular: No JVD.     Trachea: No tracheal deviation.  Cardiovascular:     Rate and Rhythm: Normal rate and regular rhythm.     Heart sounds: Normal heart sounds.  Pulmonary:     Effort: No respiratory distress.     Breath sounds: No stridor. No wheezing.  Abdominal:     General: Bowel sounds are normal. There is no distension.     Palpations: Abdomen is soft. There is no mass.     Tenderness: There is no abdominal tenderness. There is no guarding or rebound.  Musculoskeletal:        General: No tenderness.     Cervical back: Normal range of motion and neck supple. No rigidity.  Lymphadenopathy:     Cervical: No cervical adenopathy.  Skin:    Findings: No erythema or rash.  Neurological:     Cranial Nerves: No cranial nerve deficit.     Motor: No abnormal muscle tone.     Coordination: Coordination normal.     Deep Tendon Reflexes: Reflexes normal.  Psychiatric:        Behavior: Behavior normal.        Thought Content: Thought content normal.        Judgment: Judgment normal.    Lab Results  Component Value Date   WBC 3.9 (L) 10/23/2020   HGB 12.6 10/23/2020   HCT 38.1 10/23/2020   PLT 254.0 10/23/2020   GLUCOSE 97 10/23/2020   CHOL 138 03/15/2021   TRIG 91  03/15/2021   HDL 74 03/15/2021   LDLDIRECT 138.2 10/22/2010   LDLCALC 47 03/15/2021   ALT 20 03/15/2021   AST 18 03/15/2021   NA 138 10/23/2020   K 4.2 10/23/2020   CL 103  10/23/2020   CREATININE 0.82 10/23/2020   BUN 14 10/23/2020   CO2 31 10/23/2020   TSH 2.56 10/23/2020   HGBA1C 5.9 10/23/2020    No results found.  Assessment & Plan:   Problem List Items Addressed This Visit     Dyslipidemia    On [place] to d/c statin (Crestor) due to pain Fish oil      Essential hypertension   Hyperglycemia - Primary   Relevant Orders   Hemoglobin A1c   Memory problem    You can try Lion's Mane Mushroom capsules for memory       Statin myopathy    On [place] to d/c statin due to pain Fish oil      Relevant Orders   CK   Well adult exam     We discussed age appropriate health related issues, including available/recomended screening tests and vaccinations. Labs were ordered to be later reviewed . All questions were answered. We discussed one or more of the following - seat belt use, use of sunscreen/sun exposure exercise, safe sex, fall risk reduction, second hand smoke exposure, firearm use and storage, seat belt use, a need for adhering to healthy diet and exercise. Labs were ordered.  All questions were answered. Chair yoga, balance exercises Colonoscopy is due in 2024 Mammogram every year      Relevant Orders   TSH   Urinalysis   CBC with Differential/Platelet   Lipid panel   Comprehensive metabolic panel   Hemoglobin A1c   CK      Meds ordered this encounter  Medications   ipratropium (ATROVENT) 0.06 % nasal spray    Sig: Place 2 sprays into the nose 3 (three) times daily.    Dispense:  15 mL    Refill:  2   pantoprazole (PROTONIX) 40 MG tablet    Sig: Take 1 tablet (40 mg total) by mouth 2 (two) times daily. Annual appt due in Feb 2023 must see provider for future refills    Dispense:  180 tablet    Refill:  3    Requesting 1 year supply       Follow-up: Return in about 6 months (around 04/13/2022) for a follow-up visit.  Walker Kehr, MD

## 2021-10-14 NOTE — Patient Instructions (Addendum)
You can try Lion's Mane Mushroom capsules for memory problems, decreased focus, mental fog, neuropathy ([place].[employer])    Sign up for [employer] ( via [employer] on your phone or your ipad). If you don't have a Art therapist card  - go to [employer] branch. They will set you up in 15 minutes. It is free. You can check out books to read and to listen, check out magazines and newspapers, movies etc.   The Obesity Code book by Sharman Cheek   These suggestions will probably help you to improve your metabolism if you are not overweight and to lose weight if you are overweight: 1.  Reduce your consumption of sugars and starches.  Eliminate high fructose corn syrup from your diet.  Reduce your consumption of processed foods.  For desserts try to have seasonal fruits, berries, nuts, cheeses or dark chocolate with more than 70% cacao. 2.  Do not snack 3.  You do not have to eat breakfast.  If you choose to have breakfast - eat plain greek yogurt, eggs, oatmeal (without sugar) - use honey if you need to. 4.  Drink water, freshly brewed unsweetened tea (green, black or herbal) or coffee.  Do not drink sodas including diet sodas , juices, beverages sweetened with artificial sweeteners. 5.  Reduce your consumption of refined grains. 6.  Avoid protein drinks such as Optifast, Slim fast etc. Eat chicken, fish, meat, dairy and beans for your sources of protein. 7.  Natural unprocessed fats like cold pressed virgin olive oil, butter, coconut oil are good for you.  Eat avocados. 8.  Increase your consumption of fiber.  Fruits, berries, vegetables, whole grains, flaxseed, chia seeds, beans, popcorn, nuts, oatmeal are good sources of fiber 9.  Use vinegar in your diet, i.e. apple cider vinegar, red wine or balsamic vinegar 10.  You can try fasting.  For example you can skip breakfast and lunch every other day (24-hour fast) 11.  Stress reduction, good night sleep, relaxation, meditation, yoga and other physical  activity is likely to help you to maintain low weight too. 12.  If you drink alcohol, limit your alcohol intake to no more than 2 drinks a day.

## 2021-10-14 NOTE — Assessment & Plan Note (Addendum)
You can try Lion's Mane Mushroom capsules for memory

## 2021-10-16 ENCOUNTER — Other Ambulatory Visit: Payer: Self-pay | Admitting: Internal Medicine

## 2021-10-28 LAB — URINALYSIS
Bilirubin Urine: NEGATIVE
Hgb urine dipstick: NEGATIVE
Ketones, ur: NEGATIVE
Leukocytes,Ua: NEGATIVE
Nitrite: NEGATIVE
Specific Gravity, Urine: 1.015 (ref 1.000–1.030)
Total Protein, Urine: NEGATIVE
Urine Glucose: NEGATIVE
Urobilinogen, UA: 0.2 (ref 0.0–1.0)
pH: 6 (ref 5.0–8.0)

## 2021-10-28 LAB — COMPREHENSIVE METABOLIC PANEL
ALT: 20 U/L (ref 0–35)
AST: 21 U/L (ref 0–37)
Albumin: 4.5 g/dL (ref 3.5–5.2)
Alkaline Phosphatase: 80 U/L (ref 39–117)
BUN: 12 mg/dL (ref 6–23)
CO2: 29 mEq/L (ref 19–32)
Calcium: 9.7 mg/dL (ref 8.4–10.5)
Chloride: 103 mEq/L (ref 96–112)
Creatinine, Ser: 0.82 mg/dL (ref 0.40–1.20)
GFR: 70.17 mL/min (ref >60.00)
Glucose, Bld: 105 mg/dL — ABNORMAL HIGH (ref 70–99)
Potassium: 4.1 mEq/L (ref 3.5–5.1)
Sodium: 137 mEq/L (ref 135–145)
Total Bilirubin: 0.5 mg/dL (ref 0.2–1.2)
Total Protein: 6.8 g/dL (ref 6.0–8.3)

## 2021-10-28 LAB — CBC WITH DIFFERENTIAL/PLATELET
Basophils Absolute: 0 10*3/uL (ref 0.0–0.1)
Basophils Relative: 1.1 % (ref 0.0–3.0)
Eosinophils Absolute: 0.1 10*3/uL (ref 0.0–0.7)
Eosinophils Relative: 2.3 % (ref 0.0–5.0)
HCT: 39 % (ref 36.0–46.0)
Hemoglobin: 12.8 g/dL (ref 12.0–15.0)
Lymphocytes Relative: 41 % (ref 12.0–46.0)
Lymphs Abs: 1.5 10*3/uL (ref 0.7–4.0)
MCHC: 32.8 g/dL (ref 30.0–36.0)
MCV: 92.5 fl (ref 78.0–100.0)
Monocytes Absolute: 0.4 10*3/uL (ref 0.1–1.0)
Monocytes Relative: 9.9 % (ref 3.0–12.0)
Neutro Abs: 1.6 10*3/uL (ref 1.4–7.7)
Neutrophils Relative %: 45.7 % (ref 43.0–77.0)
Platelets: 251 10*3/uL (ref 150.0–400.0)
RBC: 4.22 Mil/uL (ref 3.87–5.11)
RDW: 12.7 % (ref 11.5–15.5)
WBC: 3.6 10*3/uL — ABNORMAL LOW (ref 4.0–10.5)

## 2021-10-28 LAB — TSH: TSH: 1.73 u[IU]/mL (ref 0.35–5.50)

## 2021-10-28 LAB — CK: Total CK: 118 U/L (ref 7–177)

## 2021-10-28 LAB — HEMOGLOBIN A1C: Hgb A1c MFr Bld: 6.1 % (ref 4.6–6.5)

## 2021-10-28 LAB — LIPID PANEL
Cholesterol: 197 mg/dL (ref 0–200)
HDL: 69.2 mg/dL (ref >39.00)
LDL Cholesterol: 100 mg/dL — ABNORMAL HIGH (ref 0–99)
NonHDL: 127.65
Total CHOL/HDL Ratio: 3
Triglycerides: 137 mg/dL (ref 0.0–149.0)
VLDL: 27.4 mg/dL (ref 0.0–40.0)

## 2021-10-29 ENCOUNTER — Telehealth: Payer: Self-pay | Admitting: Internal Medicine

## 2021-10-29 NOTE — Telephone Encounter (Signed)
Called pt, LVM to discuss.  

## 2021-10-29 NOTE — Telephone Encounter (Signed)
Patient calling in ? ?Would like cb from nurse to discuss her results regarding her A1C & cholesterol  ? ?CB [phone] ?

## 2021-11-02 ENCOUNTER — Other Ambulatory Visit: Payer: Self-pay | Admitting: Internal Medicine

## 2021-12-01 ENCOUNTER — Telehealth: Payer: Self-pay | Admitting: Internal Medicine

## 2021-12-01 NOTE — Telephone Encounter (Signed)
Pt states she started vomiting and having diarrhea on 12-01-2021 ? ?Pt states her husband had similar symptoms and after 24 hrs he is feeling better, pt has taking pepto bismol w/ no relief ? ?Pt requesting a cb w/ provider's recommendations for otc medications  ?

## 2021-12-04 NOTE — Telephone Encounter (Signed)
Take Imodium A-D as needed.  Hope it has resolved.  Thanks ?

## 2021-12-13 ENCOUNTER — Ambulatory Visit: Payer: Medicare Other | Admitting: Obstetrics and Gynecology

## 2021-12-13 ENCOUNTER — Encounter: Payer: Self-pay | Admitting: Obstetrics and Gynecology

## 2021-12-13 VITALS — BP 126/70 | HR 60 | Ht <= 58 in | Wt 129.0 lb

## 2021-12-13 DIAGNOSIS — R319 Hematuria, unspecified: Secondary | ICD-10-CM | POA: Diagnosis not present

## 2021-12-13 DIAGNOSIS — N952 Postmenopausal atrophic vaginitis: Secondary | ICD-10-CM | POA: Diagnosis not present

## 2021-12-13 DIAGNOSIS — N3090 Cystitis, unspecified without hematuria: Secondary | ICD-10-CM | POA: Diagnosis not present

## 2021-12-13 MED ORDER — SULFAMETHOXAZOLE-TRIMETHOPRIM 800-160 MG PO TABS: 1.0000 | ORAL_TABLET | Freq: Two times a day (BID) | ORAL | 0 refills | Status: DC

## 2021-12-13 MED ORDER — NONFORMULARY OR COMPOUNDED ITEM: 1 refills | Status: DC

## 2021-12-13 NOTE — Progress Notes (Signed)
GYNECOLOGY  VISIT ?  ?HPI: ?76 y.o.   Married  Hispanic  female   ?[id number] with No LMP recorded. Patient has had a hysterectomy.   ?here for urinary frequency and hematuria.   ? ?After intercourse last weekend, noticed dryness.  ? ?Today developed suprapubic pain and urgency.  ?She also noticed some blood from the vagina.  ? ?She took Pyridium, which helped the pain.  ? ?Hx E Coli UTI 09/08/21, treated with Bactrim DS. ? ?GYNECOLOGIC HISTORY: ?No LMP recorded. Patient has had a hysterectomy. ?Contraception:  Hyst ?Menopausal hormone therapy: Estradiol cream ?Last mammogram:  07-06-21 Neg/[facility] ?Last pap smear:   2012 Neg ?       ?OB History   ? ? Gravida  ?3  ? Para  ?2  ? Term  ?2  ? Preterm  ?   ? AB  ?1  ? Living  ?2  ?  ? ? SAB  ?   ? IAB  ?   ? Ectopic  ?   ? Multiple  ?   ? Live Births  ?   ?   ?  ?  ?    ? ?Patient Active Problem List  ? Diagnosis Date Noted  ? Memory problem 10/14/2021  ? Statin myopathy 01/13/2021  ? Dysuria 12/22/2020  ? Low back pain 12/22/2020  ? Closed fracture of left elbow 08/31/2020  ? Dyspnea 09/08/2019  ? COVID-19 virus infection 09/08/2019  ? Acute sinus infection 11/12/2018  ? Shingles outbreak 09/04/2018  ? Hip pain 09/04/2018  ? LUQ pain 07/19/2018  ? Bloating 07/19/2018  ? Hand arthritis 02/21/2018  ? Viral URI with cough 04/25/2017  ? Urinary frequency 11/20/2016  ? Lactose intolerance in adult 09/13/2016  ? Cough 12/22/2014  ? 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 Well adult exam 12/04/2013  ? Back pain, thoracic 08/13/2012  ? 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  ? Urinary incontinence   ? Right shoulder pain 12/13/2011  ? Sciatica of right side 07/06/2011  ? Hyperglycemia 06/07/2011  ? Abdominal pain 04/21/2011  ? WEAKNESS 09/21/2010  ? CAROTID OCCLUSIVE DISEASE 09/07/2010  ? WRIST PAIN 06/25/2010  ? Diarrhea 10/27/2009  ? DIZZINESS 07/28/2009  ? TOBACCO USE, QUIT 07/28/2009  ? Pain in joint 05/26/2009  ? Lumbago 11/05/2008  ? Irritable bowel syndrome 06/27/2008  ? GASTRITIS, HX OF  06/27/2008  ? Rash and other nonspecific skin eruption 06/02/2008  ? Dyslipidemia 04/23/2008  ? Generalized anxiety disorder 04/23/2008  ? DIVERTICULOSIS, COLON 04/23/2008  ? GERD 04/02/2008  ? Allergic rhinitis 12/11/2007  ? Vitamin D deficiency 08/15/2007  ? Peripheral vascular disease ([place]) 08/15/2007  ? Essential hypertension 03/24/2007  ? ? ?Past Medical History:  ?Diagnosis Date  ? Celiac sprue 2007  ? CVD (cardiovascular disease)   ? Foot fracture, left 2011  ? GERD (gastroesophageal reflux disease)   ? Hyperlipidemia   ? Osteopenia 04/2019  ? T score -1.9 FRAX 10% / 1.9%.  Stable from prior DEXA  ? PVD (peripheral vascular disease) ([place])   ? mild carotid ? fibromuscular dysplasia  ? Shingles   ? Urinary incontinence   ? ? ?Past Surgical History:  ?Procedure Laterality Date  ? APPENDECTOMY    ? BREAST SURGERY    ? right breast biopsy  ? ESOPHAGOGASTRODUODENOSCOPY    ? ROTATOR CUFF REPAIR    ? RIGHT  ? ROTATOR CUFF REPAIR    ? Left  ? stress cardiolite  04-18-00  ? VAGINAL HYSTERECTOMY  1993  ? leiomyomata  ? ? ?  Current Outpatient Medications  ?Medication Sig Dispense Refill  ? acetaminophen (TYLENOL) 500 MG tablet Take 500 mg by mouth every 6 (six) hours as needed.    ? alum & mag hydroxide-simeth (MAALOX/MYLANTA) 200-200-20 MG/5ML suspension Take 15 mLs by mouth every 6 (six) hours as needed for indigestion or heartburn.    ? aspirin 81 MG tablet Take 81 mg by mouth daily.    ? Bacillus Coagulans-Inulin (ALIGN PREBIOTIC-PROBIOTIC PO) Take 1 capsule by mouth. Takes for 7 days at a time if needed    ? Calcium-Vitamin D-Vitamin K (VIACTIV CALCIUM PLUS D PO) Take 1 tablet by mouth daily.    ? Cholecalciferol 2000 UNITS TABS Take 1 tablet by mouth daily.    ? diazepam (VALIUM) 5 MG tablet Take 5 mg by mouth 2 (two) times daily as needed.    ? Evolocumab (REPATHA SURECLICK) 782 MG/ML SOAJ Inject 1 pen into the skin every 14 (fourteen) days. 6 mL 3  ? fexofenadine (ALLEGRA) 180 MG tablet Take 180 mg by mouth daily.     ? FLUoxetine (PROZAC) 10 MG capsule TAKE 1 CAPSULE BY MOUTH  DAILY 90 capsule 3  ? fluticasone (FLONASE) 50 MCG/ACT nasal spray USE 2 SPRAYS NASALLY DAILY 48 g 3  ? irbesartan (AVAPRO) 300 MG tablet TAKE 0.5 TABLETS (150 MG TOTAL) BY MOUTH 2 (TWO) TIMES DAILY. 90 tablet 3  ? labetalol (NORMODYNE) 300 MG tablet TAKE 1 TABLET BY MOUTH  TWICE DAILY 180 tablet 3  ? LORazepam (ATIVAN) 0.5 MG tablet TAKE 1 TABLET BY MOUTH TWICE A DAY (Patient taking differently: 0.5 mg as needed.) 60 tablet 3  ? Multiple Vitamins-Minerals (MULTIVITAMIN WOMEN 50+) TABS Take 1 tablet by mouth daily.    ? pantoprazole (PROTONIX) 40 MG tablet TAKE 1 TABLET BY MOUTH  DAILY ANNUAL APPOINTMENT  DUE IN FEB 2023 MUST SEE  PROVIDER FOR FUTURE REFILLS 90 tablet 3  ? rosuvastatin (CRESTOR) 5 MG tablet TAKE 1 TABLET BY MOUTH EVERY OTHER DAY 45 tablet 2  ? valACYclovir (VALTREX) 1000 MG tablet Take 2 tablets at the earliest onset of cold sore symptoms.  Repeat with 2 tablets 12 hours later 12 tablet 1  ? NONFORMULARY OR COMPOUNDED ITEM compounded vaginal estradiol cream 0.02% insert 1 gram vaginally nightly x 2 weeks, then every other night x 2 weeks, then twice weekly. (Patient not taking: Reported on 12/13/2021) 90 each 3  ? ?No current facility-administered medications for this visit.  ?  ? ?ALLERGIES: Amlodipine, Augmentin [amoxicillin-pot clavulanate], Clarithromycin, Codeine, Esomeprazole magnesium, Hydrochlorothiazide, Iohexol, Kapidex [dexlansoprazole], Losartan potassium, Olmesartan medoxomil, Ramipril, and Risedronate sodium ? ?Family History  ?Problem Relation Age of Onset  ? Glaucoma Mother   ? Heart disease Mother   ? Hypertension Mother   ? Heart disease Father   ? Hypertension Father   ? Stroke Father   ? Hypertension Paternal Grandmother   ? Lung cancer Paternal Grandmother   ? Hypertension Sister   ? Hypertension Brother   ? Glaucoma Brother   ? Colon cancer Neg Hx   ? ? ?Social History  ? ?Socioeconomic History  ? Marital status:  Married  ?  Spouse name: Theotis Burrow  ? Number of children: 2  ? Years of education: Not on file  ? Highest education level: Not on file  ?Occupational History  ? Occupation: semi-retired  ?Tobacco Use  ? Smoking status: Former  ?  Types: Cigarettes  ?  Quit date: 02/11/1970  ?  Years since quitting: 51.8  ?  Smokeless tobacco: Never  ?Vaping Use  ? Vaping Use: Never used  ?Substance and Sexual Activity  ? Alcohol use: Yes  ?  Alcohol/week: 0.0 standard drinks  ?  Comment: very rare  ? Drug use: No  ? Sexual activity: Yes  ?  Birth control/protection: Surgical, Post-menopausal  ?  Comment: 1st intercourse 76 yo-Fewer than 5 partners,des neg  ?Other Topics Concern  ? Not on file  ?Social History Narrative  ? Not on file  ? ?Social Determinants of Health  ? ?Financial Resource Strain: Low Risk   ? Difficulty of Paying Living Expenses: Not hard at all  ?Food Insecurity: No Food Insecurity  ? Worried About Charity fundraiser in the Last Year: Never true  ? Ran Out of Food in the Last Year: Never true  ?Transportation Needs: No Transportation Needs  ? Lack of Transportation (Medical): No  ? Lack of Transportation (Non-Medical): No  ?Physical Activity: Sufficiently Active  ? Days of Exercise per Week: 5 days  ? Minutes of Exercise per Session: 30 min  ?Stress: No Stress Concern Present  ? Feeling of Stress : Not at all  ?Social Connections: Socially Integrated  ? Frequency of Communication with Friends and Family: More than three times a week  ? Frequency of Social Gatherings with Friends and Family: More than three times a week  ? Attends Religious Services: More than 4 times per year  ? Active Member of Clubs or Organizations: Yes  ? Attends Archivist Meetings: More than 4 times per year  ? Marital Status: Married  ?Intimate Partner Violence: Not At Risk  ? Fear of Current or Ex-Partner: No  ? Emotionally Abused: No  ? Physically Abused: No  ? Sexually Abused: No  ? ? ?Review of Systems  ?Genitourinary:   Positive for frequency and hematuria.  ?All other systems reviewed and are negative. ? ?PHYSICAL EXAMINATION:   ? ?BP 126/70   Pulse 60   Ht '4\' 9"'$  (1.448 m)   Wt 129 lb (58.5 kg)   SpO2 98%   BMI 27.92 kg/

## 2021-12-13 NOTE — Patient Instructions (Signed)
Urinary Tract Infection, Adult  A urinary tract infection (UTI) is an infection of any part of the urinary tract. The urinary tract includes the kidneys, ureters, bladder, and urethra. These organs make, store, and get rid of urine in the body. An upper UTI affects the ureters and kidneys. A lower UTI affects the bladder and urethra. What are the causes? Most urinary tract infections are caused by bacteria in your genital area around your urethra, where urine leaves your body. These bacteria grow and cause inflammation of your urinary tract. What increases the risk? You are more likely to develop this condition if: You have a urinary catheter that stays in place. You are not able to control when you urinate or have a bowel movement (incontinence). You are female and you: Use a spermicide or diaphragm for birth control. Have low estrogen levels. Are pregnant. You have certain genes that increase your risk. You are sexually active. You take antibiotic medicines. You have a condition that causes your flow of urine to slow down, such as: An enlarged prostate, if you are female. Blockage in your urethra. A kidney stone. A nerve condition that affects your bladder control (neurogenic bladder). Not getting enough to drink, or not urinating often. You have certain medical conditions, such as: Diabetes. A weak disease-fighting system (immunesystem). Sickle cell disease. Gout. Spinal cord injury. What are the signs or symptoms? Symptoms of this condition include: Needing to urinate right away (urgency). Frequent urination. This may include small amounts of urine each time you urinate. Pain or burning with urination. Blood in the urine. Urine that smells bad or unusual. Trouble urinating. Cloudy urine. Vaginal discharge, if you are female. Pain in the abdomen or the lower back. You may also have: Vomiting or a decreased appetite. Confusion. Irritability or tiredness. A fever or  chills. Diarrhea. The first symptom in older adults may be confusion. In some cases, they may not have any symptoms until the infection has worsened. How is this diagnosed? This condition is diagnosed based on your medical history and a physical exam. You may also have other tests, including: Urine tests. Blood tests. Tests for STIs (sexually transmitted infections). If you have had more than one UTI, a cystoscopy or imaging studies may be done to determine the cause of the infections. How is this treated? Treatment for this condition includes: Antibiotic medicine. Over-the-counter medicines to treat discomfort. Drinking enough water to stay hydrated. If you have frequent infections or have other conditions such as a kidney stone, you may need to see a health care provider who specializes in the urinary tract (urologist). In rare cases, urinary tract infections can cause sepsis. Sepsis is a life-threatening condition that occurs when the body responds to an infection. Sepsis is treated in the hospital with IV antibiotics, fluids, and other medicines. Follow these instructions at home:  Medicines Take over-the-counter and prescription medicines only as told by your health care provider. If you were prescribed an antibiotic medicine, take it as told by your health care provider. Do not stop using the antibiotic even if you start to feel better. General instructions Make sure you: Empty your bladder often and completely. Do not hold urine for long periods of time. Empty your bladder after sex. Wipe from front to back after urinating or having a bowel movement if you are female. Use each tissue only one time when you wipe. Drink enough fluid to keep your urine pale yellow. Keep all follow-up visits. This is important. Contact a health   care provider if: Your symptoms do not get better after 1-2 days. Your symptoms go away and then return. Get help right away if: You have severe pain in  your back or your lower abdomen. You have a fever or chills. You have nausea or vomiting. Summary A urinary tract infection (UTI) is an infection of any part of the urinary tract, which includes the kidneys, ureters, bladder, and urethra. Most urinary tract infections are caused by bacteria in your genital area. Treatment for this condition often includes antibiotic medicines. If you were prescribed an antibiotic medicine, take it as told by your health care provider. Do not stop using the antibiotic even if you start to feel better. Keep all follow-up visits. This is important. This information is not intended to replace advice given to you by your health care provider. Make sure you discuss any questions you have with your health care provider. Document Revised: 03/27/2020 Document Reviewed: 03/27/2020 Elsevier Patient Education  2023 Elsevier Inc.  

## 2021-12-16 LAB — URINALYSIS, COMPLETE W/RFL CULTURE
Bilirubin Urine: NEGATIVE
Glucose, UA: NEGATIVE
Hyaline Cast: NONE SEEN /LPF
Ketones, ur: NEGATIVE
Nitrites, Initial: POSITIVE — AB
Specific Gravity, Urine: 1.007 (ref 1.001–1.035)
WBC, UA: >=60 /HPF — AB (ref 0–5)
pH: 5.5 (ref 5.0–8.0)

## 2021-12-16 LAB — URINE CULTURE
MICRO NUMBER:: 13272351
SPECIMEN QUALITY:: ADEQUATE

## 2021-12-16 LAB — CULTURE INDICATED

## 2021-12-17 DIAGNOSIS — M75101 Unspecified rotator cuff tear or rupture of right shoulder, not specified as traumatic: Secondary | ICD-10-CM | POA: Insufficient documentation

## 2021-12-17 DIAGNOSIS — M25511 Pain in right shoulder: Secondary | ICD-10-CM | POA: Diagnosis not present

## 2021-12-20 ENCOUNTER — Other Ambulatory Visit: Payer: Self-pay | Admitting: Internal Medicine

## 2021-12-21 MED ORDER — ROSUVASTATIN CALCIUM 10 MG PO TABS: 5.0000 mg | ORAL_TABLET | ORAL | 3 refills | Status: DC

## 2021-12-21 NOTE — Telephone Encounter (Signed)
Rosuvastatin 5 mg is on backorder will send in the 10 mg and have the pt take 1/2 QOD.  ? ?LM for the pt to call back to be sure that she has a pill cutter or the pharmacy may be able to cut for her. The pills are not scored.  ?

## 2021-12-27 DIAGNOSIS — M79621 Pain in right upper arm: Secondary | ICD-10-CM | POA: Diagnosis not present

## 2021-12-27 DIAGNOSIS — R531 Weakness: Secondary | ICD-10-CM | POA: Diagnosis not present

## 2021-12-27 DIAGNOSIS — M25611 Stiffness of right shoulder, not elsewhere classified: Secondary | ICD-10-CM | POA: Diagnosis not present

## 2021-12-27 DIAGNOSIS — M25511 Pain in right shoulder: Secondary | ICD-10-CM | POA: Diagnosis not present

## 2021-12-31 ENCOUNTER — Other Ambulatory Visit: Payer: Self-pay | Admitting: *Deleted

## 2021-12-31 DIAGNOSIS — M25511 Pain in right shoulder: Secondary | ICD-10-CM | POA: Diagnosis not present

## 2021-12-31 DIAGNOSIS — M25611 Stiffness of right shoulder, not elsewhere classified: Secondary | ICD-10-CM | POA: Diagnosis not present

## 2021-12-31 DIAGNOSIS — M79621 Pain in right upper arm: Secondary | ICD-10-CM | POA: Diagnosis not present

## 2021-12-31 DIAGNOSIS — R531 Weakness: Secondary | ICD-10-CM | POA: Diagnosis not present

## 2021-12-31 MED ORDER — IRBESARTAN 300 MG PO TABS: 150.0000 mg | ORAL_TABLET | Freq: Two times a day (BID) | ORAL | 3 refills | Status: DC

## 2022-01-03 DIAGNOSIS — M79621 Pain in right upper arm: Secondary | ICD-10-CM | POA: Diagnosis not present

## 2022-01-03 DIAGNOSIS — R531 Weakness: Secondary | ICD-10-CM | POA: Diagnosis not present

## 2022-01-03 DIAGNOSIS — M25611 Stiffness of right shoulder, not elsewhere classified: Secondary | ICD-10-CM | POA: Diagnosis not present

## 2022-01-03 DIAGNOSIS — M25511 Pain in right shoulder: Secondary | ICD-10-CM | POA: Diagnosis not present

## 2022-01-10 DIAGNOSIS — R531 Weakness: Secondary | ICD-10-CM | POA: Diagnosis not present

## 2022-01-10 DIAGNOSIS — M25511 Pain in right shoulder: Secondary | ICD-10-CM | POA: Diagnosis not present

## 2022-01-10 DIAGNOSIS — M79621 Pain in right upper arm: Secondary | ICD-10-CM | POA: Diagnosis not present

## 2022-01-10 DIAGNOSIS — M25611 Stiffness of right shoulder, not elsewhere classified: Secondary | ICD-10-CM | POA: Diagnosis not present

## 2022-01-14 DIAGNOSIS — M25511 Pain in right shoulder: Secondary | ICD-10-CM | POA: Diagnosis not present

## 2022-01-14 DIAGNOSIS — R531 Weakness: Secondary | ICD-10-CM | POA: Diagnosis not present

## 2022-01-14 DIAGNOSIS — M79621 Pain in right upper arm: Secondary | ICD-10-CM | POA: Diagnosis not present

## 2022-01-14 DIAGNOSIS — M25611 Stiffness of right shoulder, not elsewhere classified: Secondary | ICD-10-CM | POA: Diagnosis not present

## 2022-01-18 DIAGNOSIS — M25611 Stiffness of right shoulder, not elsewhere classified: Secondary | ICD-10-CM | POA: Diagnosis not present

## 2022-01-18 DIAGNOSIS — M79621 Pain in right upper arm: Secondary | ICD-10-CM | POA: Diagnosis not present

## 2022-01-18 DIAGNOSIS — M25511 Pain in right shoulder: Secondary | ICD-10-CM | POA: Diagnosis not present

## 2022-01-18 DIAGNOSIS — R531 Weakness: Secondary | ICD-10-CM | POA: Diagnosis not present

## 2022-01-21 DIAGNOSIS — R531 Weakness: Secondary | ICD-10-CM | POA: Diagnosis not present

## 2022-01-21 DIAGNOSIS — M25611 Stiffness of right shoulder, not elsewhere classified: Secondary | ICD-10-CM | POA: Diagnosis not present

## 2022-01-21 DIAGNOSIS — M79621 Pain in right upper arm: Secondary | ICD-10-CM | POA: Diagnosis not present

## 2022-01-21 DIAGNOSIS — M25511 Pain in right shoulder: Secondary | ICD-10-CM | POA: Diagnosis not present

## 2022-01-26 DIAGNOSIS — M79621 Pain in right upper arm: Secondary | ICD-10-CM | POA: Diagnosis not present

## 2022-01-26 DIAGNOSIS — M25511 Pain in right shoulder: Secondary | ICD-10-CM | POA: Diagnosis not present

## 2022-01-26 DIAGNOSIS — R531 Weakness: Secondary | ICD-10-CM | POA: Diagnosis not present

## 2022-01-26 DIAGNOSIS — M25611 Stiffness of right shoulder, not elsewhere classified: Secondary | ICD-10-CM | POA: Diagnosis not present

## 2022-02-03 DIAGNOSIS — M25511 Pain in right shoulder: Secondary | ICD-10-CM | POA: Diagnosis not present

## 2022-02-03 DIAGNOSIS — R531 Weakness: Secondary | ICD-10-CM | POA: Diagnosis not present

## 2022-02-03 DIAGNOSIS — M25611 Stiffness of right shoulder, not elsewhere classified: Secondary | ICD-10-CM | POA: Diagnosis not present

## 2022-02-03 DIAGNOSIS — M79621 Pain in right upper arm: Secondary | ICD-10-CM | POA: Diagnosis not present

## 2022-02-07 DIAGNOSIS — M25511 Pain in right shoulder: Secondary | ICD-10-CM | POA: Diagnosis not present

## 2022-02-14 ENCOUNTER — Ambulatory Visit (INDEPENDENT_AMBULATORY_CARE_PROVIDER_SITE_OTHER): Payer: Medicare Other | Admitting: Internal Medicine

## 2022-02-14 ENCOUNTER — Encounter: Payer: Self-pay | Admitting: Internal Medicine

## 2022-02-14 DIAGNOSIS — M25511 Pain in right shoulder: Secondary | ICD-10-CM

## 2022-02-14 DIAGNOSIS — R413 Other amnesia: Secondary | ICD-10-CM | POA: Diagnosis not present

## 2022-02-14 DIAGNOSIS — I10 Essential (primary) hypertension: Secondary | ICD-10-CM | POA: Diagnosis not present

## 2022-02-14 DIAGNOSIS — G8929 Other chronic pain: Secondary | ICD-10-CM

## 2022-02-14 DIAGNOSIS — E785 Hyperlipidemia, unspecified: Secondary | ICD-10-CM | POA: Diagnosis not present

## 2022-02-14 MED ORDER — TRAMADOL-ACETAMINOPHEN 37.5-325 MG PO TABS: 1.0000 | ORAL_TABLET | Freq: Four times a day (QID) | ORAL | 1 refills | Status: DC | PRN

## 2022-02-14 NOTE — Assessment & Plan Note (Signed)
BP Readings from Last 3 Encounters:  02/14/22 120/80  12/13/21 126/70  10/14/21 134/76

## 2022-02-14 NOTE — Assessment & Plan Note (Signed)
On Repatha Check Lipids

## 2022-02-14 NOTE — Progress Notes (Signed)
Subjective:  Patient ID: Sharon Paul, female    DOB: 01-27-1946  Age: 76 y.o. MRN: [id number]  CC: No chief complaint on file.   HPI Sharon Paul presents for R shoulder pain  - severe at times, especially after doing housework, i.e. vacuuming etc. she would have a sleepless night because of the pain.  She saw Dr. Augustin Coupe several times, had right shoulder injections.  Dr. Augustin Coupe suggested shoulder arthroplasty.  The patient has been on meloxicam daily, Tylenol as needed.  Follow-up on hypertension, dyslipidemia  Outpatient Medications Prior to Visit  Medication Sig Dispense Refill   acetaminophen (TYLENOL) 500 MG tablet Take 500 mg by mouth every 6 (six) hours as needed.     alum & mag hydroxide-simeth (MAALOX/MYLANTA) 200-200-20 MG/5ML suspension Take 15 mLs by mouth every 6 (six) hours as needed for indigestion or heartburn.     aspirin 81 MG tablet Take 81 mg by mouth daily.     Bacillus Coagulans-Inulin (ALIGN PREBIOTIC-PROBIOTIC PO) Take 1 capsule by mouth. Takes for 7 days at a time if needed     Calcium-Vitamin D-Vitamin K (VIACTIV CALCIUM PLUS D PO) Take 1 tablet by mouth daily.     Cholecalciferol 2000 UNITS TABS Take 1 tablet by mouth daily.     diazepam (VALIUM) 5 MG tablet Take 5 mg by mouth 2 (two) times daily as needed.     Evolocumab (REPATHA SURECLICK) 678 MG/ML SOAJ Inject 1 pen into the skin every 14 (fourteen) days. 6 mL 3   fexofenadine (ALLEGRA) 180 MG tablet Take 180 mg by mouth daily.     FLUoxetine (PROZAC) 10 MG capsule TAKE 1 CAPSULE BY MOUTH  DAILY 90 capsule 3   fluticasone (FLONASE) 50 MCG/ACT nasal spray USE 2 SPRAYS NASALLY DAILY 48 g 3   irbesartan (AVAPRO) 300 MG tablet Take 0.5 tablets (150 mg total) by mouth 2 (two) times daily. 90 tablet 3   labetalol (NORMODYNE) 300 MG tablet TAKE 1 TABLET BY MOUTH  TWICE DAILY 180 tablet 3   LORazepam (ATIVAN) 0.5 MG tablet TAKE 1 TABLET BY MOUTH TWICE A DAY (Patient taking differently: 0.5 mg as  needed.) 60 tablet 3   Multiple Vitamins-Minerals (MULTIVITAMIN WOMEN 50+) TABS Take 1 tablet by mouth daily.     NONFORMULARY OR COMPOUNDED ITEM compounded vaginal estradiol cream 0.02% insert 1 gram vaginally twice a week at bedtime.  Disp:  60 grams. 60 each 1   pantoprazole (PROTONIX) 40 MG tablet TAKE 1 TABLET BY MOUTH  DAILY ANNUAL APPOINTMENT  DUE IN FEB 2023 MUST SEE  PROVIDER FOR FUTURE REFILLS 90 tablet 3   PFIZER COVID-19 VAC BIVALENT injection      rosuvastatin (CRESTOR) 10 MG tablet Take 0.5 tablets (5 mg total) by mouth every other day. 30 tablet 3   sulfamethoxazole-trimethoprim (BACTRIM DS) 800-160 MG tablet Take 1 tablet by mouth 2 (two) times daily. One PO BID x 3 days 6 tablet 0   valACYclovir (VALTREX) 1000 MG tablet Take 2 tablets at the earliest onset of cold sore symptoms.  Repeat with 2 tablets 12 hours later 12 tablet 1   No facility-administered medications prior to visit.    ROS: Review of Systems  Constitutional:  Negative for activity change, appetite change, chills, fatigue and unexpected weight change.  HENT:  Negative for congestion, mouth sores and sinus pressure.   Eyes:  Negative for visual disturbance.  Respiratory:  Negative for cough and chest tightness.   Gastrointestinal:  Negative for abdominal pain  and nausea.  Genitourinary:  Negative for difficulty urinating, frequency and vaginal pain.  Musculoskeletal:  Positive for arthralgias. Negative for back pain and gait problem.  Skin:  Negative for pallor and rash.  Neurological:  Negative for dizziness, tremors, weakness, numbness and headaches.  Psychiatric/Behavioral:  Negative for confusion and sleep disturbance.     Objective:  BP 120/80 (BP Location: Left Arm, Patient Position: Sitting, Cuff Size: Normal)   Pulse 68   Temp 97.8 F (36.6 C) (Oral)   Ht '4\' 9"'$  (1.448 m)   Wt 129 lb (58.5 kg)   SpO2 96%   BMI 27.92 kg/m   BP Readings from Last 3 Encounters:  02/14/22 120/80  12/13/21  126/70  10/14/21 134/76    Wt Readings from Last 3 Encounters:  02/14/22 129 lb (58.5 kg)  12/13/21 129 lb (58.5 kg)  10/14/21 127 lb (57.6 kg)    Physical Exam Constitutional:      General: She is not in acute distress.    Appearance: She is well-developed.  HENT:     Head: Normocephalic.     Right Ear: External ear normal.     Left Ear: External ear normal.     Nose: Nose normal.  Eyes:     General:        Right eye: No discharge.        Left eye: No discharge.     Conjunctiva/sclera: Conjunctivae normal.     Pupils: Pupils are equal, round, and reactive to light.  Neck:     Thyroid: No thyromegaly.     Vascular: No JVD.     Trachea: No tracheal deviation.  Cardiovascular:     Rate and Rhythm: Normal rate and regular rhythm.     Heart sounds: Normal heart sounds.  Pulmonary:     Effort: No respiratory distress.     Breath sounds: No stridor. No wheezing.  Abdominal:     General: Bowel sounds are normal. There is no distension.     Palpations: Abdomen is soft. There is no mass.     Tenderness: There is no abdominal tenderness. There is no guarding or rebound.  Musculoskeletal:        General: Tenderness present.     Cervical back: Normal range of motion and neck supple. No rigidity.  Lymphadenopathy:     Cervical: No cervical adenopathy.  Skin:    Findings: No erythema or rash.  Neurological:     Cranial Nerves: No cranial nerve deficit.     Motor: No abnormal muscle tone.     Coordination: Coordination normal.     Deep Tendon Reflexes: Reflexes normal.  Psychiatric:        Behavior: Behavior normal.        Thought Content: Thought content normal.        Judgment: Judgment normal.   Right shoulder is very painful with range of motion  Lab Results  Component Value Date   WBC 3.6 (L) 10/28/2021   HGB 12.8 10/28/2021   HCT 39.0 10/28/2021   PLT 251.0 10/28/2021   GLUCOSE 105 (H) 10/28/2021   CHOL 197 10/28/2021   TRIG 137.0 10/28/2021   HDL 69.20  10/28/2021   LDLDIRECT 138.2 10/22/2010   LDLCALC 100 (H) 10/28/2021   ALT 20 10/28/2021   AST 21 10/28/2021   NA 137 10/28/2021   K 4.1 10/28/2021   CL 103 10/28/2021   CREATININE 0.82 10/28/2021   BUN 12 10/28/2021   CO2 29 10/28/2021  TSH 1.73 10/28/2021   HGBA1C 6.1 10/28/2021    No results found.  Assessment & Plan:   Problem List Items Addressed This Visit     Dyslipidemia    On Repatha Check Lipids      Relevant Orders   Comprehensive metabolic panel   Lipid panel   Essential hypertension    BP Readings from Last 3 Encounters:  02/14/22 120/80  12/13/21 126/70  10/14/21 134/76        Relevant Orders   Comprehensive metabolic panel   Lipid panel   Memory problem    Better on Lion's Mane Mushroom capsules for memory      Right shoulder pain    Worse.  R shoulder pain  - severe at times, especially after doing housework, i.e. vacuuming etc. she would have a sleepless night because of the pain.  She saw Dr. Augustin Coupe several times, had right shoulder injections.  Dr. Augustin Coupe suggested shoulder arthroplasty.  The patient has been on meloxicam daily, Tylenol as needed. Blue-Emu cream was recommended to use 2-3 times a day Tramadol/APAP prn for severe pain.  Potential benefits of a short/long term opioids use as well as potential risks (i.e. addiction risk, apnea etc) and complications (i.e. Somnolence, constipation and others) were explained to the patient and were aknowledged.  I think that the patient is medically cleared to have right shoulder arthroplasty.  She should get house cleaner.          Meds ordered this encounter  Medications   traMADol-acetaminophen (ULTRACET) 37.5-325 MG tablet    Sig: Take 1 tablet by mouth every 6 (six) hours as needed.    Dispense:  20 tablet    Refill:  1      Follow-up: Return in about 4 months (around 06/16/2022) for a follow-up visit.  Walker Kehr, MD

## 2022-02-14 NOTE — Patient Instructions (Signed)
Blue-Emu cream -- use 2-3 times a day ? ?

## 2022-02-14 NOTE — Assessment & Plan Note (Signed)
Better on Lion's Mane Mushroom capsules for memory

## 2022-02-14 NOTE — Assessment & Plan Note (Addendum)
Worse.  R shoulder pain  - severe at times, especially after doing housework, i.e. vacuuming etc. she would have a sleepless night because of the pain.  She saw Dr. Augustin Coupe several times, had right shoulder injections.  Dr. Augustin Coupe suggested shoulder arthroplasty.  The patient has been on meloxicam daily, Tylenol as needed. Blue-Emu cream was recommended to use 2-3 times a day Tramadol/APAP prn for severe pain.  Potential benefits of a short/long term opioids use as well as potential risks (i.e. addiction risk, apnea etc) and complications (i.e. Somnolence, constipation and others) were explained to the patient and were aknowledged.  I think that the patient is medically cleared to have right shoulder arthroplasty.  She should get house cleaner.

## 2022-02-21 ENCOUNTER — Other Ambulatory Visit: Payer: Medicare Other

## 2022-02-21 LAB — LIPID PANEL
Cholesterol: 118 mg/dL (ref 0–200)
HDL: 62.7 mg/dL (ref >39.00)
LDL Cholesterol: 28 mg/dL (ref 0–99)
NonHDL: 54.89
Total CHOL/HDL Ratio: 2
Triglycerides: 134 mg/dL (ref 0.0–149.0)
VLDL: 26.8 mg/dL (ref 0.0–40.0)

## 2022-02-21 LAB — COMPREHENSIVE METABOLIC PANEL
ALT: 19 U/L (ref 0–35)
AST: 19 U/L (ref 0–37)
Albumin: 4.3 g/dL (ref 3.5–5.2)
Alkaline Phosphatase: 76 U/L (ref 39–117)
BUN: 11 mg/dL (ref 6–23)
CO2: 28 mEq/L (ref 19–32)
Calcium: 9.6 mg/dL (ref 8.4–10.5)
Chloride: 106 mEq/L (ref 96–112)
Creatinine, Ser: 0.85 mg/dL (ref 0.40–1.20)
GFR: 67.06 mL/min (ref >60.00)
Glucose, Bld: 103 mg/dL — ABNORMAL HIGH (ref 70–99)
Potassium: 4.3 mEq/L (ref 3.5–5.1)
Sodium: 140 mEq/L (ref 135–145)
Total Bilirubin: 0.5 mg/dL (ref 0.2–1.2)
Total Protein: 6.3 g/dL (ref 6.0–8.3)

## 2022-02-23 ENCOUNTER — Telehealth: Payer: Self-pay | Admitting: Internal Medicine

## 2022-02-23 NOTE — Telephone Encounter (Signed)
Pt requesting over the counter cough medicine recommendation that will not affect blood pressure  Pt stated elevated Glucose advise   Please Advise

## 2022-02-24 NOTE — Telephone Encounter (Signed)
Delsym cough syrup is okay to use.  Thanks

## 2022-02-24 NOTE — Telephone Encounter (Signed)
LDVM for pt of Dr. Judeen Hammans suggestions.

## 2022-03-04 ENCOUNTER — Telehealth: Payer: Self-pay | Admitting: Internal Medicine

## 2022-03-04 NOTE — Telephone Encounter (Signed)
Be active, Stay on a low carb diet Thx

## 2022-03-04 NOTE — Telephone Encounter (Signed)
Pt called in requesting advice for elevated glucose on lab results. She would like to know what can she do to lower it.   Please advise  CB: [phone]

## 2022-03-07 NOTE — Telephone Encounter (Signed)
Called pt no answer LMOM w/MD response../lmb 

## 2022-03-11 ENCOUNTER — Ambulatory Visit: Payer: Medicare Other | Admitting: Radiology

## 2022-03-11 VITALS — BP 126/82

## 2022-03-11 DIAGNOSIS — R300 Dysuria: Secondary | ICD-10-CM | POA: Diagnosis not present

## 2022-03-11 DIAGNOSIS — N3091 Cystitis, unspecified with hematuria: Secondary | ICD-10-CM | POA: Diagnosis not present

## 2022-03-11 MED ORDER — SULFAMETHOXAZOLE-TRIMETHOPRIM 800-160 MG PO TABS: 1.0000 | ORAL_TABLET | Freq: Two times a day (BID) | ORAL | 0 refills | Status: DC

## 2022-03-11 NOTE — Progress Notes (Signed)
      SUBJECTIVE: Sharon Paul is a 76 y.o. female who complains of  dysuria, frequency, urgency (x's 1-2 weeks noticed after intercourse-- taking otc ultricalm) (husband is burning with urination and is taking ultricalm as well)  OBJECTIVE: Appears well, in no apparent distress.  Vital signs are normal. The abdomen is soft without tenderness, guarding, mass, rebound or organomegaly. No CVA tenderness or inguinal adenopathy noted. Urine dipstick shows positive for RBC's, positive for protein, positive for nitrates, and positive for leukocytes.  Micro exam: >60 WBC's per HPF, 3-10 RBC's per HPF, and few+ bacteria.   Chaperone offered and declined for exam.  ASSESSMENT/PLAN:  1. Dysuria  - Urinalysis,Complete w/RFL Culture  2. Cystitis with hematuria  - sulfamethoxazole-trimethoprim (BACTRIM DS) 800-160 MG tablet; Take 1 tablet by mouth 2 (two) times daily.  Dispense: 10 tablet; Refill: 0  -continue vaginal estrogen -may need to consider post coital antibiotics if this happened again soon  Will send urine culture and sensitivity.  Treatment per orders - also push fluids, avoid bladder irritants. Instructed she may use Pyridium OTC prn. Call or return to clinic prn if these symptoms worsen or fail to improve as anticipated. Pyelo precautions reviewed with patient.

## 2022-03-14 LAB — URINE CULTURE
MICRO NUMBER:: 13648388
SPECIMEN QUALITY:: ADEQUATE

## 2022-03-14 LAB — URINALYSIS, COMPLETE W/RFL CULTURE
Bilirubin Urine: NEGATIVE
Casts: NONE SEEN /LPF
Crystals: NONE SEEN /HPF
Glucose, UA: NEGATIVE
Hyaline Cast: NONE SEEN /LPF
Ketones, ur: NEGATIVE
Nitrites, Initial: POSITIVE — AB
Specific Gravity, Urine: 1.02 (ref 1.001–1.035)
WBC, UA: >=60 /HPF — AB (ref 0–5)
Yeast: NONE SEEN /HPF
pH: 5.5 (ref 5.0–8.0)

## 2022-03-14 LAB — CULTURE INDICATED

## 2022-03-15 ENCOUNTER — Other Ambulatory Visit: Payer: Self-pay

## 2022-03-15 ENCOUNTER — Telehealth: Payer: Self-pay

## 2022-03-15 DIAGNOSIS — N952 Postmenopausal atrophic vaginitis: Secondary | ICD-10-CM

## 2022-03-15 MED ORDER — NONFORMULARY OR COMPOUNDED ITEM: 1 refills | Status: DC

## 2022-03-15 NOTE — Telephone Encounter (Signed)
Rx phoned in to [facility].

## 2022-03-15 NOTE — Telephone Encounter (Signed)
Patient was in in April for problem visit with Dr. Quincy Simmonds and Rx for Compound Estradiol cream was [facility] to [facility] and not to [facility]. She is out and needs to get is asap and needs Rx resent to [facility]   AEX 07/21/2021 with TW.

## 2022-03-15 NOTE — Telephone Encounter (Signed)
Refill provided. Looks like they requested phone in.

## 2022-03-29 ENCOUNTER — Telehealth: Payer: Self-pay | Admitting: Internal Medicine

## 2022-03-29 NOTE — Telephone Encounter (Signed)
Pt c/o medication issue:  1. Name of Medication: Evolocumab (REPATHA SURECLICK) 097 MG/ML SOAJ  2. How are you currently taking this medication (dosage and times per day)? Inject 1 pen into the skin every 14 (fourteen) days.  3. Are you having a reaction (difficulty breathing--STAT)? no  4. What is your medication issue? Patient calling in because she is unable to afford this medication. Calling to see what other option are there. Please advise

## 2022-03-30 NOTE — Telephone Encounter (Signed)
Recommend patient download and complete form for [employer] to apply for patient assistance

## 2022-03-30 NOTE — Telephone Encounter (Signed)
Left a message for the pt to call back.  

## 2022-03-31 ENCOUNTER — Telehealth: Payer: Self-pay | Admitting: Internal Medicine

## 2022-03-31 NOTE — Telephone Encounter (Signed)
Pt c/o medication issue:  1. Name of Medication: Evolocumab (REPATHA SURECLICK) 728 MG/ML SOAJ  2. How are you currently taking this medication (dosage and times per day)?   3. Are you having a reaction (difficulty breathing--STAT)? no  4. What is your medication issue? Pt called stating that no one has returned her call about this situation wit her no being able to afford medication. Informed her that Alden Hipp, RN called yesterday at 9:04am however no answer. I informed her of [employer] psych[employer] Patient Assistance form.   She states she would really like to know what other medication can she take other than Repatha. Please Advise.     Best callback: [phone]

## 2022-03-31 NOTE — Telephone Encounter (Signed)
Will forward this message to our PharmD team for further assistance with Repatha concerns.

## 2022-04-01 ENCOUNTER — Other Ambulatory Visit: Payer: Self-pay | Admitting: Internal Medicine

## 2022-04-01 NOTE — Telephone Encounter (Signed)
The only cheaper option would be ezetimibe, but it wont be nearly as effective as Repatha. She has a medicare advantage plan so Leqvio would be about 1200 per injection. She is on the max tolerated dose of statin. Currently, the only option cost wise to help with Repatha would be to submitted for pt assistance.  Called pt and LVM for her to call back

## 2022-04-04 NOTE — Telephone Encounter (Signed)
Left another message for the pt to call back... will close this encounter until the pt calls back.   My Chart message sent to the pt as well.

## 2022-04-09 ENCOUNTER — Other Ambulatory Visit: Payer: Self-pay | Admitting: Internal Medicine

## 2022-04-11 NOTE — Telephone Encounter (Signed)
Left another message for pt. Will await her return call back at this time as 3 attempts have been made to reach pt.

## 2022-04-11 NOTE — Telephone Encounter (Signed)
Check [place] registry last filled 03/18/2022.Marland KitchenJohny Paul

## 2022-04-18 ENCOUNTER — Other Ambulatory Visit: Payer: Self-pay | Admitting: Internal Medicine

## 2022-04-23 ENCOUNTER — Other Ambulatory Visit: Payer: Self-pay | Admitting: Internal Medicine

## 2022-04-27 ENCOUNTER — Other Ambulatory Visit: Payer: Self-pay | Admitting: Internal Medicine

## 2022-04-27 MED ORDER — ROSUVASTATIN CALCIUM 10 MG PO TABS: 5.0000 mg | ORAL_TABLET | ORAL | 2 refills | Status: DC

## 2022-04-28 ENCOUNTER — Ambulatory Visit (INDEPENDENT_AMBULATORY_CARE_PROVIDER_SITE_OTHER): Payer: Medicare Other | Admitting: Internal Medicine

## 2022-04-28 ENCOUNTER — Encounter: Payer: Self-pay | Admitting: Internal Medicine

## 2022-04-28 VITALS — BP 142/72 | HR 61 | Temp 98.4°F | Ht <= 58 in | Wt 125.8 lb

## 2022-04-28 DIAGNOSIS — Z23 Encounter for immunization: Secondary | ICD-10-CM

## 2022-04-28 DIAGNOSIS — M25511 Pain in right shoulder: Secondary | ICD-10-CM

## 2022-04-28 DIAGNOSIS — G8929 Other chronic pain: Secondary | ICD-10-CM | POA: Diagnosis not present

## 2022-04-28 DIAGNOSIS — I10 Essential (primary) hypertension: Secondary | ICD-10-CM | POA: Diagnosis not present

## 2022-04-28 DIAGNOSIS — F411 Generalized anxiety disorder: Secondary | ICD-10-CM

## 2022-04-28 NOTE — Assessment & Plan Note (Signed)
Continue on Prozac, Use  Lorazepam prn  Potential benefits of a long term benzodiazepines  use as well as potential risks  and complications were explained to the patient and were aknowledged.

## 2022-04-28 NOTE — Assessment & Plan Note (Signed)
D/c Meloxicam for R shoulder pain. C/o R shoulder pain (7/10) - seeing Dr Onnie Graham - needs replacement - ?October 2023 Use Ultracet prn 1/2-1 po tid prn

## 2022-04-28 NOTE — Assessment & Plan Note (Signed)
D/c Meloxicam  Treat R shoulder pain Use Ultracet prn 1/2-1 po tid prn Monitor BP

## 2022-04-28 NOTE — Addendum Note (Signed)
Addended by: Earnstine Regal on: 04/28/2022 12:23 PM   Modules accepted: Orders

## 2022-04-28 NOTE — Progress Notes (Signed)
Subjective:  Patient ID: Sharon Paul, female    DOB: Apr 15, 1946  Age: 76 y.o. MRN: [id number]  CC: Hypertension (Pt states BP been elevated due to (R) shoulder pain)   HPI Winda Summerall presents for HTN with BP 162/89 on Meloxicam for R shoulder pain. Pt stopped Meloxicam 1 week ago... C/o R shoulder pain (7/10) - seeing Dr Onnie Graham - needs replacement.  Outpatient Medications Prior to Visit  Medication Sig Dispense Refill   acetaminophen (TYLENOL) 500 MG tablet Take 500 mg by mouth every 6 (six) hours as needed.     alum & mag hydroxide-simeth (MAALOX/MYLANTA) 200-200-20 MG/5ML suspension Take 15 mLs by mouth every 6 (six) hours as needed for indigestion or heartburn.     aspirin 81 MG tablet Take 81 mg by mouth daily.     Bacillus Coagulans-Inulin (ALIGN PREBIOTIC-PROBIOTIC PO) Take 1 capsule by mouth. Takes for 7 days at a time if needed     Calcium-Vitamin D-Vitamin K (VIACTIV CALCIUM PLUS D PO) Take 1 tablet by mouth daily.     Cholecalciferol 2000 UNITS TABS Take 1 tablet by mouth daily.     diazepam (VALIUM) 5 MG tablet Take 5 mg by mouth 2 (two) times daily as needed.     Evolocumab (REPATHA SURECLICK) 601 MG/ML SOAJ Inject 1 pen into the skin every 14 (fourteen) days. 6 mL 3   fexofenadine (ALLEGRA) 180 MG tablet Take 180 mg by mouth daily.     FLUoxetine (PROZAC) 10 MG capsule TAKE 1 CAPSULE BY MOUTH  DAILY 90 capsule 3   fluticasone (FLONASE) 50 MCG/ACT nasal spray USE 2 SPRAYS NASALLY DAILY 48 g 3   ipratropium (ATROVENT) 0.06 % nasal spray PLACE 2 SPRAYS INTO THE NOSE 3 (THREE) TIMES DAILY.     irbesartan (AVAPRO) 300 MG tablet Take 0.5 tablets (150 mg total) by mouth 2 (two) times daily. 90 tablet 3   labetalol (NORMODYNE) 300 MG tablet TAKE 1 TABLET BY MOUTH  TWICE DAILY 180 tablet 3   LORazepam (ATIVAN) 0.5 MG tablet TAKE 1 TABLET BY MOUTH TWICE A DAY 60 tablet 3   Multiple Vitamins-Minerals (MULTIVITAMIN WOMEN 50+) TABS Take 1 tablet by mouth daily.      NONFORMULARY OR COMPOUNDED ITEM compounded vaginal estradiol cream 0.02% insert 1 gram vaginally twice a week at bedtime.  Disp:  60 grams. 60 each 1   pantoprazole (PROTONIX) 40 MG tablet TAKE 1 TABLET BY MOUTH  DAILY ANNUAL APPOINTMENT  DUE IN FEB 2023 MUST SEE  PROVIDER FOR FUTURE REFILLS 90 tablet 3   rosuvastatin (CRESTOR) 10 MG tablet Take 0.5 tablets (5 mg total) by mouth every other day. 8 tablet 2   traMADol-acetaminophen (ULTRACET) 37.5-325 MG tablet TAKE 1 TABLET BY MOUTH EVERY 6 HOURS AS NEEDED 100 tablet 1   valACYclovir (VALTREX) 1000 MG tablet Take 2 tablets at the earliest onset of cold sore symptoms.  Repeat with 2 tablets 12 hours later 12 tablet 1   meloxicam (MOBIC) 7.5 MG tablet Take 7.5 mg by mouth 2 (two) times daily as needed.     labetalol (NORMODYNE) 300 MG tablet Take 1 tablet by mouth 2 (two) times daily. (Patient not taking: Reported on 04/28/2022)     PFIZER COVID-19 VAC BIVALENT injection  (Patient not taking: Reported on 03/11/2022)     sulfamethoxazole-trimethoprim (BACTRIM DS) 800-160 MG tablet Take 1 tablet by mouth 2 (two) times daily. (Patient not taking: Reported on 04/28/2022) 10 tablet 0   No facility-administered medications prior to  visit.    ROS: Review of Systems  Constitutional:  Negative for activity change, appetite change, chills, fatigue and unexpected weight change.  HENT:  Negative for congestion, mouth sores and sinus pressure.   Eyes:  Negative for visual disturbance.  Respiratory:  Negative for cough and chest tightness.   Gastrointestinal:  Negative for abdominal pain and nausea.  Genitourinary:  Negative for difficulty urinating, frequency and vaginal pain.  Musculoskeletal:  Positive for arthralgias. Negative for back pain and gait problem.  Skin:  Negative for pallor and rash.  Neurological:  Negative for dizziness, tremors, weakness, numbness and headaches.  Psychiatric/Behavioral:  Negative for confusion and sleep disturbance. The  patient is nervous/anxious.     Objective:  BP (!) 142/72 (BP Location: Left Arm)   Pulse 61   Temp 98.4 F (36.9 C) (Oral)   Ht '4\' 9"'$  (1.448 m)   Wt 125 lb 12.8 oz (57.1 kg)   SpO2 97%   BMI 27.22 kg/m   BP Readings from Last 3 Encounters:  04/28/22 (!) 142/72  03/11/22 126/82  02/14/22 120/80    Wt Readings from Last 3 Encounters:  04/28/22 125 lb 12.8 oz (57.1 kg)  02/14/22 129 lb (58.5 kg)  12/13/21 129 lb (58.5 kg)    Physical Exam Constitutional:      General: She is not in acute distress.    Appearance: Normal appearance. She is well-developed.  HENT:     Head: Normocephalic.     Right Ear: External ear normal.     Left Ear: External ear normal.     Nose: Nose normal.  Eyes:     General:        Right eye: No discharge.        Left eye: No discharge.     Conjunctiva/sclera: Conjunctivae normal.     Pupils: Pupils are equal, round, and reactive to light.  Neck:     Thyroid: No thyromegaly.     Vascular: No JVD.     Trachea: No tracheal deviation.  Cardiovascular:     Rate and Rhythm: Normal rate and regular rhythm.     Heart sounds: Normal heart sounds.  Pulmonary:     Effort: No respiratory distress.     Breath sounds: No stridor. No wheezing.  Abdominal:     General: Bowel sounds are normal. There is no distension.     Palpations: Abdomen is soft. There is no mass.     Tenderness: There is no abdominal tenderness. There is no guarding or rebound.  Musculoskeletal:        General: Tenderness present.     Cervical back: Normal range of motion and neck supple. No rigidity.  Lymphadenopathy:     Cervical: No cervical adenopathy.  Skin:    Findings: No erythema or rash.  Neurological:     Cranial Nerves: No cranial nerve deficit.     Motor: No abnormal muscle tone.     Coordination: Coordination normal.     Deep Tendon Reflexes: Reflexes normal.  Psychiatric:        Behavior: Behavior normal.        Thought Content: Thought content normal.         Judgment: Judgment normal.   R shoulder - pain w/ROM ROM is decreased  Lab Results  Component Value Date   WBC 3.6 (L) 10/28/2021   HGB 12.8 10/28/2021   HCT 39.0 10/28/2021   PLT 251.0 10/28/2021   GLUCOSE 103 (H) 02/21/2022   CHOL  118 02/21/2022   TRIG 134.0 02/21/2022   HDL 62.70 02/21/2022   LDLDIRECT 138.2 10/22/2010   LDLCALC 28 02/21/2022   ALT 19 02/21/2022   AST 19 02/21/2022   NA 140 02/21/2022   K 4.3 02/21/2022   CL 106 02/21/2022   CREATININE 0.85 02/21/2022   BUN 11 02/21/2022   CO2 28 02/21/2022   TSH 1.73 10/28/2021   HGBA1C 6.1 10/28/2021    No results found.  Assessment & Plan:   Problem List Items Addressed This Visit     Essential hypertension    D/c Meloxicam  Treat R shoulder pain Use Ultracet prn 1/2-1 po tid prn Monitor BP      Generalized anxiety disorder    Continue on Prozac, Use  Lorazepam prn  Potential benefits of a long term benzodiazepines  use as well as potential risks  and complications were explained to the patient and were aknowledged.      Right shoulder pain    D/c Meloxicam for R shoulder pain. C/o R shoulder pain (7/10) - seeing Dr Onnie Graham - needs replacement - ?October 2023 Use Ultracet prn 1/2-1 po tid prn         No orders of the defined types were placed in this encounter.     Follow-up: Return in about 3 months (around 07/28/2022) for a follow-up visit.  Walker Kehr, MD

## 2022-04-28 NOTE — Patient Instructions (Signed)
Stop Meloxicam  Treat shoulder pain: Use Ultracet 1/2-1 tablet  three times a day as needed Monitor BP Blue-Emu cream use 2-3 times a day

## 2022-05-25 DIAGNOSIS — M25811 Other specified joint disorders, right shoulder: Secondary | ICD-10-CM | POA: Diagnosis not present

## 2022-05-26 ENCOUNTER — Telehealth: Payer: Self-pay

## 2022-05-26 NOTE — Telephone Encounter (Signed)
   Pre-operative Risk Assessment    Patient Name: Sharon Paul  DOB: 09-25-1945 MRN: [id number]      Request for Surgical Clearance    Procedure:   RIGHT SHOULDER REVERSE ARTHROPLASTY  Date of Surgery:  Clearance TBD                                 Surgeon:  DR. Lennette Bihari SUPPLE Surgeon's Group or Practice Name:  [facility] Phone number:  [phone] Fax number:  [phone]   Type of Clearance Requested:   - Medical  - Pharmacy:  Hold Aspirin NEEDS INSTRUCTIONS    Type of Anesthesia:  General    Additional requests/questions:    SignedJacinta Shoe   05/26/2022, 12:43 PM

## 2022-05-26 NOTE — Telephone Encounter (Signed)
   Name: Vylette Strubel  DOB: 1946/04/05  MRN: [id number]  Primary Cardiologist: None  Chart reviewed as part of pre-operative protocol coverage. Because of Paislea Sanchez-Grateron's past medical history and time since last visit, she will require a follow-up in-office visit in order to better assess preoperative cardiovascular risk. Pt has not been seen in over a year.   Pre-op covering staff: - Please schedule appointment and call patient to inform them. If patient already had an upcoming appointment within acceptable timeframe, please add "pre-op clearance" to the appointment notes so provider is aware. - Please contact requesting surgeon's office via preferred method (i.e, phone, fax) to inform them of need for appointment prior to surgery.  Lenna Sciara, NP  05/26/2022, 12:52 PM

## 2022-05-26 NOTE — Telephone Encounter (Signed)
I left a message for the patient to call our office back to schedule an appointment for pre-op.

## 2022-05-27 NOTE — Telephone Encounter (Signed)
Pt is scheduled to see Richardson Dopp PA-C on 06/01/22 for preop clearance.

## 2022-05-31 DIAGNOSIS — Z01810 Encounter for preprocedural cardiovascular examination: Secondary | ICD-10-CM | POA: Insufficient documentation

## 2022-05-31 DIAGNOSIS — Z1211 Encounter for screening for malignant neoplasm of colon: Secondary | ICD-10-CM | POA: Insufficient documentation

## 2022-05-31 NOTE — Progress Notes (Unsigned)
Cardiology Office Note:    Date:  06/01/2022   ID:  Sharon Paul, DOB 1946/06/02, MRN [id number]  PCP:  Cassandria Anger, MD  [facility] Cardiologist:  Dorris Carnes, MD     Referring MD: Cassandria Anger, MD   Chief Complaint:  surgical clearance    Patient Profile: Carotid artery disease Sharon Paul: LICA 70-96 Hyperlipidemia  Intol of statins Celiac sprue GERD Coronary Ca2+ (CT in 2007)  Prior CV Studies:   CAROTID Sharon 01/01/21 L ICA 40-59  History of Present Illness:   Sharon Paul is a 76 y.o. female with the above problem list.  She was last seen by Dr. Harrington Challenger in 5/22. She returns for surgical clearance. She needs to undergo R reverse shoulder replacement with Dr. Onnie Graham under gen anesthesia. She is here alone. She is doing well w/o chest pain, shortness of breath, syncope, orthopnea, leg edema. BP at home is usually 120's-130s/70s. She did have a high salt meal yesterday and has not had her medications for BP yet today.        Past Medical History:  Diagnosis Date   Celiac sprue 2007   CVD (cardiovascular disease)    Foot fracture, left 2011   GERD (gastroesophageal reflux disease)    Hyperlipidemia    Osteopenia 04/2019   T score -1.9 FRAX 10% / 1.9%.  Stable from prior DEXA   PVD (peripheral vascular disease) (HCC)    mild carotid ? fibromuscular dysplasia   Shingles    Urinary incontinence    Current Medications: Current Meds  Medication Sig   acetaminophen (TYLENOL) 500 MG tablet Take 500 mg by mouth every 6 (six) hours as needed.   alum & mag hydroxide-simeth (MAALOX/MYLANTA) 200-200-20 MG/5ML suspension Take 15 mLs by mouth every 6 (six) hours as needed for indigestion or heartburn.   aspirin 81 MG tablet Take 81 mg by mouth daily.   Calcium-Vitamin D-Vitamin K (VIACTIV CALCIUM PLUS D PO) Take 1 tablet by mouth daily.   Cholecalciferol 2000 UNITS TABS Take 1 tablet by mouth daily.   diazepam (VALIUM) 5 MG tablet  Take 5 mg by mouth 2 (two) times daily as needed.   Evolocumab (REPATHA SURECLICK) 283 MG/ML SOAJ Inject 1 pen into the skin every 14 (fourteen) days.   fexofenadine (ALLEGRA) 180 MG tablet Take 180 mg by mouth daily.   FLUoxetine (PROZAC) 10 MG capsule TAKE 1 CAPSULE BY MOUTH  DAILY   fluticasone (FLONASE) 50 MCG/ACT nasal spray USE 2 SPRAYS NASALLY DAILY   ipratropium (ATROVENT) 0.06 % nasal spray PLACE 2 SPRAYS INTO THE NOSE 3 (THREE) TIMES DAILY.   irbesartan (AVAPRO) 300 MG tablet Take 0.5 tablets (150 mg total) by mouth 2 (two) times daily.   labetalol (NORMODYNE) 300 MG tablet TAKE 1 TABLET BY MOUTH  TWICE DAILY   LORazepam (ATIVAN) 0.5 MG tablet TAKE 1 TABLET BY MOUTH TWICE A DAY   Multiple Vitamins-Minerals (MULTIVITAMIN WOMEN 50+) TABS Take 1 tablet by mouth daily.   NONFORMULARY OR COMPOUNDED ITEM compounded vaginal estradiol cream 0.02% insert 1 gram vaginally twice a week at bedtime.  Disp:  60 grams.   pantoprazole (PROTONIX) 40 MG tablet TAKE 1 TABLET BY MOUTH  DAILY ANNUAL APPOINTMENT  DUE IN FEB 2023 MUST SEE  PROVIDER FOR FUTURE REFILLS   rosuvastatin (CRESTOR) 10 MG tablet Take 0.5 tablets (5 mg total) by mouth every other day.   traMADol-acetaminophen (ULTRACET) 37.5-325 MG tablet TAKE 1 TABLET BY MOUTH EVERY 6 HOURS AS  NEEDED   valACYclovir (VALTREX) 1000 MG tablet Take 2 tablets at the earliest onset of cold sore symptoms.  Repeat with 2 tablets 12 hours later    Allergies:   Amlodipine, Augmentin [amoxicillin-pot clavulanate], Clarithromycin, Codeine, Esomeprazole magnesium, Hydrochlorothiazide, Iohexol, Kapidex [dexlansoprazole], Losartan potassium, Olmesartan medoxomil, Ramipril, and Risedronate sodium   Social History   Tobacco Use   Smoking status: Former    Types: Cigarettes    Quit date: 02/11/1970    Years since quitting: 52.3   Smokeless tobacco: Never  Vaping Use   Vaping Use: Never used  Substance Use Topics   Alcohol use: Yes    Alcohol/week: 0.0  standard drinks of alcohol    Comment: very rare   Drug use: No    Family Hx: The patient's family history includes Glaucoma in her brother and mother; Heart disease in her father and mother; Hypertension in her brother, father, mother, paternal grandmother, and sister; Lung cancer in her paternal grandmother; Stroke in her father. There is no history of Colon cancer.  ROS   EKGs/Labs/Other Test Reviewed:    EKG:  EKG is  ordered today.  The ekg ordered today demonstrates sinus brady, HR 58, normal axis, non-specific ST-TW changes, QTc  388 ms   Recent Labs: 10/28/2021: Hemoglobin 12.8; Platelets 251.0; TSH 1.73 02/21/2022: ALT 19; BUN 11; Creatinine, Ser 0.85; Potassium 4.3; Sodium 140   Recent Lipid Panel Recent Labs    02/21/22 0947  CHOL 118  TRIG 134.0  HDL 62.70  VLDL 26.8  LDLCALC 28     Risk Assessment/Calculations/Metrics:         HYPERTENSION CONTROL Vitals:   06/01/22 1001 06/01/22 1809  BP: (!) 160/62 (!) 176/90    The patient's blood pressure is elevated above target today.  In order to address the patient's elevated BP: Blood pressure will be monitored at home to determine if medication changes need to be made.       Physical Exam:    VS:  BP (!) 176/90   Pulse (!) 58   Ht '4\' 11"'$  (1.499 m)   Wt 125 lb 9.6 oz (57 kg)   SpO2 98%   BMI 25.37 kg/m     Wt Readings from Last 3 Encounters:  06/01/22 125 lb 9.6 oz (57 kg)  04/28/22 125 lb 12.8 oz (57.1 kg)  02/14/22 129 lb (58.5 kg)    Constitutional:      Appearance: Healthy appearance. Not in distress.  Neck:     Vascular: JVD normal.  Pulmonary:     Effort: Pulmonary effort is normal.     Breath sounds: No wheezing. No rales.  Cardiovascular:     Normal rate. Regular rhythm. Normal S1. Normal S2.      Murmurs: There is no murmur.  Edema:    Peripheral edema absent.  Abdominal:     Palpations: Abdomen is soft.  Skin:    General: Skin is warm and dry.  Neurological:     Mental Status:  Alert and oriented to person, place and time.          ASSESSMENT & PLAN:   Preoperative cardiovascular examination [name]'s perioperative risk of a major cardiac event is 0.4% according to the Revised Cardiac Risk Index (RCRI).  Therefore, she is at low risk for perioperative complications.   Her functional capacity is good at 4.31 METs according to the [place] Activity Status Index (DASI). Recommendations: According to ACC/AHA guidelines, no further cardiovascular testing needed.  The patient  may proceed to surgery at acceptable risk.   Antiplatelet and/or Anticoagulation Recommendations: Aspirin can be held for 7 days prior to her surgery.  Please resume Aspirin post operatively when it is felt to be safe from a bleeding standpoint.    Essential hypertension BP above target. She has not had her meds today. She will take them when she gets home. She also had a salty meal yesterday. I have asked her to monitor her BP for a week and send those to me through [facility].  Continue irbesartan 150 mg twice daily, labetalol 300 mg twice daily.  If blood pressure remains above target, adjust your valsartan.  Carotid artery disease ([place]) Ultrasound in 2022 with 40-59% left ICA stenosis.  Continue aspirin, statin therapy.  Arrange follow-up carotid Sharon.  Dyslipidemia LDL in June 2023 was optimal at 28.  Continue Crestor 5 mg every other day and Repatha 140 mg 14 days.            Dispo:  Return in about 1 year (around 06/02/2023) for Routine Follow Up, w/ Dr. Harrington Challenger.   Medication Adjustments/Labs and Tests Ordered: Current medicines are reviewed at length with the patient today.  Concerns regarding medicines are outlined above.  Tests Ordered: Orders Placed This Encounter  Procedures   EKG 12-Lead   VAS Sharon CAROTID   Medication Changes: No orders of the defined types were placed in this encounter.  Signed, Richardson Dopp, PA-C  06/01/2022 6:20 PM    [address], [place], [place]  [address] Phone: [phone]; Fax: [phone]

## 2022-06-01 ENCOUNTER — Encounter: Payer: Self-pay | Admitting: Physician Assistant

## 2022-06-01 ENCOUNTER — Ambulatory Visit: Payer: Medicare Other | Attending: Internal Medicine | Admitting: Physician Assistant

## 2022-06-01 VITALS — BP 176/90 | HR 58 | Ht 59.0 in | Wt 125.6 lb

## 2022-06-01 DIAGNOSIS — E785 Hyperlipidemia, unspecified: Secondary | ICD-10-CM

## 2022-06-01 DIAGNOSIS — I10 Essential (primary) hypertension: Secondary | ICD-10-CM

## 2022-06-01 DIAGNOSIS — Z01810 Encounter for preprocedural cardiovascular examination: Secondary | ICD-10-CM

## 2022-06-01 DIAGNOSIS — I6522 Occlusion and stenosis of left carotid artery: Secondary | ICD-10-CM

## 2022-06-01 NOTE — Patient Instructions (Signed)
Medication Instructions:  Your physician recommends that you continue on your current medications as directed. Please refer to the Current Medication list given to you today.  *If you need a refill on your cardiac medications before your next appointment, please call your pharmacy*   Lab Work: None ordered  If you have labs (blood work) drawn today and your tests are completely normal, you will receive your results only by: [place] (if you have MyChart) OR A paper copy in the mail If you have any lab test that is abnormal or we need to change your treatment, we will call you to review the results.   Testing/Procedures: Your physician has requested that you have a carotid duplex. This test is an ultrasound of the carotid arteries in your neck. It looks at blood flow through these arteries that supply the brain with blood. Allow one hour for this exam. There are no restrictions or special instructions.    Follow-Up: At [facility], you and your health needs are our priority.  As part of our continuing mission to provide you with exceptional heart care, we have created designated Provider Care Teams.  These Care Teams include your primary Cardiologist (physician) and Advanced Practice Providers (APPs -  Physician Assistants and Nurse Practitioners) who all work together to provide you with the care you need, when you need it.  We recommend signing up for the patient portal called "MyChart".  Sign up information is provided on this After Visit Summary.  MyChart is used to connect with patients for Virtual Visits (Telemedicine).  Patients are able to view lab/test results, encounter notes, upcoming appointments, etc.  Non-urgent messages can be sent to your provider as well.   To learn more about what you can do with MyChart, go to [email or web address].    Your next appointment:   1 year(s)  The format for your next appointment:   In Person  Provider:   Dorris Carnes, MD    Other Instructions You may hold your Aspirin 7 days prior to your surgery and resume afterwards, when surgeon's says it's ok.  Your physician has requested that you regularly monitor and record your blood pressure readings at home. Please use the same machine at the same time of day to check your readings and record them to bring to your follow-up visit.   Please monitor blood pressures and keep a log of your readings for 1 week then call or message with those readings..    Make sure to check 2 hours after your medications.    AVOID these things for 30 minutes before checking your blood pressure: No Drinking caffeine. No Drinking alcohol. No Eating. No Smoking. No Exercising.   Five minutes before checking your blood pressure: Pee. Sit in a dining chair. Avoid sitting in a soft couch or armchair. Be quiet. Do not talk   Important Information About Sugar

## 2022-06-01 NOTE — Assessment & Plan Note (Signed)
BP above target. She has not had her meds today. She will take them when she gets home. She also had a salty meal yesterday. I have asked her to monitor her BP for a week and send those to me through [place].  Continue irbesartan 150 mg twice daily, labetalol 300 mg twice daily.  If blood pressure remains above target, adjust your valsartan.

## 2022-06-01 NOTE — Assessment & Plan Note (Signed)
LDL in June 2023 was optimal at 28.  Continue Crestor 5 mg every other day and Repatha 140 mg 14 days.

## 2022-06-01 NOTE — Assessment & Plan Note (Signed)
Ultrasound in 2022 with 40-59% left ICA stenosis.  Continue aspirin, statin therapy.  Arrange follow-up carotid US.

## 2022-06-01 NOTE — Assessment & Plan Note (Addendum)
Ms. Werntz perioperative risk of a major cardiac event is 0.4% according to the Revised Cardiac Risk Index (RCRI).  Therefore, she is at low risk for perioperative complications.   Her functional capacity is good at 4.31 METs according to the Duke Activity Status Index (DASI). Recommendations: According to ACC/AHA guidelines, no further cardiovascular testing needed.  The patient may proceed to surgery at acceptable risk.   Antiplatelet and/or Anticoagulation Recommendations: Aspirin can be held for 7 days prior to her surgery.  Please resume Aspirin post operatively when it is felt to be safe from a bleeding standpoint.

## 2022-06-06 ENCOUNTER — Ambulatory Visit (HOSPITAL_COMMUNITY)
Admission: RE | Admit: 2022-06-06 | Discharge: 2022-06-06 | Disposition: A | Discharge status: Home / Self Care | Payer: Medicare Other | Source: Ambulatory Visit | Attending: Internal Medicine | Admitting: Internal Medicine

## 2022-06-06 DIAGNOSIS — I6522 Occlusion and stenosis of left carotid artery: Secondary | ICD-10-CM | POA: Insufficient documentation

## 2022-06-07 ENCOUNTER — Telehealth: Payer: Self-pay | Admitting: Internal Medicine

## 2022-06-07 ENCOUNTER — Telehealth: Payer: Self-pay | Admitting: *Deleted

## 2022-06-07 DIAGNOSIS — I6522 Occlusion and stenosis of left carotid artery: Secondary | ICD-10-CM

## 2022-06-07 NOTE — Telephone Encounter (Signed)
-----   Message from Liliane Shi, [place] sent at 06/06/2022  9:59 PM EDT ----- US shows elevated velocities in the left carotid artery but it appears to be due to kink in vessel. There is no evidence of plaque. She does not appear to have blockages in her neck arteries. PLAN:  -Continue current medications/treatment plan and follow up as scheduled.  -Schedule repeat US in 3 years. -Send copy to PCP.  Richardson Dopp, PA-C    06/06/2022 9:50 PM

## 2022-06-07 NOTE — Telephone Encounter (Signed)
Patient is having blood pressure issues - She made an appointment with Dr. Jenny Reichmann for this Friday - she would like to know if there are things that she can do to help lower her blood pressure until her appointment.  Please advise.

## 2022-06-08 ENCOUNTER — Telehealth: Payer: Self-pay | Admitting: Internal Medicine

## 2022-06-08 MED ORDER — AMLODIPINE BESYLATE 2.5 MG PO TABS: 2.5000 mg | ORAL_TABLET | Freq: Every day | ORAL | 5 refills | Status: DC

## 2022-06-08 NOTE — Telephone Encounter (Signed)
We can try to add and low-dose amlodipine again (in the past she had a headache with that apparently).  I will send a prescription for 2.5 mg.  Keep office visit with Dr. Jenny Reichmann. What is her blood pressure?

## 2022-06-08 NOTE — Telephone Encounter (Signed)
Left message for patient to call back to schedule Medicare Annual Wellness Visit   Last AWV  06/14/21  Please schedule at anytime with [facility] if patient calls the office back.     Any questions, please call me at [phone]

## 2022-06-08 NOTE — Telephone Encounter (Signed)
Called pt no answer LMOM RTC.../lmb 

## 2022-06-09 NOTE — Telephone Encounter (Signed)
Called pt again still no answer LMOM w/ MD response.Marland KitchenJohny Paul

## 2022-06-10 ENCOUNTER — Ambulatory Visit (INDEPENDENT_AMBULATORY_CARE_PROVIDER_SITE_OTHER): Payer: Medicare Other | Admitting: Internal Medicine

## 2022-06-10 VITALS — BP 142/70 | HR 60 | Temp 97.8°F | Ht 59.0 in | Wt 127.0 lb

## 2022-06-10 DIAGNOSIS — I10 Essential (primary) hypertension: Secondary | ICD-10-CM

## 2022-06-10 DIAGNOSIS — Z23 Encounter for immunization: Secondary | ICD-10-CM | POA: Diagnosis not present

## 2022-06-10 DIAGNOSIS — F419 Anxiety disorder, unspecified: Secondary | ICD-10-CM | POA: Diagnosis not present

## 2022-06-10 DIAGNOSIS — M75101 Unspecified rotator cuff tear or rupture of right shoulder, not specified as traumatic: Secondary | ICD-10-CM

## 2022-06-10 DIAGNOSIS — R739 Hyperglycemia, unspecified: Secondary | ICD-10-CM

## 2022-06-10 MED ORDER — ALPRAZOLAM 0.25 MG PO TABS: 0.2500 mg | ORAL_TABLET | Freq: Two times a day (BID) | ORAL | 0 refills | Status: AC | PRN

## 2022-06-10 MED ORDER — AMLODIPINE BESYLATE 10 MG PO TABS: 10.0000 mg | ORAL_TABLET | Freq: Every day | ORAL | 3 refills | Status: DC

## 2022-06-10 NOTE — Patient Instructions (Signed)
You had the flu shot today  [place] for increased amlodipine to 10 mg per day  Please take all new medication as prescribed - the xanax as needed  Please continue all other medications as before, including the ultracet  Please have the pharmacy call with any other refills you may need.  Please continue your efforts at being more active, low cholesterol diet, and weight control.  Please keep your appointments with your specialists as you may have planned  Good Luck with your right shoulder surgury!

## 2022-06-10 NOTE — Progress Notes (Unsigned)
Patient ID: Sharon Paul, female   DOB: [birth year], 76 y.o.   MRN: [id number]        Chief Complaint: follow up HTN, chronic right shoulder pain, anxiety, hyperglycemia       HPI:  Sharon Paul is a 76 y.o. female here with 1 wk noticing BP as been SBP 160-200 often for unclear reasons, except anxiety and right shoulder pain due for shoulder reverse surgury soon per Dr Onnie Graham.  Concerned that she wont be able to qualify for surgury with elevated bp.  Pt denies chest pain, increased sob or doe, wheezing, orthopnea, PND, increased LE swelling, palpitations, dizziness or syncope.   Pt denies polydipsia, polyuria, or new focal neuro s/s.    Pt denies fever, wt loss, night sweats, loss of appetite, or other constitutional symptoms         Wt Readings from Last 3 Encounters:  06/10/22 127 lb (57.6 kg)  06/01/22 125 lb 9.6 oz (57 kg)  04/28/22 125 lb 12.8 oz (57.1 kg)   BP Readings from Last 3 Encounters:  06/10/22 (!) 142/70  06/01/22 (!) 176/90  04/28/22 (!) 142/72         Past Medical History:  Diagnosis Date   Celiac sprue 2007   CVD (cardiovascular disease)    Foot fracture, left 2011   GERD (gastroesophageal reflux disease)    Hyperlipidemia    Osteopenia 04/2019   T score -1.9 FRAX 10% / 1.9%.  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 Her smoking use included cigarettes. She has never used smokeless tobacco. She reports current alcohol use. She reports that she does not use drugs. family history includes Glaucoma in her brother  and mother; Heart disease in her father and mother; Hypertension in her brother, father, mother, paternal grandmother, and sister; Lung cancer in her paternal grandmother; Stroke in her father. Allergies  Allergen Reactions   Amlodipine     HA   Augmentin [Amoxicillin-Pot Clavulanate]     diarrhea   Clarithromycin     REACTION: gi upset. Can take Zpac   Codeine Nausea And Vomiting    Can take Prom-cod syr   Esomeprazole Magnesium     REACTION: side pain   Hydrochlorothiazide     REACTION: leg cramps, dehydration   Iohexol      Desc: PT HAS SWELLING TO FACE,LIPS AND THROAT WITH CONTRAST DYE    Kapidex [Dexlansoprazole]     rash   Losartan Potassium     REACTION: HA   Olmesartan Medoxomil     REACTION: weak legs   Ramipril     Per pt: unknown reaction   Risedronate Sodium     Per pt: unknown reaction   Current Outpatient Medications on File Prior to Visit  Medication Sig Dispense Refill   acetaminophen (TYLENOL) 500 MG tablet Take 500 mg by mouth every 6 (six) hours as needed.     alum & mag hydroxide-simeth (  MAALOX/MYLANTA) 200-200-20 MG/5ML suspension Take 15 mLs by mouth every 6 (six) hours as needed for indigestion or heartburn.     aspirin 81 MG tablet Take 81 mg by mouth daily.     Calcium-Vitamin D-Vitamin K (VIACTIV CALCIUM PLUS D PO) Take 1 tablet by mouth daily.     Cholecalciferol 2000 UNITS TABS Take 1 tablet by mouth daily.     diazepam (VALIUM) 5 MG tablet Take 5 mg by mouth 2 (two) times daily as needed.     Evolocumab (REPATHA SURECLICK) 536 MG/ML SOAJ Inject 1 pen into the skin every 14 (fourteen) days. 6 mL 3   fexofenadine (ALLEGRA) 180 MG tablet Take 180 mg by mouth daily.     FLUoxetine (PROZAC) 10 MG capsule TAKE 1 CAPSULE BY MOUTH  DAILY 90 capsule 3   fluticasone (FLONASE) 50 MCG/ACT nasal spray USE 2 SPRAYS NASALLY DAILY 48 g 3   ipratropium (ATROVENT) 0.06 % nasal spray PLACE 2 SPRAYS INTO THE NOSE 3 (THREE) TIMES DAILY.     irbesartan (AVAPRO) 300 MG  tablet Take 0.5 tablets (150 mg total) by mouth 2 (two) times daily. 90 tablet 3   labetalol (NORMODYNE) 300 MG tablet TAKE 1 TABLET BY MOUTH  TWICE DAILY 180 tablet 3   LORazepam (ATIVAN) 0.5 MG tablet TAKE 1 TABLET BY MOUTH TWICE A DAY 60 tablet 3   Multiple Vitamins-Minerals (MULTIVITAMIN WOMEN 50+) TABS Take 1 tablet by mouth daily.     NONFORMULARY OR COMPOUNDED ITEM compounded vaginal estradiol cream 0.02% insert 1 gram vaginally twice a week at bedtime.  Disp:  60 grams. 60 each 1   pantoprazole (PROTONIX) 40 MG tablet TAKE 1 TABLET BY MOUTH  DAILY ANNUAL APPOINTMENT  DUE IN FEB 2023 MUST SEE  PROVIDER FOR FUTURE REFILLS 90 tablet 3   rosuvastatin (CRESTOR) 10 MG tablet Take 0.5 tablets (5 mg total) by mouth every other day. 8 tablet 2   traMADol-acetaminophen (ULTRACET) 37.5-325 MG tablet TAKE 1 TABLET BY MOUTH EVERY 6 HOURS AS NEEDED 100 tablet 1   valACYclovir (VALTREX) 1000 MG tablet Take 2 tablets at the earliest onset of cold sore symptoms.  Repeat with 2 tablets 12 hours later 12 tablet 1   No current facility-administered medications on file prior to visit.        ROS:  All others reviewed and negative.  Objective        PE:  BP (!) 142/70 (BP Location: Left Arm, Patient Position: Sitting, Cuff Size: Large)   Pulse 60   Temp 97.8 F (36.6 C) (Oral)   Ht '4\' 11"'$  (1.499 m)   Wt 127 lb (57.6 kg)   SpO2 97%   BMI 25.65 kg/m                 Constitutional: Pt appears in NAD               HENT: Head: NCAT.                Right Ear: External ear normal.                 Left Ear: External ear normal.                Eyes: . Pupils are equal, round, and reactive to light. Conjunctivae and EOM are normal               Nose: without d/c or deformity  Neck: Neck supple. Gross normal ROM               Cardiovascular: Normal rate and regular rhythm.                 Pulmonary/Chest: Effort normal and breath sounds without rales or wheezing.                Abd:  Soft, NT,  ND, + BS, no organomegaly               Neurological: Pt is alert. At baseline orientation, motor grossly intact               Skin: Skin is warm. No rashes, no other new lesions, LE edema - none               Psychiatric: Pt behavior is normal without agitation   Micro: none  Cardiac tracings I have personally interpreted today:  none  Pertinent Radiological findings (summarize): none   Lab Results  Component Value Date   WBC 3.6 (L) 10/28/2021   HGB 12.8 10/28/2021   HCT 39.0 10/28/2021   PLT 251.0 10/28/2021   GLUCOSE 103 (H) 02/21/2022   CHOL 118 02/21/2022   TRIG 134.0 02/21/2022   HDL 62.70 02/21/2022   LDLDIRECT 138.2 10/22/2010   LDLCALC 28 02/21/2022   ALT 19 02/21/2022   AST 19 02/21/2022   NA 140 02/21/2022   K 4.3 02/21/2022   CL 106 02/21/2022   CREATININE 0.85 02/21/2022   BUN 11 02/21/2022   CO2 28 02/21/2022   TSH 1.73 10/28/2021   HGBA1C 6.1 10/28/2021   Assessment/Plan:  Sharon Paul is a 76 y.o. White or Caucasian [1] female with  has a past medical history of Celiac sprue (2007), CVD (cardiovascular disease), Foot fracture, left (2011), GERD (gastroesophageal reflux disease), Hyperlipidemia, Osteopenia (04/2019), PVD (peripheral vascular disease) ([place]), Shingles, and Urinary incontinence.  Tear of right rotator cuff Pt to continue plan to f/u with Dr Onnie Graham ortho for surgury as planned  Hyperglycemia Lab Results  Component Value Date   HGBA1C 6.1 10/28/2021   Stable, pt to continue current medical treatment  - diet, wt control, excercise   Essential hypertension BP Readings from Last 3 Encounters:  06/10/22 (!) 142/70  06/01/22 (!) 176/90  04/28/22 (!) 142/72   uncontrolled, pt to add norvasc 10 mg  Qd, low salt diet, wt control, excercise   Anxiety Situational, but likely affecting BP control - for limited rx xanax prn,  to f/u any worsening symptoms or concerns  Followup: Return if symptoms worsen or fail to improve.  Cathlean Cower, MD 06/11/2022 1:04 PM [facility]

## 2022-06-11 ENCOUNTER — Encounter: Payer: Self-pay | Admitting: Internal Medicine

## 2022-06-11 DIAGNOSIS — F419 Anxiety disorder, unspecified: Secondary | ICD-10-CM | POA: Insufficient documentation

## 2022-06-11 NOTE — Assessment & Plan Note (Signed)
BP Readings from Last 3 Encounters:  06/10/22 (!) 142/70  06/01/22 (!) 176/90  04/28/22 (!) 142/72   uncontrolled, pt to add norvasc 10 mg  Qd, low salt diet, wt control, excercise

## 2022-06-11 NOTE — Assessment & Plan Note (Signed)
Pt to continue plan to f/u with Dr Onnie Graham ortho for surgury as planned

## 2022-06-11 NOTE — Assessment & Plan Note (Signed)
Situational, but likely affecting BP control - for limited rx xanax prn,  to f/u any worsening symptoms or concerns

## 2022-06-11 NOTE — Assessment & Plan Note (Signed)
Lab Results  Component Value Date   HGBA1C 6.1 10/28/2021   Stable, pt to continue current medical treatment  - diet, wt control, excercise

## 2022-07-04 NOTE — Progress Notes (Incomplete)
COVID Vaccine Completed: yes  Date of COVID positive in last 90 days:  PCP - Lew Dawes, MD Cardiologist - Dorris Carnes, MD  Cardiac clearance by Richardson Dopp 06/01/22 in Epic  Chest x-ray -  EKG - 06/01/22 Epic Stress Test -  ECHO -  Cardiac Cath -  Pacemaker/ICD device last checked: Spinal Cord Stimulator:  Bowel Prep -   Sleep Study -  CPAP -   Fasting Blood Sugar -  Checks Blood Sugar _____ times a day  Last dose of GLP1 agonist-  N/A GLP1 instructions:  N/A   Last dose of SGLT-2 inhibitors-  N/A SGLT-2 instructions: N/A   Blood Thinner Instructions: Aspirin Instructions: ASA 81, hold 7 days  Last Dose:  Activity level:  Can go up a flight of stairs and perform activities of daily living without stopping and without symptoms of chest pain or shortness of breath.  Able to exercise without symptoms  Unable to go up a flight of stairs without symptoms of     Anesthesia review:   Patient denies shortness of breath, fever, cough and chest pain at PAT appointment  Patient verbalized understanding of instructions that were given to them at the PAT appointment. Patient was also instructed that they will need to review over the PAT instructions again at home before surgery.

## 2022-07-05 ENCOUNTER — Encounter (HOSPITAL_COMMUNITY)
Admission: RE | Admit: 2022-07-05 | Discharge: 2022-07-05 | Disposition: A | Discharge status: Home / Self Care | Payer: Medicare Other | Source: Ambulatory Visit | Attending: Orthopedic Surgery | Admitting: Orthopedic Surgery

## 2022-07-05 ENCOUNTER — Encounter (HOSPITAL_COMMUNITY): Payer: Self-pay

## 2022-07-05 VITALS — BP 125/59 | HR 64 | Temp 98.1°F | Resp 16 | Ht 59.0 in | Wt 125.0 lb

## 2022-07-05 DIAGNOSIS — Z01818 Encounter for other preprocedural examination: Secondary | ICD-10-CM

## 2022-07-05 DIAGNOSIS — M75101 Unspecified rotator cuff tear or rupture of right shoulder, not specified as traumatic: Secondary | ICD-10-CM | POA: Diagnosis not present

## 2022-07-05 DIAGNOSIS — Z01812 Encounter for preprocedural laboratory examination: Secondary | ICD-10-CM | POA: Diagnosis not present

## 2022-07-05 DIAGNOSIS — I6522 Occlusion and stenosis of left carotid artery: Secondary | ICD-10-CM

## 2022-07-05 DIAGNOSIS — Z87891 Personal history of nicotine dependence: Secondary | ICD-10-CM | POA: Insufficient documentation

## 2022-07-05 DIAGNOSIS — R7303 Prediabetes: Secondary | ICD-10-CM | POA: Diagnosis not present

## 2022-07-05 DIAGNOSIS — I493 Ventricular premature depolarization: Secondary | ICD-10-CM | POA: Diagnosis not present

## 2022-07-05 DIAGNOSIS — I10 Essential (primary) hypertension: Secondary | ICD-10-CM | POA: Diagnosis not present

## 2022-07-05 HISTORY — DX: Prediabetes: R73.03

## 2022-07-05 HISTORY — DX: Unspecified osteoarthritis, unspecified site: M19.90

## 2022-07-05 LAB — CBC
HCT: 35.5 % — ABNORMAL LOW (ref 36.0–46.0)
Hemoglobin: 11.4 g/dL — ABNORMAL LOW (ref 12.0–15.0)
MCH: 30.2 pg (ref 26.0–34.0)
MCHC: 32.1 g/dL (ref 30.0–36.0)
MCV: 93.9 fL (ref 80.0–100.0)
Platelets: 248 10*3/uL (ref 150–400)
RBC: 3.78 MIL/uL — ABNORMAL LOW (ref 3.87–5.11)
RDW: 12.5 % (ref 11.5–15.5)
WBC: 4.4 10*3/uL (ref 4.0–10.5)
nRBC: 0 % (ref 0.0–0.2)

## 2022-07-05 LAB — BASIC METABOLIC PANEL
Anion gap: 11 (ref 5–15)
BUN: 13 mg/dL (ref 8–23)
CO2: 24 mmol/L (ref 22–32)
Calcium: 9.4 mg/dL (ref 8.9–10.3)
Chloride: 105 mmol/L (ref 98–111)
Creatinine, Ser: 0.8 mg/dL (ref 0.44–1.00)
GFR, Estimated: >60 mL/min (ref >60)
Glucose, Bld: 114 mg/dL — ABNORMAL HIGH (ref 70–99)
Potassium: 4.2 mmol/L (ref 3.5–5.1)
Sodium: 140 mmol/L (ref 135–145)

## 2022-07-05 LAB — SURGICAL PCR SCREEN
MRSA, PCR: NEGATIVE
Staphylococcus aureus: NEGATIVE

## 2022-07-05 LAB — HEMOGLOBIN A1C
Hgb A1c MFr Bld: 5.7 % — ABNORMAL HIGH (ref 4.8–5.6)
Mean Plasma Glucose: 116.89 mg/dL

## 2022-07-05 LAB — GLUCOSE, CAPILLARY: Glucose-Capillary: 125 mg/dL — ABNORMAL HIGH (ref 70–99)

## 2022-07-05 NOTE — Patient Instructions (Addendum)
SURGICAL WAITING ROOM VISITATION Patients having surgery or a procedure may have no more than 2 support people in the waiting area - these visitors may rotate.   Children under the age of 89 must have an adult with them who is not the patient. If the patient needs to stay at the hospital during part of their recovery, the visitor guidelines for inpatient rooms apply. Pre-op nurse will coordinate an appropriate time for 1 support person to accompany patient in pre-op.  This support person may not rotate.    Please refer to the [facility] website for the visitor guidelines for Inpatients (after your surgery is over and you are in a regular room).    Your procedure is scheduled on: 07/15/22   Report to [facility] Main Entrance    Report to admitting at 5:15 AM   Call this number if you have problems the morning of surgery [phone]   Do not eat food :After Midnight.   After Midnight you may have the following liquids until 4:30 AM DAY OF SURGERY  Water Non-Citrus Juices (without pulp, NO RED) Carbonated Beverages Black Coffee (NO MILK/CREAM OR CREAMERS, sugar ok)  Clear Tea (NO MILK/CREAM OR CREAMERS, sugar ok) regular and decaf                             Plain Jell-O (NO RED)                                           Fruit ices (not with fruit pulp, NO RED)                                     Popsicles (NO RED)                                                               Sports drinks like Gatorade (NO RED)               The day of surgery:  Drink ONE (1) Pre-Surgery Clear Ensure at 4:30 AM the morning of surgery. Drink in one sitting. Do not sip.  This drink was given to you during your hospital  pre-op appointment visit. Nothing else to drink after completing the  Pre-Surgery Clear Ensure.          If you have questions, please contact your surgeon's office.   FOLLOW BOWEL PREP AND ANY ADDITIONAL PRE OP INSTRUCTIONS YOU RECEIVED FROM YOUR SURGEON'S OFFICE!!!      Oral Hygiene is also important to reduce your risk of infection.                                    Remember - BRUSH YOUR TEETH THE MORNING OF SURGERY WITH YOUR REGULAR TOOTHPASTE   Take these medicines the morning of surgery with A SIP OF WATER: Tylenol, Xanax, Amlodipine, Allegra, Fluoxetine, Labetalol, Pantoprazole, Rosuvastatin, Tramadol, Ativan  You may not have any metal on your body including hair pins, jewelry, and body piercing             Do not wear make-up, lotions, powders, perfumes, or deodorant  Do not wear nail polish including gel and S&S, artificial/acrylic nails, or any other type of covering on natural nails including finger and toenails. If you have artificial nails, gel coating, etc. that needs to be removed by a nail salon please have this removed prior to surgery or surgery may need to be canceled/ delayed if the surgeon/ anesthesia feels like they are unable to be safely monitored.   Do not shave  48 hours prior to surgery.    Do not bring valuables to the hospital. [place].  DO NOT [place]. PHARMACY WILL DISPENSE MEDICATIONS LISTED ON YOUR MEDICATION LIST TO YOU DURING YOUR ADMISSION [place]!    Patients discharged on the day of surgery will not be allowed to drive home.  Someone NEEDS to stay with you for the first 24 hours after anesthesia.   Special Instructions: Bring a copy of your healthcare power of attorney and living will documents the day of surgery if you haven't scanned them before.              Please read over the following fact sheets you were given: IF [place] [phone] Schneiders   If you received a COVID test during your pre-op visit  it is requested that you wear a mask when out in public, stay away from anyone that may not be feeling well and notify your surgeon if  you develop symptoms. If you test positive for Covid or have been in contact with anyone that has tested positive in the last 10 days please notify you surgeon.   - Preparing for Surgery Before surgery, you can play an important role.  Because skin is not sterile, your skin needs to be as free of germs as possible.  You can reduce the number of germs on your skin by washing with CHG (chlorahexidine gluconate) soap before surgery.  CHG is an antiseptic cleaner which kills germs and bonds with the skin to continue killing germs even after washing. Please DO NOT use if you have an allergy to CHG or antibacterial soaps.  If your skin becomes reddened/irritated stop using the CHG and inform your nurse when you arrive at Short Stay. Do not shave (including legs and underarms) for at least 48 hours prior to the first CHG shower.  You may shave your face/neck.  Please follow these instructions carefully:  1.  Shower with CHG Soap the night before surgery and the  morning of surgery.  2.  If you choose to wash your hair, wash your hair first as usual with your normal  shampoo.  3.  After you shampoo, rinse your hair and body thoroughly to remove the shampoo.                             4.  Use CHG as you would any other liquid soap.  You can apply chg directly to the skin and wash.  Gently with a scrungie or clean washcloth.  5.  Apply the CHG Soap to your body ONLY FROM  THE NECK DOWN.   Do   not use on face/ open                           Wound or open sores. Avoid contact with eyes, ears mouth and   genitals (private parts).                       Wash face,  Genitals (private parts) with your normal soap.             6.  Wash thoroughly, paying special attention to the area where your    surgery  will be performed.  7.  Thoroughly rinse your body with warm water from the neck down.  8.  DO NOT shower/wash with your normal soap after using and rinsing off the CHG Soap.                9.  Pat  yourself dry with a clean towel.            10.  Wear clean pajamas.            11.  Place clean sheets on your bed the night of your first shower and do not  sleep with pets. Day of Surgery : Do not apply any lotions/deodorants the morning of surgery.  Please wear clean clothes to the hospital/surgery center.  FAILURE TO FOLLOW THESE INSTRUCTIONS MAY RESULT IN THE CANCELLATION OF YOUR SURGERY  PATIENT SIGNATURE_________________________________  NURSE SIGNATURE__________________________________  ________________________________________________________________________      Adam Phenix  An incentive spirometer is a tool that can help keep your lungs clear and active. This tool measures how well you are filling your lungs with each breath. Taking long deep breaths may help reverse or decrease the chance of developing breathing (pulmonary) problems (especially infection) following: A long period of time when you are unable to move or be active. BEFORE THE PROCEDURE  If the spirometer includes an indicator to show your best effort, your nurse or respiratory therapist will set it to a desired goal. If possible, sit up straight or lean slightly forward. Try not to slouch. Hold the incentive spirometer in an upright position. INSTRUCTIONS FOR USE  Sit on the edge of your bed if possible, or sit up as far as you can in bed or on a chair. Hold the incentive spirometer in an upright position. Breathe out normally. Place the mouthpiece in your mouth and seal your lips tightly around it. Breathe in slowly and as deeply as possible, raising the piston or the ball toward the top of the column. Hold your breath for 3-5 seconds or for as long as possible. Allow the piston or ball to fall to the bottom of the column. Remove the mouthpiece from your mouth and breathe out normally. Rest for a few seconds and repeat Steps 1 through 7 at least 10 times every 1-2 hours when you are awake. Take your  time and take a few normal breaths between deep breaths. The spirometer may include an indicator to show your best effort. Use the indicator as a goal to work toward during each repetition. After each set of 10 deep breaths, practice coughing to be sure your lungs are clear. If you have an incision (the cut made at the time of surgery), support your incision when coughing by placing a pillow or rolled up towels firmly against it. Once you are able to get  out of bed, walk around indoors and cough well. You may stop using the incentive spirometer when instructed by your caregiver.  RISKS AND COMPLICATIONS Take your time so you do not get dizzy or light-headed. If you are in pain, you may need to take or ask for pain medication before doing incentive spirometry. It is harder to take a deep breath if you are having pain. AFTER USE Rest and breathe slowly and easily. It can be helpful to keep track of a log of your progress. Your caregiver can provide you with a simple table to help with this. If you are using the spirometer at home, follow these instructions: [place] IF:  You are having difficultly using the spirometer. You have trouble using the spirometer as often as instructed. Your pain medication is not giving enough relief while using the spirometer. You develop fever of 100.5 F (38.1 C) or higher. SEEK IMMEDIATE MEDICAL CARE IF:  You cough up bloody sputum that had not been present before. You develop fever of 102 F (38.9 C) or greater. You develop worsening pain at or near the incision site. MAKE SURE YOU:  Understand these instructions. Will watch your condition. Will get help right away if you are not doing well or get worse. Document Released: 12/26/2006 Document Revised: 11/07/2011 Document Reviewed: 02/26/2007 ExitCare Patient Information 2014 [place].   ________________________________________________________________________ [facility]- Preparing for  Total Shoulder Arthroplasty    Before surgery, you can play an important role. Because skin is not sterile, your skin needs to be as free of germs as possible. You can reduce the number of germs on your skin by using the following products. Benzoyl Peroxide Gel Reduces the number of germs present on the skin Applied twice a day to shoulder area starting two days before surgery    ==================================================================  Please follow these instructions carefully:  BENZOYL PEROXIDE 5% GEL  Please do not use if you have an allergy to benzoyl peroxide.   If your skin becomes reddened/irritated stop using the benzoyl peroxide.  Starting two days before surgery, apply as follows: Apply benzoyl peroxide in the morning and at night. Apply after taking a shower. If you are not taking a shower clean entire shoulder front, back, and side along with the armpit with a clean wet washcloth.  Place a quarter-sized dollop on your shoulder and rub in thoroughly, making sure to cover the front, back, and side of your shoulder, along with the armpit.   2 days before ____ AM   ____ PM              1 day before ____ AM   ____ PM                         Do this twice a day for two days.  (Last application is the night before surgery, AFTER using the CHG soap as described below).  Do NOT apply benzoyl peroxide gel on the day of surgery.

## 2022-07-06 ENCOUNTER — Telehealth: Payer: Self-pay | Admitting: Internal Medicine

## 2022-07-06 DIAGNOSIS — H40023 Open angle with borderline findings, high risk, bilateral: Secondary | ICD-10-CM | POA: Diagnosis not present

## 2022-07-06 DIAGNOSIS — H35411 Lattice degeneration of retina, right eye: Secondary | ICD-10-CM | POA: Diagnosis not present

## 2022-07-06 DIAGNOSIS — H43812 Vitreous degeneration, left eye: Secondary | ICD-10-CM | POA: Diagnosis not present

## 2022-07-06 DIAGNOSIS — H02831 Dermatochalasis of right upper eyelid: Secondary | ICD-10-CM | POA: Diagnosis not present

## 2022-07-06 DIAGNOSIS — D3131 Benign neoplasm of right choroid: Secondary | ICD-10-CM | POA: Diagnosis not present

## 2022-07-06 DIAGNOSIS — H04123 Dry eye syndrome of bilateral lacrimal glands: Secondary | ICD-10-CM | POA: Diagnosis not present

## 2022-07-06 DIAGNOSIS — H02834 Dermatochalasis of left upper eyelid: Secondary | ICD-10-CM | POA: Diagnosis not present

## 2022-07-06 DIAGNOSIS — H25813 Combined forms of age-related cataract, bilateral: Secondary | ICD-10-CM | POA: Diagnosis not present

## 2022-07-06 NOTE — Anesthesia Preprocedure Evaluation (Addendum)
Anesthesia Evaluation  Patient identified by MRN, date of birth, ID band Patient awake    Reviewed: Allergy & Precautions, NPO status , Patient's Chart, lab work & pertinent test results  Airway Mallampati: III  TM Distance: >3 FB Neck ROM: Full    Dental no notable dental hx.    Pulmonary former smoker   Pulmonary exam normal        Cardiovascular hypertension, Pt. on medications and Pt. on home beta blockers + Peripheral Vascular Disease  Normal cardiovascular exam     Neuro/Psych  PSYCHIATRIC DISORDERS Anxiety      Neuromuscular disease    GI/Hepatic Neg liver ROS,GERD  Medicated and Controlled,,  Endo/Other  negative endocrine ROS    Renal/GU negative Renal ROS     Musculoskeletal  (+) Arthritis ,    Abdominal   Peds  Hematology  (+) Blood dyscrasia, anemia   Anesthesia Other Findings Right shoulder rotator cuff tear arthropathy  Reproductive/Obstetrics                             Anesthesia Physical Anesthesia Plan  ASA: 2  Anesthesia Plan: Regional and General   Post-op Pain Management: Regional block*   Induction: Intravenous  PONV Risk Score and Plan: 3 and Ondansetron, Dexamethasone and Treatment may vary due to age or medical condition  Airway Management Planned: Oral ETT  Additional Equipment:   Intra-op Plan:   Post-operative Plan: Extubation in OR  Informed Consent: I have reviewed the patients History and Physical, chart, labs and discussed the procedure including the risks, benefits and alternatives for the proposed anesthesia with the patient or authorized representative who has indicated his/her understanding and acceptance.     Dental advisory given  Plan Discussed with: CRNA  Anesthesia Plan Comments: (PAT note 07/05/2022)       Anesthesia Quick Evaluation

## 2022-07-06 NOTE — Progress Notes (Signed)
Anesthesia Chart Review   Case: [id number] Date/Time: 07/15/22 0715   Procedure: REVERSE SHOULDER ARTHROPLASTY (Right: Shoulder) - 173mn   Anesthesia type: General   Pre-op diagnosis: Right shoulder rotator cuff tear arthropathy   [facility]: [facility] / [facility]   Surgeons: SJustice Britain MD       DISCUSSION:76 y.o. former smoker with h/o HTN, PVC, right shoulder rotator cuff tear scheduled for above procedure 07/15/2022 with Dr. KJustice Britain   Pt seen by cardiology 10//11/2021 for preoperative evaluation. Per OV note, "Preoperative cardiovascular examination [name]'s perioperative risk of a major cardiac event is 0.4% according to the Revised Cardiac Risk Index (RCRI).  Therefore, she is at low risk for perioperative complications.   Her functional capacity is good at 4.31 METs according to the Duke Activity Status Index (DASI). Recommendations: According to ACC/AHA guidelines, no further cardiovascular testing needed.  The patient may proceed to surgery at acceptable risk.   Antiplatelet and/or Anticoagulation Recommendations: Aspirin can be held for 7 days prior to her surgery.  Please resume Aspirin post operatively when it is felt to be safe from a bleeding standpoint."  Carotid [place]10/04/2022. Per results, "UK[place]ows elevated velocities in the left carotid artery but it appears to be due to kink in vessel. There is no evidence of plaque. She does not appear to have blockages in her neck arteries. PLAN: -Continue current medications/treatment plan and follow up as scheduled. -Schedule repeat [place]ain 3 years."  Anticipate pt can proceed with planned procedure barring acute status change.   VS: BP (!) 125/59   Pulse 64   Temp 36.7 C (Oral)   Resp 16   Ht '4\' 11"'$  (1.499 m)   Wt 56.7 kg   SpO2 100%   BMI 25.25 kg/m   PROVIDERS: Plot[place], AEvie Lacks MD is PCP   Cardiologist:  PDorris Carnes MD      LABS: Labs reviewed: Acceptable for surgery. (all labs ordered are  listed, but only abnormal results are displayed)  Labs Reviewed  BASIC METABOLIC PANEL - Abnormal; Notable for the following components:      Result Value   Glucose, Bld 114 (*)    All other components within normal limits  CBC - Abnormal; Notable for the following components:   RBC 3.78 (*)    Hemoglobin 11.4 (*)    HCT 35.5 (*)    All other components within normal limits  HEMOGLOBIN A1C - Abnormal; Notable for the following components:   Hgb A1c MFr Bld 5.7 (*)    All other components within normal limits  GLUCOSE, CAPILLARY - Abnormal; Notable for the following components:   Glucose-Capillary 125 (*)    All other components within normal limits  SURGICAL PCR SCREEN     IMAGES:   EKG:   CV:  Past Medical History:  Diagnosis Date   Anxiety    Arthritis    Celiac sprue 2007   CVD (cardiovascular disease)    Foot fracture, left 2011   GERD (gastroesophageal reflux disease)    Hyperlipidemia    Hypertension    Osteopenia 04/2019   T score -1.9 FRAX 10% / 1.9%.  Stable from prior DEXA   Pre-diabetes    PVD (peripheral vascular disease) (HCC)    mild carotid ? fibromuscular dysplasia   Shingles    Urinary incontinence     Past Surgical History:  Procedure Laterality Date   APPENDECTOMY     BREAST SURGERY     right breast  biopsy   ESOPHAGOGASTRODUODENOSCOPY     ROTATOR CUFF REPAIR     RIGHT   ROTATOR CUFF REPAIR     Left   stress cardiolite  04-18-00   VAGINAL HYSTERECTOMY  1993   leiomyomata    MEDICATIONS:  acetaminophen (TYLENOL) 500 MG tablet   ALPRAZolam (XANAX) 0.25 MG tablet   alum & mag hydroxide-simeth (MAALOX/MYLANTA) 200-200-20 MG/5ML suspension   amLODipine (NORVASC) 10 MG tablet   aspirin 81 MG tablet   Calcium-Vitamin D-Vitamin K 650-12.5-40 MG-MCG-MCG CHEW   Cholecalciferol 2000 UNITS TABS   Coenzyme Q10 (CO Q-10 PO)   Evolocumab (REPATHA SURECLICK) 191 MG/ML SOAJ   fexofenadine (ALLEGRA) 180 MG tablet   FLUoxetine (PROZAC) 10 MG  capsule   fluticasone (FLONASE) 50 MCG/ACT nasal spray   irbesartan (AVAPRO) 300 MG tablet   labetalol (NORMODYNE) 300 MG tablet   LORazepam (ATIVAN) 0.5 MG tablet   Multiple Vitamins-Minerals (MULTIVITAMIN WOMEN 50+) TABS   NONFORMULARY OR COMPOUNDED ITEM   pantoprazole (PROTONIX) 40 MG tablet   rosuvastatin (CRESTOR) 10 MG tablet   traMADol-acetaminophen (ULTRACET) 37.5-325 MG tablet   valACYclovir (VALTREX) 1000 MG tablet   No current facility-administered medications for this encounter.    Konrad Felix Ward, PA-C [facility] Pre-Surgical Testing [phone]

## 2022-07-06 NOTE — Telephone Encounter (Signed)
LVM for pt to rtn my call to schedule AWV with [facility] call bak # [phone]

## 2022-07-15 ENCOUNTER — Encounter (HOSPITAL_COMMUNITY)
Admission: RE | Disposition: A | Discharge status: Home / Self Care | Payer: Self-pay | Source: Home / Self Care | Attending: Orthopedic Surgery

## 2022-07-15 ENCOUNTER — Other Ambulatory Visit: Payer: Self-pay

## 2022-07-15 ENCOUNTER — Ambulatory Visit (HOSPITAL_COMMUNITY)
Admission: RE | Admit: 2022-07-15 | Discharge: 2022-07-15 | Disposition: A | Discharge status: Home / Self Care | Payer: Medicare Other | Attending: Orthopedic Surgery | Admitting: Orthopedic Surgery

## 2022-07-15 ENCOUNTER — Ambulatory Visit (HOSPITAL_BASED_OUTPATIENT_CLINIC_OR_DEPARTMENT_OTHER): Payer: Medicare Other | Admitting: Anesthesiology

## 2022-07-15 ENCOUNTER — Ambulatory Visit (HOSPITAL_COMMUNITY): Payer: Medicare Other | Admitting: Physician Assistant

## 2022-07-15 ENCOUNTER — Encounter (HOSPITAL_COMMUNITY): Payer: Self-pay | Admitting: Orthopedic Surgery

## 2022-07-15 DIAGNOSIS — M75101 Unspecified rotator cuff tear or rupture of right shoulder, not specified as traumatic: Secondary | ICD-10-CM | POA: Diagnosis not present

## 2022-07-15 DIAGNOSIS — F419 Anxiety disorder, unspecified: Secondary | ICD-10-CM | POA: Diagnosis not present

## 2022-07-15 DIAGNOSIS — I10 Essential (primary) hypertension: Secondary | ICD-10-CM | POA: Insufficient documentation

## 2022-07-15 DIAGNOSIS — M12811 Other specific arthropathies, not elsewhere classified, right shoulder: Secondary | ICD-10-CM

## 2022-07-15 DIAGNOSIS — K219 Gastro-esophageal reflux disease without esophagitis: Secondary | ICD-10-CM | POA: Diagnosis not present

## 2022-07-15 DIAGNOSIS — I739 Peripheral vascular disease, unspecified: Secondary | ICD-10-CM | POA: Diagnosis not present

## 2022-07-15 DIAGNOSIS — Z87891 Personal history of nicotine dependence: Secondary | ICD-10-CM | POA: Diagnosis not present

## 2022-07-15 DIAGNOSIS — M19011 Primary osteoarthritis, right shoulder: Secondary | ICD-10-CM | POA: Diagnosis not present

## 2022-07-15 DIAGNOSIS — G8918 Other acute postprocedural pain: Secondary | ICD-10-CM | POA: Diagnosis not present

## 2022-07-15 HISTORY — PX: REVERSE SHOULDER ARTHROPLASTY: SHX5054

## 2022-07-15 SURGERY — ARTHROPLASTY, SHOULDER, TOTAL, REVERSE
Anesthesia: Regional | Site: Shoulder | Laterality: Right

## 2022-07-15 MED ORDER — ONDANSETRON HCL 4 MG PO TABS: 4.0000 mg | ORAL_TABLET | Freq: Four times a day (QID) | ORAL | Status: DC | PRN

## 2022-07-15 MED ORDER — CHLORHEXIDINE GLUCONATE 0.12 % MT SOLN
15.0000 mL | Freq: Once | OROMUCOSAL | Status: AC
  Administered 2022-07-15: 15 mL via OROMUCOSAL

## 2022-07-15 MED ORDER — ACETAMINOPHEN 500 MG PO TABS
ORAL_TABLET | ORAL | Status: AC
  Filled 2022-07-15: qty 2

## 2022-07-15 MED ORDER — FENTANYL CITRATE (PF) 100 MCG/2ML IJ SOLN
INTRAMUSCULAR | Status: DC | PRN
  Administered 2022-07-15: 50 ug via INTRAVENOUS

## 2022-07-15 MED ORDER — PROPOFOL 10 MG/ML IV BOLUS
INTRAVENOUS | Status: DC | PRN
  Administered 2022-07-15: 120 mg via INTRAVENOUS
  Administered 2022-07-15: 30 mg via INTRAVENOUS

## 2022-07-15 MED ORDER — LACTATED RINGERS IV BOLUS
500.0000 mL | Freq: Once | INTRAVENOUS | Status: AC
  Administered 2022-07-15: 500 mL via INTRAVENOUS

## 2022-07-15 MED ORDER — FENTANYL CITRATE (PF) 100 MCG/2ML IJ SOLN
INTRAMUSCULAR | Status: AC
  Filled 2022-07-15: qty 2

## 2022-07-15 MED ORDER — SUGAMMADEX SODIUM 200 MG/2ML IV SOLN
INTRAVENOUS | Status: DC | PRN
  Administered 2022-07-15: 200 mg via INTRAVENOUS

## 2022-07-15 MED ORDER — PHENYLEPHRINE HCL-NACL 20-0.9 MG/250ML-% IV SOLN
INTRAVENOUS | Status: DC | PRN
  Administered 2022-07-15: 50 ug/min via INTRAVENOUS

## 2022-07-15 MED ORDER — ONDANSETRON HCL 4 MG/2ML IJ SOLN
INTRAMUSCULAR | Status: DC | PRN
  Administered 2022-07-15: 4 mg via INTRAVENOUS

## 2022-07-15 MED ORDER — LACTATED RINGERS IV SOLN: INTRAVENOUS | Status: DC

## 2022-07-15 MED ORDER — BUPIVACAINE HCL (PF) 0.5 % IJ SOLN
INTRAMUSCULAR | Status: DC | PRN
  Administered 2022-07-15: 10 mL via PERINEURAL

## 2022-07-15 MED ORDER — VANCOMYCIN HCL 1000 MG IV SOLR
INTRAVENOUS | Status: DC | PRN
  Administered 2022-07-15: 1000 mg

## 2022-07-15 MED ORDER — METOCLOPRAMIDE HCL 5 MG PO TABS: 5.0000 mg | ORAL_TABLET | Freq: Three times a day (TID) | ORAL | Status: DC | PRN

## 2022-07-15 MED ORDER — AMISULPRIDE (ANTIEMETIC) 5 MG/2ML IV SOLN
10.0000 mg | Freq: Once | INTRAVENOUS | Status: AC | PRN
  Administered 2022-07-15: 10 mg via INTRAVENOUS

## 2022-07-15 MED ORDER — PHENYLEPHRINE HCL-NACL 20-0.9 MG/250ML-% IV SOLN
INTRAVENOUS | Status: AC
  Filled 2022-07-15: qty 250

## 2022-07-15 MED ORDER — ONDANSETRON HCL 4 MG/2ML IJ SOLN: 4.0000 mg | Freq: Four times a day (QID) | INTRAMUSCULAR | Status: DC | PRN

## 2022-07-15 MED ORDER — LIDOCAINE 2% (20 MG/ML) 5 ML SYRINGE
INTRAMUSCULAR | Status: DC | PRN
  Administered 2022-07-15: 60 mg via INTRAVENOUS

## 2022-07-15 MED ORDER — EPHEDRINE SULFATE-NACL 50-0.9 MG/10ML-% IV SOSY
PREFILLED_SYRINGE | INTRAVENOUS | Status: DC | PRN
  Administered 2022-07-15: 15 mg via INTRAVENOUS
  Administered 2022-07-15: 10 mg via INTRAVENOUS

## 2022-07-15 MED ORDER — BUPIVACAINE LIPOSOME 1.3 % IJ SUSP
INTRAMUSCULAR | Status: DC | PRN
  Administered 2022-07-15: 10 mL via PERINEURAL

## 2022-07-15 MED ORDER — PROPOFOL 10 MG/ML IV BOLUS
INTRAVENOUS | Status: AC
  Filled 2022-07-15: qty 20

## 2022-07-15 MED ORDER — 0.9 % SODIUM CHLORIDE (POUR BTL) OPTIME
TOPICAL | Status: DC | PRN
  Administered 2022-07-15: 1000 mL

## 2022-07-15 MED ORDER — STERILE WATER FOR IRRIGATION IR SOLN
Status: DC | PRN
  Administered 2022-07-15: 2000 mL

## 2022-07-15 MED ORDER — ONDANSETRON HCL 4 MG/2ML IJ SOLN
INTRAMUSCULAR | Status: AC
  Filled 2022-07-15: qty 2

## 2022-07-15 MED ORDER — LACTATED RINGERS IV BOLUS
250.0000 mL | Freq: Once | INTRAVENOUS | Status: AC
  Administered 2022-07-15: 250 mL via INTRAVENOUS

## 2022-07-15 MED ORDER — ONDANSETRON HCL 4 MG/2ML IJ SOLN: 4.0000 mg | Freq: Once | INTRAMUSCULAR | Status: DC | PRN

## 2022-07-15 MED ORDER — AMISULPRIDE (ANTIEMETIC) 5 MG/2ML IV SOLN
INTRAVENOUS | Status: AC
  Filled 2022-07-15: qty 4

## 2022-07-15 MED ORDER — OXYCODONE-ACETAMINOPHEN 5-325 MG PO TABS: 1.0000 | ORAL_TABLET | ORAL | 0 refills | Status: DC | PRN

## 2022-07-15 MED ORDER — ORAL CARE MOUTH RINSE: 15.0000 mL | Freq: Once | OROMUCOSAL | Status: AC

## 2022-07-15 MED ORDER — DEXAMETHASONE SODIUM PHOSPHATE 10 MG/ML IJ SOLN
INTRAMUSCULAR | Status: DC | PRN
  Administered 2022-07-15: 5 mg via INTRAVENOUS

## 2022-07-15 MED ORDER — ROCURONIUM BROMIDE 10 MG/ML (PF) SYRINGE
PREFILLED_SYRINGE | INTRAVENOUS | Status: AC
  Filled 2022-07-15: qty 10

## 2022-07-15 MED ORDER — TRANEXAMIC ACID-NACL 1000-0.7 MG/100ML-% IV SOLN
1000.0000 mg | INTRAVENOUS | Status: AC
  Administered 2022-07-15: 1000 mg via INTRAVENOUS
  Filled 2022-07-15: qty 100

## 2022-07-15 MED ORDER — TRANEXAMIC ACID 1000 MG/10ML IV SOLN: 1000.0000 mg | INTRAVENOUS | Status: DC

## 2022-07-15 MED ORDER — ACETAMINOPHEN 500 MG PO TABS
1000.0000 mg | ORAL_TABLET | Freq: Once | ORAL | Status: AC
  Administered 2022-07-15: 1000 mg via ORAL

## 2022-07-15 MED ORDER — ONDANSETRON HCL 4 MG PO TABS: 4.0000 mg | ORAL_TABLET | Freq: Three times a day (TID) | ORAL | 0 refills | Status: DC | PRN

## 2022-07-15 MED ORDER — TIZANIDINE HCL 4 MG PO TABS: 4.0000 mg | ORAL_TABLET | Freq: Three times a day (TID) | ORAL | 1 refills | Status: DC | PRN

## 2022-07-15 MED ORDER — CEFAZOLIN SODIUM-DEXTROSE 2-4 GM/100ML-% IV SOLN
2.0000 g | INTRAVENOUS | Status: AC
  Administered 2022-07-15: 2 g via INTRAVENOUS
  Filled 2022-07-15: qty 100

## 2022-07-15 MED ORDER — MELOXICAM 15 MG PO TABS: 15.0000 mg | ORAL_TABLET | Freq: Every day | ORAL | 2 refills | Status: AC

## 2022-07-15 MED ORDER — LIDOCAINE HCL (PF) 2 % IJ SOLN
INTRAMUSCULAR | Status: AC
  Filled 2022-07-15: qty 5

## 2022-07-15 MED ORDER — METOCLOPRAMIDE HCL 5 MG/ML IJ SOLN: 5.0000 mg | Freq: Three times a day (TID) | INTRAMUSCULAR | Status: DC | PRN

## 2022-07-15 MED ORDER — FENTANYL CITRATE PF 50 MCG/ML IJ SOSY: 25.0000 ug | PREFILLED_SYRINGE | INTRAMUSCULAR | Status: DC | PRN

## 2022-07-15 MED ORDER — ROCURONIUM BROMIDE 10 MG/ML (PF) SYRINGE
PREFILLED_SYRINGE | INTRAVENOUS | Status: DC | PRN
  Administered 2022-07-15: 10 mg via INTRAVENOUS
  Administered 2022-07-15: 40 mg via INTRAVENOUS

## 2022-07-15 MED ORDER — DEXAMETHASONE SODIUM PHOSPHATE 10 MG/ML IJ SOLN
INTRAMUSCULAR | Status: AC
  Filled 2022-07-15: qty 1

## 2022-07-15 MED ORDER — VANCOMYCIN HCL 1000 MG IV SOLR
INTRAVENOUS | Status: AC
  Filled 2022-07-15: qty 20

## 2022-07-15 SURGICAL SUPPLY — 76 items
ADH SKN CLS APL DERMABOND .7 (GAUZE/BANDAGES/DRESSINGS) ×1
AID PSTN UNV HD RSTRNT DISP (MISCELLANEOUS) ×1
BAG COUNTER SPONGE SURGICOUNT (BAG) IMPLANT
BAG SPEC THK2 15X12 ZIP CLS (MISCELLANEOUS) ×1
BAG SPNG CNTER NS LX DISP (BAG) ×1
BAG ZIPLOCK 12X15 (MISCELLANEOUS) ×1 IMPLANT
BLADE SAW SGTL 83.5X18.5 (BLADE) ×1 IMPLANT
BNDG CMPR 5X4 CHSV STRCH STRL (GAUZE/BANDAGES/DRESSINGS) ×1
BNDG COHESIVE 4X5 TAN STRL LF (GAUZE/BANDAGES/DRESSINGS) ×1 IMPLANT
BSPLAT GLND +2X24 MDLR (Joint) ×1 IMPLANT
COOLER ICEMAN CLASSIC (MISCELLANEOUS) ×1 IMPLANT
COVER BACK TABLE 60X90IN (DRAPES) ×1 IMPLANT
COVER SURGICAL LIGHT HANDLE (MISCELLANEOUS) ×1 IMPLANT
CUP SUT UNIV REV NEUTRAL 33 (Cup) IMPLANT
DERMABOND ADVANCED .7 DNX12 (GAUZE/BANDAGES/DRESSINGS) ×1 IMPLANT
DRAPE INCISE IOBAN 66X45 STRL (DRAPES) IMPLANT
DRAPE ORTHO SPLIT 77X108 STRL (DRAPES) ×2
DRAPE SHEET LG 3/4 BI-LAMINATE (DRAPES) ×1 IMPLANT
DRAPE SURG 17X11 SM STRL (DRAPES) ×1 IMPLANT
DRAPE SURG ORHT 6 SPLT 77X108 (DRAPES) ×2 IMPLANT
DRAPE TOP 10253 STERILE (DRAPES) ×1 IMPLANT
DRAPE U-SHAPE 47X51 STRL (DRAPES) ×1 IMPLANT
DRESSING AQUACEL AG SP 3.5X6 (GAUZE/BANDAGES/DRESSINGS) ×1 IMPLANT
DRSG AQUACEL AG ADV 3.5X 6 (GAUZE/BANDAGES/DRESSINGS) IMPLANT
DRSG AQUACEL AG ADV 3.5X10 (GAUZE/BANDAGES/DRESSINGS) IMPLANT
DRSG AQUACEL AG SP 3.5X6 (GAUZE/BANDAGES/DRESSINGS) ×1
DRSG TEGADERM 8X12 (GAUZE/BANDAGES/DRESSINGS) ×1 IMPLANT
DURAPREP 26ML APPLICATOR (WOUND CARE) ×1 IMPLANT
ELECT BLADE TIP CTD 4 INCH (ELECTRODE) ×1 IMPLANT
ELECT PENCIL ROCKER SW 15FT (MISCELLANEOUS) ×1 IMPLANT
ELECT REM PT RETURN 15FT ADLT (MISCELLANEOUS) ×1 IMPLANT
FACESHIELD WRAPAROUND (MASK) ×5 IMPLANT
FACESHIELD WRAPAROUND OR TEAM (MASK) ×5 IMPLANT
GLENOID UNI REV MOD 24 +2 LAT (Joint) IMPLANT
GLENOSPHERE 33+4 LAT/24 UNI RV (Joint) IMPLANT
GLOVE BIO SURGEON STRL SZ7.5 (GLOVE) ×1 IMPLANT
GLOVE BIO SURGEON STRL SZ8 (GLOVE) ×1 IMPLANT
GLOVE SS BIOGEL STRL SZ 7 (GLOVE) ×1 IMPLANT
GLOVE SS BIOGEL STRL SZ 7.5 (GLOVE) ×1 IMPLANT
GOWN STRL SURGICAL XL XLNG (GOWN DISPOSABLE) ×2 IMPLANT
KIT BASIN OR (CUSTOM PROCEDURE TRAY) ×1 IMPLANT
KIT TURNOVER KIT A (KITS) IMPLANT
LINER GLENOID HUM REV 33 +3 (Liner) IMPLANT
MANIFOLD NEPTUNE II (INSTRUMENTS) ×1 IMPLANT
NDL TAPERED W/ NITINOL LOOP (MISCELLANEOUS) ×1 IMPLANT
NEEDLE TAPERED W/ NITINOL LOOP (MISCELLANEOUS) ×1 IMPLANT
NS IRRIG 1000ML POUR BTL (IV SOLUTION) ×1 IMPLANT
PACK SHOULDER (CUSTOM PROCEDURE TRAY) ×1 IMPLANT
PAD ARMBOARD 7.5X6 YLW CONV (MISCELLANEOUS) ×1 IMPLANT
PAD COLD SHLDR WRAP-ON (PAD) ×1 IMPLANT
PAD ORTHO SHOULDER 7X19 LRG (SOFTGOODS) IMPLANT
PIN NITINOL TARGETER 2.8 (PIN) IMPLANT
PIN SET MODULAR GLENOID SYSTEM (PIN) IMPLANT
RESTRAINT HEAD UNIVERSAL NS (MISCELLANEOUS) ×1 IMPLANT
SCREW CENTRAL MODULAR 25 (Screw) IMPLANT
SCREW PERI LOCK 5.5X16 (Screw) IMPLANT
SCREW PERI LOCK 5.5X32 (Screw) IMPLANT
SCREW PERIPHERAL 5.5X20 LOCK (Screw) IMPLANT
SLING ARM FOAM STRAP LRG (SOFTGOODS) IMPLANT
SLING ARM FOAM STRAP MED (SOFTGOODS) IMPLANT
SPONGE T-LAP 4X18 ~~LOC~~+RFID (SPONGE) ×1 IMPLANT
STEM HUMERAL MOD SZ 5 135 DEG (Stem) IMPLANT
SUCTION FRAZIER HANDLE 12FR (TUBING) ×1
SUCTION TUBE FRAZIER 12FR DISP (TUBING) ×1 IMPLANT
SUT FIBERWIRE #2 38 T-5 BLUE (SUTURE) ×2
SUT MNCRL AB 3-0 PS2 18 (SUTURE) ×1 IMPLANT
SUT MON AB 2-0 CT1 36 (SUTURE) ×1 IMPLANT
SUT VIC AB 1 CT1 36 (SUTURE) ×1 IMPLANT
SUTURE FIBERWR #2 38 T-5 BLUE (SUTURE) IMPLANT
SUTURE TAPE 1.3 40 TPR END (SUTURE) ×2 IMPLANT
SUTURETAPE 1.3 40 TPR END (SUTURE) ×2
TOWEL OR 17X26 10 PK STRL BLUE (TOWEL DISPOSABLE) ×1 IMPLANT
TOWEL OR NON WOVEN STRL DISP B (DISPOSABLE) ×1 IMPLANT
TUBE SUCTION HIGH CAP CLEAR NV (SUCTIONS) ×1 IMPLANT
TUBING CONNECTING 10 (TUBING) IMPLANT
WATER STERILE IRR 1000ML POUR (IV SOLUTION) ×2 IMPLANT

## 2022-07-15 NOTE — Anesthesia Procedure Notes (Signed)
Anesthesia Regional Block: Interscalene brachial plexus block   Pre-Anesthetic Checklist: , timeout performed,  Correct Patient, Correct Site, Correct Laterality,  Correct Procedure, Correct Position, site marked,  Risks and benefits discussed,  Surgical consent,  Pre-op evaluation,  At surgeon's request and post-op pain management  Laterality: Right  Prep: chloraprep       Needles:  Injection technique: Single-shot  Needle Type: Echogenic Stimulator Needle     Needle Length: 9cm  Needle Gauge: 21     Additional Needles:   Procedures:,,,, ultrasound used (permanent image in chart),,    Narrative:  Start time: 07/15/2022 6:55 AM End time: 07/15/2022 7:05 AM Injection made incrementally with aspirations every 5 mL.  Performed by: Personally  Anesthesiologist: Murvin Natal, MD  Additional Notes: Functioning IV was confirmed and monitors were applied.  A timeout was performed. Sterile prep, hand hygiene and sterile gloves were used. A 72m 21ga Arrow echogenic stimulator needle was used. Negative aspiration and negative test dose prior to incremental administration of local anesthetic. The patient tolerated the procedure well.  Ultrasound guidance: relevent anatomy identified, needle position confirmed, local anesthetic spread visualized around nerve(s), vascular puncture avoided.  Image printed for medical record.

## 2022-07-15 NOTE — Discharge Instructions (Signed)

## 2022-07-15 NOTE — Transfer of Care (Signed)
Immediate Anesthesia Transfer of Care Note  Patient: Sharon Paul  Procedure(s) Performed: REVERSE SHOULDER ARTHROPLASTY (Right: Shoulder)  Patient Location: PACU  Anesthesia Type:General  Level of Consciousness: sedated  Airway & Oxygen Therapy: Patient Spontanous Breathing and Patient connected to face mask oxygen  Post-op Assessment: Report given to RN and Post -op Vital signs reviewed and stable  Post vital signs: Reviewed and stable  Last Vitals:  Vitals Value Taken Time  BP    Temp    Pulse 57 07/15/22 0940  Resp 13 07/15/22 0940  SpO2 96 % 07/15/22 0940  Vitals shown include unvalidated device data.  Last Pain:  Vitals:   07/15/22 0606  TempSrc: Oral  PainSc:          Complications:  Encounter Notable Events  Notable Event Outcome Phase Comment  Difficult to intubate - unexpected  Intraprocedure Filed from anesthesia note documentation.

## 2022-07-15 NOTE — Anesthesia Procedure Notes (Signed)
Procedure Name: Intubation Date/Time: 07/15/2022 7:42 AM  Performed by: Lind Covert, CRNAPre-anesthesia Checklist: Patient identified, Emergency Drugs available, Suction available and Patient being monitored Patient Re-evaluated:Patient Re-evaluated prior to induction Oxygen Delivery Method: Circle system utilized Preoxygenation: Pre-oxygenation with 100% oxygen Induction Type: IV induction Ventilation: Mask ventilation without difficulty Laryngoscope Size: Mac, 3 and Glidescope Grade View: Grade III Tube type: Oral Tube size: 6.5 mm Number of attempts: 2 Airway Equipment and Method: Stylet and Video-laryngoscopy Placement Confirmation: ETT inserted through vocal cords under direct vision, positive ETCO2 and breath sounds checked- equal and bilateral Secured at: 21 cm Tube secured with: Tape Dental Injury: Teeth and Oropharynx as per pre-operative assessment  Difficulty Due To: Difficulty was unanticipated and Difficult Airway- due to anterior larynx Comments: Patient easy to mask ventilate. MAC blade used unable to see cords floppy epiglottis. Esophageal intubation x 1 removed. Moved to glidescope MAC 3 blade grade 1 view Unable to pass 7 ETT inserted 6.5 ETT without difficulty. +ETCO2 + BBS. ETT taped at 21.

## 2022-07-15 NOTE — H&P (Signed)
Sharon Paul    Chief Complaint: Right shoulder rotator cuff tear arthropathy HPI: The patient is a 76 y.o. female with chronic and progressively increasing right shoulder pain related to severe rotator cuff tear arthropathy.  Due to her increasing functional limitations and failure to respond to prolonged attempts at conservative management, she is brought to the operating room at this time for planned right shoulder reverse arthroplasty.  Past Medical History:  Diagnosis Date   Anxiety    Arthritis    Celiac sprue 2007   CVD (cardiovascular disease)    Foot fracture, left 2011   GERD (gastroesophageal reflux disease)    Hyperlipidemia    Hypertension    Osteopenia 04/2019   T score -1.9 FRAX 10% / 1.9%.  Stable from prior DEXA   Pre-diabetes    PVD (peripheral vascular disease) (HCC)    mild carotid ? fibromuscular dysplasia   Shingles    Urinary incontinence       Past Surgical History:  Procedure Laterality Date   APPENDECTOMY     BREAST SURGERY     right breast biopsy   ESOPHAGOGASTRODUODENOSCOPY     ROTATOR CUFF REPAIR     RIGHT   ROTATOR CUFF REPAIR     Left   stress cardiolite  04-18-00   VAGINAL HYSTERECTOMY  1993   leiomyomata    Family History  Problem Relation Age of Onset   Glaucoma Mother    Heart disease Mother    Hypertension Mother    Heart disease Father    Hypertension Father    Stroke Father    Hypertension Paternal Grandmother    Lung cancer Paternal Grandmother    Hypertension Sister    Hypertension Brother    Glaucoma Brother    Colon cancer Neg Hx     Social History:  reports that she quit smoking about 52 years ago. Her smoking use included cigarettes. She has never used smokeless tobacco. She reports current alcohol use. She reports that she does not use drugs.  BMI: Estimated body mass index is 25.25 kg/m as calculated from the following:   Height as of this encounter: '4\' 11"'$  (1.499 m).   Weight as of this encounter:  56.7 kg.  Lab Results  Component Value Date   ALBUMIN 4.3 02/21/2022   Diabetes: Patient does not have a diagnosis of diabetes. Lab Results  Component Value Date   HGBA1C 5.7 (H) 07/05/2022     Smoking Status:       Medications Prior to Admission  Medication Sig Dispense Refill   acetaminophen (TYLENOL) 500 MG tablet Take 500 mg by mouth every 6 (six) hours as needed for moderate pain.     ALPRAZolam (XANAX) 0.25 MG tablet Take 1 tablet (0.25 mg total) by mouth 2 (two) times daily as needed for anxiety. 30 tablet 0   alum & mag hydroxide-simeth (MAALOX/MYLANTA) 200-200-20 MG/5ML suspension Take 15 mLs by mouth at bedtime as needed for indigestion or heartburn.     amLODipine (NORVASC) 10 MG tablet Take 1 tablet (10 mg total) by mouth daily. 90 tablet 3   aspirin 81 MG tablet Take 81 mg by mouth daily.     Calcium-Vitamin D-Vitamin K 650-12.5-40 MG-MCG-MCG CHEW Chew 1 tablet by mouth daily.     Cholecalciferol 2000 UNITS TABS Take 2,000 Units by mouth daily.     Coenzyme Q10 (CO Q-10 PO) Take 1 capsule by mouth daily.     Evolocumab (REPATHA SURECLICK) 633 MG/ML SOAJ Inject 1  pen into the skin every 14 (fourteen) days. 6 mL 3   fexofenadine (ALLEGRA) 180 MG tablet Take 180 mg by mouth daily.     FLUoxetine (PROZAC) 10 MG capsule TAKE 1 CAPSULE BY MOUTH  DAILY 90 capsule 3   fluticasone (FLONASE) 50 MCG/ACT nasal spray USE 2 SPRAYS NASALLY DAILY (Patient taking differently: Place 2 sprays into both nostrils daily as needed for allergies.) 48 g 3   irbesartan (AVAPRO) 300 MG tablet Take 0.5 tablets (150 mg total) by mouth 2 (two) times daily. 90 tablet 3   labetalol (NORMODYNE) 300 MG tablet TAKE 1 TABLET BY MOUTH  TWICE DAILY 180 tablet 3   Multiple Vitamins-Minerals (MULTIVITAMIN WOMEN 50+) TABS Take 1 tablet by mouth daily.     NONFORMULARY OR COMPOUNDED ITEM compounded vaginal estradiol cream 0.02% insert 1 gram vaginally twice a week at bedtime.  Disp:  60 grams. 60 each 1    pantoprazole (PROTONIX) 40 MG tablet TAKE 1 TABLET BY MOUTH  DAILY ANNUAL APPOINTMENT  DUE IN FEB 2023 MUST SEE  PROVIDER FOR FUTURE REFILLS 90 tablet 3   rosuvastatin (CRESTOR) 10 MG tablet Take 0.5 tablets (5 mg total) by mouth every other day. 8 tablet 2   traMADol-acetaminophen (ULTRACET) 37.5-325 MG tablet TAKE 1 TABLET BY MOUTH EVERY 6 HOURS AS NEEDED 100 tablet 1   LORazepam (ATIVAN) 0.5 MG tablet TAKE 1 TABLET BY MOUTH TWICE A DAY (Patient not taking: Reported on 06/30/2022) 60 tablet 3   valACYclovir (VALTREX) 1000 MG tablet Take 2 tablets at the earliest onset of cold sore symptoms.  Repeat with 2 tablets 12 hours later 12 tablet 1     Physical Exam: Right shoulder demonstrates painful and guarded motion as noted at her recent office visits.  She has globally decreased strength.  Neurovascular intact.  Examination otherwise as documented at her recent office visits.  Plain radiographs and advanced imaging confirm changes consistent with chronic rotator cuff tear arthropathy.  Vitals  Temp:  [97.4 F (36.3 C)] 97.4 F (36.3 C) (11/17 0606) Pulse Rate:  [58] 58 (11/17 0606) Resp:  [15] 15 (11/17 0606) BP: (114)/(56) 114/56 (11/17 0606) SpO2:  [97 %] 97 % (11/17 0606) Weight:  [56.7 kg] 56.7 kg (11/17 0527)  Assessment/Plan  Impression: Right shoulder rotator cuff tear arthropathy  Plan of Action: Procedure(s): REVERSE SHOULDER ARTHROPLASTY  Sharon Paul M Ziaire Bieser 07/15/2022, 6:38 AM Contact # [phone]

## 2022-07-15 NOTE — Anesthesia Postprocedure Evaluation (Signed)
Anesthesia Post Note  Patient: IT sales professional  Procedure(s) Performed: REVERSE SHOULDER ARTHROPLASTY (Right: Shoulder)     Patient location during evaluation: PACU Anesthesia Type: Regional and General Level of consciousness: awake Pain management: pain level controlled Vital Signs Assessment: post-procedure vital signs reviewed and stable Respiratory status: spontaneous breathing, nonlabored ventilation and respiratory function stable Cardiovascular status: blood pressure returned to baseline and stable Postop Assessment: no apparent nausea or vomiting Anesthetic complications: yes   Encounter Notable Events  Notable Event Outcome Phase Comment  Difficult to intubate - unexpected  Intraprocedure Filed from anesthesia note documentation.    Last Vitals:  Vitals:   07/15/22 1040 07/15/22 1130  BP: (!) 131/58 (!) 118/49  Pulse: 61 (!) 55  Resp: 16   Temp: (!) 36.4 C   SpO2: 98% 95%    Last Pain:  Vitals:   07/15/22 1130  TempSrc:   PainSc: 0-No pain                 Clydell Sposito P Jaylah Goodlow

## 2022-07-15 NOTE — Op Note (Addendum)
07/15/2022  9:23 AM  PATIENT:   Sharon Paul  76 y.o. female  PRE-OPERATIVE DIAGNOSIS:  Right shoulder rotator cuff tear arthropathy  POST-OPERATIVE DIAGNOSIS: Same  PROCEDURE: Right shoulder reverse arthroplasty utilizing a press-fit size 5 modular Arthrex stem with a neutral size 33 metaphysis, +3 polyethylene insert, 33/+4 glenosphere and a small/+2 baseplate  SURGEON:  Rakia Frayne, Metta Clines M.D.  ASSISTANTS: Jenetta Loges, PA-C  Jenetta Loges, PA-C was utilized as an Environmental consultant throughout this case, essential for help with positioning the patient, positioning extremity, tissue manipulation, implantation of the prosthesis, suture management, wound closure, and intraoperative decision-making.  ANESTHESIA:   General endotracheal and interscalene block with Exparel  EBL: 100 cc  SPECIMEN: None  Drains: None   PATIENT DISPOSITION:  PACU - hemodynamically stable.    PLAN OF CARE: Discharge to home after PACU  Brief history:  Patient is a 76 year old female female with a long history of progressively increasing right shoulder pain.  She has a remote history of an open rotator cuff repair.  Her symptoms have now deteriorated to the point that she is having significant impact on quality of life.  She is brought to the operating room at this time for planned right shoulder reverse arthroplasty.  Preoperatively, I counseled the patient regarding treatment options and risks versus benefits thereof.  Possible surgical complications were all reviewed including potential for bleeding, infection, neurovascular injury, persistent pain, loss of motion, anesthetic complication, failure of the implant, and possible need for additional surgery. They understand and accept and agrees with our planned procedure.  Procedure in detail:  After undergoing routine preop evaluation the patient received prophylactic antibiotics and interscalene block with Exparel was established in the holding area  by the anesthesia department.  Patient was subsequently placed spine on the operating table and underwent the smooth induction of a general endotracheal anesthesia.  Placed in the beachchair position and appropriately padded and protected.  The right shoulder girdle region was sterilely prepped and draped in standard fashion.  Timeout was called.  A deltopectoral approach to the right shoulder was made to an approximately 8 cm incision.  Skin flaps were elevated dissection carried deeply and the deltopectoral interval was developed from proximal to distal with the vein taken laterally.  The upper centimeter the pectoralis major tendon was tenotomized for exposure and adhesions were divided beneath the deltoid.  Conjoined tendon mobilized retracted medially.  Long head biceps tendon was then tenodesed at the upper border the pectoralis major tendon with proximal segment unroofed and excised.  The remnant of the rotator cuff was split from the apex of the bicipital groove to the base of the coracoid and the subscap was elevated from the lesser tuberosity using electrocautery the free margin was then tagged with a pair of suture tape sutures.  Capsular attachments were then divided from the anterior and inferior margins of the humeral neck and humeral head was then delivered through the wound.  An extra medullary guide was then used to outline the proposed humeral head resection which we performed with an oscillating saw at approximately 20 degrees of retroversion.  A metal cap was then placed over the cut proximal humeral surface and this was indeed noted to be a relatively small metaphyseal diameter.  The glenoid was then exposed and a guidepin directed into the center of the glenoid after the labrum was circumferentially excised.  The glenoid was then reamed with the central followed by the peripheral reamer to a stable subchondral bony bed and  all soft tissue debris was carefully removed.  Preparation completed  with the central drill and tapped for a 25 mm lag screw.  Our baseplate was then assembled and inserted with vancomycin powder applied to the threads of the lag screw and excellent purchase and fixation was achieved.  All of the peripheral locking screws were then placed using standard technique with excellent fixation.  A 33/+4 glenosphere was then impacted onto the baseplate and the central locking screw was placed.  We returned attention back to the humeral metaphysis and the canal was opened hand reaming up to size 6 and ultimately broaching up to a size modular 5 and then a 33 metaphyseal reamer was then used to prepare the metaphysis.  Initial attempts at reduction showed excessive tension and so we then again hand reamed and broached to a slightly deeper seating on the stem repeated preparation of the metaphysis and then with this we had excellent motion stability and soft tissue balance with a trial implant.  This point the trial was then removed.  The final implant size 5 modular stem with a neutral size 33 metaphysis was then assembled.  The canal was then irrigated cleaned and dried and vancomycin powder was applied into the canal and the final implant was then seated with excellent purchase.  Trial reduction showed excellent motion stability and soft tissue balance with a +3 poly.  The trial was removed the implant was cleaned and dried and the final +3 poly was impacted final reduction was performed with scheduled excellent motion stability and soft tissue balance all much to our satisfaction.  The wound was copiously irrigated.  Final hemostasis was obtained.  The subscapularis was then repaired back to the eyelets on the collar of the implant using the previously placed suture tape sutures.  Vancomycin powder was then spread liberally throughout the deep soft tissue planes.  The deltopectoral interval was reapproximated with a series of figure-of-eight and 1 Vicryl sutures.  2-0 Monocryl used to close  the subcu layer followed by intracuticular 3-0 Monocryl for the skin followed by Dermabond and Aquacel dressing.  The right arm was then placed into a sling and the patient was awakened, extubated, and taken to the recovery room in stable condition.  Marin Shutter MD    Contact # [phone]

## 2022-07-15 NOTE — Evaluation (Signed)
Occupational Therapy Evaluation Patient Details Name: Arta Stump MRN: [id number] DOB: Mar 07, 1946 Today's Date: 07/15/2022   History of Present Illness Mrs. Yera is a 76 yr old female who is s/p a R shoulder  reverse arthroplasty on 07-15-2022, due to R shoulder rotator cuff tear arthopathy.   Clinical Impression   Pt is s/p shoulder replacement without functional use of right dominant upper extremity secondary to effects of surgery and interscalene block and shoulder precautions. Therapist provided education and instruction to patient and spouse with regards to exercises, post-op precautions, UE positioning, correctly donning upper extremity clothing and bathing recommendations, while maintaining shoulder precautions, use of ice for edema and pain management and correctly donning/doffing sling. Patient and spouse verbalized understanding and demonstrated understanding as needed. Patient needed assistance to donn shirt, underwear, pants, and shoes with instruction provided on compensatory strategies to perform ADLs. Patient to follow up with MD for further therapy needs.        Recommendations for follow up therapy are one component of a multi-disciplinary discharge planning process, led by the attending physician.  Recommendations may be updated based on patient status, additional functional criteria and insurance authorization.   Follow Up Recommendations  Follow physician's recommendations for discharge plan and follow up therapies     Assistance Recommended at Discharge Intermittent Supervision/Assistance  Patient can return home with the following Assistance with cooking/housework;A little help with bathing/dressing/bathroom    Functional Status Assessment  Patient has had a recent decline in their functional status and demonstrates the ability to make significant improvements in function in a reasonable and predictable amount of time.  Equipment Recommendations   None recommended by OT       Precautions / Restrictions Precautions Precautions: Shoulder Shoulder Interventions: Shoulder sling/immobilizer Precaution Booklet Issued: Yes (comment) Precaution Comments: If sitting in controlled environment, ok to come out of sling to give neck a break. Please sleep in it to protect until follow up in office.     OK to use operative arm for feeding, hygiene and ADLs.   Ok to instruct Pendulums and lap slides as exercises. Ok to use operative arm within the following parameters for ADL purposes     New ROM (8/18)   Ok for PROM, AAROM, AROM within pain tolerance and within the following ROM   ER 20   ABD 45   FE 60 Required Braces or Orthoses: Sling Restrictions Weight Bearing Restrictions: Yes RUE Weight Bearing: Non weight bearing      Mobility Bed Mobility    General bed mobility comments: pt was received seated in the bedside chair    Transfers Overall transfer level: Needs assistance   Transfers: Sit to/from Stand Sit to Stand: Supervision              ADL either performed or assessed with clinical judgement      Vision Patient Visual Report: No change from baseline              Pertinent Vitals/Pain Pain Assessment Pain Assessment: No/denies pain     Hand Dominance Right      Communication Communication Communication: No difficulties   Cognition Arousal/Alertness: Awake/alert Behavior During Therapy: WFL for tasks assessed/performed Overall Cognitive Status: Within Functional Limits for tasks assessed        General Comments: Oriented x4, able to follow commands without difficulty           Shoulder Instructions Shoulder Instructions Donning/doffing shirt without moving shoulder: Supervision/safety (for caregiver) Method for sponge bathing under  operated UE: Caregiver independent with task Donning/doffing sling/immobilizer: Supervision/safety (for caregiver) Correct positioning of sling/immobilizer: Caregiver  independent with task Pendulum exercises (written home exercise program): Caregiver independent with task ROM for elbow, wrist and digits of operated UE: Caregiver independent with task Sling wearing schedule (on at all times/off for ADL's): Caregiver independent with task Proper positioning of operated UE when showering: Caregiver independent with task Dressing change: Caregiver independent with task Positioning of UE while sleeping: Caregiver independent with task    Home Living Family/patient expects to be discharged to:: Private residence Living Arrangements: Spouse/significant other   Type of Home: House Home Access: Stairs to enter Technical brewer of Steps: 1   Home Layout: One level      Home Equipment: None          Prior Functioning/Environment Prior Level of Function : Independent/Modified Independent             Mobility Comments: Independent with ambulation. ADLs Comments: Independent with ADLs, cooking, cleaning, and driving.        OT Problem List: Impaired UE functional use;Decreased range of motion          AM-PAC OT "6 Clicks" Daily Activity     Outcome Measure Help from another person eating meals?: None Help from another person taking care of personal grooming?: None Help from another person toileting, which includes using toliet, bedpan, or urinal?: A Little Help from another person bathing (including washing, rinsing, drying)?: A Little Help from another person to put on and taking off regular upper body clothing?: A Little Help from another person to put on and taking off regular lower body clothing?: A Little 6 Click Score: 20   End of Session Nurse Communication: Other (comment) (patient received necessary education)  Activity Tolerance: Patient tolerated treatment well Patient left: in chair;with family/visitor present  OT Visit Diagnosis: Muscle weakness (generalized) (M62.81)                Time: 9833-8250 OT Time  Calculation (min): 27 min Charges:  OT General Charges $OT Visit: 1 Visit OT Evaluation $OT Eval Low Complexity: 1 Low OT Treatments $Self Care/Home Management : 8-22 mins    Leota Sauers, OTR/L 07/15/2022, 1:15 PM

## 2022-07-18 ENCOUNTER — Encounter (HOSPITAL_COMMUNITY): Payer: Self-pay | Admitting: Orthopedic Surgery

## 2022-07-26 DIAGNOSIS — Z96611 Presence of right artificial shoulder joint: Secondary | ICD-10-CM | POA: Insufficient documentation

## 2022-07-27 ENCOUNTER — Ambulatory Visit: Payer: Medicare Other | Admitting: Internal Medicine

## 2022-07-27 ENCOUNTER — Other Ambulatory Visit: Payer: Self-pay | Admitting: Internal Medicine

## 2022-07-27 DIAGNOSIS — Z96611 Presence of right artificial shoulder joint: Secondary | ICD-10-CM | POA: Diagnosis not present

## 2022-08-11 ENCOUNTER — Other Ambulatory Visit: Payer: Self-pay | Admitting: Internal Medicine

## 2022-08-11 DIAGNOSIS — I739 Peripheral vascular disease, unspecified: Secondary | ICD-10-CM

## 2022-08-11 DIAGNOSIS — E785 Hyperlipidemia, unspecified: Secondary | ICD-10-CM

## 2022-08-12 DIAGNOSIS — M25511 Pain in right shoulder: Secondary | ICD-10-CM | POA: Diagnosis not present

## 2022-08-17 DIAGNOSIS — M25511 Pain in right shoulder: Secondary | ICD-10-CM | POA: Diagnosis not present

## 2022-08-19 DIAGNOSIS — M25511 Pain in right shoulder: Secondary | ICD-10-CM | POA: Diagnosis not present

## 2022-08-23 DIAGNOSIS — M25511 Pain in right shoulder: Secondary | ICD-10-CM | POA: Diagnosis not present

## 2022-08-24 ENCOUNTER — Ambulatory Visit: Payer: Medicare Other

## 2022-08-26 DIAGNOSIS — M25511 Pain in right shoulder: Secondary | ICD-10-CM | POA: Diagnosis not present

## 2022-08-31 ENCOUNTER — Ambulatory Visit (INDEPENDENT_AMBULATORY_CARE_PROVIDER_SITE_OTHER): Payer: Medicare Other

## 2022-08-31 ENCOUNTER — Telehealth: Payer: Self-pay

## 2022-08-31 VITALS — Ht 59.0 in | Wt 125.0 lb

## 2022-08-31 DIAGNOSIS — Z0000 Encounter for general adult medical examination without abnormal findings: Secondary | ICD-10-CM

## 2022-08-31 NOTE — Telephone Encounter (Signed)
Called patient lvm to return call, to complete AWV at [phone].  If no return call within 15 minutes, patient may reschedule for the next available appointment with [facility] or CMA. -S. Joaopedro Eschbach,LPN 

## 2022-08-31 NOTE — Patient Instructions (Addendum)
[name] , Thank you for taking time to come for your Medicare Wellness Visit. I appreciate your ongoing commitment to your health goals. Please review the following plan we discussed and let me know if I can assist you in the future.   These are the goals we discussed:  Goals       Patient Stated      I want to increase physical activity by walking more routinely with my dog.      Patient Stated      My goal is to lose some weight and continue to be active and exercise everyday.      Stay Healthy (pt-stated)      Track and Manage My Blood Pressure-Hypertension      Timeframe:  Long-Range Goal Priority:  High Start Date: 01/21/2021                            Expected End Date: 07/24/2021                      Follow Up Date 04/23/2021   - check blood pressure weekly - write blood pressure results in a log or diary    Why is this important?   You won't feel high blood pressure, but it can still hurt your blood vessels.  High blood pressure can cause heart or kidney problems. It can also cause a stroke.  Making lifestyle changes like losing a little weight or eating less salt will help.  Checking your blood pressure at home and at different times of the day can help to control blood pressure.  If the doctor prescribes medicine remember to take it the way the doctor ordered.  Call the office if you cannot afford the medicine or if there are questions about it.     Notes:         This is a list of the screening recommended for you and due dates:  Health Maintenance  Topic Date Due   COVID-19 Vaccine (4 - 2023-24 season) 09/16/2022*   Colon Cancer Screening  03/07/2023   Medicare Annual Wellness Visit  09/01/2023   DTaP/Tdap/Td vaccine (3 - Td or Tdap) 10/05/2025   Pneumonia Vaccine  Completed   Flu Shot  Completed   DEXA scan (bone density measurement)  Completed   Hepatitis C Screening: USPSTF Recommendation to screen - Ages 55-79 yo.  Completed   Zoster  (Shingles) Vaccine  Completed   HPV Vaccine  Aged Out  *Topic was postponed. The date shown is not the original due date.   Opioid Pain Medicine Management Opioids are powerful medicines that are used to treat moderate to severe pain. When used for short periods of time, they can help you to: Sleep better. Do better in physical or occupational therapy. Feel better in the first few days after an injury. Recover from surgery. Opioids should be taken with the supervision of a trained health care provider. They should be taken for the shortest period of time possible. This is because opioids can be addictive, and the longer you take opioids, the greater your risk of addiction. This addiction can also be called opioid use disorder. What are the risks? Using opioid pain medicines for longer than 3 days increases your risk of side effects. Side effects include: Constipation. Nausea and vomiting. Breathing difficulties (respiratory depression). Drowsiness. Confusion. Opioid use disorder. Itching. Taking opioid pain medicine for a long period of  time can affect your ability to do daily tasks. It also puts you at risk for: Motor vehicle crashes. Depression. Suicide. Heart attack. Overdose, which can be life-threatening. What is a pain treatment plan? A pain treatment plan is an agreement between you and your health care provider. Pain is unique to each person, and treatments vary depending on your condition. To manage your pain, you and your health care provider need to work together. To help you do this: Discuss the goals of your treatment, including how much pain you might expect to have and how you will manage the pain. Review the risks and benefits of taking opioid medicines. Remember that a good treatment plan uses more than one approach and minimizes the chance of side effects. Be honest about the amount of medicines you take and about any drug or alcohol use. Get pain medicine  prescriptions from only one health care provider. Pain can be managed with many types of alternative treatments. Ask your health care provider to refer you to one or more specialists who can help you manage pain through: Physical or occupational therapy. Counseling (cognitive behavioral therapy). Good nutrition. Biofeedback. Massage. Meditation. Non-opioid medicine. Following a gentle exercise program. How to use opioid pain medicine Taking medicine Take your pain medicine exactly as told by your health care provider. Take it only when you need it. If your pain gets less severe, you may take less than your prescribed dose if your health care provider approves. If you are not having pain, do nottake pain medicine unless your health care provider tells you to take it. If your pain is severe, do nottry to treat it yourself by taking more pills than instructed on your prescription. Contact your health care provider for help. Write down the times when you take your pain medicine. It is easy to become confused while on pain medicine. Writing the time can help you avoid overdose. Take other over-the-counter or prescription medicines only as told by your health care provider. Keeping yourself and others safe  While you are taking opioid pain medicine: Do not drive, use machinery, or power tools. Do not sign legal documents. Do not drink alcohol. Do not take sleeping pills. Do not supervise children by yourself. Do not do activities that require climbing or being in high places. Do not go to a lake, river, ocean, spa, or swimming pool. Do not share your pain medicine with anyone. Keep pain medicine in a locked cabinet or in a secure area where pets and children cannot reach it. Stopping your use of opioids If you have been taking opioid medicine for more than a few weeks, you may need to slowly decrease (taper) how much you take until you stop completely. Tapering your use of opioids can  decrease your risk of symptoms of withdrawal, such as: Pain and cramping in the abdomen. Nausea. Sweating. Sleepiness. Restlessness. Uncontrollable shaking (tremors). Cravings for the medicine. Do not attempt to taper your use of opioids on your own. Talk with your health care provider about how to do this. Your health care provider may prescribe a step-down schedule based on how much medicine you are taking and how long you have been taking it. Getting rid of leftover pills Do not save any leftover pills. Get rid of leftover pills safely by: Taking the medicine to a prescription take-back program. This is usually offered by the county or law enforcement. Bringing them to a pharmacy that has a drug disposal container. Flushing them down the toilet.  Check the label or package insert of your medicine to see whether this is safe to do. Throwing them out in the trash. Check the label or package insert of your medicine to see whether this is safe to do. If it is safe to throw it out, remove the medicine from the original container, put it into a sealable bag or container, and mix it with used coffee grounds, food scraps, dirt, or cat litter before putting it in the trash. Follow these instructions at home: Activity Do exercises as told by your health care provider. Avoid activities that make your pain worse. Return to your normal activities as told by your health care provider. Ask your health care provider what activities are safe for you. General instructions You may need to take these actions to prevent or treat constipation: Drink enough fluid to keep your urine pale yellow. Take over-the-counter or prescription medicines. Eat foods that are high in fiber, such as beans, whole grains, and fresh fruits and vegetables. Limit foods that are high in fat and processed sugars, such as fried or sweet foods. Keep all follow-up visits. This is important. Where to find support If you have been  taking opioids for a long time, you may benefit from receiving support for quitting from a local support group or counselor. Ask your health care provider for a referral to these resources in your area. Where to find more information Centers for Disease Control and Prevention (CDC): http://www.wolf.info/ [place] Food and Drug Administration (FDA): [place] Get help right away if: You may have taken too much of an opioid (overdosed). Common symptoms of an overdose: Your breathing is slower or more shallow than normal. You have a very slow heartbeat (pulse). You have slurred speech. You have nausea and vomiting. Your pupils become very small. You have other potential symptoms: You are very confused. You faint or feel like you will faint. You have cold, clammy skin. You have blue lips or fingernails. You have thoughts of harming yourself or harming others. These symptoms may represent a serious problem that is an emergency. Do not wait to see if the symptoms will go away. Get medical help right away. Call your local emergency services (911 in the [place] Do not drive yourself to the hospital.  If you ever feel like you may hurt yourself or others, or have thoughts about taking your own life, get help right away. Go to your nearest emergency department or: Call your local emergency services (911 in the [place] Call the [facility] 919-198-4168 in the [place] Call a suicide crisis helpline, such as the [place] at (548)467-0965 or 988 in the [place]. This is open 24 hours a day in the [place] Text the Crisis Text Line at 475-205-9203 (in the [place].). Summary Opioid medicines can help you manage moderate to severe pain for a short period of time. A pain treatment plan is an agreement between you and your health care provider. Discuss the goals of your treatment, including how much pain you might expect to have and how you will manage the pain. If you think that you or  someone else may have taken too much of an opioid, get medical help right away. This information is not intended to replace advice given to you by your health care provider. Make sure you discuss any questions you have with your health care provider. Document Revised: 03/10/2021 Document Reviewed: 11/25/2020 Elsevier Patient Education  [place] directives: Advance directive  discussed with you today. Even though you declined this today, please call our office should you change your mind, and we can give you the proper paperwork for you to fill out.   Conditions/risks identified: None  Next appointment: Follow up in one year for your annual wellness visit     Preventive Care 65 Years and Older, Female Preventive care refers to lifestyle choices and visits with your health care provider that can promote health and wellness. What does preventive care include? A yearly physical exam. This is also called an annual well check. Dental exams once or twice a year. Routine eye exams. Ask your health care provider how often you should have your eyes checked. Personal lifestyle choices, including: Daily care of your teeth and gums. Regular physical activity. Eating a healthy diet. Avoiding tobacco and drug use. Limiting alcohol use. Practicing safe sex. Taking low-dose aspirin every day. Taking vitamin and mineral supplements as recommended by your health care provider. What happens during an annual well check? The services and screenings done by your health care provider during your annual well check will depend on your age, overall health, lifestyle risk factors, and family history of disease. Counseling  Your health care provider may ask you questions about your: Alcohol use. Tobacco use. Drug use. Emotional well-being. Home and relationship well-being. Sexual activity. Eating habits. History of falls. Memory and ability to understand (cognition). Work and work  Statistician. Reproductive health. Screening  You may have the following tests or measurements: Height, weight, and BMI. Blood pressure. Lipid and cholesterol levels. These may be checked every 5 years, or more frequently if you are over 17 years old. Skin check. Lung cancer screening. You may have this screening every year starting at age 55 if you have a 30-pack-year history of smoking and currently smoke or have quit within the past 15 years. Fecal occult blood test (FOBT) of the stool. You may have this test every year starting at age 86. Flexible sigmoidoscopy or colonoscopy. You may have a sigmoidoscopy every 5 years or a colonoscopy every 10 years starting at age 28. Hepatitis C blood test. Hepatitis B blood test. Sexually transmitted disease (STD) testing. Diabetes screening. This is done by checking your blood sugar (glucose) after you have not eaten for a while (fasting). You may have this done every 1-3 years. Bone density scan. This is done to screen for osteoporosis. You may have this done starting at age 68. Mammogram. This may be done every 1-2 years. Talk to your health care provider about how often you should have regular mammograms. Talk with your health care provider about your test results, treatment options, and if necessary, the need for more tests. Vaccines  Your health care provider may recommend certain vaccines, such as: Influenza vaccine. This is recommended every year. Tetanus, diphtheria, and acellular pertussis (Tdap, Td) vaccine. You may need a Td booster every 10 years. Zoster vaccine. You may need this after age 22. Pneumococcal 13-valent conjugate (PCV13) vaccine. One dose is recommended after age 42. Pneumococcal polysaccharide (PPSV23) vaccine. One dose is recommended after age 38. Talk to your health care provider about which screenings and vaccines you need and how often you need them. This information is not intended to replace advice given to you by  your health care provider. Make sure you discuss any questions you have with your health care provider. Document Released: 09/11/2015 Document Revised: 05/04/2016 Document Reviewed: 06/16/2015 Elsevier Interactive Patient Education  2017 [place] Prevention in  the Home Falls can cause injuries. They can happen to people of all ages. There are many things you can do to make your home safe and to help prevent falls. What can I do on the outside of my home? Regularly fix the edges of walkways and driveways and fix any cracks. Remove anything that might make you trip as you walk through a door, such as a raised step or threshold. Trim any bushes or trees on the path to your home. Use bright outdoor lighting. Clear any walking paths of anything that might make someone trip, such as rocks or tools. Regularly check to see if handrails are loose or broken. Make sure that both sides of any steps have handrails. Any raised decks and porches should have guardrails on the edges. Have any leaves, snow, or ice cleared regularly. Use sand or salt on walking paths during winter. Clean up any spills in your garage right away. This includes oil or grease spills. What can I do in the bathroom? Use night lights. Install grab bars by the toilet and in the tub and shower. Do not use towel bars as grab bars. Use non-skid mats or decals in the tub or shower. If you need to sit down in the shower, use a plastic, non-slip stool. Keep the floor dry. Clean up any water that spills on the floor as soon as it happens. Remove soap buildup in the tub or shower regularly. Attach bath mats securely with double-sided non-slip rug tape. Do not have throw rugs and other things on the floor that can make you trip. What can I do in the bedroom? Use night lights. Make sure that you have a light by your bed that is easy to reach. Do not use any sheets or blankets that are too big for your bed. They should not hang  down onto the floor. Have a firm chair that has side arms. You can use this for support while you get dressed. Do not have throw rugs and other things on the floor that can make you trip. What can I do in the kitchen? Clean up any spills right away. Avoid walking on wet floors. Keep items that you use a lot in easy-to-reach places. If you need to reach something above you, use a strong step stool that has a grab bar. Keep electrical cords out of the way. Do not use floor polish or wax that makes floors slippery. If you must use wax, use non-skid floor wax. Do not have throw rugs and other things on the floor that can make you trip. What can I do with my stairs? Do not leave any items on the stairs. Make sure that there are handrails on both sides of the stairs and use them. Fix handrails that are broken or loose. Make sure that handrails are as long as the stairways. Check any carpeting to make sure that it is firmly attached to the stairs. Fix any carpet that is loose or worn. Avoid having throw rugs at the top or bottom of the stairs. If you do have throw rugs, attach them to the floor with carpet tape. Make sure that you have a light switch at the top of the stairs and the bottom of the stairs. If you do not have them, ask someone to add them for you. What else can I do to help prevent falls? Wear shoes that: Do not have high heels. Have rubber bottoms. Are comfortable and fit you well. Are closed  at the toe. Do not wear sandals. If you use a stepladder: Make sure that it is fully opened. Do not climb a closed stepladder. Make sure that both sides of the stepladder are locked into place. Ask someone to hold it for you, if possible. Clearly mark and make sure that you can see: Any grab bars or handrails. First and last steps. Where the edge of each step is. Use tools that help you move around (mobility aids) if they are needed. These  include: Canes. Walkers. Scooters. Crutches. Turn on the lights when you go into a dark area. Replace any light bulbs as soon as they burn out. Set up your furniture so you have a clear path. Avoid moving your furniture around. If any of your floors are uneven, fix them. If there are any pets around you, be aware of where they are. Review your medicines with your doctor. Some medicines can make you feel dizzy. This can increase your chance of falling. Ask your doctor what other things that you can do to help prevent falls. This information is not intended to replace advice given to you by your health care provider. Make sure you discuss any questions you have with your health care provider. Document Released: 06/11/2009 Document Revised: 01/21/2016 Document Reviewed: 09/19/2014 Elsevier Interactive Patient Education  2017 [employer].

## 2022-08-31 NOTE — Progress Notes (Cosign Needed Addendum)
Subjective:   Sharon Paul is a 77 y.o. female who presents for Medicare Annual (Subsequent) preventive examination.  Review of Systems    Virtual Visit via Telephone Note  I connected with  Sharon Paul on 08/31/22 at  3:00 PM EST by telephone and verified that I am speaking with the correct person using two identifiers.  Location: Patient: Home Provider: Office Persons participating in the virtual visit: patient/Nurse Health Advisor   I discussed the limitations, risks, security and privacy concerns of performing an evaluation and management service by telephone and the availability of in person appointments. The patient expressed understanding and agreed to proceed.  Interactive audio and video telecommunications were attempted between this nurse and patient, however failed, due to patient having technical difficulties OR patient did not have access to video capability.  We continued and completed visit with audio only.  Some vital signs may be absent or patient reported.   Criselda Peaches, LPN  Cardiac Risk Factors include: advanced age (>35mn, >>52women);hypertension     Objective:    Today's Vitals   08/31/22 1605  Weight: 125 lb (56.7 kg)  Height: '4\' 11"'$  (1.499 m)   Body mass index is 25.25 kg/m.     08/31/2022    4:15 PM 07/05/2022    2:32 PM 08/05/2021    2:43 PM 06/14/2021    4:56 PM 06/11/2020    3:47 PM 05/29/2018    2:38 PM  Advanced Directives  Does Patient Have a Medical Advance Directive? No No No No No No  Does patient want to make changes to medical advance directive?      Yes (ED - Information included in AVS)  Would patient like information on creating a medical advance directive? No - Patient declined No - Patient declined No - Patient declined Yes (MAU/Ambulatory/Procedural Areas - Information given) Yes (MAU/Ambulatory/Procedural Areas - Information given)     Current Medications (verified) Outpatient Encounter Medications as  of 08/31/2022  Medication Sig   acetaminophen (TYLENOL) 500 MG tablet Take 500 mg by mouth every 6 (six) hours as needed for moderate pain.   ALPRAZolam (XANAX) 0.25 MG tablet Take 1 tablet (0.25 mg total) by mouth 2 (two) times daily as needed for anxiety.   alum & mag hydroxide-simeth (MAALOX/MYLANTA) 200-200-20 MG/5ML suspension Take 15 mLs by mouth at bedtime as needed for indigestion or heartburn.   amLODipine (NORVASC) 10 MG tablet Take 1 tablet (10 mg total) by mouth daily.   aspirin 81 MG tablet Take 81 mg by mouth daily.   Calcium-Vitamin D-Vitamin K 650-12.5-40 MG-MCG-MCG CHEW Chew 1 tablet by mouth daily.   Cholecalciferol 2000 UNITS TABS Take 2,000 Units by mouth daily.   Coenzyme Q10 (CO Q-10 PO) Take 1 capsule by mouth daily.   Evolocumab (REPATHA SURECLICK) 1086MG/ML SOAJ INJECT 1 PEN INTO THE SKIN EVERY 14 (FOURTEEN) DAYS.   fexofenadine (ALLEGRA) 180 MG tablet Take 180 mg by mouth daily.   FLUoxetine (PROZAC) 10 MG capsule TAKE 1 CAPSULE BY MOUTH  DAILY   fluticasone (FLONASE) 50 MCG/ACT nasal spray USE 2 SPRAYS NASALLY DAILY (Patient taking differently: Place 2 sprays into both nostrils daily as needed for allergies.)   ipratropium (ATROVENT) 0.06 % nasal spray PLACE 2 SPRAYS INTO THE NOSE 3 (THREE) TIMES DAILY.   irbesartan (AVAPRO) 300 MG tablet Take 0.5 tablets (150 mg total) by mouth 2 (two) times daily.   labetalol (NORMODYNE) 300 MG tablet TAKE 1 TABLET BY MOUTH  TWICE DAILY  LORazepam (ATIVAN) 0.5 MG tablet TAKE 1 TABLET BY MOUTH TWICE A DAY (Patient not taking: Reported on 06/30/2022)   meloxicam (MOBIC) 15 MG tablet Take 1 tablet (15 mg total) by mouth daily.   Multiple Vitamins-Minerals (MULTIVITAMIN WOMEN 50+) TABS Take 1 tablet by mouth daily.   NONFORMULARY OR COMPOUNDED ITEM compounded vaginal estradiol cream 0.02% insert 1 gram vaginally twice a week at bedtime.  Disp:  60 grams.   ondansetron (ZOFRAN) 4 MG tablet Take 1 tablet (4 mg total) by mouth every 8 (eight)  hours as needed for nausea or vomiting.   oxyCODONE-acetaminophen (PERCOCET) 5-325 MG tablet Take 1 tablet by mouth every 4 (four) hours as needed (max 6 q).   pantoprazole (PROTONIX) 40 MG tablet TAKE 1 TABLET BY MOUTH  DAILY ANNUAL APPOINTMENT  DUE IN FEB 2023 MUST SEE  PROVIDER FOR FUTURE REFILLS   rosuvastatin (CRESTOR) 10 MG tablet Take 0.5 tablets (5 mg total) by mouth every other day.   tiZANidine (ZANAFLEX) 4 MG tablet Take 1 tablet (4 mg total) by mouth every 8 (eight) hours as needed for muscle spasms.   traMADol-acetaminophen (ULTRACET) 37.5-325 MG tablet TAKE 1 TABLET BY MOUTH EVERY 6 HOURS AS NEEDED   valACYclovir (VALTREX) 1000 MG tablet Take 2 tablets at the earliest onset of cold sore symptoms.  Repeat with 2 tablets 12 hours later   No facility-administered encounter medications on file as of 08/31/2022.    Allergies (verified) Augmentin [amoxicillin-pot clavulanate], Clarithromycin, Codeine, Esomeprazole magnesium, Hydrochlorothiazide, Iohexol, Losartan potassium, Olmesartan medoxomil, Ramipril, Risedronate sodium, and Kapidex [dexlansoprazole]   History: Past Medical History:  Diagnosis Date   Anxiety    Arthritis    Celiac sprue 2007   CVD (cardiovascular disease)    Foot fracture, left 2011   GERD (gastroesophageal reflux disease)    Hyperlipidemia    Hypertension    Osteopenia 04/2019   T score -1.9 FRAX 10% / 1.9%.  Stable from prior DEXA   Pre-diabetes    PVD (peripheral vascular disease) (HCC)    mild carotid ? fibromuscular dysplasia   Shingles    Urinary incontinence    Past Surgical History:  Procedure Laterality Date   APPENDECTOMY     BREAST SURGERY     right breast biopsy   ESOPHAGOGASTRODUODENOSCOPY     REVERSE SHOULDER ARTHROPLASTY Right 07/15/2022   Procedure: REVERSE SHOULDER ARTHROPLASTY;  Surgeon: Justice Britain, MD;  Location: [facility];  Service: Orthopedics;  Laterality: Right;  160mn   ROTATOR CUFF REPAIR     RIGHT   ROTATOR CUFF REPAIR      Left   stress cardiolite  04-18-00   VAGINAL HYSTERECTOMY  1993   leiomyomata   Family History  Problem Relation Age of Onset   Glaucoma Mother    Heart disease Mother    Hypertension Mother    Heart disease Father    Hypertension Father    Stroke Father    Hypertension Paternal Grandmother    Lung cancer Paternal Grandmother    Hypertension Sister    Hypertension Brother    Glaucoma Brother    Colon cancer Neg Hx    Social History   Socioeconomic History   Marital status: Married    Spouse name: DTheotis Burrow  Number of children: 2   Years of education: Not on file   Highest education level: Not on file  Occupational History   Occupation: semi-retired  Tobacco Use   Smoking status: Former    Types: Cigarettes  Quit date: 02/11/1970    Years since quitting: 52.5   Smokeless tobacco: Never  Vaping Use   Vaping Use: Never used  Substance and Sexual Activity   Alcohol use: Yes    Alcohol/week: 0.0 standard drinks of alcohol    Comment: very rare   Drug use: No   Sexual activity: Yes    Birth control/protection: Surgical, Post-menopausal    Comment: 1st intercourse 77 yo-Fewer than 5 partners,des neg  Other Topics Concern   Not on file  Social History Narrative   Not on file   Social Determinants of Health   Financial Resource Strain: Low Risk  (08/31/2022)   Overall Financial Resource Strain (CARDIA)    Difficulty of Paying Living Expenses: Not hard at all  Food Insecurity: No Food Insecurity (08/31/2022)   Hunger Vital Sign    Worried About Running Out of Food in the Last Year: Never true    Ran Out of Food in the Last Year: Never true  Transportation Needs: No Transportation Needs (08/31/2022)   PRAPARE - Hydrologist (Medical): No    Lack of Transportation (Non-Medical): No  Physical Activity: Insufficiently Active (08/31/2022)   Exercise Vital Sign    Days of Exercise per Week: 3 days    Minutes of Exercise per Session: 30  min  Stress: No Stress Concern Present (08/31/2022)   [place]    Feeling of Stress : Not at all  Social Connections: [place] (08/31/2022)   Social Connection and Isolation Panel [NHANES]    Frequency of Communication with Friends and Family: More than three times a week    Frequency of Social Gatherings with Friends and Family: More than three times a week    Attends Religious Services: More than 4 times per year    Active Member of [employer] or Organizations: Yes    Attends Music therapist: More than 4 times per year    Marital Status: Married    Tobacco Counseling Counseling given: Not Answered   Clinical Intake:  Pre-visit preparation completed: No  Pain : No/denies pain     BMI - recorded: 25.25 Nutritional Status: BMI 25 -29 Overweight Nutritional Risks: None Diabetes: No  How often do you need to have someone help you when you read instructions, pamphlets, or other written materials from your doctor or pharmacy?: 1 - Never  Diabetic?  No  Interpreter Needed?: No  Information entered by :: Rolene Arbour LPN   Activities of Daily Living    08/31/2022    4:13 PM 07/05/2022    2:34 PM  In your present state of health, do you have any difficulty performing the following activities:  Hearing? 0   Vision? 0   Difficulty concentrating or making decisions? 0   Walking or climbing stairs? 0   Dressing or bathing? 0   Doing errands, shopping? 0 0  Preparing Food and eating ? N   Using the Toilet? N   In the past six months, have you accidently leaked urine? Y   Comment Wears pads on occasions   Do you have problems with loss of bowel control? N   Managing your Medications? N   Managing your Finances? N   Housekeeping or managing your Housekeeping? N     Patient Care Team: Plotnikov, Evie Lacks, MD as PCP - General Fay Records, MD as PCP - Cardiology  (Cardiology) Armbruster, Carlota Raspberry, MD as  Consulting Physician (Gastroenterology) Justice Britain, MD as Consulting Physician (Orthopedic Surgery) [facility] as Consulting Physician (Ophthalmology) Szabat, Darnelle Maffucci, [facility] (Inactive) (Pharmacist) Szabat, Darnelle Maffucci, [facility] (Inactive) as Pharmacist (Pharmacist)  Indicate any recent Medical Services you may have received from other than [facility] providers in the past year (date may be approximate).     Assessment:   This is a routine wellness examination for Jaylea.  Hearing/Vision screen Hearing Screening - Comments:: Denies hearing difficulties   Vision Screening - Comments:: Wears rx glasses - up to date with routine eye exams with  Dr Katy Fitch  Dietary issues and exercise activities discussed: Exercise limited by: None identified   Goals Addressed               This Visit's Progress     Stay Healthy (pt-stated)         Depression Screen    06/14/2021    4:55 PM 01/21/2021    2:54 PM 12/22/2020    4:03 PM 10/13/2020   10:11 AM 06/11/2020    3:44 PM 10/09/2019   12:51 PM 06/06/2019    3:43 PM  PHQ 2/9 Scores  PHQ - 2 Score 0 0 0 0 0 0 0  PHQ- 9 Score  0         Fall Risk    08/31/2022    4:14 PM 06/14/2021    5:00 PM 12/22/2020    4:03 PM 10/13/2020   10:11 AM 06/11/2020    3:47 PM  [place] in the past year? 0 0 0 0 0  Number falls in past yr: 0 0 0 0 0  Injury with Fall? 0 0 0 0 0  Risk for fall due to : No Fall Risks No Fall Risks No Fall Risks No Fall Risks No Fall Risks  Follow up Falls prevention discussed Falls evaluation completed   Falls evaluation completed;Education provided    FALL RISK PREVENTION PERTAINING TO THE HOME:  Any stairs in or around the home? No  If so, are there any without handrails? No  Home free of loose throw rugs in walkways, pet beds, electrical cords, etc? Yes  Adequate lighting in your home to reduce risk of falls? Yes   ASSISTIVE DEVICES UTILIZED TO PREVENT  FALLS:  Life alert? No  Use of a cane, walker or w/c? No  Grab bars in the bathroom? Yes Shower chair or bench in shower? No  Elevated toilet seat or a handicapped toilet? No   TIMED UP AND GO:  Was the test performed? No . Audio Visit  Cognitive Function:        08/31/2022    4:15 PM 06/11/2020    3:52 PM 06/06/2019    3:46 PM  6CIT Screen  What Year? 0 points 0 points 0 points  What month? 0 points 0 points 0 points  What time? 0 points 0 points 0 points  Count back from 20 0 points 0 points 0 points  Months in reverse 0 points 0 points 0 points  Repeat phrase 0 points 0 points 0 points  Total Score 0 points 0 points 0 points    Immunizations Immunization History  Administered Date(s) Administered   Fluad Quad(high Dose 65+) 06/06/2019, 06/11/2020, 06/14/2021, 06/10/2022   Influenza Split 07/04/2012   Influenza Whole 07/02/2008, 06/01/2010   Influenza, High Dose Seasonal PF 05/31/2016, 05/26/2017, 05/29/2018   Influenza,inj,Quad PF,6+ Mos 06/02/2015   Influenza,inj,quad, With Preservative 05/29/2017, 05/29/2018, 05/30/2019  Influenza-Unspecified 06/16/2013, 05/29/2014   PFIZER(Purple Top)SARS-COV-2 Vaccination 11/13/2019, 12/04/2019, 07/11/2020   PNEUMOCOCCAL CONJUGATE-20 04/28/2022   Pneumococcal Conjugate-13 01/14/2015   Pneumococcal Polysaccharide-23 12/04/2013   Td 08/02/2000   Tdap 10/06/2015   Zoster Recombinat (Shingrix) 05/11/2020, 12/14/2020   Zoster, Live 09/21/2009    TDAP status: Up to date  Flu Vaccine status: Up to date  Pneumococcal vaccine status: Up to date  Covid-19 vaccine status: Completed vaccines  Qualifies for Shingles Vaccine? Yes   Zostavax completed Yes   Shingrix Completed?: Yes  Screening Tests Health Maintenance  Topic Date Due   COVID-19 Vaccine (4 - 2023-24 season) 09/16/2022 (Originally 04/29/2022)   COLONOSCOPY (Pts 45-13yr Insurance coverage will need to be confirmed)  03/07/2023   Medicare Annual Wellness (AWV)   09/01/2023   DTaP/Tdap/Td (3 - Td or Tdap) 10/05/2025   Pneumonia Vaccine 76 Years old  Completed   INFLUENZA VACCINE  Completed   DEXA SCAN  Completed   Hepatitis C Screening  Completed   Zoster Vaccines- Shingrix  Completed   HPV VACCINES  Aged Out    Health Maintenance  There are no preventive care reminders to display for this patient.   Colorectal cancer screening: Type of screening: Colonoscopy. Completed 03/06/13. Repeat every 10 years  Mammogram status: No longer required due to Age.  Bone Density status: Completed 08/16/21. Results reflect: Bone density results: OSTEOPOROSIS. Repeat every   years.  Lung Cancer Screening: (Low Dose CT Chest recommended if Age 76-80years, 30 pack-year currently smoking OR have quit w/in 15years.) does not qualify.     Additional Screening:  Hepatitis C Screening: does qualify; Completed 09/08/16  Vision Screening: Recommended annual ophthalmology exams for early detection of glaucoma and other disorders of the eye. Is the patient up to date with their annual eye exam?  Yes  Who is the provider or what is the name of the office in which the patient attends annual eye exams? Dr GKaty FitchIf pt is not established with a provider, would they like to be referred to a provider to establish care? No .   Dental Screening: Recommended annual dental exams for proper oral hygiene  Community Resource Referral / Chronic Care Management:  CRR required this visit?  No   CCM required this visit?  No      Plan:     I have personally reviewed and noted the following in the patient's chart:   Medical and social history Use of alcohol, tobacco or illicit drugs  Current medications and supplements including opioid prescriptions. Patient is currently taking opioid prescriptions. Information provided to patient regarding non-opioid alternatives. Patient advised to discuss non-opioid treatment plan with their provider. Functional ability and  status Nutritional status Physical activity Advanced directives List of other physicians Hospitalizations, surgeries, and ER visits in previous 12 months Vitals Screenings to include cognitive, depression, and falls Referrals and appointments  In addition, I have reviewed and discussed with patient certain preventive protocols, quality metrics, and best practice recommendations. A written personalized care plan for preventive services as well as general preventive health recommendations were provided to patient.     BCriselda Peaches LPN   07/02/9701  Nurse Notes: None   Medical screening examination/treatment/procedure(s) were performed by non-physician practitioner and as supervising physician I was immediately available for consultation/collaboration.  I agree with above. ALew Dawes MD

## 2022-09-01 DIAGNOSIS — M25511 Pain in right shoulder: Secondary | ICD-10-CM | POA: Diagnosis not present

## 2022-09-02 ENCOUNTER — Ambulatory Visit: Payer: Medicare Other | Attending: Internal Medicine | Admitting: Internal Medicine

## 2022-09-02 ENCOUNTER — Encounter: Payer: Self-pay | Admitting: Internal Medicine

## 2022-09-02 VITALS — BP 124/68 | HR 68 | Ht 59.0 in | Wt 127.8 lb

## 2022-09-02 DIAGNOSIS — I10 Essential (primary) hypertension: Secondary | ICD-10-CM

## 2022-09-02 NOTE — Progress Notes (Signed)
Cardiology Office Note   Date:  09/02/2022   ID:  Sharon Paul, DOB 23-Sep-1945, MRN [id number]  PCP:  Cassandria Anger, MD  Cardiologist:   Dorris Carnes, MD    Pt presents for eval/follow up of CV dz and HL    History of Present Illness: Sharon Paul is a 77 y.o. female with a history of  PAD, GERD, HL, GERD, sprue   She had mild coronary calcifications and calcifications of aorta on CT in 2007      She has had problems with statins due to achiness  The pt has elevated velocities in L carotid by USN due to kink in vessel (see below)    Since seen she says she has done well  Denies CP   Breathing is good    No palpitations       Current Meds  Medication Sig   acetaminophen (TYLENOL) 500 MG tablet Take 500 mg by mouth every 6 (six) hours as needed for moderate pain.   ALPRAZolam (XANAX) 0.25 MG tablet Take 1 tablet (0.25 mg total) by mouth 2 (two) times daily as needed for anxiety.   alum & mag hydroxide-simeth (MAALOX/MYLANTA) 200-200-20 MG/5ML suspension Take 15 mLs by mouth at bedtime as needed for indigestion or heartburn.   amLODipine (NORVASC) 10 MG tablet Take 1 tablet (10 mg total) by mouth daily.   aspirin 81 MG tablet Take 81 mg by mouth daily.   Calcium-Vitamin D-Vitamin K 650-12.5-40 MG-MCG-MCG CHEW Chew 1 tablet by mouth daily.   Cholecalciferol 2000 UNITS TABS Take 2,000 Units by mouth daily.   Coenzyme Q10 (CO Q-10 PO) Take 1 capsule by mouth daily.   Evolocumab (REPATHA SURECLICK) 329 MG/ML SOAJ INJECT 1 PEN INTO THE SKIN EVERY 14 (FOURTEEN) DAYS.   fexofenadine (ALLEGRA) 180 MG tablet Take 180 mg by mouth daily.   FLUoxetine (PROZAC) 10 MG capsule TAKE 1 CAPSULE BY MOUTH  DAILY   fluticasone (FLONASE) 50 MCG/ACT nasal spray USE 2 SPRAYS NASALLY DAILY (Patient taking differently: Place 2 sprays into both nostrils daily as needed for allergies.)   ipratropium (ATROVENT) 0.06 % nasal spray PLACE 2 SPRAYS INTO THE NOSE 3 (THREE) TIMES DAILY.    irbesartan (AVAPRO) 300 MG tablet Take 0.5 tablets (150 mg total) by mouth 2 (two) times daily.   labetalol (NORMODYNE) 300 MG tablet TAKE 1 TABLET BY MOUTH  TWICE DAILY   LORazepam (ATIVAN) 0.5 MG tablet TAKE 1 TABLET BY MOUTH TWICE A DAY   meloxicam (MOBIC) 15 MG tablet Take 1 tablet (15 mg total) by mouth daily.   Multiple Vitamins-Minerals (MULTIVITAMIN WOMEN 50+) TABS Take 1 tablet by mouth daily.   NONFORMULARY OR COMPOUNDED ITEM compounded vaginal estradiol cream 0.02% insert 1 gram vaginally twice a week at bedtime.  Disp:  60 grams.   ondansetron (ZOFRAN) 4 MG tablet Take 1 tablet (4 mg total) by mouth every 8 (eight) hours as needed for nausea or vomiting.   oxyCODONE-acetaminophen (PERCOCET) 5-325 MG tablet Take 1 tablet by mouth every 4 (four) hours as needed (max 6 q).   pantoprazole (PROTONIX) 40 MG tablet TAKE 1 TABLET BY MOUTH  DAILY ANNUAL APPOINTMENT  DUE IN FEB 2023 MUST SEE  PROVIDER FOR FUTURE REFILLS   rosuvastatin (CRESTOR) 10 MG tablet Take 0.5 tablets (5 mg total) by mouth every other day.   tiZANidine (ZANAFLEX) 4 MG tablet Take 1 tablet (4 mg total) by mouth every 8 (eight) hours as needed for muscle spasms.   traMADol-acetaminophen (  ULTRACET) 37.5-325 MG tablet TAKE 1 TABLET BY MOUTH EVERY 6 HOURS AS NEEDED   valACYclovir (VALTREX) 1000 MG tablet Take 2 tablets at the earliest onset of cold sore symptoms.  Repeat with 2 tablets 12 hours later     Allergies:   Augmentin [amoxicillin-pot clavulanate], Clarithromycin, Codeine, Esomeprazole magnesium, Hydrochlorothiazide, Iohexol, Losartan potassium, Olmesartan medoxomil, Ramipril, Risedronate sodium, and Kapidex [dexlansoprazole]   Past Medical History:  Diagnosis Date   Anxiety    Arthritis    Celiac sprue 2007   CVD (cardiovascular disease)    Foot fracture, left 2011   GERD (gastroesophageal reflux disease)    Hyperlipidemia    Hypertension    Osteopenia 04/2019   T score -1.9 FRAX 10% / 1.9%.  Stable from  prior DEXA   Pre-diabetes    PVD (peripheral vascular disease) (HCC)    mild carotid ? fibromuscular dysplasia   Shingles    Urinary incontinence     Past Surgical History:  Procedure Laterality Date   APPENDECTOMY     BREAST SURGERY     right breast biopsy   ESOPHAGOGASTRODUODENOSCOPY     REVERSE SHOULDER ARTHROPLASTY Right 07/15/2022   Procedure: REVERSE SHOULDER ARTHROPLASTY;  Surgeon: Justice Britain, MD;  Location: [facility];  Service: Orthopedics;  Laterality: Right;  173mn   ROTATOR CUFF REPAIR     RIGHT   ROTATOR CUFF REPAIR     Left   stress cardiolite  04-18-00   VAGINAL HYSTERECTOMY  1993   leiomyomata     Social History:  The patient  reports that she quit smoking about 52 years ago. Her smoking use included cigarettes. She has never used smokeless tobacco. She reports current alcohol use. She reports that she does not use drugs.   Family History:  The patient's family history includes Glaucoma in her brother and mother; Heart disease in her father and mother; Hypertension in her brother, father, mother, paternal grandmother, and sister; Lung cancer in her paternal grandmother; Stroke in her father.    ROS:  Please see the history of present illness. All other systems are reviewed and  Negative to the above problem except as noted.    PHYSICAL EXAM: VS:  BP 124/68   Pulse 68   Ht '4\' 11"'$  (1.499 m)   Wt 127 lb 12.8 oz (58 kg)   SpO2 99%   BMI 25.81 kg/m   GEN: Well nourished, well developed, in no acute distress  HEENT: normal  Neck: no JVD Cardiac: RRR; no murmur  No LE edema  Respiratory:  clear to auscultation bilaterally GI: soft, nontender, nondistended, + BS  No hepatomegaly  MS: no deformity Moving all extremities   Skin: warm and dry, no rash Neuro:  Strength and sensation are intact Psych: euthymic mood, full affect   EKG:  EKG is not ordered    Carotid USN 06/06/22  Right Carotid: There is no evidence of stenosis in the right ICA. The  extracranial vessels were near-normal with only minimal wall thickening or plaque. Left Carotid: Velocities in the left ICA are consistent with a 40-59% stenosis. Non-hemodynamically significant plaque <50% noted in the CCA. Stable elevated velocities with no obvious plaque seen in the ICA suggesting 40-59% stenosis; elevated velocities in the proximal/mid segment due to kink in the vessel, and a steep posterior dive. See images 46-49. Vertebrals: Bilateral vertebral arteries demonstrate antegrade flow. Subclavians: Bilateral subclavian artery flow was disturbed.  Heterogeneous plaque is noted    Lipid Panel    Component  Value Date/Time   CHOL 118 02/21/2022 0947   CHOL 138 03/15/2021 1001   TRIG 134.0 02/21/2022 0947   TRIG 131 08/02/2006 0853   HDL 62.70 02/21/2022 0947   HDL 74 03/15/2021 1001   CHOLHDL 2 02/21/2022 0947   VLDL 26.8 02/21/2022 0947   LDLCALC 28 02/21/2022 0947   LDLCALC 47 03/15/2021 1001   LDLCALC 111 (H) 04/16/2020 0927   LDLDIRECT 138.2 10/22/2010 0944      Wt Readings from Last 3 Encounters:  09/02/22 127 lb 12.8 oz (58 kg)  08/31/22 125 lb (56.7 kg)  07/15/22 125 lb (56.7 kg)      ASSESSMENT AND PLAN:  1  CV dz  USN of carotid signfi for higher velocities in L ICA due to kink   No significatn plaquing   Follow periodically   2 Atherosclerosis   Pt asymptomatic   Calcifications on CT scan   Rx lipids    2  HL   Due to problems with statins pt placed on Repatha  and low dose Crestor (5 every other day.   Last  LDL 28   Unfortunately can't afford due to donut hole for meds   Will check with pharmacy     4  HTN  BP is good    Continue amlodipine, avapro, labetalol     Plan for f/u in cliic in 1 year   will work with pharmacy re Repatha   Current medicines are reviewed at length with the patient today.  The patient does not have concerns regarding medicines.  Signed, Dorris Carnes, MD  09/02/2022 3:01 PM    [facility]  [facility], [place],   [address] Phone: [phone]; Fax: [phone]

## 2022-09-02 NOTE — Patient Instructions (Signed)
Medication Instructions:   *If you need a refill on your cardiac medications before your next appointment, please call your pharmacy*   Lab Work:  If you have labs (blood work) drawn today and your tests are completely normal, you will receive your results only by: [place] (if you have MyChart) OR A paper copy in the mail If you have any lab test that is abnormal or we need to change your treatment, we will call you to review the results.   Testing/Procedures:    Follow-Up: At [facility], you and your health needs are our priority.  As part of our continuing mission to provide you with exceptional heart care, we have created designated Provider Care Teams.  These Care Teams include your primary Cardiologist (physician) and Advanced Practice Providers (APPs -  Physician Assistants and Nurse Practitioners) who all work together to provide you with the care you need, when you need it.  We recommend signing up for the patient portal called "MyChart".  Sign up information is provided on this After Visit Summary.  MyChart is used to connect with patients for Virtual Visits (Telemedicine).  Patients are able to view lab/test results, encounter notes, upcoming appointments, etc.  Non-urgent messages can be sent to your provider as well.   To learn more about what you can do with MyChart, go to [email or web address].    Your next appointment:   1 year(s)  The format for your next appointment:   In Person  Provider:   Dorris Carnes, MD     Other Instructions   Important Information About Sugar

## 2022-09-05 DIAGNOSIS — Z96611 Presence of right artificial shoulder joint: Secondary | ICD-10-CM | POA: Diagnosis not present

## 2022-09-13 DIAGNOSIS — M25511 Pain in right shoulder: Secondary | ICD-10-CM | POA: Diagnosis not present

## 2022-09-16 DIAGNOSIS — M25511 Pain in right shoulder: Secondary | ICD-10-CM | POA: Diagnosis not present

## 2022-09-30 DIAGNOSIS — M25511 Pain in right shoulder: Secondary | ICD-10-CM | POA: Diagnosis not present

## 2022-10-04 DIAGNOSIS — M25511 Pain in right shoulder: Secondary | ICD-10-CM | POA: Diagnosis not present

## 2022-10-05 DIAGNOSIS — Z96611 Presence of right artificial shoulder joint: Secondary | ICD-10-CM | POA: Diagnosis not present

## 2022-10-05 DIAGNOSIS — Z471 Aftercare following joint replacement surgery: Secondary | ICD-10-CM | POA: Diagnosis not present

## 2022-10-06 ENCOUNTER — Telehealth: Payer: Self-pay

## 2022-10-06 DIAGNOSIS — N952 Postmenopausal atrophic vaginitis: Secondary | ICD-10-CM

## 2022-10-06 DIAGNOSIS — Z1231 Encounter for screening mammogram for malignant neoplasm of breast: Secondary | ICD-10-CM | POA: Diagnosis not present

## 2022-10-06 LAB — HM MAMMOGRAPHY

## 2022-10-06 NOTE — Telephone Encounter (Signed)
[facility] faxed RX change request for Compounded Estradiol 0.02% cream.  Pharmacy no longer compounds this medication as generic Estrace Cream is available for $35 tube. Advise if ok to change rx to generic estrace cream.    LM for patient with above information and asked her to let us know if she desires to continue using [facility] for this prescription or [facility] [address]

## 2022-10-07 NOTE — Telephone Encounter (Signed)
Pt called to report that she would like the [facility] the [facility]   Last AEX 07/21/2021--recall placed for 06/2023 for B&P due to pt being low risk medicare. However, advised pt to schedule 33yrmed check OV since being prescribed a maintenance medication. Pt voiced understanding. Msg sent to scheduling.   Last mammo-07/06/2021-neg birads 1, pt reports just having mammo done while on phone. However, no report received yet.

## 2022-10-10 MED ORDER — ESTRADIOL 0.1 MG/GM VA CREA: TOPICAL_CREAM | VAGINAL | 0 refills | Status: DC

## 2022-10-10 NOTE — Telephone Encounter (Signed)
Sharon Paul, CMA Left message for patient to call and schedule appointment.

## 2022-10-11 DIAGNOSIS — M25511 Pain in right shoulder: Secondary | ICD-10-CM | POA: Diagnosis not present

## 2022-10-11 NOTE — Telephone Encounter (Signed)
Patient called because she is waiting on Rx for generic Estrace Vag Cream to be sent to pharmacy. She had been getting estradiol cream at [facility] but they are no longer making it.  I advised her she needs to schedule visit for medication maintenance as she was due to come back in November 2023. She agrees and will watch for someone to be calling.

## 2022-10-13 ENCOUNTER — Encounter: Payer: Self-pay | Admitting: *Deleted

## 2022-10-14 ENCOUNTER — Other Ambulatory Visit: Payer: Self-pay | Admitting: *Deleted

## 2022-10-14 DIAGNOSIS — M25511 Pain in right shoulder: Secondary | ICD-10-CM | POA: Diagnosis not present

## 2022-10-14 DIAGNOSIS — N952 Postmenopausal atrophic vaginitis: Secondary | ICD-10-CM

## 2022-10-14 MED ORDER — ESTRADIOL 0.1 MG/GM VA CREA: TOPICAL_CREAM | VAGINAL | 0 refills | Status: DC

## 2022-10-14 NOTE — Telephone Encounter (Signed)
Rx request estradiol vaginal cream Last annual 07/21/2021 Scheduled med check 11/09/2022 Last mammogram 10/06/22 Routing to Provider for approval/denial

## 2022-10-17 NOTE — Telephone Encounter (Signed)
Thanks. Looks like Jami sent prescription in on 10/14/22.

## 2022-10-17 NOTE — Telephone Encounter (Signed)
1 year med check appt scheduled 11/09/22.  See original message on this post "[facility] faxed RX change request for Compounded Estradiol 0.02% cream.  Pharmacy no longer compounds this medication as generic Estrace Cream is available for $35 tube. Advise if ok to change rx to generic estrace cream.  "

## 2022-10-22 ENCOUNTER — Other Ambulatory Visit: Payer: Self-pay | Admitting: Internal Medicine

## 2022-10-31 ENCOUNTER — Other Ambulatory Visit: Payer: Self-pay | Admitting: Internal Medicine

## 2022-11-01 DIAGNOSIS — M25511 Pain in right shoulder: Secondary | ICD-10-CM | POA: Diagnosis not present

## 2022-11-03 ENCOUNTER — Other Ambulatory Visit: Payer: Self-pay | Admitting: *Deleted

## 2022-11-03 MED ORDER — ROSUVASTATIN CALCIUM 5 MG PO TABS: 5.0000 mg | ORAL_TABLET | Freq: Every day | ORAL | 3 refills | Status: DC

## 2022-11-08 DIAGNOSIS — M25511 Pain in right shoulder: Secondary | ICD-10-CM | POA: Diagnosis not present

## 2022-11-09 ENCOUNTER — Encounter: Payer: Medicare Other | Admitting: Radiology

## 2022-11-09 DIAGNOSIS — N952 Postmenopausal atrophic vaginitis: Secondary | ICD-10-CM

## 2022-11-09 MED ORDER — ESTRADIOL 0.1 MG/GM VA CREA: TOPICAL_CREAM | VAGINAL | 5 refills | Status: DC

## 2022-11-09 NOTE — Progress Notes (Signed)
Erroneous encounter

## 2022-11-11 DIAGNOSIS — M25511 Pain in right shoulder: Secondary | ICD-10-CM | POA: Diagnosis not present

## 2022-11-15 DIAGNOSIS — M25511 Pain in right shoulder: Secondary | ICD-10-CM | POA: Diagnosis not present

## 2022-11-17 ENCOUNTER — Other Ambulatory Visit: Payer: Self-pay | Admitting: Internal Medicine

## 2022-11-17 DIAGNOSIS — M25511 Pain in right shoulder: Secondary | ICD-10-CM | POA: Diagnosis not present

## 2022-11-24 DIAGNOSIS — M25511 Pain in right shoulder: Secondary | ICD-10-CM | POA: Diagnosis not present

## 2022-11-28 ENCOUNTER — Ambulatory Visit (INDEPENDENT_AMBULATORY_CARE_PROVIDER_SITE_OTHER): Payer: Medicare Other | Admitting: Internal Medicine

## 2022-11-28 ENCOUNTER — Encounter: Payer: Self-pay | Admitting: Internal Medicine

## 2022-11-28 VITALS — BP 118/60 | HR 58 | Temp 98.7°F | Ht 59.0 in | Wt 124.0 lb

## 2022-11-28 DIAGNOSIS — G8929 Other chronic pain: Secondary | ICD-10-CM | POA: Diagnosis not present

## 2022-11-28 DIAGNOSIS — R197 Diarrhea, unspecified: Secondary | ICD-10-CM | POA: Diagnosis not present

## 2022-11-28 DIAGNOSIS — R0683 Snoring: Secondary | ICD-10-CM | POA: Insufficient documentation

## 2022-11-28 DIAGNOSIS — M25511 Pain in right shoulder: Secondary | ICD-10-CM

## 2022-11-28 MED ORDER — METRONIDAZOLE 500 MG PO TABS
500.0000 mg | ORAL_TABLET | Freq: Two times a day (BID) | ORAL | 0 refills | Status: DC
Start: 2022-11-28 — End: 2023-05-09

## 2022-11-28 MED ORDER — CIPROFLOXACIN HCL 250 MG PO TABS: 250.0000 mg | ORAL_TABLET | Freq: Two times a day (BID) | ORAL | 0 refills | Status: DC

## 2022-11-28 NOTE — Assessment & Plan Note (Addendum)
?  [facility] Pulm ref offered Pt declined at the moment Contour pillow

## 2022-11-28 NOTE — Assessment & Plan Note (Addendum)
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 Use heat before exercising Blue-Emu cream was recommended to use 2-3 times a day

## 2022-11-28 NOTE — Patient Instructions (Addendum)
Use heat before exercising Blue-Emu cream - use 2-3 times a day

## 2022-11-28 NOTE — Assessment & Plan Note (Signed)
Relapsed ?diverticulitis GI consultation with Dr. Silverio Decamp Cipro 250 mg bid x 10 d Flagyl 500 mg bid

## 2022-11-28 NOTE — Progress Notes (Signed)
Subjective:  Patient ID: Sharon Paul, female    DOB: Aug 31, 1945  Age: 77 y.o. MRN: [id number]  CC: No chief complaint on file.   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 Finished PT C/o snoring - worse   Outpatient Medications Prior to Visit  Medication Sig Dispense Refill   acetaminophen (TYLENOL) 500 MG tablet Take 500 mg by mouth every 6 (six) hours as needed for moderate pain.     ALPRAZolam (XANAX) 0.25 MG tablet Take 1 tablet (0.25 mg total) by mouth 2 (two) times daily as needed for anxiety. 30 tablet 0   alum & mag hydroxide-simeth (MAALOX/MYLANTA) 200-200-20 MG/5ML suspension Take 15 mLs by mouth at bedtime as needed for indigestion or heartburn.     amLODipine (NORVASC) 10 MG tablet Take 1 tablet (10 mg total) by mouth daily. 90 tablet 3   aspirin 81 MG tablet Take 81 mg by mouth daily.     Calcium-Vitamin D-Vitamin K 650-12.5-40 MG-MCG-MCG CHEW Chew 1 tablet by mouth daily.     Cholecalciferol 2000 UNITS TABS Take 2,000 Units by mouth daily.     Coenzyme Q10 (CO Q-10 PO) Take 1 capsule by mouth daily.     estradiol (ESTRACE) 0.1 MG/GM vaginal cream Insert 1gm vaginally twice weekly. 42.5 g 5   Evolocumab (REPATHA SURECLICK) [id number] MG/ML SOAJ INJECT 1 PEN INTO THE SKIN EVERY 14 (FOURTEEN) DAYS. 2 mL 11   fexofenadine (ALLEGRA) 180 MG tablet Take 180 mg by mouth daily.     FLUoxetine (PROZAC) 10 MG capsule TAKE 1 CAPSULE BY MOUTH  DAILY 90 capsule 3   fluticasone (FLONASE) 50 MCG/ACT nasal spray USE 2 SPRAYS NASALLY DAILY (Patient taking differently: Place 2 sprays into both nostrils daily as needed for allergies.) 48 g 3   ipratropium (ATROVENT) 0.06 % nasal spray PLACE 2 SPRAYS INTO THE NOSE 3 (THREE) TIMES DAILY. 30 mL 2   irbesartan (AVAPRO) 300 MG tablet TAKE 0.5 TABLETS (150 MG TOTAL) BY MOUTH 2 (TWO) TIMES DAILY. 90 tablet 1   labetalol (NORMODYNE) 300 MG tablet TAKE 1 TABLET BY MOUTH  TWICE DAILY 180 tablet 3    LORazepam (ATIVAN) 0.5 MG tablet TAKE 1 TABLET BY MOUTH TWICE A DAY 60 tablet 3   meloxicam (MOBIC) 15 MG tablet Take 1 tablet (15 mg total) by mouth daily. 30 tablet 2   Multiple Vitamins-Minerals (MULTIVITAMIN WOMEN 50+) TABS Take 1 tablet by mouth daily.     ondansetron (ZOFRAN) 4 MG tablet Take 1 tablet (4 mg total) by mouth every 8 (eight) hours as needed for nausea or vomiting. 10 tablet 0   oxyCODONE-acetaminophen (PERCOCET) 5-325 MG tablet Take 1 tablet by mouth every 4 (four) hours as needed (max 6 q). 20 tablet 0   pantoprazole (PROTONIX) 40 MG tablet TAKE 1 TABLET BY MOUTH  DAILY ANNUAL APPOINTMENT  DUE IN FEB 2023 MUST SEE  PROVIDER FOR FUTURE REFILLS 90 tablet 3   rosuvastatin (CRESTOR) 5 MG tablet Take 1 tablet (5 mg total) by mouth daily. 45 tablet 3   traMADol-acetaminophen (ULTRACET) 37.5-325 MG tablet TAKE 1 TABLET BY MOUTH EVERY 6 HOURS AS NEEDED 100 tablet 1   valACYclovir (VALTREX) 1000 MG tablet Take 2 tablets at the earliest onset of cold sore symptoms.  Repeat with 2 tablets 12 hours later 12 tablet 1   tiZANidine (ZANAFLEX) 4 MG tablet Take 1 tablet (4 mg total) by mouth every 8 (eight) hours as needed for  muscle spasms. 30 tablet 1   No facility-administered medications prior to visit.    ROS: Review of Systems  Constitutional:  Negative for activity change, appetite change, chills, fatigue and unexpected weight change.  HENT:  Negative for congestion, mouth sores and sinus pressure.   Eyes:  Negative for visual disturbance.  Respiratory:  Negative for cough and chest tightness.   Gastrointestinal:  Negative for abdominal pain and nausea.  Genitourinary:  Negative for difficulty urinating, frequency and vaginal pain.  Musculoskeletal:  Positive for arthralgias. Negative for back pain and gait problem.  Skin:  Negative for pallor and rash.  Neurological:  Negative for dizziness, tremors, weakness, numbness and headaches.  Psychiatric/Behavioral:  Negative for  confusion and sleep disturbance.     Objective:  BP 118/60 (BP Location: Left Arm, Patient Position: Sitting, Cuff Size: Normal)   Pulse (!) 58   Temp 98.7 F (37.1 C) (Oral)   Ht 4\' 11"  (1.499 m)   Wt 124 lb (56.2 kg)   SpO2 95%   BMI 25.04 kg/m   BP Readings from Last 3 Encounters:  11/28/22 118/60  09/02/22 124/68  07/15/22 (!) 118/49    Wt Readings from Last 3 Encounters:  11/28/22 124 lb (56.2 kg)  09/02/22 127 lb 12.8 oz (58 kg)  08/31/22 125 lb (56.7 kg)    Physical Exam Constitutional:      General: She is not in acute distress.    Appearance: She is well-developed.  HENT:     Head: Normocephalic.     Right Ear: External ear normal.     Left Ear: External ear normal.     Nose: Nose normal.  Eyes:     General:        Right eye: No discharge.        Left eye: No discharge.     Conjunctiva/sclera: Conjunctivae normal.     Pupils: Pupils are equal, round, and reactive to light.  Neck:     Thyroid: No thyromegaly.     Vascular: No JVD.     Trachea: No tracheal deviation.  Cardiovascular:     Rate and Rhythm: Normal rate and regular rhythm.     Heart sounds: Normal heart sounds.  Pulmonary:     Effort: No respiratory distress.     Breath sounds: No stridor. No wheezing.  Abdominal:     General: Bowel sounds are normal. There is no distension.     Palpations: Abdomen is soft. There is no mass.     Tenderness: There is no abdominal tenderness. There is no guarding or rebound.  Musculoskeletal:        General: Tenderness present.     Cervical back: Normal range of motion and neck supple. No rigidity.  Lymphadenopathy:     Cervical: No cervical adenopathy.  Skin:    Findings: No erythema or rash.  Neurological:     Cranial Nerves: No cranial nerve deficit.     Motor: No abnormal muscle tone.     Coordination: Coordination normal.     Deep Tendon Reflexes: Reflexes normal.  Psychiatric:        Behavior: Behavior normal.        Thought Content: Thought  content normal.        Judgment: Judgment normal.     R shoulder w/mild pain w/ROM Lab Results  Component Value Date   WBC 4.4 07/05/2022   HGB 11.4 (L) 07/05/2022   HCT 35.5 (L) 07/05/2022   PLT 248 07/05/2022  GLUCOSE 114 (H) 07/05/2022   CHOL 118 02/21/2022   TRIG 134.0 02/21/2022   HDL 62.70 02/21/2022   LDLDIRECT 138.2 10/22/2010   LDLCALC 28 02/21/2022   ALT 19 02/21/2022   AST 19 02/21/2022   NA 140 07/05/2022   K 4.2 07/05/2022   CL 105 07/05/2022   CREATININE 0.80 07/05/2022   BUN 13 07/05/2022   CO2 24 07/05/2022   TSH 1.73 10/28/2021   HGBA1C 5.7 (H) 07/05/2022    No results found.  Assessment & Plan:   Problem List Items Addressed This Visit       Other   Diarrhea    Relapsed ?diverticulitis GI consultation with Dr. 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 Use heat before exercising Blue-Emu cream was recommended to use 2-3 times a day         Snoring    ?[facility] Pulm ref offered Pt declined at the moment Contour pillow         Meds ordered this encounter  Medications   ciprofloxacin (CIPRO) 250 MG tablet    Sig: Take 1 tablet (250 mg total) by mouth 2 (two) times daily.    Dispense:  20 tablet    Refill:  0   metroNIDAZOLE (FLAGYL) 500 MG tablet    Sig: Take 1 tablet (500 mg total) by mouth 2 (two) times daily.    Dispense:  20 tablet    Refill:  0      Follow-up: Return in about 6 weeks (around 01/09/2023) for a follow-up visit.  Sonda Primes, MD

## 2022-12-06 ENCOUNTER — Telehealth: Payer: Self-pay | Admitting: Internal Medicine

## 2022-12-06 NOTE — Telephone Encounter (Signed)
Pt c/o medication issue:  1. Name of Medication: Evolocumab (REPATHA SURECLICK) 140 MG/ML SOAJ  2. How are you currently taking this medication (dosage and times per day)?    3. Are you having a reaction (difficulty breathing--STAT)?  no  4. What is your medication issue? Patient was calling in to say that her pen was damage . And that they are suppose to send something to dr Tenny Craw stating its okay to replace it. Please advise

## 2022-12-07 MED ORDER — REPATHA SURECLICK 140 MG/ML ~~LOC~~ SOAJ: 1.0000 | SUBCUTANEOUS | 0 refills | Status: DC

## 2022-12-07 NOTE — Telephone Encounter (Signed)
Will send in replacement pen rx to [facility] pharmacy. Left message for pt making her aware.

## 2022-12-11 ENCOUNTER — Other Ambulatory Visit: Payer: Self-pay | Admitting: Internal Medicine

## 2022-12-18 ENCOUNTER — Other Ambulatory Visit: Payer: Self-pay | Admitting: Internal Medicine

## 2023-01-02 DIAGNOSIS — H401111 Primary open-angle glaucoma, right eye, mild stage: Secondary | ICD-10-CM | POA: Diagnosis not present

## 2023-01-02 DIAGNOSIS — H35411 Lattice degeneration of retina, right eye: Secondary | ICD-10-CM | POA: Diagnosis not present

## 2023-01-02 DIAGNOSIS — H02831 Dermatochalasis of right upper eyelid: Secondary | ICD-10-CM | POA: Diagnosis not present

## 2023-01-02 DIAGNOSIS — H25813 Combined forms of age-related cataract, bilateral: Secondary | ICD-10-CM | POA: Diagnosis not present

## 2023-01-02 DIAGNOSIS — H43812 Vitreous degeneration, left eye: Secondary | ICD-10-CM | POA: Diagnosis not present

## 2023-01-02 DIAGNOSIS — H02834 Dermatochalasis of left upper eyelid: Secondary | ICD-10-CM | POA: Diagnosis not present

## 2023-01-02 DIAGNOSIS — H40022 Open angle with borderline findings, high risk, left eye: Secondary | ICD-10-CM | POA: Diagnosis not present

## 2023-01-02 DIAGNOSIS — H04123 Dry eye syndrome of bilateral lacrimal glands: Secondary | ICD-10-CM | POA: Diagnosis not present

## 2023-01-02 DIAGNOSIS — D3131 Benign neoplasm of right choroid: Secondary | ICD-10-CM | POA: Diagnosis not present

## 2023-01-12 ENCOUNTER — Ambulatory Visit: Payer: Medicare Other | Admitting: Internal Medicine

## 2023-01-13 ENCOUNTER — Ambulatory Visit: Payer: Medicare Other | Admitting: Internal Medicine

## 2023-02-02 ENCOUNTER — Encounter: Payer: Self-pay | Admitting: Internal Medicine

## 2023-02-02 ENCOUNTER — Ambulatory Visit (INDEPENDENT_AMBULATORY_CARE_PROVIDER_SITE_OTHER): Payer: Medicare Other | Admitting: Internal Medicine

## 2023-02-02 VITALS — BP 120/78 | HR 55 | Temp 97.9°F | Ht 59.0 in | Wt 125.0 lb

## 2023-02-02 DIAGNOSIS — E785 Hyperlipidemia, unspecified: Secondary | ICD-10-CM | POA: Diagnosis not present

## 2023-02-02 DIAGNOSIS — F411 Generalized anxiety disorder: Secondary | ICD-10-CM | POA: Diagnosis not present

## 2023-02-02 DIAGNOSIS — R739 Hyperglycemia, unspecified: Secondary | ICD-10-CM | POA: Diagnosis not present

## 2023-02-02 DIAGNOSIS — M791 Myalgia, unspecified site: Secondary | ICD-10-CM | POA: Insufficient documentation

## 2023-02-02 MED ORDER — FLUTICASONE PROPIONATE 50 MCG/ACT NA SUSP: 2.0000 | Freq: Every day | NASAL | 5 refills | Status: AC | PRN

## 2023-02-02 NOTE — Assessment & Plan Note (Signed)
Continue on Prozac, Use  Lorazepam prn  Potential benefits of a long term benzodiazepines  use as well as potential risks  and complications were explained to the patient and were aknowledged. 

## 2023-02-02 NOTE — Progress Notes (Signed)
Subjective:  Patient ID: Sharon Paul, female    DOB: [birth year]  Age: 77 y.o. MRN: [id number]  CC: Follow-up (6 week f/u)   HPI Anely Sanchez-Grateron presents for diarrhea after eating out x1-2 d F/u allergies, anxiety  Outpatient Medications Prior to Visit  Medication Sig Dispense Refill   acetaminophen (TYLENOL) 500 MG tablet Take 500 mg by mouth every 6 (six) hours as needed for moderate pain.     ALPRAZolam (XANAX) 0.25 MG tablet Take 1 tablet (0.25 mg total) by mouth 2 (two) times daily as needed for anxiety. 30 tablet 0   alum & mag hydroxide-simeth (MAALOX/MYLANTA) 200-200-20 MG/5ML suspension Take 15 mLs by mouth at bedtime as needed for indigestion or heartburn.     amLODipine (NORVASC) 10 MG tablet Take 1 tablet (10 mg total) by mouth daily. 90 tablet 3   aspirin 81 MG tablet Take 81 mg by mouth daily.     Calcium-Vitamin D-Vitamin K 650-12.5-40 MG-MCG-MCG CHEW Chew 1 tablet by mouth daily.     Cholecalciferol 2000 UNITS TABS Take 2,000 Units by mouth daily.     Coenzyme Q10 (CO Q-10 PO) Take 1 capsule by mouth daily.     estradiol (ESTRACE) 0.1 MG/GM vaginal cream Insert 1gm vaginally twice weekly. 42.5 g 5   Evolocumab (REPATHA SURECLICK) 140 MG/ML SOAJ INJECT 1 PEN INTO THE SKIN EVERY 14 (FOURTEEN) DAYS. 2 mL 11   fexofenadine (ALLEGRA) 180 MG tablet Take 180 mg by mouth daily.     FLUoxetine (PROZAC) 10 MG capsule TAKE 1 CAPSULE BY MOUTH DAILY 90 capsule 2   ipratropium (ATROVENT) 0.06 % nasal spray PLACE 2 SPRAYS INTO THE NOSE 3 (THREE) TIMES DAILY. 30 mL 2   irbesartan (AVAPRO) 300 MG tablet TAKE 0.5 TABLETS (150 MG TOTAL) BY MOUTH 2 (TWO) TIMES DAILY. 90 tablet 1   labetalol (NORMODYNE) 300 MG tablet TAKE 1 TABLET BY MOUTH TWICE  DAILY 200 tablet 1   LORazepam (ATIVAN) 0.5 MG tablet TAKE 1 TABLET BY MOUTH TWICE A DAY 60 tablet 3   meloxicam (MOBIC) 15 MG tablet Take 1 tablet (15 mg total) by mouth daily. 30 tablet 2   metroNIDAZOLE (FLAGYL) 500 MG tablet  Take 1 tablet (500 mg total) by mouth 2 (two) times daily. 20 tablet 0   Multiple Vitamins-Minerals (MULTIVITAMIN WOMEN 50+) TABS Take 1 tablet by mouth daily.     ondansetron (ZOFRAN) 4 MG tablet Take 1 tablet (4 mg total) by mouth every 8 (eight) hours as needed for nausea or vomiting. 10 tablet 0   oxyCODONE-acetaminophen (PERCOCET) 5-325 MG tablet Take 1 tablet by mouth every 4 (four) hours as needed (max 6 q). 20 tablet 0   pantoprazole (PROTONIX) 40 MG tablet TAKE 1 TABLET BY MOUTH  DAILY ANNUAL APPOINTMENT  DUE IN FEB 2023 MUST SEE  PROVIDER FOR FUTURE REFILLS 90 tablet 3   rosuvastatin (CRESTOR) 5 MG tablet Take 1 tablet (5 mg total) by mouth daily. 45 tablet 3   traMADol-acetaminophen (ULTRACET) 37.5-325 MG tablet TAKE 1 TABLET BY MOUTH EVERY 6 HOURS AS NEEDED 100 tablet 1   valACYclovir (VALTREX) 1000 MG tablet Take 2 tablets at the earliest onset of cold sore symptoms.  Repeat with 2 tablets 12 hours later 12 tablet 1   fluticasone (FLONASE) 50 MCG/ACT nasal spray USE 2 SPRAYS NASALLY DAILY (Patient taking differently: Place 2 sprays into both nostrils daily as needed for allergies.) 48 g 3   ciprofloxacin (CIPRO) 250 MG tablet Take 1 tablet (  250 mg total) by mouth 2 (two) times daily. 20 tablet 0   No facility-administered medications prior to visit.    ROS: Review of Systems  Constitutional:  Negative for activity change, appetite change, chills, fatigue and unexpected weight change.  HENT:  Negative for congestion, mouth sores and sinus pressure.   Eyes:  Negative for visual disturbance.  Respiratory:  Negative for cough and chest tightness.   Gastrointestinal:  Negative for abdominal pain and nausea.  Genitourinary:  Negative for difficulty urinating, frequency and vaginal pain.  Musculoskeletal:  Negative for back pain and gait problem.  Skin:  Negative for pallor and rash.  Neurological:  Negative for dizziness, tremors, weakness, numbness and headaches.   Psychiatric/Behavioral:  Negative for confusion and sleep disturbance.     Objective:  BP 120/78 (BP Location: Left Arm, Patient Position: Sitting, Cuff Size: Large)   Pulse (!) 55   Temp 97.9 F (36.6 C) (Oral)   Ht 4\' 11"  (1.499 m)   Wt 125 lb (56.7 kg)   SpO2 98%   BMI 25.25 kg/m   BP Readings from Last 3 Encounters:  02/02/23 120/78  11/28/22 118/60  09/02/22 124/68    Wt Readings from Last 3 Encounters:  02/02/23 125 lb (56.7 kg)  11/28/22 124 lb (56.2 kg)  09/02/22 127 lb 12.8 oz (58 kg)    Physical Exam Constitutional:      General: She is not in acute distress.    Appearance: She is well-developed.  HENT:     Head: Normocephalic.     Right Ear: External ear normal.     Left Ear: External ear normal.     Nose: Nose normal.  Eyes:     General:        Right eye: No discharge.        Left eye: No discharge.     Conjunctiva/sclera: Conjunctivae normal.     Pupils: Pupils are equal, round, and reactive to light.  Neck:     Thyroid: No thyromegaly.     Vascular: No JVD.     Trachea: No tracheal deviation.  Cardiovascular:     Rate and Rhythm: Normal rate and regular rhythm.     Heart sounds: Normal heart sounds.     No gallop.  Pulmonary:     Effort: No respiratory distress.     Breath sounds: No stridor. No wheezing.  Abdominal:     General: Bowel sounds are normal. There is no distension.     Palpations: Abdomen is soft. There is no mass.     Tenderness: There is no abdominal tenderness. There is no guarding or rebound.  Musculoskeletal:        General: No tenderness.     Cervical back: Normal range of motion and neck supple. No rigidity.  Lymphadenopathy:     Cervical: No cervical adenopathy.  Skin:    Findings: No erythema or rash.  Neurological:     Cranial Nerves: No cranial nerve deficit.     Motor: No abnormal muscle tone.     Coordination: Coordination normal.     Deep Tendon Reflexes: Reflexes normal.  Psychiatric:        Behavior:  Behavior normal.        Thought Content: Thought content normal.        Judgment: Judgment normal.    Lab Results  Component Value Date   WBC 4.4 07/05/2022   HGB 11.4 (L) 07/05/2022   HCT 35.5 (L) 07/05/2022  PLT 248 07/05/2022   GLUCOSE 114 (H) 07/05/2022   CHOL 118 02/21/2022   TRIG 134.0 02/21/2022   HDL 62.70 02/21/2022   LDLDIRECT 138.2 10/22/2010   LDLCALC 28 02/21/2022   ALT 19 02/21/2022   AST 19 02/21/2022   NA 140 07/05/2022   K 4.2 07/05/2022   CL 105 07/05/2022   CREATININE 0.80 07/05/2022   BUN 13 07/05/2022   CO2 24 07/05/2022   TSH 1.73 10/28/2021   HGBA1C 5.7 (H) 07/05/2022    No results found.  Assessment & Plan:   Problem List Items Addressed This Visit     Dyslipidemia   Relevant Orders   Lipid panel   Generalized anxiety disorder - Primary     Continue on Prozac, Use  Lorazepam prn  Potential benefits of a long term benzodiazepines  use as well as potential risks  and complications were explained to the patient and were aknowledged.      Relevant Orders   Hemoglobin A1c   Comprehensive metabolic panel   TSH   TSH   Urinalysis   CBC with Differential/Platelet   Lipid panel   Comprehensive metabolic panel   Hemoglobin A1c   Hyperglycemia    Monitor labs      Relevant Orders   Hemoglobin A1c   Comprehensive metabolic panel   TSH   Hemoglobin A1c      Meds ordered this encounter  Medications   fluticasone (FLONASE) 50 MCG/ACT nasal spray    Sig: Place 2 sprays into both nostrils daily as needed for allergies.    Dispense:  16 g    Refill:  5      Follow-up: Return in about 3 months (around 05/05/2023) for a follow-up visit.  Sonda Primes, MD

## 2023-02-02 NOTE — Assessment & Plan Note (Signed)
On Crestor - hold it x 2 wks, then re-start 2/week

## 2023-02-02 NOTE — Assessment & Plan Note (Signed)
Monitor labs 

## 2023-02-08 ENCOUNTER — Encounter: Payer: Self-pay | Admitting: Internal Medicine

## 2023-02-14 ENCOUNTER — Other Ambulatory Visit (INDEPENDENT_AMBULATORY_CARE_PROVIDER_SITE_OTHER): Payer: Medicare Other

## 2023-02-14 DIAGNOSIS — R739 Hyperglycemia, unspecified: Secondary | ICD-10-CM | POA: Diagnosis not present

## 2023-02-14 DIAGNOSIS — F411 Generalized anxiety disorder: Secondary | ICD-10-CM | POA: Diagnosis not present

## 2023-02-14 DIAGNOSIS — E785 Hyperlipidemia, unspecified: Secondary | ICD-10-CM | POA: Diagnosis not present

## 2023-02-14 LAB — CBC WITH DIFFERENTIAL/PLATELET
Basophils Absolute: 0 10*3/uL (ref 0.0–0.1)
Basophils Relative: 1 % (ref 0.0–3.0)
Eosinophils Absolute: 0.1 10*3/uL (ref 0.0–0.7)
Eosinophils Relative: 3.4 % (ref 0.0–5.0)
HCT: 36.1 % (ref 36.0–46.0)
Hemoglobin: 12 g/dL (ref 12.0–15.0)
Lymphocytes Relative: 42.6 % (ref 12.0–46.0)
Lymphs Abs: 1.4 10*3/uL (ref 0.7–4.0)
MCHC: 33.2 g/dL (ref 30.0–36.0)
MCV: 91.9 fl (ref 78.0–100.0)
Monocytes Absolute: 0.3 10*3/uL (ref 0.1–1.0)
Monocytes Relative: 10 % (ref 3.0–12.0)
Neutro Abs: 1.4 10*3/uL (ref 1.4–7.7)
Neutrophils Relative %: 43 % (ref 43.0–77.0)
Platelets: 293 10*3/uL (ref 150.0–400.0)
RBC: 3.93 Mil/uL (ref 3.87–5.11)
RDW: 12.8 % (ref 11.5–15.5)
WBC: 3.3 10*3/uL — ABNORMAL LOW (ref 4.0–10.5)

## 2023-02-14 LAB — COMPREHENSIVE METABOLIC PANEL
ALT: 15 U/L (ref 0–35)
AST: 18 U/L (ref 0–37)
Albumin: 4.4 g/dL (ref 3.5–5.2)
Alkaline Phosphatase: 86 U/L (ref 39–117)
BUN: 13 mg/dL (ref 6–23)
CO2: 27 mEq/L (ref 19–32)
Calcium: 9.5 mg/dL (ref 8.4–10.5)
Chloride: 102 mEq/L (ref 96–112)
Creatinine, Ser: 0.82 mg/dL (ref 0.40–1.20)
GFR: 69.53 mL/min (ref >60.00)
Glucose, Bld: 102 mg/dL — ABNORMAL HIGH (ref 70–99)
Potassium: 4.5 mEq/L (ref 3.5–5.1)
Sodium: 137 mEq/L (ref 135–145)
Total Bilirubin: 0.5 mg/dL (ref 0.2–1.2)
Total Protein: 7.1 g/dL (ref 6.0–8.3)

## 2023-02-14 LAB — URINALYSIS
Bilirubin Urine: NEGATIVE
Hgb urine dipstick: NEGATIVE
Ketones, ur: NEGATIVE
Leukocytes,Ua: NEGATIVE
Nitrite: NEGATIVE
Specific Gravity, Urine: 1.02 (ref 1.000–1.030)
Total Protein, Urine: NEGATIVE
Urine Glucose: NEGATIVE
Urobilinogen, UA: 0.2 (ref 0.0–1.0)
pH: 7 (ref 5.0–8.0)

## 2023-02-14 LAB — LIPID PANEL
Cholesterol: 157 mg/dL (ref 0–200)
HDL: 63.6 mg/dL (ref >39.00)
LDL Cholesterol: 72 mg/dL (ref 0–99)
NonHDL: 93.32
Total CHOL/HDL Ratio: 2
Triglycerides: 106 mg/dL (ref 0.0–149.0)
VLDL: 21.2 mg/dL (ref 0.0–40.0)

## 2023-02-14 LAB — TSH: TSH: 1.43 u[IU]/mL (ref 0.35–5.50)

## 2023-02-14 LAB — HEMOGLOBIN A1C: Hgb A1c MFr Bld: 5.8 % (ref 4.6–6.5)

## 2023-02-20 DIAGNOSIS — H04123 Dry eye syndrome of bilateral lacrimal glands: Secondary | ICD-10-CM | POA: Diagnosis not present

## 2023-02-20 DIAGNOSIS — H35411 Lattice degeneration of retina, right eye: Secondary | ICD-10-CM | POA: Diagnosis not present

## 2023-02-20 DIAGNOSIS — H02834 Dermatochalasis of left upper eyelid: Secondary | ICD-10-CM | POA: Diagnosis not present

## 2023-02-20 DIAGNOSIS — H02831 Dermatochalasis of right upper eyelid: Secondary | ICD-10-CM | POA: Diagnosis not present

## 2023-02-20 DIAGNOSIS — H401111 Primary open-angle glaucoma, right eye, mild stage: Secondary | ICD-10-CM | POA: Diagnosis not present

## 2023-02-20 DIAGNOSIS — D3131 Benign neoplasm of right choroid: Secondary | ICD-10-CM | POA: Diagnosis not present

## 2023-02-20 DIAGNOSIS — H43812 Vitreous degeneration, left eye: Secondary | ICD-10-CM | POA: Diagnosis not present

## 2023-02-20 DIAGNOSIS — H25813 Combined forms of age-related cataract, bilateral: Secondary | ICD-10-CM | POA: Diagnosis not present

## 2023-02-20 DIAGNOSIS — H40022 Open angle with borderline findings, high risk, left eye: Secondary | ICD-10-CM | POA: Diagnosis not present

## 2023-03-10 ENCOUNTER — Telehealth: Payer: Self-pay | Admitting: Internal Medicine

## 2023-03-10 NOTE — Telephone Encounter (Signed)
Pt c/o medication issue:  1. Name of Medication:  Evolocumab (REPATHA SURECLICK) 140 MG/ML SOAJ  2. How are you currently taking this medication (dosage and times per day)?   3. Are you having a reaction (difficulty breathing--STAT)?   4. What is your medication issue?   Patient states she is in the donut hole and her medication will cost her $300+. She would like to know if she can be prescribed Simvastatin or Ezetimibe.

## 2023-03-10 NOTE — Telephone Encounter (Signed)
Left message for patient informing her we will need to ask Dr. Tenny Craw about prescribing simvastatin and/or ezetimibe. Also informed patient via message that Dr. Tenny Craw is out of the office until next week.  Will forward message to Dr. Tenny Craw and follow-up with patient with her response.

## 2023-03-14 NOTE — Telephone Encounter (Signed)
 Left a message for the pt to call back.  

## 2023-03-14 NOTE — Telephone Encounter (Signed)
She is supposed to be taking rosuvastatin - is she on this? If so, would not have her take simvastatin since it would be considered duplicate therapy. It looks like she's previously intolerant to higher doses of rosuvastatin and atorvastatin. Simvastatin is typically tolerated worse than rosuvastatin, and is also less effective. That is just fine to restart her ezetimibe though if her Repatha is currently unaffordable. She should continue on rosuvastatin 5mg  daily.

## 2023-03-14 NOTE — Telephone Encounter (Signed)
Patient is calling again to get update on medication suggestion. Please advise.

## 2023-03-14 NOTE — Telephone Encounter (Signed)
Messages sent to Pharm D for assistance.

## 2023-03-15 NOTE — Telephone Encounter (Signed)
I spoke with the Sharon Paul and she reports that she will keep using the Repatha until she gets out if the donut hole for now and will let us know if she has any further problems.

## 2023-03-16 DIAGNOSIS — Z96611 Presence of right artificial shoulder joint: Secondary | ICD-10-CM | POA: Diagnosis not present

## 2023-03-29 ENCOUNTER — Telehealth: Payer: Self-pay | Admitting: Internal Medicine

## 2023-03-29 MED ORDER — DIPHENOXYLATE-ATROPINE 2.5-0.025 MG PO TABS: 1.0000 | ORAL_TABLET | Freq: Four times a day (QID) | ORAL | 1 refills | Status: AC | PRN

## 2023-03-29 NOTE — Telephone Encounter (Signed)
Medication is Diphenoxylate And Atropine (Oral Route)

## 2023-03-29 NOTE — Addendum Note (Signed)
Addended by: Tresa Garter on: 03/29/2023 11:35 PM   Modules accepted: Orders

## 2023-03-29 NOTE — Telephone Encounter (Signed)
Pt called stated that Dr. Macario Golds had prescribe medication before and she was wondering if he could prescribe it again pt stated she is having diarrhea for last past 2 days and Dr. Macario Golds is familiar with her situation. Please advise.

## 2023-03-30 NOTE — Telephone Encounter (Signed)
Called pt no answer LMOM MD sent to [facility].Marland KitchenRaechel Chute

## 2023-04-07 ENCOUNTER — Other Ambulatory Visit: Payer: Self-pay | Admitting: Nurse Practitioner

## 2023-04-07 DIAGNOSIS — N952 Postmenopausal atrophic vaginitis: Secondary | ICD-10-CM

## 2023-04-10 NOTE — Telephone Encounter (Addendum)
Med refill request: estradiol cream Last AEX: 07/21/2021, last seen 03/11/2022 for problem Next AEX: scheduled for 07/26/2023 Last MMG (if hormonal med): 10/06/2022-neg birads 1: Cat B Refill authorized: Rx pend.

## 2023-04-13 ENCOUNTER — Encounter (INDEPENDENT_AMBULATORY_CARE_PROVIDER_SITE_OTHER): Payer: Self-pay

## 2023-04-29 ENCOUNTER — Other Ambulatory Visit: Payer: Self-pay | Admitting: Internal Medicine

## 2023-05-09 ENCOUNTER — Encounter: Payer: Self-pay | Admitting: Internal Medicine

## 2023-05-09 ENCOUNTER — Ambulatory Visit (INDEPENDENT_AMBULATORY_CARE_PROVIDER_SITE_OTHER): Payer: Medicare Other | Admitting: Internal Medicine

## 2023-05-09 VITALS — BP 116/68 | HR 57 | Temp 97.8°F | Ht 59.0 in | Wt 124.0 lb

## 2023-05-09 DIAGNOSIS — R197 Diarrhea, unspecified: Secondary | ICD-10-CM

## 2023-05-09 DIAGNOSIS — I10 Essential (primary) hypertension: Secondary | ICD-10-CM

## 2023-05-09 DIAGNOSIS — R1012 Left upper quadrant pain: Secondary | ICD-10-CM | POA: Diagnosis not present

## 2023-05-09 NOTE — Assessment & Plan Note (Signed)
Pt complaining of diarrhea and abdominal cramps that happen every time she would eat out or have a takeout.  Spicy and greasy foods are more likely to make it happen next am.  Lomotil, Activated Charcoal help... Try Digestive enzymes OTC GI consultation with Doug Sou, PA - June 22, 2023

## 2023-05-09 NOTE — Progress Notes (Signed)
Subjective:  Patient ID: Sharon Paul, female    DOB: 02-27-1946  Age: 77 y.o. MRN: [id number]  CC: Medical Management of Chronic Issues (3 month f/u, would also like to discuss upset stomach recently)   HPI Sharon Paul presents for diarrhea since March She was in [place] in Feb 2024  If eating at home - fine Eating out on Fri, Sat - diarrhea on Sat and Sunday am x2-3 times. Lomotil helps w/diarrhea  C/o new LUQ abd pain off and on x 2 months w/diarrhea   Outpatient Medications Prior to Visit  Medication Sig Dispense Refill   acetaminophen (TYLENOL) 500 MG tablet Take 500 mg by mouth every 6 (six) hours as needed for moderate pain.     ALPRAZolam (XANAX) 0.25 MG tablet Take 1 tablet (0.25 mg total) by mouth 2 (two) times daily as needed for anxiety. 30 tablet 0   alum & mag hydroxide-simeth (MAALOX/MYLANTA) 200-200-20 MG/5ML suspension Take 15 mLs by mouth at bedtime as needed for indigestion or heartburn.     amLODipine (NORVASC) 10 MG tablet Take 1 tablet (10 mg total) by mouth daily. 90 tablet 3   aspirin 81 MG tablet Take 81 mg by mouth daily.     Calcium-Vitamin D-Vitamin K 650-12.5-40 MG-MCG-MCG CHEW Chew 1 tablet by mouth daily.     Cholecalciferol 2000 UNITS TABS Take 2,000 Units by mouth daily.     Coenzyme Q10 (CO Q-10 PO) Take 1 capsule by mouth daily.     diphenoxylate-atropine (LOMOTIL) 2.5-0.025 MG tablet Take 1 tablet by mouth 4 (four) times daily as needed for diarrhea or loose stools. 60 tablet 1   estradiol (ESTRACE) 0.1 MG/GM vaginal cream INSERT 1GM VAGINALLY TWICE WEEKLY. 42.5 g 0   Evolocumab (REPATHA SURECLICK) 140 MG/ML SOAJ INJECT 1 PEN INTO THE SKIN EVERY 14 (FOURTEEN) DAYS. 2 mL 11   fexofenadine (ALLEGRA) 180 MG tablet Take 180 mg by mouth daily.     FLUoxetine (PROZAC) 10 MG capsule TAKE 1 CAPSULE BY MOUTH DAILY 90 capsule 2   fluticasone (FLONASE) 50 MCG/ACT nasal spray Place 2 sprays into both nostrils daily as needed for allergies.  16 g 5   ipratropium (ATROVENT) 0.06 % nasal spray PLACE 2 SPRAYS INTO THE NOSE 3 (THREE) TIMES DAILY. 30 mL 2   irbesartan (AVAPRO) 300 MG tablet TAKE 0.5 TABLETS (150 MG TOTAL) BY MOUTH 2 (TWO) TIMES DAILY. 90 tablet 1   labetalol (NORMODYNE) 300 MG tablet TAKE 1 TABLET BY MOUTH TWICE  DAILY 200 tablet 1   LORazepam (ATIVAN) 0.5 MG tablet TAKE 1 TABLET BY MOUTH TWICE A DAY 60 tablet 3   meloxicam (MOBIC) 15 MG tablet Take 1 tablet (15 mg total) by mouth daily. 30 tablet 2   Multiple Vitamins-Minerals (MULTIVITAMIN WOMEN 50+) TABS Take 1 tablet by mouth daily.     ondansetron (ZOFRAN) 4 MG tablet Take 1 tablet (4 mg total) by mouth every 8 (eight) hours as needed for nausea or vomiting. 10 tablet 0   oxyCODONE-acetaminophen (PERCOCET) 5-325 MG tablet Take 1 tablet by mouth every 4 (four) hours as needed (max 6 q). 20 tablet 0   pantoprazole (PROTONIX) 40 MG tablet TAKE 1 TABLET BY MOUTH  DAILY ANNUAL APPOINTMENT  DUE IN FEB 2023 MUST SEE  PROVIDER FOR FUTURE REFILLS 90 tablet 3   rosuvastatin (CRESTOR) 5 MG tablet TAKE 1 TABLET BY MOUTH EVERY DAY 90 tablet 0   traMADol-acetaminophen (ULTRACET) 37.5-325 MG tablet TAKE 1 TABLET BY MOUTH EVERY 6  HOURS AS NEEDED 100 tablet 1   valACYclovir (VALTREX) 1000 MG tablet Take 2 tablets at the earliest onset of cold sore symptoms.  Repeat with 2 tablets 12 hours later 12 tablet 1   metroNIDAZOLE (FLAGYL) 500 MG tablet Take 1 tablet (500 mg total) by mouth 2 (two) times daily. 20 tablet 0   No facility-administered medications prior to visit.    ROS: Review of Systems  Constitutional:  Negative for activity change, appetite change, chills, fatigue and unexpected weight change.  HENT:  Negative for congestion, mouth sores and sinus pressure.   Eyes:  Negative for visual disturbance.  Respiratory:  Negative for cough and chest tightness.   Gastrointestinal:  Positive for diarrhea. Negative for abdominal pain and nausea.  Genitourinary:  Negative for  difficulty urinating, frequency and vaginal pain.  Musculoskeletal:  Negative for back pain and gait problem.  Skin:  Negative for pallor and rash.  Neurological:  Negative for dizziness, tremors, weakness, numbness and headaches.  Psychiatric/Behavioral:  Negative for confusion and sleep disturbance.     Objective:  BP 116/68 (BP Location: Left Arm, Patient Position: Sitting, Cuff Size: Large)   Pulse (!) 57   Temp 97.8 F (36.6 C) (Temporal)   Ht 4\' 11"  (1.499 m)   Wt 124 lb (56.2 kg)   SpO2 97%   BMI 25.04 kg/m   BP Readings from Last 3 Encounters:  05/09/23 116/68  02/02/23 120/78  11/28/22 118/60    Wt Readings from Last 3 Encounters:  05/09/23 124 lb (56.2 kg)  02/02/23 125 lb (56.7 kg)  11/28/22 124 lb (56.2 kg)    Physical Exam Constitutional:      General: She is not in acute distress.    Appearance: She is well-developed.  HENT:     Head: Normocephalic.     Right Ear: External ear normal.     Left Ear: External ear normal.     Nose: Nose normal.  Eyes:     General:        Right eye: No discharge.        Left eye: No discharge.     Conjunctiva/sclera: Conjunctivae normal.     Pupils: Pupils are equal, round, and reactive to light.  Neck:     Thyroid: No thyromegaly.     Vascular: No JVD.     Trachea: No tracheal deviation.  Cardiovascular:     Rate and Rhythm: Normal rate and regular rhythm.     Heart sounds: Normal heart sounds.  Pulmonary:     Effort: No respiratory distress.     Breath sounds: No stridor. No wheezing.  Abdominal:     General: Bowel sounds are normal. There is no distension.     Palpations: Abdomen is soft. There is no mass.     Tenderness: There is no abdominal tenderness. There is no guarding or rebound.  Musculoskeletal:        General: No tenderness.     Cervical back: Normal range of motion and neck supple. No rigidity.  Lymphadenopathy:     Cervical: No cervical adenopathy.  Skin:    Findings: No erythema or rash.   Neurological:     Cranial Nerves: No cranial nerve deficit.     Motor: No abnormal muscle tone.     Coordination: Coordination normal.     Deep Tendon Reflexes: Reflexes normal.  Psychiatric:        Behavior: Behavior normal.        Thought Content: Thought  content normal.        Judgment: Judgment normal.     Lab Results  Component Value Date   WBC 3.3 (L) 02/14/2023   HGB 12.0 02/14/2023   HCT 36.1 02/14/2023   PLT 293.0 02/14/2023   GLUCOSE 102 (H) 02/14/2023   CHOL 157 02/14/2023   TRIG 106.0 02/14/2023   HDL 63.60 02/14/2023   LDLDIRECT 138.2 10/22/2010   LDLCALC 72 02/14/2023   ALT 15 02/14/2023   AST 18 02/14/2023   NA 137 02/14/2023   K 4.5 02/14/2023   CL 102 02/14/2023   CREATININE 0.82 02/14/2023   BUN 13 02/14/2023   CO2 27 02/14/2023   TSH 1.43 02/14/2023   HGBA1C 5.8 02/14/2023    No results found.  Assessment & Plan:   Problem List Items Addressed This Visit     Diarrhea - Primary    Pt complaining of diarrhea and abdominal cramps that happen every time she would eat out or have a takeout.  Spicy and greasy foods are more likely to make it happen next am.  Lomotil, Activated Charcoal help... Try Digestive enzymes OTC GI consultation with Doug Sou, PA - June 22, 2023       Relevant Orders   US Abdomen Complete   Giardia/cryptosporidium (EIA)   Clostridium difficile EIA   GI Profile, Stool, PCR   Stool, WBC/Lactoferrin   Fecal fat, qualitative   Essential hypertension     Monitor BP      LUQ abdominal pain    new LUQ abd pain off and on x 2 months w/diarrhea Check abd [place]      Relevant Orders   Giardia/cryptosporidium (EIA)   Clostridium difficile EIA   GI Profile, Stool, PCR   Stool, WBC/Lactoferrin   Fecal fat, qualitative      No orders of the defined types were placed in this encounter.     Follow-up: Return in about 3 months (around 08/08/2023) for a follow-up visit.  Sonda Primes, MD

## 2023-05-09 NOTE — Assessment & Plan Note (Signed)
new LUQ abd pain off and on x 2 months w/diarrhea Check abd [place]

## 2023-05-15 NOTE — Assessment & Plan Note (Signed)
Monitor BP

## 2023-05-17 ENCOUNTER — Other Ambulatory Visit: Payer: Self-pay | Admitting: Internal Medicine

## 2023-05-18 ENCOUNTER — Other Ambulatory Visit: Payer: Medicare Other

## 2023-05-24 ENCOUNTER — Ambulatory Visit: Payer: Medicare Other

## 2023-05-25 ENCOUNTER — Ambulatory Visit
Admission: RE | Admit: 2023-05-25 | Discharge: 2023-05-25 | Disposition: A | Discharge status: Home / Self Care | Payer: Medicare Other | Source: Ambulatory Visit | Attending: Internal Medicine | Admitting: Internal Medicine

## 2023-05-25 DIAGNOSIS — R197 Diarrhea, unspecified: Secondary | ICD-10-CM | POA: Diagnosis not present

## 2023-05-25 DIAGNOSIS — R109 Unspecified abdominal pain: Secondary | ICD-10-CM | POA: Diagnosis not present

## 2023-05-28 ENCOUNTER — Other Ambulatory Visit: Payer: Self-pay | Admitting: Internal Medicine

## 2023-06-01 ENCOUNTER — Telehealth: Payer: Self-pay | Admitting: Internal Medicine

## 2023-06-01 NOTE — Telephone Encounter (Signed)
Pt called inquiring about results from radiology. Please advise.

## 2023-06-02 NOTE — Telephone Encounter (Signed)
Imaging has not been resulted as of yet. Once radiologist sends in report pt will be contacted.

## 2023-06-02 NOTE — Telephone Encounter (Signed)
LVM for pt in regards to [place] not having her imaging results as of yet. However, once we do have them in we will call her with those results.

## 2023-06-04 ENCOUNTER — Other Ambulatory Visit: Payer: Self-pay | Admitting: Internal Medicine

## 2023-06-05 ENCOUNTER — Other Ambulatory Visit: Payer: Self-pay

## 2023-06-06 ENCOUNTER — Ambulatory Visit: Payer: Medicare Other

## 2023-06-09 ENCOUNTER — Ambulatory Visit: Payer: Medicare Other

## 2023-06-09 DIAGNOSIS — Z23 Encounter for immunization: Secondary | ICD-10-CM

## 2023-06-09 NOTE — Progress Notes (Cosign Needed Addendum)
HD flu vaccine given.  Pt tolerated well. Pt is aware to give the office a call for an side effects or reactions. Please co-sign.   Medical screening examination/treatment/procedure(s) were performed by non-physician practitioner and as supervising physician I was immediately available for consultation/collaboration.  I agree with above. Jacinta Shoe, MD

## 2023-06-22 ENCOUNTER — Encounter: Payer: Self-pay | Admitting: Gastroenterology

## 2023-06-22 ENCOUNTER — Ambulatory Visit: Payer: Medicare Other | Admitting: Gastroenterology

## 2023-06-22 VITALS — BP 98/58 | HR 60 | Ht <= 58 in | Wt 125.0 lb

## 2023-06-22 DIAGNOSIS — Z1211 Encounter for screening for malignant neoplasm of colon: Secondary | ICD-10-CM | POA: Diagnosis not present

## 2023-06-22 DIAGNOSIS — F1090 Alcohol use, unspecified, uncomplicated: Secondary | ICD-10-CM | POA: Diagnosis not present

## 2023-06-22 NOTE — Progress Notes (Signed)
06/22/2023 Sharon Paul [id number] 10/02/1945   HISTORY OF PRESENT ILLNESS: This is a 77 year old female who is a patient of Dr. Lanetta Inch.  She is here today as she had made this appointment a while back for complaints of diarrhea.  She goes to [place] every year in February.  That is where she was born and raised.  A few weeks after returning here she was having severe diarrhea.  She saw her PCP and he had given her some antibiotics and Lomotil.  Then he recommended that she begin taking an over-the-counter Super Enzyme.  She says that since she started that she has seen complete resolution of her symptoms.  She says it has been "like a miracle".  Now she only has diarrhea if she eats [place] food that is greasy/spicy.  He had also ordered stool studies but she never had to have those performed.  She feels well currently.  Her last colonoscopy was in 2014.  Does have a remote history of adenomatous polyps.  Had similar symptoms of infectious diarrhea in the past after returning from [place] as well.   Past Medical History:  Diagnosis Date   Anxiety    Arthritis    Celiac sprue 2007   CVD (cardiovascular disease)    Foot fracture, left 2011   GERD (gastroesophageal reflux disease)    Hyperlipidemia    Hypertension    Osteopenia 04/2019   T score -1.9 FRAX 10% / 1.9%.  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 Her smoking use included cigarettes. She has never used smokeless tobacco. She reports current alcohol use. She reports that she does not use drugs. family history includes Glaucoma in her brother and mother; Heart disease in her father and mother; Hypertension in her brother, father, mother, paternal grandmother, and sister; Lung cancer in her paternal grandmother; Stroke in her father. Allergies  Allergen Reactions   Augmentin [Amoxicillin-Pot Clavulanate] Diarrhea   Clarithromycin     REACTION: gi upset. Can take Zpac   Codeine Nausea And Vomiting    Can take Prom-cod syr   Esomeprazole Magnesium     REACTION: side pain   Hydrochlorothiazide     REACTION: leg cramps, dehydration   Iohexol      Desc: PT HAS SWELLING TO FACE,LIPS AND THROAT WITH CONTRAST DYE    Losartan Potassium     REACTION: HA   Meloxicam Other (See Comments)   Olmesartan Medoxomil     REACTION: weak legs   Ramipril     Per pt: unknown reaction   Risedronate Sodium     Per pt: unknown reaction   Tizanidine Other (See Comments)   Kapidex [Dexlansoprazole] Rash      Outpatient Encounter Medications  as of 06/22/2023  Medication Sig   acetaminophen (TYLENOL) 500 MG tablet Take 500 mg by mouth every 6 (six) hours as needed for moderate pain.   ALPRAZolam (XANAX) 0.25 MG tablet Take 1 tablet (0.25 mg total) by mouth 2 (two) times daily as needed for anxiety.   alum & mag hydroxide-simeth (MAALOX/MYLANTA) 200-200-20 MG/5ML suspension Take 15 mLs by mouth at bedtime as needed for indigestion or heartburn.   amLODipine (NORVASC) 10 MG tablet TAKE 1 TABLET BY MOUTH EVERY DAY   aspirin 81 MG tablet Take 81 mg by mouth daily.   Calcium-Vitamin D-Vitamin K 650-12.5-40 MG-MCG-MCG CHEW Chew 1 tablet by mouth daily.   Cholecalciferol 2000 UNITS TABS Take 2,000 Units by mouth daily.   Coenzyme Q10 (CO Q-10 PO) Take 1 capsule by mouth  daily.   diphenoxylate-atropine (LOMOTIL) 2.5-0.025 MG tablet Take 1 tablet by mouth 4 (four) times daily as needed for diarrhea or loose stools.   estradiol (ESTRACE) 0.1 MG/GM vaginal cream INSERT 1GM VAGINALLY TWICE WEEKLY.   Evolocumab (REPATHA SURECLICK) 140 MG/ML SOAJ INJECT 1 PEN INTO THE SKIN EVERY 14 (FOURTEEN) DAYS.   fexofenadine (ALLEGRA) 180 MG tablet Take 180 mg by mouth daily.   FLUoxetine (PROZAC) 10 MG capsule TAKE 1 CAPSULE BY MOUTH DAILY   fluticasone (FLONASE) 50 MCG/ACT nasal spray Place 2 sprays into both nostrils daily as needed for allergies.   ipratropium (ATROVENT) 0.06 % nasal spray PLACE 2 SPRAYS INTO THE NOSE 3 (THREE) TIMES DAILY.   irbesartan (AVAPRO) 300 MG tablet TAKE 1/2 TABLETS (150 MG TOTAL) BY MOUTH 2 (TWO) TIMES DAILY.   labetalol (NORMODYNE) 300 MG tablet TAKE 1 TABLET BY MOUTH TWICE  DAILY   LORazepam (ATIVAN) 0.5 MG tablet TAKE 1 TABLET BY MOUTH TWICE A DAY   meloxicam (MOBIC) 15 MG tablet Take 1 tablet (15 mg total) by mouth daily.   Multiple Vitamins-Minerals (MULTIVITAMIN WOMEN 50+) TABS Take 1 tablet by mouth daily.   pantoprazole (PROTONIX) 40 MG tablet TAKE 1 TABLET BY MOUTH  DAILY ANNUAL APPOINTMENT  DUE IN FEB 2023 MUST SEE  PROVIDER FOR FUTURE REFILLS   rosuvastatin (CRESTOR) 5 MG tablet TAKE 1 TABLET BY MOUTH EVERY DAY   valACYclovir (VALTREX) 1000 MG tablet Take 2 tablets at the earliest onset of cold sore symptoms.  Repeat with 2 tablets 12 hours later   [DISCONTINUED] ondansetron (ZOFRAN) 4 MG tablet Take 1 tablet (4 mg total) by mouth every 8 (eight) hours as needed for nausea or vomiting.   [DISCONTINUED] oxyCODONE-acetaminophen (PERCOCET) 5-325 MG tablet Take 1 tablet by mouth every 4 (four) hours as needed (max 6 q).   [DISCONTINUED] traMADol-acetaminophen (ULTRACET) 37.5-325 MG tablet TAKE 1 TABLET BY MOUTH EVERY 6 HOURS AS NEEDED   No facility-administered encounter medications on file as of 06/22/2023.     REVIEW OF SYSTEMS  : All  other systems reviewed and negative except where noted in the History of Present Illness.   PHYSICAL EXAM: BP (!) 98/58   Pulse 60   Ht 4\' 10"  (1.473 m)   Wt 125 lb (56.7 kg)   BMI 26.13 kg/m  General: Well developed female in no acute distress Head: Normocephalic and atraumatic Eyes:  Sclerae anicteric, conjunctiva pink. Ears: Normal auditory acuity Lungs: Clear throughout to auscultation; no W/R/R. Heart: Regular rate and rhythm; no M/R/G. Abdomen: Soft, non-distended.  BS present. Non-tender. Rectal:  Will be done at the time of colonoscopy. Musculoskeletal: Symmetrical with no gross deformities  Skin: No lesions on  visible extremities Extremities: No edema  Neurological: Alert oriented x 4, grossly non-focal Psychological:  Alert and cooperative. Normal mood and affect  ASSESSMENT AND PLAN: *Acute diarrhea after returning from [place].  This has happened in the past as well, suspect travelers diarrhea/infectious source.  Diarrhea resolved after starting an OTC super enzyme.  Now only has issues if she eats [place] food. *Colon cancer screening: Last colonoscopy was in 2014.  Does not qualify for Cologuard as she has a history of adenomatous polyps prior to 2014.  Will plan for colonoscopy with Dr. Adela Lank, but she would like to wait until February or March of next year.   CC:  Plotnikov, Georgina Quint, MD

## 2023-06-22 NOTE — Progress Notes (Signed)
Agree with assessment and plan as outlined.  

## 2023-06-28 DIAGNOSIS — H04123 Dry eye syndrome of bilateral lacrimal glands: Secondary | ICD-10-CM | POA: Diagnosis not present

## 2023-06-28 DIAGNOSIS — H25813 Combined forms of age-related cataract, bilateral: Secondary | ICD-10-CM | POA: Diagnosis not present

## 2023-06-28 DIAGNOSIS — H02834 Dermatochalasis of left upper eyelid: Secondary | ICD-10-CM | POA: Diagnosis not present

## 2023-06-28 DIAGNOSIS — H02831 Dermatochalasis of right upper eyelid: Secondary | ICD-10-CM | POA: Diagnosis not present

## 2023-06-28 DIAGNOSIS — H40022 Open angle with borderline findings, high risk, left eye: Secondary | ICD-10-CM | POA: Diagnosis not present

## 2023-06-28 DIAGNOSIS — H401111 Primary open-angle glaucoma, right eye, mild stage: Secondary | ICD-10-CM | POA: Diagnosis not present

## 2023-07-26 ENCOUNTER — Ambulatory Visit: Payer: Medicare Other | Admitting: Nurse Practitioner

## 2023-07-26 ENCOUNTER — Encounter: Payer: Self-pay | Admitting: Nurse Practitioner

## 2023-07-26 VITALS — BP 104/64 | HR 60 | Ht <= 58 in | Wt 124.0 lb

## 2023-07-26 DIAGNOSIS — Z9289 Personal history of other medical treatment: Secondary | ICD-10-CM | POA: Diagnosis not present

## 2023-07-26 DIAGNOSIS — Z01419 Encounter for gynecological examination (general) (routine) without abnormal findings: Secondary | ICD-10-CM

## 2023-07-26 DIAGNOSIS — M8589 Other specified disorders of bone density and structure, multiple sites: Secondary | ICD-10-CM

## 2023-07-26 DIAGNOSIS — Z9189 Other specified personal risk factors, not elsewhere classified: Secondary | ICD-10-CM

## 2023-07-26 DIAGNOSIS — B009 Herpesviral infection, unspecified: Secondary | ICD-10-CM

## 2023-07-26 DIAGNOSIS — N952 Postmenopausal atrophic vaginitis: Secondary | ICD-10-CM | POA: Diagnosis not present

## 2023-07-26 DIAGNOSIS — Z780 Asymptomatic menopausal state: Secondary | ICD-10-CM

## 2023-07-26 DIAGNOSIS — N3941 Urge incontinence: Secondary | ICD-10-CM

## 2023-07-26 MED ORDER — ESTRADIOL 0.1 MG/GM VA CREA: TOPICAL_CREAM | VAGINAL | 3 refills | Status: DC

## 2023-07-26 NOTE — Progress Notes (Signed)
Sharon Paul [birth year] [id number]   History:  77 y.o. [id number] presents for breast and pelvic exam. Postmenopausal. S/P 1993 TAH for fibroids. Using vaginal estrogen for atrophy. Normal pap and mammogram history. H/O HTN, HLD, PVD, IBS. Having some urinary urgency that is not new for her. Leaks small amounts of urine if she does not get to the bathroom in time.   Gynecologic History No LMP recorded. Patient has had a hysterectomy.   Contraception: status post hysterectomy Sexually active: Yes  Health Maintenance Last Pap: 2012. Results were: Normal Last mammogram: 10/06/2022. Results were: Normal Last colonoscopy: 03/06/2013. Results were: Normal, 10-year recall Last Dexa: 08/16/2021. Results were: T-score - 1.9, FRAX 10% / 2.0%  Past medical history, past surgical history, family history and social history were all reviewed and documented in the EPIC chart. Married. 2 daughters - 1 in [place] with 2 grandchildren, 1 here with 2 grandchildren.   ROS:  A ROS was performed and pertinent positives and negatives are included.  Exam:  Vitals:   07/26/23 1406  BP: 104/64  Pulse: 60  SpO2: 98%  Weight: 124 lb (56.2 kg)  Height: 4' 8.75" (1.441 m)    Body mass index is 27.07 kg/m.  General appearance:  Normal Thyroid:  Symmetrical, normal in size, without palpable masses or nodularity. Respiratory  Auscultation:  Clear without wheezing or rhonchi Cardiovascular  Auscultation:  Regular rate, without rubs, murmurs or gallops  Edema/varicosities:  Not grossly evident Abdominal  Soft,nontender, without masses, guarding or rebound.  Liver/spleen:  No organomegaly noted  Hernia:  None appreciated  Skin  Inspection:  Grossly normal Breasts: Examined lying and sitting.   Right: Without masses, retractions, nipple discharge or axillary adenopathy.   Left: Without masses, retractions, nipple discharge or axillary adenopathy. Pelvic: External genitalia:  no lesions               Urethra:  normal appearing urethra with no masses, tenderness or lesions              Bartholins and Skenes: normal                 Vagina: normal appearing vagina with normal color and discharge, no lesions. Atrophic changes              Cervix: absent Bimanual Exam:  Uterus: absent              Adnexa: no mass, fullness, tenderness              Rectovaginal: Deferred              Anus:  normal, no lesions  Patient informed chaperone available to be present for breast and pelvic exam. Patient has requested no chaperone to be present. Patient has been advised what will be completed during breast and pelvic exam.   Assessment/Plan:  77 y.o. [id number] for breast and pelvic exam.    Encounter for breast and pelvic examination - Education provided on SBEs, importance of preventative screenings, current guidelines, high calcium diet, regular exercise, and multivitamin daily.  Labs with PCP.   Postmenopausal -  S/P 1993 TAH for fibroid.   Osteopenia of multiple sites - Plan: DG Bone Density. 07/2021 T-score -1.9 without elevated FRAX. Will schedule at [facility].   Vaginal atrophy - Plan: estradiol (ESTRACE) 0.1 MG/GM vaginal cream twice weekly. Only doing once weekly. Recommend increasing to twice weekly as this may also help some with incontinence.   Urge incontinence - We discussed causes  and lifestyle modifications to help with urge incontinence. If no improvement pelvic floor PT recommended.   Screening for cervical cancer - Normal Pap history. No longer screening per guidelines.   Screening for breast cancer - Normal mammogram history. She has had benign lumpectomies years ago. Continue annual screenings.  Normal breast exam today.  Screening for colon cancer - 2014 colonoscopy. Plans to have colonoscopy early next year.   Return in about 2 years (around 07/25/2025) for B&P.   Olivia Mackie DNP, 2:42 PM 07/26/2023

## 2023-07-29 ENCOUNTER — Other Ambulatory Visit: Payer: Self-pay | Admitting: Internal Medicine

## 2023-08-06 ENCOUNTER — Other Ambulatory Visit: Payer: Self-pay | Admitting: Internal Medicine

## 2023-08-16 ENCOUNTER — Other Ambulatory Visit: Payer: Self-pay | Admitting: Internal Medicine

## 2023-08-21 ENCOUNTER — Other Ambulatory Visit: Payer: Self-pay | Admitting: Internal Medicine

## 2023-08-21 DIAGNOSIS — I739 Peripheral vascular disease, unspecified: Secondary | ICD-10-CM

## 2023-08-21 DIAGNOSIS — E785 Hyperlipidemia, unspecified: Secondary | ICD-10-CM

## 2023-09-06 NOTE — Progress Notes (Addendum)
 This encounter was created in error - please disregard.  I called patient x 3 and left a detailed message the 3rd time to call office and reschedule this AWV .    Medical screening examination/treatment/procedure(s) were performed by non-physician practitioner and as supervising physician I was immediately available for consultation/collaboration.  I agree with above. Karlynn Noel, MD

## 2023-09-07 ENCOUNTER — Encounter: Payer: Self-pay | Admitting: Internal Medicine

## 2023-09-07 ENCOUNTER — Ambulatory Visit (INDEPENDENT_AMBULATORY_CARE_PROVIDER_SITE_OTHER): Payer: Medicare Other | Admitting: Internal Medicine

## 2023-09-07 VITALS — BP 110/60 | HR 60 | Temp 98.3°F | Ht <= 58 in | Wt 124.2 lb

## 2023-09-07 DIAGNOSIS — R197 Diarrhea, unspecified: Secondary | ICD-10-CM

## 2023-09-07 DIAGNOSIS — R002 Palpitations: Secondary | ICD-10-CM | POA: Diagnosis not present

## 2023-09-07 DIAGNOSIS — F411 Generalized anxiety disorder: Secondary | ICD-10-CM | POA: Diagnosis not present

## 2023-09-07 DIAGNOSIS — I10 Essential (primary) hypertension: Secondary | ICD-10-CM

## 2023-09-07 NOTE — Assessment & Plan Note (Addendum)
 New Probable PACs or PVCs vs other Reduce caffeine intake Check CBC, TSH, CMET, mag EKG obtained Ziopatch option was discussed  F/u w/Dr Tenny Craw

## 2023-09-07 NOTE — Progress Notes (Signed)
 Subjective:  Patient ID: Sharon Paul, female    DOB: [birth year]  Age: 78 y.o. MRN: [id number]  CC: Palpitations (Notes of history of heart fluttering, since christmas these have become more frequent. Yesterday patient had drank tea with her lunch and within several hours started having palpitations. If she clears her throat or straightens up her back it seems to stop the palpitations.)   HPI Sharon Paul presents for palpitations (Notes of history of heart fluttering, since christmas these have become more frequent. Yesterday patient had drank tea with her lunch and within several hours started having palpitations. If she clears her throat or straightens up her back it seems to stop the palpitations.) x 3 weeks No CP The patient has been drinking a lot of black tea F/u diarrhea  Outpatient Medications Prior to Visit  Medication Sig Dispense Refill   acetaminophen  (TYLENOL ) 500 MG tablet Take 500 mg by mouth every 6 (six) hours as needed for moderate pain.     ALPRAZolam  (XANAX ) 0.25 MG tablet Take 1 tablet (0.25 mg total) by mouth 2 (two) times daily as needed for anxiety. 30 tablet 0   alum & mag hydroxide-simeth (MAALOX/MYLANTA) 200-200-20 MG/5ML suspension Take 15 mLs by mouth at bedtime as needed for indigestion or heartburn.     amLODipine  (NORVASC ) 10 MG tablet TAKE 1 TABLET BY MOUTH EVERY DAY 90 tablet 3   aspirin  81 MG tablet Take 81 mg by mouth daily.     Cholecalciferol 2000 UNITS TABS Take 2,000 Units by mouth daily.     diphenoxylate -atropine  (LOMOTIL ) 2.5-0.025 MG tablet Take 1 tablet by mouth 4 (four) times daily as needed for diarrhea or loose stools. 60 tablet 1   estradiol  (ESTRACE ) 0.1 MG/GM vaginal cream Insert 1gm vaginally twice weekly. 42.5 g 3   Evolocumab  (REPATHA  SURECLICK) 140 MG/ML SOAJ INJECT 1 PEN INTO THE SKIN EVERY 14 (FOURTEEN) DAYS. 6 mL 0   fexofenadine (ALLEGRA) 180 MG tablet Take 180 mg by mouth daily.     FLUoxetine  (PROZAC ) 10 MG  capsule TAKE 1 CAPSULE BY MOUTH DAILY 90 capsule 2   fluticasone  (FLONASE ) 50 MCG/ACT nasal spray Place 2 sprays into both nostrils daily as needed for allergies. 16 g 5   irbesartan  (AVAPRO ) 300 MG tablet TAKE 1/2 TABLETS (150 MG TOTAL) BY MOUTH 2 (TWO) TIMES DAILY. 90 tablet 1   labetalol  (NORMODYNE ) 300 MG tablet TAKE 1 TABLET BY MOUTH TWICE  DAILY 200 tablet 2   LORazepam  (ATIVAN ) 0.5 MG tablet TAKE 1 TABLET BY MOUTH TWICE A DAY 60 tablet 3   Multiple Vitamins-Minerals (MULTIVITAMIN WOMEN 50+) TABS Take 1 tablet by mouth daily.     pantoprazole  (PROTONIX ) 40 MG tablet TAKE 1 TABLET BY MOUTH  DAILY ANNUAL APPOINTMENT  DUE IN FEB 2023 MUST SEE  PROVIDER FOR FUTURE REFILLS 90 tablet 3   rosuvastatin  (CRESTOR ) 5 MG tablet TAKE 1 TABLET BY MOUTH EVERY DAY 30 tablet 1   valACYclovir  (VALTREX ) 1000 MG tablet Take 2 tablets at the earliest onset of cold sore symptoms.  Repeat with 2 tablets 12 hours later 12 tablet 1   ipratropium (ATROVENT ) 0.06 % nasal spray PLACE 2 SPRAYS INTO THE NOSE 3 (THREE) TIMES DAILY. 30 mL 2   No facility-administered medications prior to visit.    ROS: Review of Systems  Constitutional:  Negative for activity change, appetite change, chills, fatigue and unexpected weight change.  HENT:  Negative for congestion, mouth sores and sinus pressure.   Eyes:  Negative  for visual disturbance.  Respiratory:  Negative for cough, chest tightness, shortness of breath and wheezing.   Cardiovascular:  Positive for palpitations. Negative for chest pain and leg swelling.  Gastrointestinal:  Negative for abdominal pain and nausea.  Genitourinary:  Negative for difficulty urinating, frequency and vaginal pain.  Musculoskeletal:  Negative for back pain and gait problem.  Skin:  Negative for pallor and rash.  Neurological:  Negative for dizziness, tremors, weakness, numbness and headaches.  Psychiatric/Behavioral:  Negative for confusion and sleep disturbance.     Objective:  BP  110/60   Pulse 60   Temp 98.3 F (36.8 C)   Ht 4' 8.75 (1.441 m)   Wt 124 lb 3.2 oz (56.3 kg)   SpO2 99%   BMI 27.11 kg/m   BP Readings from Last 3 Encounters:  09/07/23 110/60  07/26/23 104/64  06/22/23 (!) 98/58    Wt Readings from Last 3 Encounters:  09/07/23 124 lb 3.2 oz (56.3 kg)  07/26/23 124 lb (56.2 kg)  06/22/23 125 lb (56.7 kg)    Physical Exam Constitutional:      General: She is not in acute distress.    Appearance: Normal appearance. She is well-developed.  HENT:     Head: Normocephalic.     Right Ear: External ear normal.     Left Ear: External ear normal.     Nose: Nose normal.  Eyes:     General:        Right eye: No discharge.        Left eye: No discharge.     Conjunctiva/sclera: Conjunctivae normal.     Pupils: Pupils are equal, round, and reactive to light.  Neck:     Thyroid : No thyromegaly.     Vascular: No JVD.     Trachea: No tracheal deviation.  Cardiovascular:     Rate and Rhythm: Normal rate and regular rhythm.     Heart sounds: Normal heart sounds.  Pulmonary:     Effort: No respiratory distress.     Breath sounds: No stridor. No wheezing.  Abdominal:     General: Bowel sounds are normal. There is no distension.     Palpations: Abdomen is soft. There is no mass.     Tenderness: There is no abdominal tenderness. There is no guarding or rebound.  Musculoskeletal:        General: No tenderness.     Cervical back: Normal range of motion and neck supple. No rigidity.  Lymphadenopathy:     Cervical: No cervical adenopathy.  Skin:    Findings: No erythema or rash.  Neurological:     Mental Status: She is oriented to person, place, and time.     Cranial Nerves: No cranial nerve deficit.     Motor: No abnormal muscle tone.     Coordination: Coordination normal.     Deep Tendon Reflexes: Reflexes normal.  Psychiatric:        Behavior: Behavior normal.        Thought Content: Thought content normal.        Judgment: Judgment  normal.      Procedure: EKG Indication: palpitations Impression: Sinus bradycardia. No acute changes.  Lab Results  Component Value Date   WBC 3.3 (L) 02/14/2023   HGB 12.0 02/14/2023   HCT 36.1 02/14/2023   PLT 293.0 02/14/2023   GLUCOSE 102 (H) 02/14/2023   CHOL 157 02/14/2023   TRIG 106.0 02/14/2023   HDL 63.60 02/14/2023   LDLDIRECT 138.2  10/22/2010   LDLCALC 72 02/14/2023   ALT 15 02/14/2023   AST 18 02/14/2023   NA 137 02/14/2023   K 4.5 02/14/2023   CL 102 02/14/2023   CREATININE 0.82 02/14/2023   BUN 13 02/14/2023   CO2 27 02/14/2023   TSH 1.43 02/14/2023   HGBA1C 5.8 02/14/2023    US  Abdomen Complete Result Date: 06/13/2023 CLINICAL DATA:  Abdominal pain, diarrhea. EXAM: ABDOMEN ULTRASOUND COMPLETE COMPARISON:  None Available. FINDINGS: Gallbladder: No gallstones or wall thickening visualized. No pericholecystic fluid. Common bile duct: Diameter: 0.3cm. Liver: No focal lesion identified. Normal homogeneous echogenicity. IVC: No abnormality visualized. Pancreas: Visualized portion unremarkable. Spleen: Size and appearance within normal limits. Right Kidney: Length: 10.0cm. Echogenicity within normal limits. No mass or hydronephrosis visualized. Left Kidney: Length: 10.8cm. Echogenicity within normal limits. No mass or hydronephrosis visualized. Abdominal aorta: No aneurysm visualized. IMPRESSION: Unremarkable examination of the abdomen. Electronically Signed   By: Fonda Field M.D.   On: 06/13/2023 18:54    Assessment & Plan:   Problem List Items Addressed This Visit     Diarrhea   Better       Generalized anxiety disorder   Doing well overall. Continue on Prozac , Use  Lorazepam  prn  Potential benefits of a long term benzodiazepines  use as well as potential risks  and complications were explained to the patient and were aknowledged.      Essential hypertension   Well controlled - Labetalol , Irbesartan , Norvasc  Monitor BP      Relevant Orders    CBC with Differential/Platelet   Comprehensive metabolic panel   T4, free   Magnesium   Palpitations - Primary   New Probable PACs or PVCs vs other Reduce caffeine intake Check CBC, TSH, CMET, mag EKG obtained Ziopatch option was discussed  F/u w/Dr Okey      Relevant Orders   CBC with Differential/Platelet   Comprehensive metabolic panel   T4, free   Magnesium      No orders of the defined types were placed in this encounter.     Follow-up: Return in about 3 months (around 12/06/2023) for a follow-up visit.  Marolyn Noel, MD

## 2023-09-07 NOTE — Assessment & Plan Note (Signed)
 Better

## 2023-09-07 NOTE — Assessment & Plan Note (Addendum)
 Well controlled - Labetalol, Irbesartan, Norvasc Monitor BP

## 2023-09-11 ENCOUNTER — Other Ambulatory Visit (INDEPENDENT_AMBULATORY_CARE_PROVIDER_SITE_OTHER): Payer: Medicare Other

## 2023-09-11 DIAGNOSIS — R002 Palpitations: Secondary | ICD-10-CM

## 2023-09-11 DIAGNOSIS — I10 Essential (primary) hypertension: Secondary | ICD-10-CM | POA: Diagnosis not present

## 2023-09-11 LAB — CBC WITH DIFFERENTIAL/PLATELET
Basophils Absolute: 0 10*3/uL (ref 0.0–0.1)
Basophils Relative: 1.2 % (ref 0.0–3.0)
Eosinophils Absolute: 0.1 10*3/uL (ref 0.0–0.7)
Eosinophils Relative: 2.2 % (ref 0.0–5.0)
HCT: 36.2 % (ref 36.0–46.0)
Hemoglobin: 11.9 g/dL — ABNORMAL LOW (ref 12.0–15.0)
Lymphocytes Relative: 31.4 % (ref 12.0–46.0)
Lymphs Abs: 1.2 10*3/uL (ref 0.7–4.0)
MCHC: 33 g/dL (ref 30.0–36.0)
MCV: 91.4 fL (ref 78.0–100.0)
Monocytes Absolute: 0.4 10*3/uL (ref 0.1–1.0)
Monocytes Relative: 9.6 % (ref 3.0–12.0)
Neutro Abs: 2.2 10*3/uL (ref 1.4–7.7)
Neutrophils Relative %: 55.6 % (ref 43.0–77.0)
Platelets: 286 10*3/uL (ref 150.0–400.0)
RBC: 3.95 Mil/uL (ref 3.87–5.11)
RDW: 13.4 % (ref 11.5–15.5)
WBC: 3.9 10*3/uL — ABNORMAL LOW (ref 4.0–10.5)

## 2023-09-11 LAB — COMPREHENSIVE METABOLIC PANEL
ALT: 14 U/L (ref 0–35)
AST: 17 U/L (ref 0–37)
Albumin: 4.3 g/dL (ref 3.5–5.2)
Alkaline Phosphatase: 84 U/L (ref 39–117)
BUN: 17 mg/dL (ref 6–23)
CO2: 27 meq/L (ref 19–32)
Calcium: 9.3 mg/dL (ref 8.4–10.5)
Chloride: 104 meq/L (ref 96–112)
Creatinine, Ser: 0.76 mg/dL (ref 0.40–1.20)
GFR: 75.86 mL/min (ref >60.00)
Glucose, Bld: 112 mg/dL — ABNORMAL HIGH (ref 70–99)
Potassium: 3.7 meq/L (ref 3.5–5.1)
Sodium: 136 meq/L (ref 135–145)
Total Bilirubin: 0.4 mg/dL (ref 0.2–1.2)
Total Protein: 6.6 g/dL (ref 6.0–8.3)

## 2023-09-11 LAB — MAGNESIUM: Magnesium: 2 mg/dL (ref 1.5–2.5)

## 2023-09-11 LAB — T4, FREE: Free T4: 0.85 ng/dL (ref 0.60–1.60)

## 2023-09-11 NOTE — Assessment & Plan Note (Signed)
 Doing well overall. Continue on Prozac, Use  Lorazepam prn  Potential benefits of a long term benzodiazepines  use as well as potential risks  and complications were explained to the patient and were aknowledged.

## 2023-09-12 ENCOUNTER — Telehealth: Payer: Self-pay

## 2023-09-12 NOTE — Telephone Encounter (Signed)
 Copied from CRM [id number]. Topic: Clinical - Medical Advice >> Sep 12, 2023  3:27 PM Burnard DEL wrote: Reason for CRM: patient called in stating that she has not received her covid vaccine,and would like advice if she should get it?  Her labs is showing that her iron Is low,and she is wondering what things she can do to help get her hemoglobin levels higher?

## 2023-09-13 NOTE — Addendum Note (Signed)
 Addended by: HEDDY IP R on: 09/13/2023 02:20 PM   Modules accepted: Orders

## 2023-09-14 NOTE — Telephone Encounter (Signed)
Meets, red apples, pomegranate are rich in iron. Personally, I would not get a COVID booster Thanks

## 2023-09-15 NOTE — Telephone Encounter (Signed)
Attempted to call pt and give her the providers response as follows "Meets, red apples, pomegranate are rich in iron. Personally, I would not get a COVID booster Thanks"

## 2023-09-15 NOTE — Telephone Encounter (Signed)
Copied from CRM [id number]. Topic: Clinical - Medical Advice >> Sep 15, 2023  1:14 PM Isabell A wrote: Reason for CRM: Patient states she was waiting for a call back in regard to her wondering what things she can do to help get her hemoglobin levels higher.

## 2023-09-16 ENCOUNTER — Other Ambulatory Visit: Payer: Self-pay | Admitting: Internal Medicine

## 2023-09-17 ENCOUNTER — Encounter: Payer: Self-pay | Admitting: Internal Medicine

## 2023-09-18 ENCOUNTER — Ambulatory Visit: Payer: Self-pay | Admitting: Internal Medicine

## 2023-09-18 NOTE — Telephone Encounter (Signed)
Copied from CRM [id number]. Topic: Clinical - Medication Question >> Sep 18, 2023 11:54 AM Samuel Jester B wrote: Reason for CRM: Pt stated that she has had a head cold, conjestion in her chest. She would like to request a callback to confirm if she needs to come in or her pcp can provide her some medication for her symptoms.   Chief Complaint: sinus congestion x 1 day Symptoms: tired and nasal congestion Frequency: intermittent Pertinent Negatives: Patient denies fever Disposition: [] ED /[] Urgent Care (no appt availability in office) / [] Appointment(In office/virtual)/ []  [facility]/ [x] Home Care/ [] Refused Recommended Disposition /[] [facility]/ []  Follow-up with PCP Additional Notes: Patient reports nasal congestion x 1 day. Says she had a cough yesterday but took Robitussin and felt better, but still having congestion and feeling tired. Home care advice given.    Reason for Disposition  [1] Sinus congestion as part of a cold AND [2] present < 10 days  Answer Assessment - Initial Assessment Questions 1. LOCATION: "Where does it hurt?"      No pain in sinuses  2. ONSET: "When did the sinus pain start?"  (e.g., hours, days)      Symptoms started yesterday morning  3. SEVERITY: "How bad is the pain?"   (Scale 1-10; mild, moderate or severe)   - MILD (1-3): doesn't interfere with normal activities    - MODERATE (4-7): interferes with normal activities (e.g., work or school) or awakens from sleep   - SEVERE (8-10): excruciating pain and patient unable to do any normal activities        Mild  4. RECURRENT SYMPTOM: "Have you ever had sinus problems before?" If Yes, ask: "When was the last time?" and "What happened that time?"      Yes, it was a sinus cinfection  5. NASAL CONGESTION: "Is the nose blocked?" If Yes, ask: "Can you open it or must you breathe through your mouth?"     yes 6. NASAL DISCHARGE: "Do you have discharge from your nose?" If so ask, "What color?"      Yellow mucus when blowing nose  7. FEVER: "Do you have a fever?" If Yes, ask: "What is it, how was it measured, and when did it start?"      No fever  8. OTHER SYMPTOMS: "Do you have any other symptoms?" (e.g., sore throat, cough, earache, difficulty breathing)     Weak, nasal congestion  9. PREGNANCY: "Is there any chance you are pregnant?" "When was your last menstrual period?"     no  Protocols used: Sinus Pain or Congestion-A-AH

## 2023-09-22 ENCOUNTER — Ambulatory Visit: Payer: Self-pay | Admitting: Internal Medicine

## 2023-09-22 NOTE — Telephone Encounter (Signed)
Copied from CRM [id number]. Topic: Clinical - Red Word Triage >> Sep 22, 2023 10:36 AM Pascal Lux wrote: Red Word that prompted transfer to Nurse Triage: Cold for a week, running nose, head congestion/ chest cough and thinks may be an infection. Coughing up yellow mucus in the morning.   Chief Complaint: Cough Symptoms: Cough, runny nose, head and chest congestion  Frequency: Constant  Pertinent Negatives: Patient denies fevers, shortness of breath Disposition: [] ED /[] Urgent Care (no appt availability in office) / [] Appointment(In office/virtual)/ []  [facility]/ [x] Home Care/ [] Refused Recommended Disposition /[]  Mobile Bus/ []  Follow-up with PCP Additional Notes: Patient reports that for the last 4 days she has been experiencing a cough, runny nose, as well as head and chest congestion. Patient has been taking Robitussin which has helped with cough. Patient reports that today she began to cough up yellow sputum. Patient denies any fevers or shortness of breath. Patient states that the last time she had this she was prescribed a cough medicine that helped her symptoms and was wondering if she could have something prescribed for her current cough. Advised patient that I would let the clinic know of her request. Patient given home care instructions and advised of when to call back or when to go to urgent care for her symptoms. Patient understood and is agreeable with this plan.      Reason for Disposition  Cough with cold symptoms (e.g., runny nose, postnasal drip, throat clearing)  Answer Assessment - Initial Assessment Questions 1. ONSET: "When did the cough begin?"      4 days ago 2. SEVERITY: "How bad is the cough today?"      Mild during day, moderate at night  3. SPUTUM: "Describe the color of your sputum" (none, dry cough; clear, white, yellow, green)     Yellow  4. HEMOPTYSIS: "Are you coughing up any blood?" If so ask: "How much?" (flecks, streaks, tablespoons,  etc.)     No 5. DIFFICULTY BREATHING: "Are you having difficulty breathing?" If Yes, ask: "How bad is it?" (e.g., mild, moderate, severe)    - MILD: No SOB at rest, mild SOB with walking, speaks normally in sentences, can lie down, no retractions, pulse < 100.    - MODERATE: SOB at rest, SOB with minimal exertion and prefers to sit, cannot lie down flat, speaks in phrases, mild retractions, audible wheezing, pulse 100-120.    - SEVERE: Very SOB at rest, speaks in single words, struggling to breathe, sitting hunched forward, retractions, pulse > 120      No 6. FEVER: "Do you have a fever?" If Yes, ask: "What is your temperature, how was it measured, and when did it start?"     Nothing recorded, felt feverish on the first 2 days  7. CARDIAC HISTORY: "Do you have any history of heart disease?" (e.g., heart attack, congestive heart failure)      No 8. LUNG HISTORY: "Do you have any history of lung disease?"  (e.g., pulmonary embolus, asthma, emphysema)     No 9. PE RISK FACTORS: "Do you have a history of blood clots?" (or: recent major surgery, recent prolonged travel, bedridden)     No 10. OTHER SYMPTOMS: "Do you have any other symptoms?" (e.g., runny nose, wheezing, chest pain)       Runny nose, chest congestion, head congestion  11. PREGNANCY: "Is there any chance you are pregnant?" "When was your last menstrual period?"       No 12. TRAVEL: "  Have you traveled out of the country in the last month?" (e.g., travel history, exposures)       No  Protocols used: Cough - Acute Productive-A-AH

## 2023-09-25 NOTE — Telephone Encounter (Signed)
Hope she is better by now.  Please see one of [place] if not well by now. Thanks

## 2023-09-26 NOTE — Telephone Encounter (Signed)
Lvm for pt to inform her of Dr. Loren Racer advice. "Hope she is better by now.  Please see one of [place] if not well by now. Thanks"

## 2023-11-12 ENCOUNTER — Other Ambulatory Visit: Payer: Self-pay | Admitting: Internal Medicine

## 2023-11-22 ENCOUNTER — Other Ambulatory Visit: Payer: Self-pay | Admitting: Internal Medicine

## 2023-11-22 DIAGNOSIS — Z1231 Encounter for screening mammogram for malignant neoplasm of breast: Secondary | ICD-10-CM | POA: Diagnosis not present

## 2023-11-22 LAB — HM MAMMOGRAPHY

## 2023-11-22 MED ORDER — PANTOPRAZOLE SODIUM 40 MG PO TBEC: DELAYED_RELEASE_TABLET | ORAL | 3 refills | Status: AC

## 2023-11-22 NOTE — Telephone Encounter (Signed)
 Copied from CRM [id number]. Topic: Clinical - Medication Refill >> Nov 22, 2023 11:50 AM Denese Killings wrote: Most Recent Primary Care Visit:  Provider: Tresa Garter  Department: [facility]  Visit Type: ACUTE  Date: 09/07/2023  Medication: pantoprazole (PROTONIX) 40 MG tablet   Has the patient contacted their pharmacy? No (Agent: If no, request that the patient contact the pharmacy for the refill. If patient does not wish to contact the pharmacy document the reason why and proceed with request.) (Agent: If yes, when and what did the pharmacy advise?) advised to call doctor for approval   Is this the correct pharmacy for this prescription? Yes If no, delete pharmacy and type the correct one.  This is the patient's preferred pharmacy:   [facility]) - [place], [place] - [address] [place] [address] [place] [address] Phone: [phone] Fax: [phone]   Has the prescription been filled recently? No  Is the patient out of the medication? No  Has the patient been seen for an appointment in the last year OR does the patient have an upcoming appointment? Yes  Can we respond through MyChart? Yes  Agent: Please be advised that Rx refills may take up to 3 business days. We ask that you follow-up with your pharmacy.

## 2023-11-23 ENCOUNTER — Encounter: Payer: Self-pay | Admitting: Internal Medicine

## 2023-12-06 ENCOUNTER — Other Ambulatory Visit: Payer: Self-pay | Admitting: Internal Medicine

## 2023-12-06 DIAGNOSIS — I739 Peripheral vascular disease, unspecified: Secondary | ICD-10-CM

## 2023-12-06 DIAGNOSIS — E785 Hyperlipidemia, unspecified: Secondary | ICD-10-CM

## 2023-12-08 ENCOUNTER — Telehealth: Payer: Self-pay | Admitting: Internal Medicine

## 2023-12-08 NOTE — Telephone Encounter (Signed)
 Copied from CRM [id number]. Topic: Clinical - Lab/Test Results >> Dec 07, 2023  4:13 PM Ann Held wrote: Reason for CRM: [employer] reached out regarding some PVD results of patients leg. Provider stated left leg results were normal and right leg result showed some mild finding. She stated that office will be sending over some info regarding this information. NP Romilda Garret [phone] is callback #

## 2024-01-31 ENCOUNTER — Encounter: Payer: Self-pay | Admitting: Internal Medicine

## 2024-01-31 ENCOUNTER — Ambulatory Visit: Admitting: Internal Medicine

## 2024-01-31 VITALS — BP 118/72 | HR 62 | Temp 98.2°F | Ht <= 58 in | Wt 127.0 lb

## 2024-01-31 DIAGNOSIS — I10 Essential (primary) hypertension: Secondary | ICD-10-CM

## 2024-01-31 DIAGNOSIS — G8929 Other chronic pain: Secondary | ICD-10-CM | POA: Diagnosis not present

## 2024-01-31 DIAGNOSIS — R739 Hyperglycemia, unspecified: Secondary | ICD-10-CM

## 2024-01-31 DIAGNOSIS — M25511 Pain in right shoulder: Secondary | ICD-10-CM

## 2024-01-31 MED ORDER — TRIAMCINOLONE ACETONIDE 0.5 % EX CREA: 1.0000 | TOPICAL_CREAM | Freq: Three times a day (TID) | CUTANEOUS | 1 refills | Status: AC

## 2024-01-31 NOTE — Patient Instructions (Signed)
 USEFUL THINGS FOR ARTHRITIS and musculoskeletal pains:    A "rice sock heating pad" refers to a homemade heating pad created by filling a sock with uncooked rice, which can be heated in a microwave to provide a warm compress for sore muscles, pain relief, or other applications; essentially, it's a simple way to generate heat using readily available materials.  Key points about rice sock heat: How to make it: Fill a clean sock (preferably a tube sock) about 2/3 full with uncooked rice, tie a knot at the top to secure the rice inside.  Heating it up: Place the rice sock in the microwave and heat in short intervals (usually around 30 seconds at a time) until it reaches the desired warmth.  Important considerations: Check temperature before applying: Always test the temperature of the rice sock before applying it to your skin to avoid burns.  Use a towel to protect skin: Wrap the rice sock in a thin towel to distribute the heat evenly and protect your skin.  Uses: Muscle aches and pains  Menstrual cramps  Neck pain  Arthritis discomfort      BLUE EMU CREAM: Use it 2-3 times a day on painful areas

## 2024-01-31 NOTE — Progress Notes (Signed)
 Subjective:  Patient ID: Sharon Paul, female    DOB: 1946-03-17  Age: 78 y.o. MRN: [id number]  CC: Medical Management of Chronic Issues (LT ear feels as if something is in it and rt shoulder pain after surgery and is wanting PT for it )   HPI Sharon Paul presents for L ear discomfort x weeks off and on C/o R shoulder pain - pt had surgery, PT after Follow-up appointment hypertension  Outpatient Medications Prior to Visit  Medication Sig Dispense Refill   acetaminophen  (TYLENOL ) 500 MG tablet Take 500 mg by mouth every 6 (six) hours as needed for moderate pain.     ALPRAZolam  (XANAX ) 0.25 MG tablet Take 1 tablet (0.25 mg total) by mouth 2 (two) times daily as needed for anxiety. 30 tablet 0   alum & mag hydroxide-simeth (MAALOX/MYLANTA) 200-200-20 MG/5ML suspension Take 15 mLs by mouth at bedtime as needed for indigestion or heartburn.     amLODipine  (NORVASC ) 10 MG tablet TAKE 1 TABLET BY MOUTH EVERY DAY 90 tablet 3   aspirin  81 MG tablet Take 81 mg by mouth daily.     Cholecalciferol 2000 UNITS TABS Take 2,000 Units by mouth daily.     diphenoxylate -atropine  (LOMOTIL ) 2.5-0.025 MG tablet Take 1 tablet by mouth 4 (four) times daily as needed for diarrhea or loose stools. 60 tablet 1   estradiol  (ESTRACE ) 0.1 MG/GM vaginal cream Insert 1gm vaginally twice weekly. 42.5 g 3   Evolocumab  (REPATHA  SURECLICK) 140 MG/ML SOAJ Inject 140 mg into the skin every 14 (fourteen) days. Needs apt for more refills 6 mL 0   fexofenadine (ALLEGRA) 180 MG tablet Take 180 mg by mouth daily.     FLUoxetine  (PROZAC ) 10 MG capsule TAKE 1 CAPSULE BY MOUTH DAILY 90 capsule 3   fluticasone  (FLONASE ) 50 MCG/ACT nasal spray Place 2 sprays into both nostrils daily as needed for allergies. 16 g 5   irbesartan  (AVAPRO ) 300 MG tablet TAKE 1/2 TABLETS (150 MG TOTAL) BY MOUTH 2 (TWO) TIMES DAILY. 90 tablet 1   labetalol  (NORMODYNE ) 300 MG tablet TAKE 1 TABLET BY MOUTH TWICE  DAILY 200 tablet 2    LORazepam  (ATIVAN ) 0.5 MG tablet TAKE 1 TABLET BY MOUTH TWICE A DAY 60 tablet 3   Multiple Vitamins-Minerals (MULTIVITAMIN WOMEN 50+) TABS Take 1 tablet by mouth daily.     pantoprazole  (PROTONIX ) 40 MG tablet TAKE 1 TABLET BY MOUTH  DAILY 90 tablet 3   rosuvastatin  (CRESTOR ) 5 MG tablet TAKE 1 TABLET BY MOUTH EVERY DAY 30 tablet 1   valACYclovir  (VALTREX ) 1000 MG tablet Take 2 tablets at the earliest onset of cold sore symptoms.  Repeat with 2 tablets 12 hours later 12 tablet 1   ipratropium (ATROVENT ) 0.06 % nasal spray PLACE 2 SPRAYS INTO THE NOSE 3 (THREE) TIMES DAILY. 30 mL 2   No facility-administered medications prior to visit.    ROS: Review of Systems  Constitutional:  Negative for activity change, appetite change, chills, fatigue and unexpected weight change.  HENT:  Negative for congestion, mouth sores and sinus pressure.   Eyes:  Negative for visual disturbance.  Respiratory:  Negative for cough and chest tightness.   Gastrointestinal:  Negative for abdominal pain and nausea.  Genitourinary:  Negative for difficulty urinating, frequency and vaginal pain.  Musculoskeletal:  Positive for arthralgias. Negative for back pain and gait problem.  Skin:  Negative for pallor and rash.  Neurological:  Negative for dizziness, tremors, weakness, numbness and headaches.  Psychiatric/Behavioral:  Negative for confusion and sleep disturbance.     Objective:  BP 118/72   Pulse 62   Temp 98.2 F (36.8 C) (Oral)   Ht 4' 8.75 (1.441 m)   Wt 127 lb (57.6 kg)   SpO2 96%   BMI 27.73 kg/m   BP Readings from Last 3 Encounters:  01/31/24 118/72  09/07/23 110/60  07/26/23 104/64    Wt Readings from Last 3 Encounters:  01/31/24 127 lb (57.6 kg)  09/07/23 124 lb 3.2 oz (56.3 kg)  07/26/23 124 lb (56.2 kg)    Physical Exam Constitutional:      General: She is not in acute distress.    Appearance: She is well-developed.  HENT:     Head: Normocephalic.     Right Ear: External ear  normal.     Left Ear: External ear normal.     Nose: Nose normal.   Eyes:     General:        Right eye: No discharge.        Left eye: No discharge.     Conjunctiva/sclera: Conjunctivae normal.     Pupils: Pupils are equal, round, and reactive to light.   Neck:     Thyroid : No thyromegaly.     Vascular: No JVD.     Trachea: No tracheal deviation.   Cardiovascular:     Rate and Rhythm: Normal rate and regular rhythm.     Heart sounds: Normal heart sounds.  Pulmonary:     Effort: No respiratory distress.     Breath sounds: No stridor. No wheezing.  Abdominal:     General: Bowel sounds are normal. There is no distension.     Palpations: Abdomen is soft. There is no mass.     Tenderness: There is no abdominal tenderness. There is no guarding or rebound.   Musculoskeletal:        General: No tenderness.     Cervical back: Normal range of motion and neck supple. No rigidity.  Lymphadenopathy:     Cervical: No cervical adenopathy.   Skin:    Findings: No erythema or rash.   Neurological:     Cranial Nerves: No cranial nerve deficit.     Motor: No abnormal muscle tone.     Coordination: Coordination normal.     Deep Tendon Reflexes: Reflexes normal.   Psychiatric:        Behavior: Behavior normal.        Thought Content: Thought content normal.        Judgment: Judgment normal.    R shoulder w/pain Lab Results  Component Value Date   WBC 3.9 (L) 09/11/2023   HGB 11.9 (L) 09/11/2023   HCT 36.2 09/11/2023   PLT 286.0 09/11/2023   GLUCOSE 112 (H) 09/11/2023   CHOL 157 02/14/2023   TRIG 106.0 02/14/2023   HDL 63.60 02/14/2023   LDLDIRECT 138.2 10/22/2010   LDLCALC 72 02/14/2023   ALT 14 09/11/2023   AST 17 09/11/2023   NA 136 09/11/2023   K 3.7 09/11/2023   CL 104 09/11/2023   CREATININE 0.76 09/11/2023   BUN 17 09/11/2023   CO2 27 09/11/2023   TSH 1.43 02/14/2023   HGBA1C 5.8 02/14/2023    US  Abdomen Complete Result Date: 06/13/2023 CLINICAL DATA:   Abdominal pain, diarrhea. EXAM: ABDOMEN ULTRASOUND COMPLETE COMPARISON:  None Available. FINDINGS: Gallbladder: No gallstones or wall thickening visualized. No pericholecystic fluid. Common bile duct: Diameter: 0.3cm. Liver: No focal lesion identified. Normal homogeneous  echogenicity. IVC: No abnormality visualized. Pancreas: Visualized portion unremarkable. Spleen: Size and appearance within normal limits. Right Kidney: Length: 10.0cm. Echogenicity within normal limits. No mass or hydronephrosis visualized. Left Kidney: Length: 10.8cm. Echogenicity within normal limits. No mass or hydronephrosis visualized. Abdominal aorta: No aneurysm visualized. IMPRESSION: Unremarkable examination of the abdomen. Electronically Signed   By: Sydell Eva M.D.   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 Use heat before exercising Blue-Emu cream was recommended to use 2-3 times a day S/p PT after surgery      Relevant Orders   Ambulatory referral to Physical Therapy      Meds ordered this encounter  Medications   triamcinolone  cream (KENALOG ) 0.5 %    Sig: Apply 1 Application topically 3 (three) times daily.    Dispense:  45 g    Refill:  1      Follow-up: Return in about 3 months (around 05/02/2024) for a follow-up visit.  Anitra Barn, MD

## 2024-01-31 NOTE — Assessment & Plan Note (Signed)
 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 Use heat before exercising Blue-Emu cream was recommended to use 2-3 times a day S/p PT after surgery

## 2024-02-11 NOTE — Assessment & Plan Note (Signed)
 Well controlled - Labetalol, Irbesartan, Norvasc Monitor BP

## 2024-02-11 NOTE — Assessment & Plan Note (Signed)
 Continue to monitor glucose.  Good diet

## 2024-02-28 ENCOUNTER — Other Ambulatory Visit: Payer: Self-pay | Admitting: Internal Medicine

## 2024-02-28 DIAGNOSIS — H02834 Dermatochalasis of left upper eyelid: Secondary | ICD-10-CM | POA: Diagnosis not present

## 2024-02-28 DIAGNOSIS — H02831 Dermatochalasis of right upper eyelid: Secondary | ICD-10-CM | POA: Diagnosis not present

## 2024-02-28 DIAGNOSIS — H401111 Primary open-angle glaucoma, right eye, mild stage: Secondary | ICD-10-CM | POA: Diagnosis not present

## 2024-02-28 DIAGNOSIS — H04123 Dry eye syndrome of bilateral lacrimal glands: Secondary | ICD-10-CM | POA: Diagnosis not present

## 2024-02-28 DIAGNOSIS — E785 Hyperlipidemia, unspecified: Secondary | ICD-10-CM

## 2024-02-28 DIAGNOSIS — I739 Peripheral vascular disease, unspecified: Secondary | ICD-10-CM

## 2024-02-28 DIAGNOSIS — H43812 Vitreous degeneration, left eye: Secondary | ICD-10-CM | POA: Diagnosis not present

## 2024-02-28 DIAGNOSIS — H25813 Combined forms of age-related cataract, bilateral: Secondary | ICD-10-CM | POA: Diagnosis not present

## 2024-02-28 DIAGNOSIS — H40022 Open angle with borderline findings, high risk, left eye: Secondary | ICD-10-CM | POA: Diagnosis not present

## 2024-03-18 DIAGNOSIS — R293 Abnormal posture: Secondary | ICD-10-CM | POA: Diagnosis not present

## 2024-03-18 DIAGNOSIS — M25511 Pain in right shoulder: Secondary | ICD-10-CM | POA: Diagnosis not present

## 2024-03-18 DIAGNOSIS — M62511 Muscle wasting and atrophy, not elsewhere classified, right shoulder: Secondary | ICD-10-CM | POA: Diagnosis not present

## 2024-03-18 DIAGNOSIS — M25611 Stiffness of right shoulder, not elsewhere classified: Secondary | ICD-10-CM | POA: Diagnosis not present

## 2024-03-26 DIAGNOSIS — M25511 Pain in right shoulder: Secondary | ICD-10-CM | POA: Diagnosis not present

## 2024-03-26 DIAGNOSIS — M25611 Stiffness of right shoulder, not elsewhere classified: Secondary | ICD-10-CM | POA: Diagnosis not present

## 2024-03-26 DIAGNOSIS — R293 Abnormal posture: Secondary | ICD-10-CM | POA: Diagnosis not present

## 2024-03-26 DIAGNOSIS — M62511 Muscle wasting and atrophy, not elsewhere classified, right shoulder: Secondary | ICD-10-CM | POA: Diagnosis not present

## 2024-04-10 DIAGNOSIS — R293 Abnormal posture: Secondary | ICD-10-CM | POA: Diagnosis not present

## 2024-04-10 DIAGNOSIS — M25611 Stiffness of right shoulder, not elsewhere classified: Secondary | ICD-10-CM | POA: Diagnosis not present

## 2024-04-10 DIAGNOSIS — M62511 Muscle wasting and atrophy, not elsewhere classified, right shoulder: Secondary | ICD-10-CM | POA: Diagnosis not present

## 2024-04-10 DIAGNOSIS — M25511 Pain in right shoulder: Secondary | ICD-10-CM | POA: Diagnosis not present

## 2024-04-16 DIAGNOSIS — R293 Abnormal posture: Secondary | ICD-10-CM | POA: Diagnosis not present

## 2024-04-16 DIAGNOSIS — M62511 Muscle wasting and atrophy, not elsewhere classified, right shoulder: Secondary | ICD-10-CM | POA: Diagnosis not present

## 2024-04-16 DIAGNOSIS — M25511 Pain in right shoulder: Secondary | ICD-10-CM | POA: Diagnosis not present

## 2024-04-16 DIAGNOSIS — M25611 Stiffness of right shoulder, not elsewhere classified: Secondary | ICD-10-CM | POA: Diagnosis not present

## 2024-04-22 DIAGNOSIS — M62511 Muscle wasting and atrophy, not elsewhere classified, right shoulder: Secondary | ICD-10-CM | POA: Diagnosis not present

## 2024-04-22 DIAGNOSIS — M25511 Pain in right shoulder: Secondary | ICD-10-CM | POA: Diagnosis not present

## 2024-04-22 DIAGNOSIS — M25611 Stiffness of right shoulder, not elsewhere classified: Secondary | ICD-10-CM | POA: Diagnosis not present

## 2024-04-22 DIAGNOSIS — R293 Abnormal posture: Secondary | ICD-10-CM | POA: Diagnosis not present

## 2024-04-23 ENCOUNTER — Encounter: Admitting: Internal Medicine

## 2024-04-23 ENCOUNTER — Ambulatory Visit: Admitting: Internal Medicine

## 2024-04-25 DIAGNOSIS — M62511 Muscle wasting and atrophy, not elsewhere classified, right shoulder: Secondary | ICD-10-CM | POA: Diagnosis not present

## 2024-04-25 DIAGNOSIS — M25511 Pain in right shoulder: Secondary | ICD-10-CM | POA: Diagnosis not present

## 2024-04-25 DIAGNOSIS — R293 Abnormal posture: Secondary | ICD-10-CM | POA: Diagnosis not present

## 2024-04-25 DIAGNOSIS — M25611 Stiffness of right shoulder, not elsewhere classified: Secondary | ICD-10-CM | POA: Diagnosis not present

## 2024-05-10 ENCOUNTER — Other Ambulatory Visit: Payer: Self-pay | Admitting: Internal Medicine

## 2024-05-15 ENCOUNTER — Ambulatory Visit: Admitting: Internal Medicine

## 2024-05-15 ENCOUNTER — Encounter: Payer: Self-pay | Admitting: Internal Medicine

## 2024-05-15 VITALS — BP 134/70 | HR 57 | Temp 98.0°F | Ht 59.0 in | Wt 128.4 lb

## 2024-05-15 DIAGNOSIS — Z23 Encounter for immunization: Secondary | ICD-10-CM | POA: Diagnosis not present

## 2024-05-15 DIAGNOSIS — E785 Hyperlipidemia, unspecified: Secondary | ICD-10-CM

## 2024-05-15 DIAGNOSIS — R739 Hyperglycemia, unspecified: Secondary | ICD-10-CM | POA: Diagnosis not present

## 2024-05-15 DIAGNOSIS — E739 Lactose intolerance, unspecified: Secondary | ICD-10-CM | POA: Diagnosis not present

## 2024-05-15 DIAGNOSIS — R140 Abdominal distension (gaseous): Secondary | ICD-10-CM | POA: Diagnosis not present

## 2024-05-15 DIAGNOSIS — Z1211 Encounter for screening for malignant neoplasm of colon: Secondary | ICD-10-CM | POA: Diagnosis not present

## 2024-05-15 DIAGNOSIS — K580 Irritable bowel syndrome with diarrhea: Secondary | ICD-10-CM

## 2024-05-15 LAB — COMPREHENSIVE METABOLIC PANEL WITH GFR
ALT: 16 U/L (ref 0–35)
AST: 19 U/L (ref 0–37)
Albumin: 4.6 g/dL (ref 3.5–5.2)
Alkaline Phosphatase: 82 U/L (ref 39–117)
BUN: 15 mg/dL (ref 6–23)
CO2: 24 meq/L (ref 19–32)
Calcium: 9.7 mg/dL (ref 8.4–10.5)
Chloride: 103 meq/L (ref 96–112)
Creatinine, Ser: 0.75 mg/dL (ref 0.40–1.20)
GFR: 76.71 mL/min (ref >60.00)
Glucose, Bld: 91 mg/dL (ref 70–99)
Potassium: 4.2 meq/L (ref 3.5–5.1)
Sodium: 137 meq/L (ref 135–145)
Total Bilirubin: 0.4 mg/dL (ref 0.2–1.2)
Total Protein: 6.8 g/dL (ref 6.0–8.3)

## 2024-05-15 LAB — HEMOGLOBIN A1C: Hgb A1c MFr Bld: 6.4 % (ref 4.6–6.5)

## 2024-05-15 NOTE — Assessment & Plan Note (Signed)
 Try Lactaid Try A2 Milk

## 2024-05-15 NOTE — Patient Instructions (Signed)
 Try Lactaid Try A2 Milk

## 2024-05-15 NOTE — Progress Notes (Signed)
 Subjective:  Patient ID: Sharon Paul, female    DOB: 04-10-1946  Age: 78 y.o. MRN: [id number]  CC: Follow-up (Patient having problems with her stomach still. States she has diarrhea today. Wants Flu vaccine. Patient thinks it is a lactose problem that is bothering her. )   HPI Sharon Paul presents for possible elevated glucose C/o probable lactose intolerance  Outpatient Medications Prior to Visit  Medication Sig Dispense Refill   acetaminophen  (TYLENOL ) 500 MG tablet Take 500 mg by mouth every 6 (six) hours as needed for moderate pain.     ALPRAZolam  (XANAX ) 0.25 MG tablet Take 1 tablet (0.25 mg total) by mouth 2 (two) times daily as needed for anxiety. 30 tablet 0   alum & mag hydroxide-simeth (MAALOX/MYLANTA) 200-200-20 MG/5ML suspension Take 15 mLs by mouth at bedtime as needed for indigestion or heartburn.     amLODipine  (NORVASC ) 10 MG tablet TAKE 1 TABLET BY MOUTH EVERY DAY 90 tablet 3   aspirin  81 MG tablet Take 81 mg by mouth daily.     Cholecalciferol 2000 UNITS TABS Take 2,000 Units by mouth daily.     diphenoxylate -atropine  (LOMOTIL ) 2.5-0.025 MG tablet Take 1 tablet by mouth 4 (four) times daily as needed for diarrhea or loose stools. 60 tablet 1   estradiol  (ESTRACE ) 0.1 MG/GM vaginal cream Insert 1gm vaginally twice weekly. 42.5 g 3   Evolocumab  (REPATHA  SURECLICK) 140 MG/ML SOAJ Inject 140 mg into the skin every 14 (fourteen) days. Needs apt for more refills 6 mL 0   fexofenadine (ALLEGRA) 180 MG tablet Take 180 mg by mouth daily.     fluticasone  (FLONASE ) 50 MCG/ACT nasal spray Place 2 sprays into both nostrils daily as needed for allergies. 16 g 5   ipratropium (ATROVENT ) 0.06 % nasal spray PLACE 2 SPRAYS INTO THE NOSE 3 (THREE) TIMES DAILY. 30 mL 2   irbesartan  (AVAPRO ) 300 MG tablet TAKE 1/2 TABLETS (150 MG TOTAL) BY MOUTH 2 (TWO) TIMES DAILY. 90 tablet 1   labetalol  (NORMODYNE ) 300 MG tablet TAKE 1 TABLET BY MOUTH TWICE  DAILY 200 tablet 2    LORazepam  (ATIVAN ) 0.5 MG tablet TAKE 1 TABLET BY MOUTH TWICE A DAY 60 tablet 3   Multiple Vitamins-Minerals (MULTIVITAMIN WOMEN 50+) TABS Take 1 tablet by mouth daily.     pantoprazole  (PROTONIX ) 40 MG tablet TAKE 1 TABLET BY MOUTH  DAILY 90 tablet 3   rosuvastatin  (CRESTOR ) 5 MG tablet TAKE 1 TABLET BY MOUTH EVERY DAY 30 tablet 1   triamcinolone  cream (KENALOG ) 0.5 % Apply 1 Application topically 3 (three) times daily. 45 g 1   valACYclovir  (VALTREX ) 1000 MG tablet Take 2 tablets at the earliest onset of cold sore symptoms.  Repeat with 2 tablets 12 hours later 12 tablet 1   FLUoxetine  (PROZAC ) 10 MG capsule TAKE 1 CAPSULE BY MOUTH DAILY (Patient not taking: Reported on 05/15/2024) 90 capsule 3   No facility-administered medications prior to visit.    ROS: Review of Systems  Constitutional:  Negative for activity change, appetite change, chills, fatigue and unexpected weight change.  HENT:  Negative for congestion, mouth sores and sinus pressure.   Eyes:  Negative for visual disturbance.  Respiratory:  Negative for cough and chest tightness.   Gastrointestinal:  Positive for diarrhea. Negative for abdominal pain and nausea.  Genitourinary:  Negative for difficulty urinating, frequency and vaginal pain.  Musculoskeletal:  Negative for back pain and gait problem.  Skin:  Negative for pallor and rash.  Neurological:  Negative for dizziness, tremors, weakness, numbness and headaches.  Psychiatric/Behavioral:  Negative for confusion and sleep disturbance.     Objective:  BP 134/70   Pulse (!) 57   Temp 98 F (36.7 C) (Oral)   Ht 4' 11 (1.499 m)   Wt 128 lb 6.4 oz (58.2 kg)   SpO2 97%   BMI 25.93 kg/m   BP Readings from Last 3 Encounters:  05/15/24 134/70  01/31/24 118/72  09/07/23 110/60    Wt Readings from Last 3 Encounters:  05/15/24 128 lb 6.4 oz (58.2 kg)  01/31/24 127 lb (57.6 kg)  09/07/23 124 lb 3.2 oz (56.3 kg)    Physical Exam Constitutional:      General: She  is not in acute distress.    Appearance: She is well-developed.  HENT:     Head: Normocephalic.     Right Ear: External ear normal.     Left Ear: External ear normal.     Nose: Nose normal.  Eyes:     General:        Right eye: No discharge.        Left eye: No discharge.     Conjunctiva/sclera: Conjunctivae normal.     Pupils: Pupils are equal, round, and reactive to light.  Neck:     Thyroid : No thyromegaly.     Vascular: No JVD.     Trachea: No tracheal deviation.  Cardiovascular:     Rate and Rhythm: Normal rate and regular rhythm.     Heart sounds: Normal heart sounds.  Pulmonary:     Effort: No respiratory distress.     Breath sounds: No stridor. No wheezing.  Abdominal:     General: Bowel sounds are normal. There is no distension.     Palpations: Abdomen is soft. There is no mass.     Tenderness: There is no abdominal tenderness. There is no guarding or rebound.  Musculoskeletal:        General: No tenderness.     Cervical back: Normal range of motion and neck supple. No rigidity.     Right lower leg: No edema.     Left lower leg: No edema.  Lymphadenopathy:     Cervical: No cervical adenopathy.  Skin:    Findings: No erythema or rash.  Neurological:     Mental Status: She is oriented to person, place, and time.     Cranial Nerves: No cranial nerve deficit.     Motor: No abnormal muscle tone.     Coordination: Coordination normal.     Deep Tendon Reflexes: Reflexes normal.  Psychiatric:        Behavior: Behavior normal.        Thought Content: Thought content normal.        Judgment: Judgment normal.     Lab Results  Component Value Date   WBC 3.9 (L) 09/11/2023   HGB 11.9 (L) 09/11/2023   HCT 36.2 09/11/2023   PLT 286.0 09/11/2023   GLUCOSE 112 (H) 09/11/2023   CHOL 157 02/14/2023   TRIG 106.0 02/14/2023   HDL 63.60 02/14/2023   LDLDIRECT 138.2 10/22/2010   LDLCALC 72 02/14/2023   ALT 14 09/11/2023   AST 17 09/11/2023   NA 136 09/11/2023   K 3.7  09/11/2023   CL 104 09/11/2023   CREATININE 0.76 09/11/2023   BUN 17 09/11/2023   CO2 27 09/11/2023   TSH 1.43 02/14/2023   HGBA1C 5.8 02/14/2023    US  Abdomen Complete Result  Date: 06/13/2023 CLINICAL DATA:  Abdominal pain, diarrhea. EXAM: ABDOMEN ULTRASOUND COMPLETE COMPARISON:  None Available. FINDINGS: Gallbladder: No gallstones or wall thickening visualized. No pericholecystic fluid. Common bile duct: Diameter: 0.3cm. Liver: No focal lesion identified. Normal homogeneous echogenicity. IVC: No abnormality visualized. Pancreas: Visualized portion unremarkable. Spleen: Size and appearance within normal limits. Right Kidney: Length: 10.0cm. Echogenicity within normal limits. No mass or hydronephrosis visualized. Left Kidney: Length: 10.8cm. Echogenicity within normal limits. No mass or hydronephrosis visualized. Abdominal aorta: No aneurysm visualized. IMPRESSION: Unremarkable examination of the abdomen. Electronically Signed   By: Fonda Field M.D.   On: 06/13/2023 18:54    Assessment & Plan:   Problem List Items Addressed This Visit     Bloating - Primary   Try Lactaid Try A2 Milk      Dyslipidemia   On Repatha  Check Lipids      Hyperglycemia   Check A1c, CMet      Relevant Orders   Comprehensive metabolic panel with GFR   Hemoglobin A1c   Irritable bowel syndrome   Try Lactaid Try A2 Milk      Lactose intolerance in adult   Try Lactaid Try A2 Milk      Screening for colon cancer   Relevant Orders   Cologuard      No orders of the defined types were placed in this encounter.     Follow-up: Return in about 6 months (around 11/12/2024) for Wellness Exam.  Marolyn Noel, MD

## 2024-05-15 NOTE — Assessment & Plan Note (Signed)
 Check A1c, CMet

## 2024-05-15 NOTE — Assessment & Plan Note (Signed)
On Repatha Check Lipids

## 2024-05-15 NOTE — Addendum Note (Signed)
 Addended by: BEVELY CURTISTINE PARAS on: 05/15/2024 02:01 PM   Modules accepted: Orders

## 2024-05-20 ENCOUNTER — Ambulatory Visit: Payer: Self-pay | Admitting: Internal Medicine

## 2024-05-21 ENCOUNTER — Telehealth: Payer: Self-pay | Admitting: Internal Medicine

## 2024-05-21 ENCOUNTER — Other Ambulatory Visit (HOSPITAL_COMMUNITY): Payer: Self-pay

## 2024-05-21 MED ORDER — REPATHA SURECLICK 140 MG/ML ~~LOC~~ SOAJ
140.0000 mg | SUBCUTANEOUS | 2 refills | Status: DC
  Filled 2024-05-21: qty 2, 28d supply, fill #0

## 2024-05-21 NOTE — Telephone Encounter (Signed)
*  STAT* If patient is at the pharmacy, call can be transferred to refill team.   1. Which medications need to be refilled? (please list name of each medication and dose if known) Evolocumab  (REPATHA  SURECLICK) 140 MG/ML SOAJ    2. Would you like to learn more about the convenience, safety, & potential cost savings by using the [facility]? No   3. Are you open to using the [facility] (Type [facility]. No    4. Which pharmacy/location (including street and city if local pharmacy) is medication to be sent to? [id number] - [place], [place] - [address]     5. Do they need a 30 day or 90 day supply? 90 day    Pt is out of medication.  Pt has appt schedule 07/22/24

## 2024-05-28 DIAGNOSIS — Z1211 Encounter for screening for malignant neoplasm of colon: Secondary | ICD-10-CM | POA: Diagnosis not present

## 2024-05-30 ENCOUNTER — Other Ambulatory Visit (HOSPITAL_COMMUNITY): Payer: Self-pay

## 2024-05-31 ENCOUNTER — Other Ambulatory Visit: Payer: Self-pay | Admitting: Internal Medicine

## 2024-05-31 DIAGNOSIS — I739 Peripheral vascular disease, unspecified: Secondary | ICD-10-CM

## 2024-05-31 DIAGNOSIS — E785 Hyperlipidemia, unspecified: Secondary | ICD-10-CM

## 2024-05-31 DIAGNOSIS — I6522 Occlusion and stenosis of left carotid artery: Secondary | ICD-10-CM

## 2024-06-03 LAB — COLOGUARD: COLOGUARD: NEGATIVE

## 2024-06-05 MED ORDER — REPATHA SURECLICK 140 MG/ML ~~LOC~~ SOAJ: 140.0000 mg | SUBCUTANEOUS | 1 refills | Status: DC

## 2024-06-05 NOTE — Addendum Note (Signed)
 Addended by: DRENA MARTINIS, Mung Rinker L on: 06/05/2024 11:50 AM   Modules accepted: Orders

## 2024-06-05 NOTE — Telephone Encounter (Signed)
 Refill has been sent. Patient has appointment with Dr. Okey in November. Okay per Chris Pavero, pharmacist to refill.

## 2024-06-05 NOTE — Telephone Encounter (Signed)
 Pt is calling back to checking the status of her Repatha  Refill. Please advise

## 2024-06-13 ENCOUNTER — Telehealth: Payer: Self-pay | Admitting: Internal Medicine

## 2024-06-13 ENCOUNTER — Other Ambulatory Visit: Payer: Self-pay | Admitting: Internal Medicine

## 2024-06-13 MED ORDER — LABETALOL HCL 300 MG PO TABS: 300.0000 mg | ORAL_TABLET | Freq: Two times a day (BID) | ORAL | 2 refills | Status: AC

## 2024-06-13 NOTE — Telephone Encounter (Signed)
 Copied from CRM #[id number]. Topic: Clinical - Medication Question >> Jun 13, 2024 12:08 PM Ashley R wrote: Reason for CRM: Did not receive labetalol  (NORMODYNE ) 300 MG tablet in time for a trip today through Monday. Refill request to alternative pharmacy sent, would like advice on if 2 days without taking medication will be OK.

## 2024-06-13 NOTE — Telephone Encounter (Signed)
 Copied from CRM #[id number]. Topic: Clinical - Medication Refill >> Jun 13, 2024 12:02 PM Ashley R wrote: Medication: labetalol  (NORMODYNE ) 300 MG tablet  Has the patient contacted their pharmacy? No - Mail order has not arrived and would [facility].   This is the patient's preferred pharmacy:  [facility] [address], [place] - [address] [place] [address] Phone: [phone] Fax: [phone]   Is this the correct pharmacy for this prescription? Yes If no, delete pharmacy and type the correct one.   Has the prescription been filled recently? Yes  Is the patient out of the medication? Yes - 1 day left, leaving for next 3 days.  Has the patient been seen for an appointment in the last year OR does the patient have an upcoming appointment? Yes  Can we respond through MyChart? Yes  Agent: Please be advised that Rx refills may take up to 3 business days. We ask that you follow-up with your pharmacy.

## 2024-06-13 NOTE — Telephone Encounter (Signed)
 Refill sent today to    [id number] - [place], [place] - [address]

## 2024-06-27 ENCOUNTER — Other Ambulatory Visit: Payer: Self-pay | Admitting: Internal Medicine

## 2024-07-01 ENCOUNTER — Other Ambulatory Visit: Payer: Self-pay | Admitting: Internal Medicine

## 2024-07-20 ENCOUNTER — Other Ambulatory Visit: Payer: Self-pay | Admitting: Internal Medicine

## 2024-07-22 ENCOUNTER — Encounter: Payer: Self-pay | Admitting: Internal Medicine

## 2024-07-22 ENCOUNTER — Other Ambulatory Visit: Payer: Self-pay

## 2024-07-22 ENCOUNTER — Ambulatory Visit: Attending: Internal Medicine | Admitting: Internal Medicine

## 2024-07-22 VITALS — BP 114/64 | HR 57 | Ht 59.0 in | Wt 129.2 lb

## 2024-07-22 DIAGNOSIS — E785 Hyperlipidemia, unspecified: Secondary | ICD-10-CM | POA: Diagnosis not present

## 2024-07-22 NOTE — Patient Instructions (Signed)
 Medication Instructions:  Your physician recommends that you continue on your current medications as directed. Please refer to the Current Medication list given to you today.  *If you need a refill on your cardiac medications before your next appointment, please call your pharmacy*  Lab Work: NMR, LpA, and HgbA1c - please go to any [facility] location to have these drawn  Follow-Up: At [facility], you and your health needs are our priority.  As part of our continuing mission to provide you with exceptional heart care, our providers are all part of one team.  This team includes your primary Cardiologist (physician) and Advanced Practice Providers or APPs (Physician Assistants and Nurse Practitioners) who all work together to provide you with the care you need, when you need it.  Your next appointment:   1 year  Provider:   Vina Gull, MD

## 2024-07-22 NOTE — Progress Notes (Signed)
 Cardiology Office Note   Date:  07/22/2024   ID:  Sharon Paul, DOB [birth year], MRN [id number]  PCP:  Sharon Karlynn GAILS, MD  Cardiologist:   Sharon Gull, MD    Pt presents for eval/follow up of CV dz and HL    History of Present Illness: Sharon Paul is a 78 y.o. female with a history of  PAD, GERD, HL, GERD, sprue   She had mild coronary calcifications and calcifications of aorta on CT in 2007      She has had problems with statins due to achiness  The pt has elevated velocities in L carotid by USN due to kink in vessel (see below)      I saw the pt in Jan 2024 Since seen denies CP  Breathing is good    Goes to [employer] 3x per week   1 mile  Br:   Toast   Egg   Coffee with sweet cream Lunch:Chic fil a nuggets   Mac and cheese   1/2 sweet/unsweet    Dinner   Chicken   FIsh  Rice a little  Salad   Veggies    Water     Current Meds  Medication Sig   acetaminophen  (TYLENOL ) 500 MG tablet Take 500 mg by mouth every 6 (six) hours as needed for moderate pain.   ALPRAZolam  (XANAX ) 0.25 MG tablet Take 1 tablet (0.25 mg total) by mouth 2 (two) times daily as needed for anxiety.   alum & mag hydroxide-simeth (MAALOX/MYLANTA) 200-200-20 MG/5ML suspension Take 15 mLs by mouth at bedtime as needed for indigestion or heartburn.   amLODipine  (NORVASC ) 10 MG tablet TAKE 1 TABLET BY MOUTH EVERY DAY   aspirin  81 MG tablet Take 81 mg by mouth daily.   Cholecalciferol 2000 UNITS TABS Take 2,000 Units by mouth daily.   diphenoxylate -atropine  (LOMOTIL ) 2.5-0.025 MG tablet Take 1 tablet by mouth 4 (four) times daily as needed for diarrhea or loose stools.   estradiol  (ESTRACE ) 0.1 MG/GM vaginal cream Insert 1gm vaginally twice weekly.   Evolocumab  (REPATHA  SURECLICK) 140 MG/ML SOAJ INJECT 140 MG INTO THE SKIN EVERY 14 (FOURTEEN) DAYS. PLEASE KEEP YOUR UPCOMING APPOINTMENT WITH DR. Kennesha Brewbaker FOR FURTHER REFILLS.   fexofenadine (ALLEGRA) 180 MG tablet Take 180 mg by mouth daily.    FLUoxetine  (PROZAC ) 10 MG capsule TAKE 1 CAPSULE BY MOUTH DAILY   fluticasone  (FLONASE ) 50 MCG/ACT nasal spray Place 2 sprays into both nostrils daily as needed for allergies.   ipratropium (ATROVENT ) 0.06 % nasal spray PLACE 2 SPRAYS INTO THE NOSE 3 (THREE) TIMES DAILY.   irbesartan  (AVAPRO ) 300 MG tablet TAKE 1/2 TABLETS (150 MG TOTAL) BY MOUTH 2 (TWO) TIMES DAILY.   labetalol  (NORMODYNE ) 300 MG tablet Take 1 tablet (300 mg total) by mouth 2 (two) times daily.   LORazepam  (ATIVAN ) 0.5 MG tablet TAKE 1 TABLET BY MOUTH TWICE A DAY   Multiple Vitamins-Minerals (MULTIVITAMIN WOMEN 50+) TABS Take 1 tablet by mouth daily.   pantoprazole  (PROTONIX ) 40 MG tablet TAKE 1 TABLET BY MOUTH  DAILY   rosuvastatin  (CRESTOR ) 5 MG tablet TAKE 1 TABLET BY MOUTH EVERY DAY   triamcinolone  cream (KENALOG ) 0.5 % Apply 1 Application topically 3 (three) times daily.   valACYclovir  (VALTREX ) 1000 MG tablet Take 2 tablets at the earliest onset of cold sore symptoms.  Repeat with 2 tablets 12 hours later     Allergies:   Augmentin  [amoxicillin -pot clavulanate], Clarithromycin, Codeine , Esomeprazole magnesium, Hydrochlorothiazide , Iohexol, Losartan potassium, Meloxicam , Olmesartan medoxomil, Ramipril,  Risedronate sodium, Tizanidine , and Kapidex [dexlansoprazole]   Past Medical History:  Diagnosis Date   Anxiety    Arthritis    Celiac sprue 2007   CVD (cardiovascular disease)    Foot fracture, left 2011   GERD (gastroesophageal reflux disease)    HSV infection    oral   Hyperlipidemia    Hypertension    Osteopenia 04/2019   T score -1.9 FRAX 10% / 1.9%.  Stable from prior DEXA   Pre-diabetes    PVD (peripheral vascular disease)    mild carotid ? fibromuscular dysplasia   Shingles    Urinary incontinence     Past Surgical History:  Procedure Laterality Date   APPENDECTOMY     BREAST SURGERY     right breast biopsy   ESOPHAGOGASTRODUODENOSCOPY     REVERSE SHOULDER ARTHROPLASTY Right 07/15/2022    Procedure: REVERSE SHOULDER ARTHROPLASTY;  Surgeon: Melita Drivers, MD;  Location: [facility];  Service: Orthopedics;  Laterality: Right;    ROTATOR CUFF REPAIR     RIGHT   ROTATOR CUFF REPAIR     Left   stress cardiolite  04-18-00   VAGINAL HYSTERECTOMY  1993   leiomyomata     Social History:  The patient  reports that she quit smoking about 54 years ago. Her smoking use included cigarettes. She has never used smokeless tobacco. She reports that she does not currently use alcohol. She reports that she does not use drugs.   Family History:  The patient's family history includes Glaucoma in her brother and mother; Heart disease in her father and mother; Hypertension in her brother, father, mother, paternal grandmother, and sister; Lung cancer in her paternal grandmother; Stroke in her father.    ROS:  Please see the history of present illness. All other systems are reviewed and  Negative to the above problem except as noted.    PHYSICAL EXAM: VS:  BP 114/64   Pulse (!) 57   Ht 4' 11 (1.499 m)   Wt 129 lb 3.2 oz (58.6 kg)   SpO2 97%   BMI 26.10 kg/m   GEN: Well nourished, well developed, in no acute distress  HEENT: normal  Neck: no JVD Cardiac: RRR; no murmur  Respiratory:  clear to auscultation  GI: soft, nontender, No hepatomegaly  Ext  No LE edema    EKG:  EKG is not ordered    Carotid USN 06/06/22  Right Carotid: There is no evidence of stenosis in the right ICA. The extracranial vessels were near-normal with only minimal wall thickening or plaque. Left Carotid: Velocities in the left ICA are consistent with a 40-59% stenosis. Non-hemodynamically significant plaque <50% noted in the CCA. Stable elevated velocities with no obvious plaque seen in the ICA suggesting 40-59% stenosis; elevated velocities in the proximal/mid segment due to kink in the vessel, and a steep posterior dive. See images 46-49. Vertebrals: Bilateral vertebral arteries demonstrate antegrade  flow. Subclavians: Bilateral subclavian artery flow was disturbed.  Heterogeneous plaque is noted    Lipid Panel    Component Value Date/Time   CHOL 157 02/14/2023 1013   CHOL 138 03/15/2021 1001   TRIG 106.0 02/14/2023 1013   TRIG 131 08/02/2006 0853   HDL 63.60 02/14/2023 1013   HDL 74 03/15/2021 1001   CHOLHDL 2 02/14/2023 1013   VLDL 21.2 02/14/2023 1013   LDLCALC 72 02/14/2023 1013   LDLCALC 47 03/15/2021 1001   LDLCALC 111 (H) 04/16/2020 0927   LDLDIRECT 138.2 10/22/2010 0944  Wt Readings from Last 3 Encounters:  07/22/24 129 lb 3.2 oz (58.6 kg)  05/15/24 128 lb 6.4 oz (58.2 kg)  01/31/24 127 lb (57.6 kg)      ASSESSMENT AND PLAN:  1  CV dz  USN of carotid signfi for higher velocities in L ICA due to kink  Last USN in 2023   Will follow periodically over time   2 Atherosclerosis Calcifications on CT scan   Pt remains asymptomatic      Rx risk factors   3  HLCurrently on Repatha  and Crestor  5 mg    Follow up NMR panel   4  HTN  BP remains well controlled     Continue amlodipine , avapro , labetalol     5  Metabolics  A1C was 6.4  Reviewed diet  Cut out SSB and processed carbs   Pt will work on diet  Get labs in a few months  Plan for f/u in cliic in 1 year  Increase activity     Current medicines are reviewed at length with the patient today.  The patient does not have concerns regarding medicines.  Signed, Sharon Gull, MD  07/22/2024 4:59 PM    [facility] [address] [place], [place], [place]  [address] Phone: [phone]; Fax: [phone]

## 2024-07-22 NOTE — Telephone Encounter (Signed)
 Has apt today. Will make sure she comes

## 2024-07-25 ENCOUNTER — Other Ambulatory Visit: Payer: Self-pay | Admitting: Internal Medicine

## 2024-09-24 ENCOUNTER — Other Ambulatory Visit: Payer: Self-pay | Admitting: Nurse Practitioner

## 2024-09-24 DIAGNOSIS — N952 Postmenopausal atrophic vaginitis: Secondary | ICD-10-CM

## 2024-09-24 NOTE — Telephone Encounter (Signed)
 Med refill request:   estradiol  (ESTRACE ) 0.1 MG/GM vaginal cream  Start:  07/26/23 Disp: 42.5 g Refills:  3  Last AEX:  07/26/23 Next AEX:  Not yet scheduled *Front desk has been asked to contact Pt to schedule an annual visit.  Last MMG (if hormonal med):  11/22/23 Refill authorized? Please Advise.
